# Patient Record
Sex: Male | Born: 1945 | Race: White | Hispanic: No | Marital: Single | Smoking: Former smoker
Health system: Southern US, Community
[De-identification: ages and names within clinical notes are randomized; demographics above are authoritative.]

## PROBLEM LIST (undated history)

## (undated) DIAGNOSIS — I10 Essential (primary) hypertension: Secondary | ICD-10-CM

## (undated) DIAGNOSIS — J449 Chronic obstructive pulmonary disease, unspecified: Secondary | ICD-10-CM

## (undated) DIAGNOSIS — I509 Heart failure, unspecified: Secondary | ICD-10-CM

## (undated) HISTORY — DX: Essential (primary) hypertension: I10

---

## 2010-09-15 DEATH — deceased

## 2015-11-02 IMAGING — US US RENAL
1 series · 14 of 25 positions shown · non-contrast
Comparison: [DATE] chest CT.

CLINICAL DATA: 71-year-old male. Right hydronephrosis versus cyst
noted on recent chest CT. Subsequent encounter.

EXAM:
RENAL ULTRASOUND

[Series 1: us renal · 0.22mm/px · 14 of 40 slices shown]
[im 1/40]
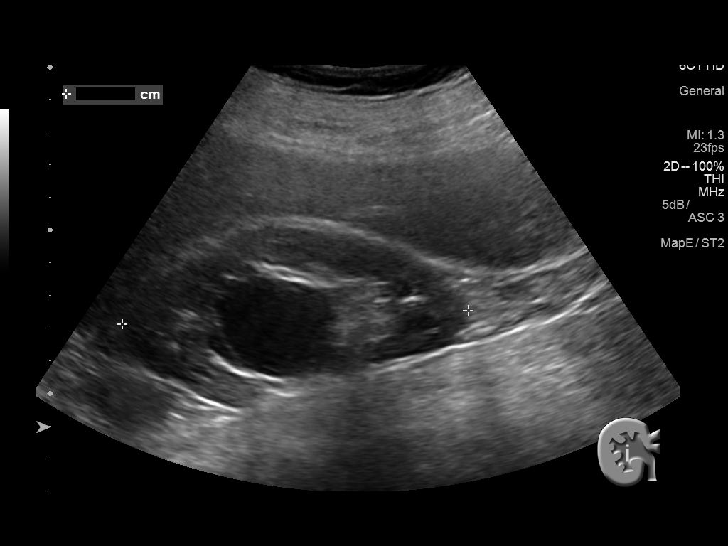
[im 4/40]
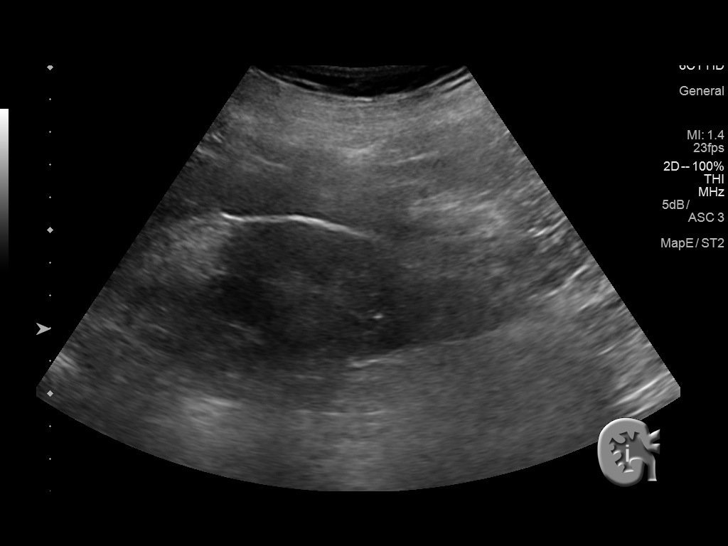
[im 7/40]
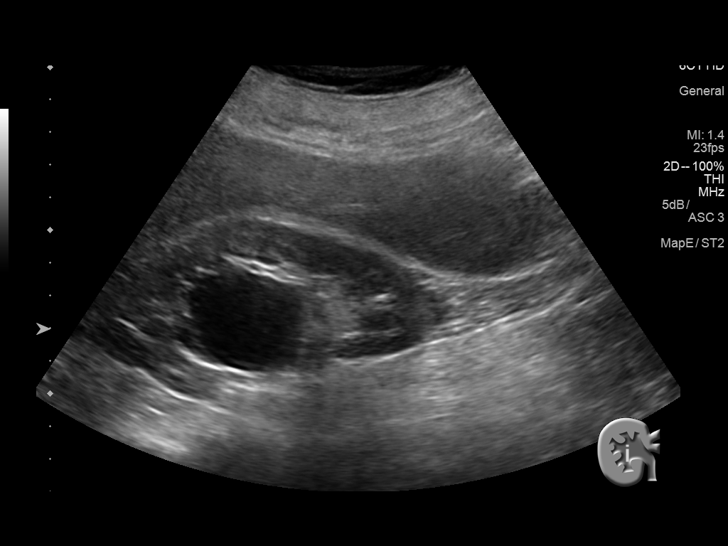
[im 10/40]
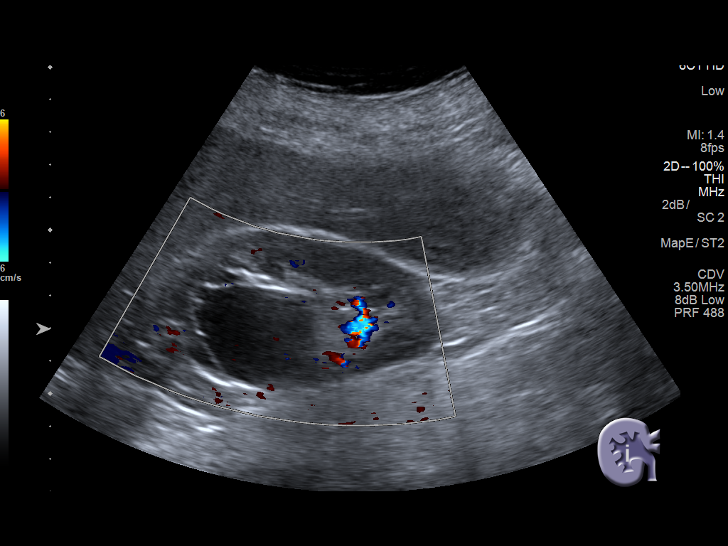
[im 14/40]
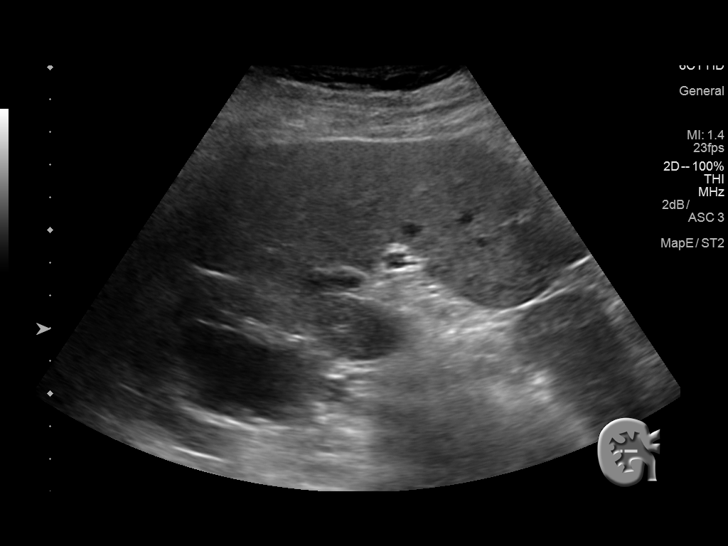
[im 15/40]
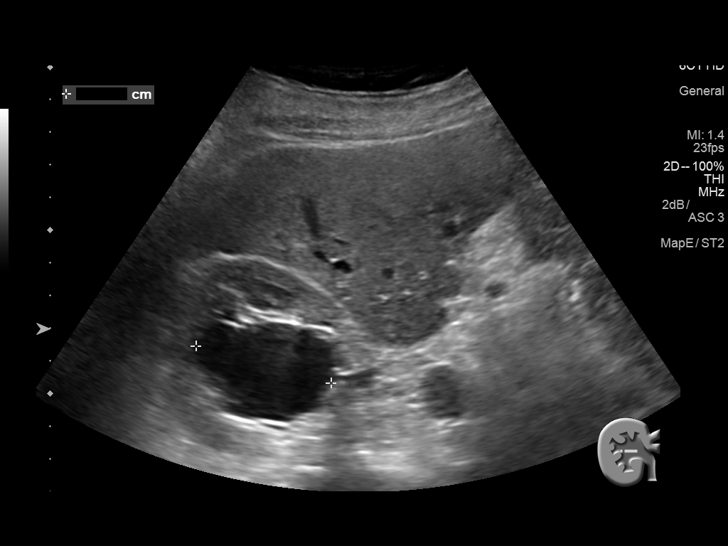
[im 18/40]
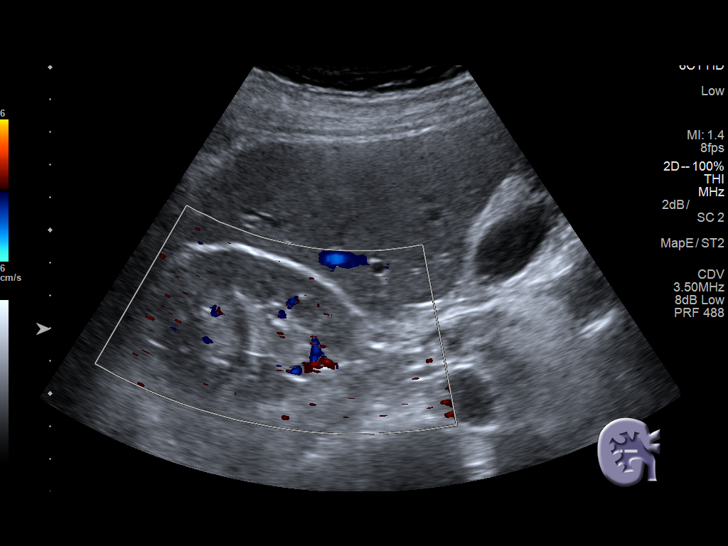
[im 22/40]
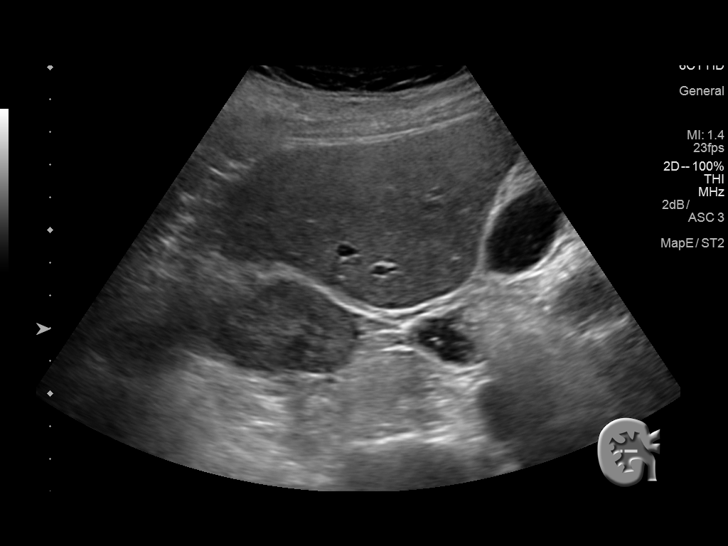
[im 25/40]
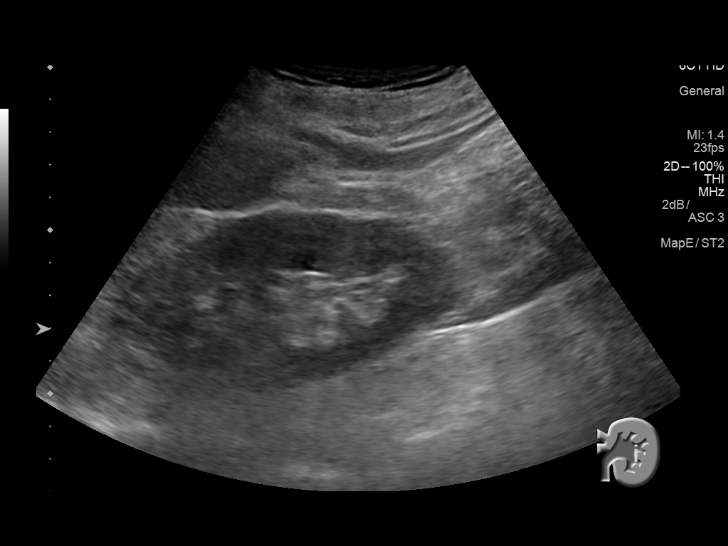
[im 27/40]
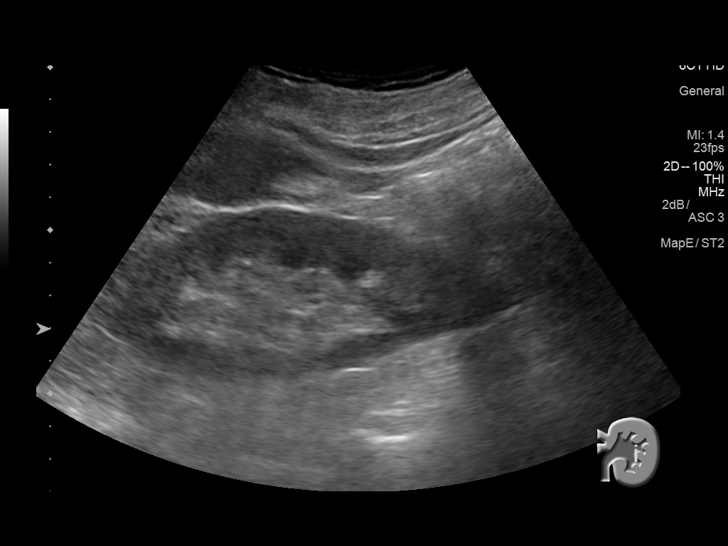
[im 30/40]
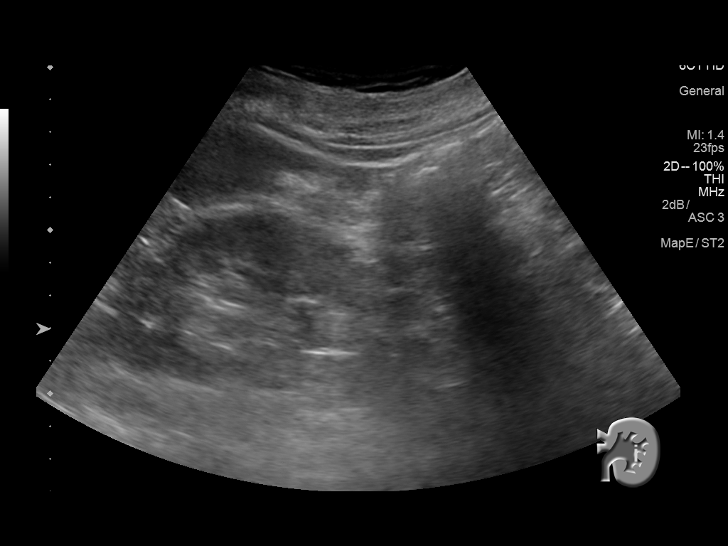
[im 33/40]
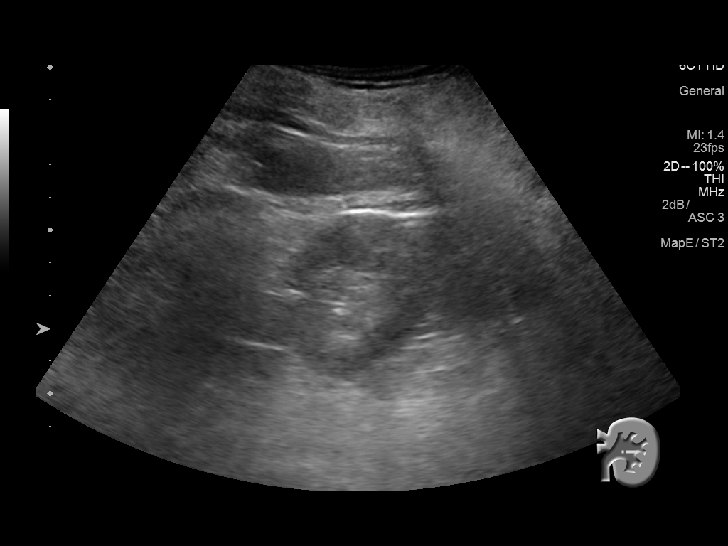
[im 36/40]
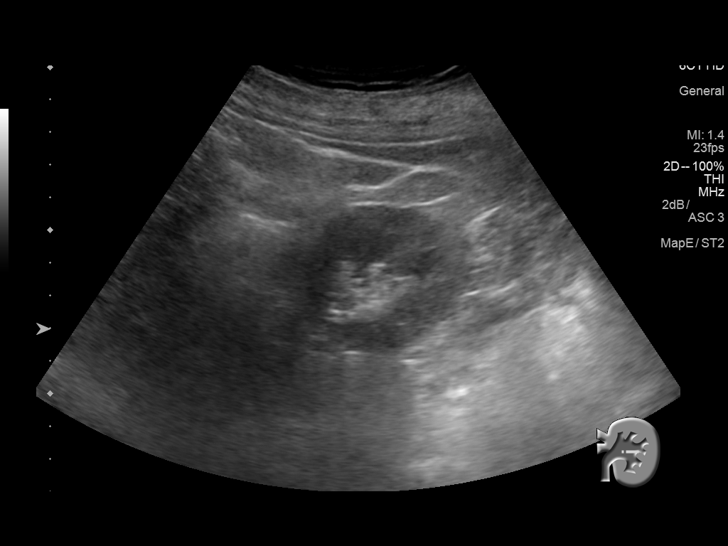
[im 40/40]
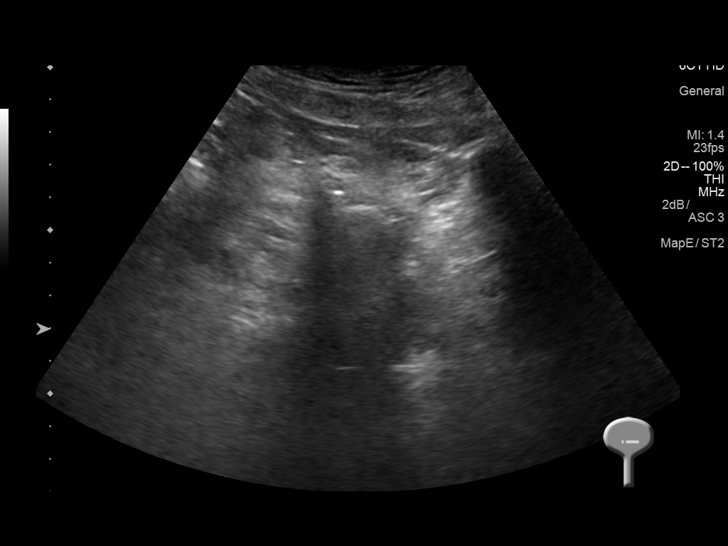

[14 of 25 positions shown; findings below may reference images not displayed]

FINDINGS: Right Kidney:

Length: 10.6 cm. Echogenicity within normal limits. 3.6 x 3 x 4.3 cm
central/parapelvic cyst. No hydronephrosis.

Left Kidney:

Length: 10.5 cm.. Echogenicity within normal limits. No mass or
hydronephrosis visualized.

Bladder:

Bladder not visualized and may be decompressed.
IMPRESSION: 3.6 x 3 x 4.3 cm cyst central aspect right kidney. No
hydronephrosis.

## 2017-07-14 ENCOUNTER — Inpatient Hospital Stay (HOSPITAL_COMMUNITY)
Admission: EM | Admit: 2017-07-14 | Discharge: 2017-07-18 | DRG: 291 | Disposition: A | Discharge status: Home Health | Payer: Medicare Other | Attending: Family Medicine | Admitting: Family Medicine

## 2017-07-14 ENCOUNTER — Encounter (HOSPITAL_COMMUNITY): Payer: Self-pay | Admitting: Nurse Practitioner

## 2017-07-14 ENCOUNTER — Emergency Department (HOSPITAL_COMMUNITY): Payer: Medicare Other

## 2017-07-14 DIAGNOSIS — R0902 Hypoxemia: Secondary | ICD-10-CM | POA: Diagnosis not present

## 2017-07-14 DIAGNOSIS — J449 Chronic obstructive pulmonary disease, unspecified: Secondary | ICD-10-CM | POA: Diagnosis present

## 2017-07-14 DIAGNOSIS — Z6821 Body mass index (BMI) 21.0-21.9, adult: Secondary | ICD-10-CM

## 2017-07-14 DIAGNOSIS — I253 Aneurysm of heart: Secondary | ICD-10-CM | POA: Diagnosis present

## 2017-07-14 DIAGNOSIS — J441 Chronic obstructive pulmonary disease with (acute) exacerbation: Secondary | ICD-10-CM | POA: Diagnosis present

## 2017-07-14 DIAGNOSIS — R630 Anorexia: Secondary | ICD-10-CM | POA: Diagnosis present

## 2017-07-14 DIAGNOSIS — Z825 Family history of asthma and other chronic lower respiratory diseases: Secondary | ICD-10-CM | POA: Diagnosis not present

## 2017-07-14 DIAGNOSIS — J181 Lobar pneumonia, unspecified organism: Secondary | ICD-10-CM | POA: Diagnosis not present

## 2017-07-14 DIAGNOSIS — J9601 Acute respiratory failure with hypoxia: Secondary | ICD-10-CM | POA: Diagnosis present

## 2017-07-14 DIAGNOSIS — Z87891 Personal history of nicotine dependence: Secondary | ICD-10-CM | POA: Diagnosis not present

## 2017-07-14 DIAGNOSIS — J90 Pleural effusion, not elsewhere classified: Secondary | ICD-10-CM | POA: Diagnosis not present

## 2017-07-14 DIAGNOSIS — Z66 Do not resuscitate: Secondary | ICD-10-CM | POA: Diagnosis present

## 2017-07-14 DIAGNOSIS — I5031 Acute diastolic (congestive) heart failure: Secondary | ICD-10-CM | POA: Diagnosis not present

## 2017-07-14 DIAGNOSIS — J440 Chronic obstructive pulmonary disease with acute lower respiratory infection: Secondary | ICD-10-CM | POA: Diagnosis present

## 2017-07-14 DIAGNOSIS — I509 Heart failure, unspecified: Secondary | ICD-10-CM

## 2017-07-14 DIAGNOSIS — J189 Pneumonia, unspecified organism: Secondary | ICD-10-CM | POA: Diagnosis present

## 2017-07-14 DIAGNOSIS — Z8249 Family history of ischemic heart disease and other diseases of the circulatory system: Secondary | ICD-10-CM

## 2017-07-14 DIAGNOSIS — R531 Weakness: Secondary | ICD-10-CM | POA: Diagnosis present

## 2017-07-14 LAB — CBC
HCT: 50.5 % (ref 39.0–52.0)
Hemoglobin: 15.4 g/dL (ref 13.0–17.0)
MCH: 30.4 pg (ref 26.0–34.0)
MCHC: 30.5 g/dL (ref 30.0–36.0)
MCV: 99.8 fL (ref 78.0–100.0)
Platelets: 444 10*3/uL — ABNORMAL HIGH (ref 150–400)
RBC: 5.06 MIL/uL (ref 4.22–5.81)
RDW: 14.7 % (ref 11.5–15.5)
WBC: 11.4 10*3/uL — ABNORMAL HIGH (ref 4.0–10.5)

## 2017-07-14 LAB — BRAIN NATRIURETIC PEPTIDE: B NATRIURETIC PEPTIDE 5: 606 pg/mL — AB (ref 0.0–100.0)

## 2017-07-14 LAB — I-STAT TROPONIN, ED: Troponin i, poc: 0.01 ng/mL (ref 0.00–0.08)

## 2017-07-14 LAB — BASIC METABOLIC PANEL WITH GFR
Anion gap: 8 (ref 5–15)
BUN: 17 mg/dL (ref 6–20)
CO2: 32 mmol/L (ref 22–32)
Calcium: 8.2 mg/dL — ABNORMAL LOW (ref 8.9–10.3)
Chloride: 100 mmol/L — ABNORMAL LOW (ref 101–111)
Creatinine, Ser: 1.13 mg/dL (ref 0.61–1.24)
GFR calc Af Amer: >60 mL/min
GFR calc non Af Amer: >60 mL/min
Glucose, Bld: 128 mg/dL — ABNORMAL HIGH (ref 65–99)
Potassium: 4.7 mmol/L (ref 3.5–5.1)
Sodium: 140 mmol/L (ref 135–145)

## 2017-07-14 LAB — PROCALCITONIN

## 2017-07-14 IMAGING — CR DG CHEST 2V
3 series · 3 of 3 positions shown · non-contrast
Comparison: None.

CLINICAL DATA: Dyspnea

EXAM:
CHEST  2 VIEW

[w chest lat (1 of 2)]
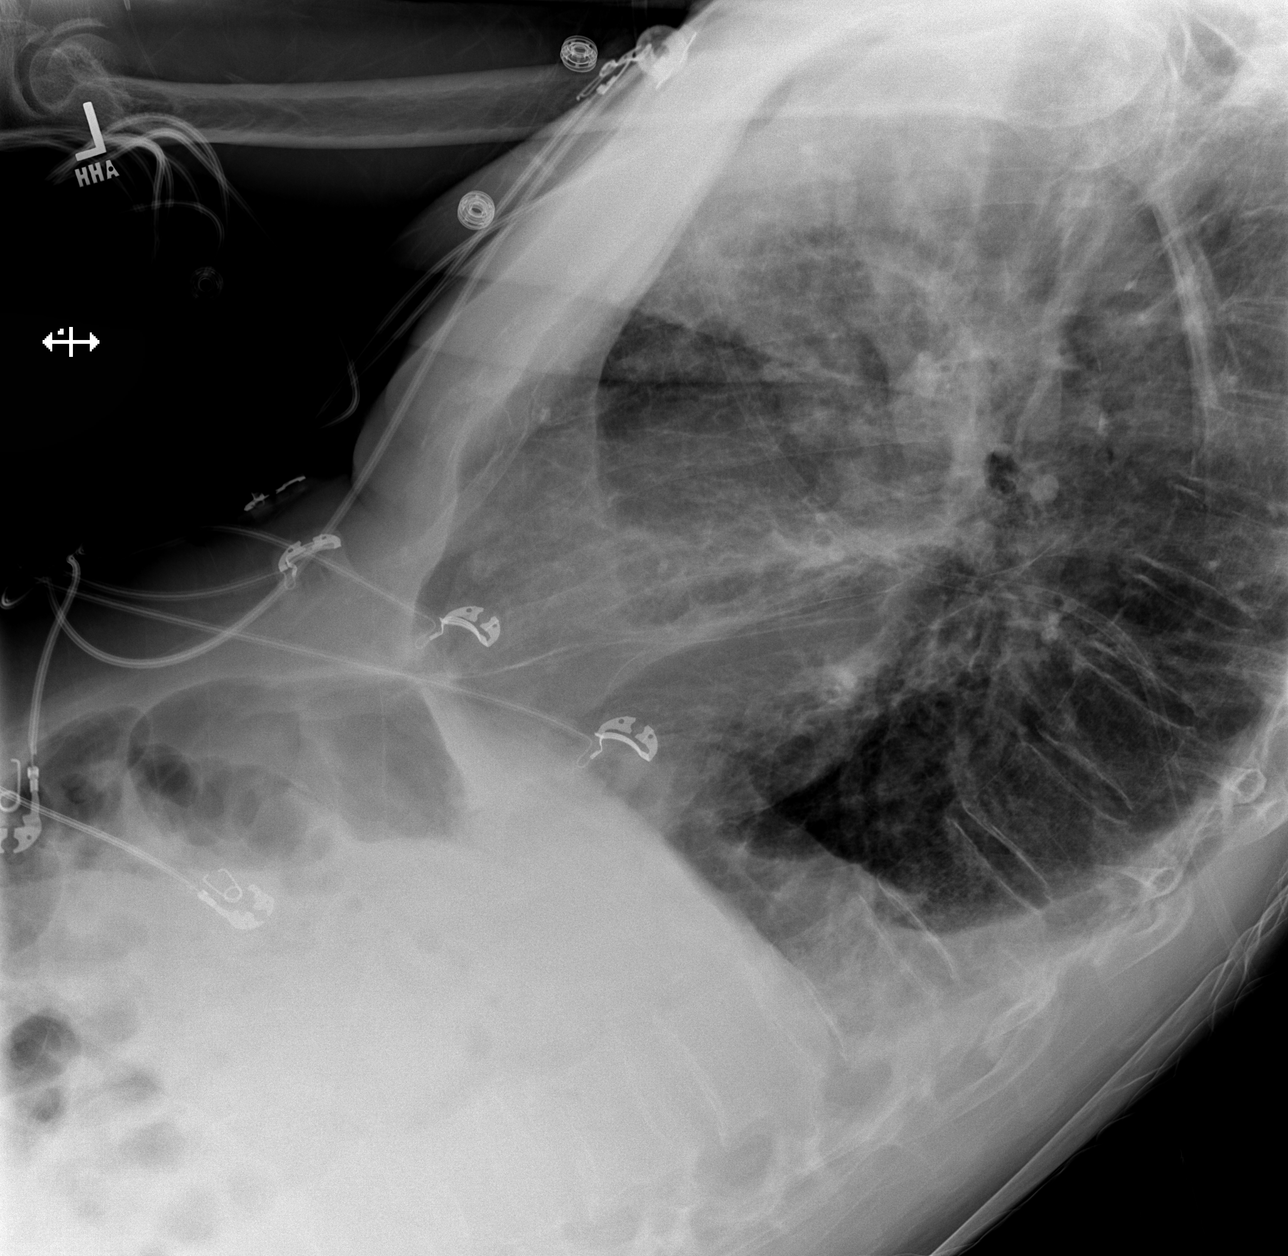

[w chest lat (2 of 2)]
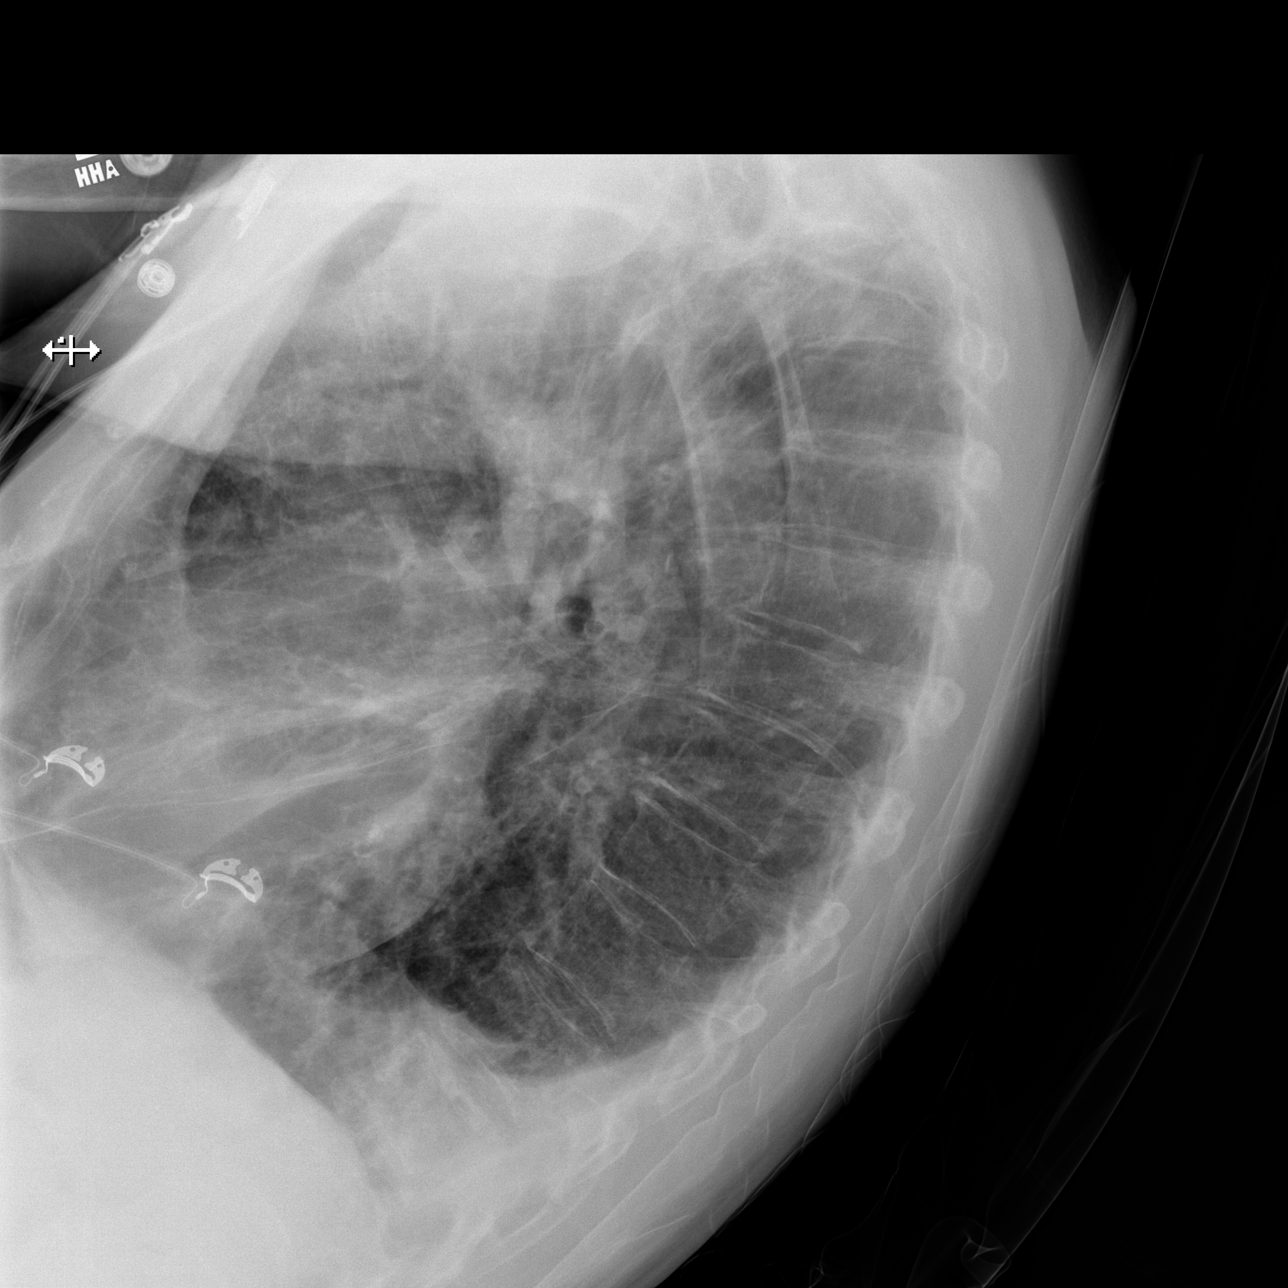

[x chest ap]
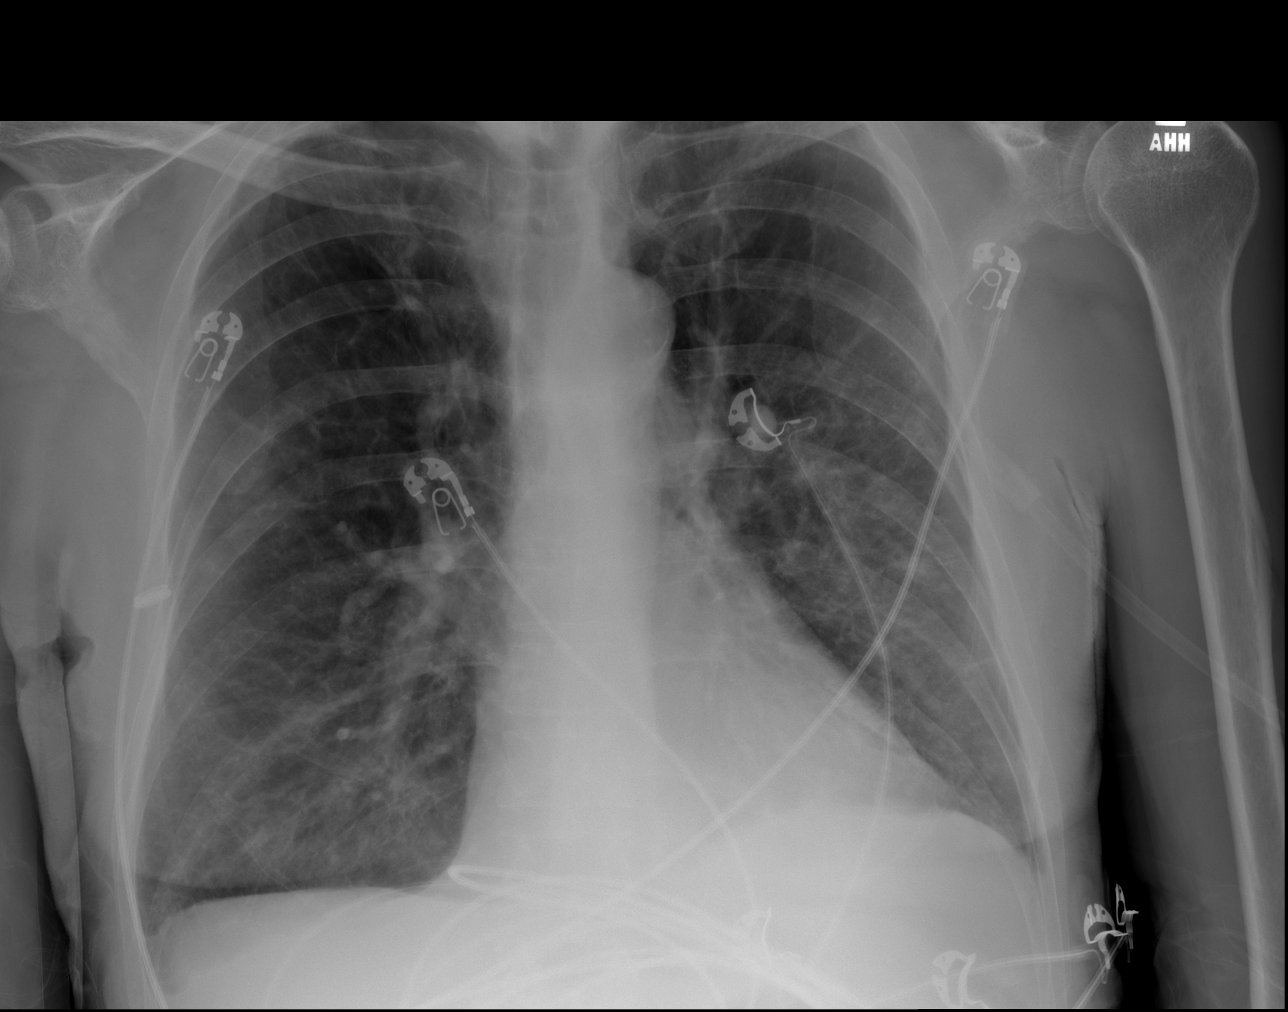

[3 of 3 positions shown; findings below may reference images not displayed]

FINDINGS: Normal heart size. Normal mediastinal contour. No pneumothorax.
Small left pleural effusion. No right pleural effusion. Emphysema.
Hyperinflated lungs. No overt pulmonary edema. Mild patchy left lung
base opacity.
IMPRESSION: 1. Small left pleural effusion.
2. Mild patchy left lung base opacity, cannot exclude aspiration or
pneumonia. Recommend follow-up PA and lateral post treatment chest
radiographs in 4-6 weeks.
3. Emphysema and hyperinflated lungs, suggesting COPD.

## 2017-07-14 MED ORDER — ALBUTEROL SULFATE (2.5 MG/3ML) 0.083% IN NEBU
5.0000 mg | INHALATION_SOLUTION | Freq: Once | RESPIRATORY_TRACT | Status: AC
  Administered 2017-07-14: 5 mg via RESPIRATORY_TRACT
  Filled 2017-07-14: qty 6

## 2017-07-14 MED ORDER — METHYLPREDNISOLONE SODIUM SUCC 125 MG IJ SOLR
60.0000 mg | Freq: Two times a day (BID) | INTRAMUSCULAR | Status: DC
  Administered 2017-07-15 – 2017-07-16 (×3): 60 mg via INTRAVENOUS
  Administered 2017-07-16: 18:00:00 via INTRAVENOUS
  Administered 2017-07-17: 60 mg via INTRAVENOUS
  Filled 2017-07-14 (×5): qty 2

## 2017-07-14 MED ORDER — IPRATROPIUM-ALBUTEROL 0.5-2.5 (3) MG/3ML IN SOLN
3.0000 mL | Freq: Four times a day (QID) | RESPIRATORY_TRACT | Status: DC
  Filled 2017-07-14: qty 3

## 2017-07-14 MED ORDER — DEXTROSE 5 % IV SOLN
1.0000 g | Freq: Once | INTRAVENOUS | Status: AC
  Administered 2017-07-14: 1 g via INTRAVENOUS
  Filled 2017-07-14: qty 10

## 2017-07-14 MED ORDER — DEXTROSE 5 % IV SOLN
1.0000 g | INTRAVENOUS | Status: DC
  Administered 2017-07-15 – 2017-07-17 (×3): 1 g via INTRAVENOUS
  Filled 2017-07-14 (×4): qty 10

## 2017-07-14 MED ORDER — DEXTROSE 5 % IV SOLN
500.0000 mg | INTRAVENOUS | Status: DC
  Administered 2017-07-15: 500 mg via INTRAVENOUS
  Filled 2017-07-14: qty 500

## 2017-07-14 MED ORDER — ENOXAPARIN SODIUM 40 MG/0.4ML ~~LOC~~ SOLN
40.0000 mg | SUBCUTANEOUS | Status: DC
  Administered 2017-07-15 – 2017-07-17 (×3): 40 mg via SUBCUTANEOUS
  Filled 2017-07-14 (×4): qty 0.4

## 2017-07-14 MED ORDER — SODIUM CHLORIDE 0.9 % IV SOLN
INTRAVENOUS | Status: DC
  Administered 2017-07-14 – 2017-07-16 (×2): via INTRAVENOUS

## 2017-07-14 MED ORDER — ALBUTEROL SULFATE (2.5 MG/3ML) 0.083% IN NEBU: 2.5000 mg | INHALATION_SOLUTION | RESPIRATORY_TRACT | Status: DC | PRN

## 2017-07-14 MED ORDER — GUAIFENESIN ER 600 MG PO TB12: 600.0000 mg | ORAL_TABLET | Freq: Two times a day (BID) | ORAL | Status: DC | PRN

## 2017-07-14 MED ORDER — IPRATROPIUM BROMIDE 0.02 % IN SOLN
0.5000 mg | Freq: Once | RESPIRATORY_TRACT | Status: AC
  Administered 2017-07-14: 0.5 mg via RESPIRATORY_TRACT
  Filled 2017-07-14: qty 2.5

## 2017-07-14 MED ORDER — METHYLPREDNISOLONE SODIUM SUCC 125 MG IJ SOLR
80.0000 mg | Freq: Once | INTRAMUSCULAR | Status: AC
  Administered 2017-07-14: 80 mg via INTRAVENOUS
  Filled 2017-07-14: qty 2

## 2017-07-14 MED ORDER — AZITHROMYCIN 500 MG IV SOLR
500.0000 mg | Freq: Once | INTRAVENOUS | Status: AC
  Administered 2017-07-14: 500 mg via INTRAVENOUS
  Filled 2017-07-14: qty 500

## 2017-07-14 MED ORDER — IPRATROPIUM-ALBUTEROL 0.5-2.5 (3) MG/3ML IN SOLN
3.0000 mL | Freq: Four times a day (QID) | RESPIRATORY_TRACT | Status: DC
  Administered 2017-07-14: 3 mL via RESPIRATORY_TRACT
  Filled 2017-07-14: qty 3

## 2017-07-14 NOTE — ED Notes (Signed)
Pt family sts that pt has not been eating and drinking for the past few days

## 2017-07-14 NOTE — ED Notes (Signed)
ED TO INPATIENT HANDOFF REPORT  Name/Age/Gender George Frazier 71 y.o. male  Code Status Code Status History    This patient does not have a recorded code status. Please follow your organizational policy for patients in this situation.      Home/SNF/Other Home  Chief Complaint Shortness of Breath   Level of Care/Admitting Diagnosis ED Disposition    ED Disposition Condition Comment   Admit  Hospital Area: [facility] [[id number]]  Level of Care: Med-Surg [16]  Diagnosis: CAP (community acquired pneumonia) [626948]  Admitting Physician: Karmen Bongo [[id number]]  Attending Physician: Karmen Bongo [[id number]]  Estimated length of stay: 3 - 4 days  Certification:: I certify this patient will need inpatient services for at least 2 midnights  PT Class (Do Not Modify): Inpatient [101]  PT Acc Code (Do Not Modify): Private [1]       Medical History History reviewed. No pertinent past medical history.  Allergies No Known Allergies  IV Location/Drains/Wounds Patient Lines/Drains/Airways Status   Active Line/Drains/Airways    Name:   Placement date:   Placement time:   Site:   Days:   Peripheral IV 07/14/17 Left Antecubital   07/14/17    1717    Antecubital   less than 1          Labs/Imaging Results for orders placed or performed during the hospital encounter of 07/14/17 (from the past 48 hour(s))  Brain natriuretic peptide     Status: Abnormal   Collection Time: 07/14/17  5:11 PM  Result Value Ref Range   B Natriuretic Peptide 606.0 (H) 0.0 - 100.0 pg/mL  Basic metabolic panel     Status: Abnormal   Collection Time: 07/14/17  5:12 PM  Result Value Ref Range   Sodium 140 135 - 145 mmol/L   Potassium 4.7 3.5 - 5.1 mmol/L   Chloride 100 (L) 101 - 111 mmol/L   CO2 32 22 - 32 mmol/L   Glucose, Bld 128 (H) 65 - 99 mg/dL   BUN 17 6 - 20 mg/dL   Creatinine, Ser 1.13 0.61 - 1.24 mg/dL   Calcium 8.2 (L) 8.9 - 10.3 mg/dL   GFR calc non Af Amer >60 >60  mL/min   GFR calc Af Amer >60 >60 mL/min    Comment: (NOTE) The eGFR has been calculated using the CKD EPI equation. This calculation has not been validated in all clinical situations. eGFR's persistently <60 mL/min signify possible Chronic Kidney Disease.    Anion gap 8 5 - 15  CBC     Status: Abnormal   Collection Time: 07/14/17  5:12 PM  Result Value Ref Range   WBC 11.4 (H) 4.0 - 10.5 K/uL   RBC 5.06 4.22 - 5.81 MIL/uL   Hemoglobin 15.4 13.0 - 17.0 g/dL   HCT 50.5 39.0 - 52.0 %   MCV 99.8 78.0 - 100.0 fL   MCH 30.4 26.0 - 34.0 pg   MCHC 30.5 30.0 - 36.0 g/dL   RDW 14.7 11.5 - 15.5 %   Platelets 444 (H) 150 - 400 K/uL  I-stat troponin, ED     Status: None   Collection Time: 07/14/17  5:22 PM  Result Value Ref Range   Troponin i, poc 0.01 0.00 - 0.08 ng/mL   Comment 3            Comment: Due to the release kinetics of cTnI, a negative result within the first hours of the onset of symptoms does not rule out  myocardial infarction with certainty. If myocardial infarction is still suspected, repeat the test at appropriate intervals.    Dg Chest 2 View  Result Date: 07/14/2017 CLINICAL DATA:  Dyspnea EXAM: CHEST  2 VIEW COMPARISON:  None. FINDINGS: Normal heart size. Normal mediastinal contour. No pneumothorax. Small left pleural effusion. No right pleural effusion. Emphysema. Hyperinflated lungs. No overt pulmonary edema. Mild patchy left lung base opacity. IMPRESSION: 1. Small left pleural effusion. 2. Mild patchy left lung base opacity, cannot exclude aspiration or pneumonia. Recommend follow-up PA and lateral post treatment chest radiographs in 4-6 weeks. 3. Emphysema and hyperinflated lungs, suggesting COPD. Electronically Signed   By: Ilona Sorrel M.D.   On: 07/14/2017 18:08    Pending Labs Unresulted Labs (From admission, onward)   None      Vitals/Pain Today's Vitals   07/14/17 1843 07/14/17 1930 07/14/17 1944 07/14/17 2000  BP:  (!) 142/84 (!) 142/84 (!) 143/87   Pulse: 76 81 92 87  Resp: (!) 23 19 (!) 22 (!) 25  Temp:      TempSrc:      SpO2: (!) 89% 100% 100% 97%  Weight:      Height:      PainSc:        Isolation Precautions No active isolations  Medications Medications  azithromycin (ZITHROMAX) 500 mg in dextrose 5 % 250 mL IVPB (500 mg Intravenous New Bag/Given 07/14/17 2013)  albuterol (PROVENTIL) (2.5 MG/3ML) 0.083% nebulizer solution 5 mg (5 mg Nebulization Given 07/14/17 1751)  ipratropium (ATROVENT) nebulizer solution 0.5 mg (0.5 mg Nebulization Given 07/14/17 1751)  methylPREDNISolone sodium succinate (SOLU-MEDROL) 125 mg/2 mL injection 80 mg (80 mg Intravenous Given 07/14/17 1910)  albuterol (PROVENTIL) (2.5 MG/3ML) 0.083% nebulizer solution 5 mg (5 mg Nebulization Given 07/14/17 1911)  ipratropium (ATROVENT) nebulizer solution 0.5 mg (0.5 mg Nebulization Given 07/14/17 1911)  cefTRIAXone (ROCEPHIN) 1 g in dextrose 5 % 50 mL IVPB (0 g Intravenous Stopped 07/14/17 1945)    Mobility walks

## 2017-07-14 NOTE — ED Notes (Signed)
Pt placed on RA and quickly desats to 89%. Pt placed on 3L O2 and returns to 94%. Dr Ashok Cordia made aware

## 2017-07-14 NOTE — ED Provider Notes (Signed)
[facility] DEPT Provider Note   CSN: [id number] Arrival date & time: 07/14/17  1639     History   Chief Complaint Chief Complaint  Patient presents with  . Hypoxia  . Leg Swelling    HPI George Frazier is a 71 y.o. male.  Patient c/o generalized weakness, and progressive sob in the past 3 weeks.  Patient states hasnt been to doctor for years. Has noted increased cough for several months, worse in past 3 weeks. Former smoker. Denies hx asthma or copd, or prior mdi use. Recent poor appetite and generalized weakness, unsure if weight loss. Denies chest pain or discomfort. +bilateral foot/ankle/lower leg edema. Denies hx chf. No fever or chills.    The history is provided by the patient and a relative.    History reviewed. No pertinent past medical history.  There are no active problems to display for this patient.   History reviewed. No pertinent surgical history.     Home Medications    Prior to Admission medications   Not on File    Family History History reviewed. No pertinent family history.  Social History Social History   Tobacco Use  . Smoking status: Former Research scientist (life sciences)  . Smokeless tobacco: Former Network engineer Use Topics  . Alcohol use: No    Frequency: Never  . Drug use: No     Allergies   Patient has no known allergies.   Review of Systems Review of Systems  Constitutional: Negative for fever.  HENT: Negative for sore throat.   Eyes: Negative for redness.  Respiratory: Positive for cough and shortness of breath.   Cardiovascular: Negative for chest pain.  Gastrointestinal: Negative for abdominal pain and vomiting.  Genitourinary: Negative for flank pain.  Musculoskeletal: Negative for back pain.  Skin: Negative for rash.  Neurological: Negative for headaches.  Hematological: Does not bruise/bleed easily.  Psychiatric/Behavioral: Negative for confusion.     Physical Exam Updated Vital Signs BP (!)  141/91   Pulse 76   Temp 98.4 F (36.9 C) (Oral)   Resp (!) 23   Ht 1.829 m (6')   Wt 70.3 kg (155 lb)   SpO2 (!) 89%   BMI 21.02 kg/m   Physical Exam  Constitutional: He appears well-developed and well-nourished. No distress.  HENT:  Mouth/Throat: Oropharynx is clear and moist.  Eyes: Conjunctivae are normal.  Neck: Neck supple. No tracheal deviation present. No thyromegaly present.  Cardiovascular: Normal rate, regular rhythm, normal heart sounds and intact distal pulses.  Pulmonary/Chest: No accessory muscle usage. He is in respiratory distress. He has wheezes. He has rales.  Abdominal: Soft. He exhibits no distension. There is no tenderness.  Genitourinary:  Genitourinary Comments: No cva tenderness  Musculoskeletal: He exhibits no edema.  Neurological: He is alert.  Skin: Skin is warm and dry. He is not diaphoretic.  Psychiatric: He has a normal mood and affect.  Nursing note and vitals reviewed.    ED Treatments / Results  Labs (all labs ordered are listed, but only abnormal results are displayed) Results for orders placed or performed during the hospital encounter of 16/01/09  Basic metabolic panel  Result Value Ref Range   Sodium 140 135 - 145 mmol/L   Potassium 4.7 3.5 - 5.1 mmol/L   Chloride 100 (L) 101 - 111 mmol/L   CO2 32 22 - 32 mmol/L   Glucose, Bld 128 (H) 65 - 99 mg/dL   BUN 17 6 - 20 mg/dL   Creatinine,  Ser 1.13 0.61 - 1.24 mg/dL   Calcium 8.2 (L) 8.9 - 10.3 mg/dL   GFR calc non Af Amer >60 >60 mL/min   GFR calc Af Amer >60 >60 mL/min   Anion gap 8 5 - 15  CBC  Result Value Ref Range   WBC 11.4 (H) 4.0 - 10.5 K/uL   RBC 5.06 4.22 - 5.81 MIL/uL   Hemoglobin 15.4 13.0 - 17.0 g/dL   HCT 50.5 39.0 - 52.0 %   MCV 99.8 78.0 - 100.0 fL   MCH 30.4 26.0 - 34.0 pg   MCHC 30.5 30.0 - 36.0 g/dL   RDW 14.7 11.5 - 15.5 %   Platelets 444 (H) 150 - 400 K/uL  Brain natriuretic peptide  Result Value Ref Range   B Natriuretic Peptide 606.0 (H) 0.0 - 100.0  pg/mL  I-stat troponin, ED  Result Value Ref Range   Troponin i, poc 0.01 0.00 - 0.08 ng/mL   Comment 3           Dg Chest 2 View  Result Date: 07/14/2017 CLINICAL DATA:  Dyspnea EXAM: CHEST  2 VIEW COMPARISON:  None. FINDINGS: Normal heart size. Normal mediastinal contour. No pneumothorax. Small left pleural effusion. No right pleural effusion. Emphysema. Hyperinflated lungs. No overt pulmonary edema. Mild patchy left lung base opacity. IMPRESSION: 1. Small left pleural effusion. 2. Mild patchy left lung base opacity, cannot exclude aspiration or pneumonia. Recommend follow-up PA and lateral post treatment chest radiographs in 4-6 weeks. 3. Emphysema and hyperinflated lungs, suggesting COPD. Electronically Signed   By: Ilona Sorrel M.D.   On: 07/14/2017 18:08    EKG  EKG Interpretation  Date/Time:  Saturday July 14 2017 16:57:57 EST Ventricular Rate:  100 PR Interval:    QRS Duration: 88 QT Interval:  310 QTC Calculation: 400 R Axis:   152 Text Interpretation:  Sinus tachycardia Nonspecific ST abnormality No previous tracing Confirmed by Lajean Saver [phone]) on 07/14/2017 5:39:46 PM       Radiology Dg Chest 2 View  Result Date: 07/14/2017 CLINICAL DATA:  Dyspnea EXAM: CHEST  2 VIEW COMPARISON:  None. FINDINGS: Normal heart size. Normal mediastinal contour. No pneumothorax. Small left pleural effusion. No right pleural effusion. Emphysema. Hyperinflated lungs. No overt pulmonary edema. Mild patchy left lung base opacity. IMPRESSION: 1. Small left pleural effusion. 2. Mild patchy left lung base opacity, cannot exclude aspiration or pneumonia. Recommend follow-up PA and lateral post treatment chest radiographs in 4-6 weeks. 3. Emphysema and hyperinflated lungs, suggesting COPD. Electronically Signed   By: Ilona Sorrel M.D.   On: 07/14/2017 18:08    Procedures Procedures (including critical care time)  Medications Ordered in ED Medications  methylPREDNISolone sodium succinate  (SOLU-MEDROL) 125 mg/2 mL injection 80 mg (not administered)  albuterol (PROVENTIL) (2.5 MG/3ML) 0.083% nebulizer solution 5 mg (not administered)  ipratropium (ATROVENT) nebulizer solution 0.5 mg (not administered)  cefTRIAXone (ROCEPHIN) 1 g in dextrose 5 % 50 mL IVPB (not administered)  azithromycin (ZITHROMAX) 500 mg in dextrose 5 % 250 mL IVPB (not administered)  albuterol (PROVENTIL) (2.5 MG/3ML) 0.083% nebulizer solution 5 mg (5 mg Nebulization Given 07/14/17 1751)  ipratropium (ATROVENT) nebulizer solution 0.5 mg (0.5 mg Nebulization Given 07/14/17 1751)     Initial Impression / Assessment and Plan / ED Course  I have reviewed the triage vital signs and the nursing notes.  Pertinent labs & imaging results that were available during my care of the patient were reviewed by me and considered in  my medical decision making (see chart for details).  Iv ns. Continuous pulse ox and monitor.   Albuterol and atrovent neb.   Persistent wheezing. Solumedrol iv. Albuterol and atrovent.   Patient remains hypoxic on room air with o2 sats in mid to low 80s.  Hospitalists consulted for admission.  Iv abx given for suspected pna.  Given wt loss, prolonged symptoms, ?infiltrate, may need ct r/o underlying lung mass.     Final Clinical Impressions(s) / ED Diagnoses   Final diagnoses:  None    ED Discharge Orders    None       Lajean Saver, MD 07/14/17 1904

## 2017-07-14 NOTE — ED Triage Notes (Signed)
Pt is presented by family in mild distress, they report that pt has been "very sick for the last few weeks/3 weeks but today things got worse." Pt was SATing at low 70%s in triage but compensated to upper 90%s with initial non-rebreather 15L and was shortly weaned down to nasal canula 4L to maintain O2>94. Pt is endorses strong productive colored sputum, skin inspection reveals 1+ pitting edema bilateral lower extremities with a unwell person appearance. He denies chest pain, fever or chills.

## 2017-07-14 NOTE — H&P (Signed)
History and Physical    George Frazier EUM:[id number] DOB: 29-Sep-1945 DOA: 07/14/2017  PCP: Patient, No Pcp Per - last saw a doctor maybe 20-30 years ago Consultants:  None Patient coming from:  Home - lives with alone; NOK: son and daughter, [phone]  Chief Complaint: SOB, cough  HPI: George Frazier is a 71 y.o. male with no medical history since he hasn't seen a doctor in years presenting with  progressive SOB, productive cough.  "The females ganged up on me" so he decided to come in for evaluation.  He has had SOB, poor sleep and PO intake.  Symptoms have been going on for days or months.  +cough, productive of "yucky yellow thick mustard".  No sick contacts.  No fevers.  No weight loss.  No night sweats.  ED Course:   CXR with infiltrate, significant wheezing, pulse ox in 70s in triage, now mid- to low-80s, 90s with [place] O2.  COPD/PNA, possible underlying malignancy.  Mild LE edema with elevated BNP, possible component of CHF.  Review of Systems: As per HPI; otherwise review of systems reviewed and negative.   Ambulatory Status:  Ambulates without assistance  History reviewed. No pertinent past medical history.  History reviewed. No pertinent surgical history.  Social History   Socioeconomic History  . Marital status: Single    Spouse name: Not on file  . Number of children: Not on file  . Years of education: Not on file  . Highest education level: Not on file  Social Needs  . Financial resource strain: Not on file  . Food insecurity - worry: Not on file  . Food insecurity - inability: Not on file  . Transportation needs - medical: Not on file  . Transportation needs - non-medical: Not on file  Occupational History  . Occupation: retired  Tobacco Use  . Smoking status: Former Smoker    Packs/day: 1.00    Years: 40.00    Pack years: 40.00    Last attempt to quit: 2008    Years since quitting: 11.0  . Smokeless tobacco: Former Network engineer and Sexual  Activity  . Alcohol use: No    Frequency: Never  . Drug use: No  . Sexual activity: No  Other Topics Concern  . Not on file  Social History Narrative  . Not on file    No Known Allergies  Family History  Problem Relation Age of Onset  . CAD Mother 31  . COPD Father 9  . AAA (abdominal aortic aneurysm) Father   . Cancer Neg Hx     Prior to Admission medications   Not on File    Physical Exam: Vitals:   07/14/17 1843 07/14/17 1930 07/14/17 1944 07/14/17 2000  BP:  (!) 142/84 (!) 142/84 (!) 143/87  Pulse: 76 81 92 87  Resp: (!) 23 19 (!) 22 (!) 25  Temp:      TempSrc:      SpO2: (!) 89% 100% 100% 97%  Weight:      Height:         General:  Appears calm and comfortable and is NAD; he is unkempt and disheveled Eyes:  PERRL, EOMI, normal lids, iris ENT:  grossly normal hearing, lips & tongue, mmm; edentulous Neck:  no LAD, masses or thyromegaly Cardiovascular:  RRR, no m/r/g. 2+ bipedal edema.  Respiratory:  Poor air movement diffusely with even less air movement in the LLL, occasional wheezing.  Normal to slightly increased respiratory effort. Abdomen:  soft, NT, ND, NABS Back:   normal alignment, no CVAT Skin: His feet are very poorly kept and there is bipedal edema with erythema and mild excoriation along the top of R > L Musculoskeletal:  grossly normal tone BUE/BLE, good ROM, no bony abnormality Lower extremity:  No LE edema.  Limited foot exam with no ulcerations but some dorsal excoriations; they are malodorous and very dirty.  2+ distal pulses. Psychiatric:  grossly normal mood and affect, speech fluent and appropriate, AOx3 Neurologic:  CN 2-12 grossly intact, moves all extremities in coordinated fashion, sensation intact    Radiological Exams on Admission: Dg Chest 2 View  Result Date: 07/14/2017 CLINICAL DATA:  Dyspnea EXAM: CHEST  2 VIEW COMPARISON:  None. FINDINGS: Normal heart size. Normal mediastinal contour. No pneumothorax. Small left pleural  effusion. No right pleural effusion. Emphysema. Hyperinflated lungs. No overt pulmonary edema. Mild patchy left lung base opacity. IMPRESSION: 1. Small left pleural effusion. 2. Mild patchy left lung base opacity, cannot exclude aspiration or pneumonia. Recommend follow-up PA and lateral post treatment chest radiographs in 4-6 weeks. 3. Emphysema and hyperinflated lungs, suggesting COPD. Electronically Signed   By: Ilona Sorrel M.D.   On: 07/14/2017 18:08    EKG: Independently reviewed.  Sinus tachycardia with rate 100; nonspecific ST changes with no evidence of acute ischemia   Labs on Admission: I have personally reviewed the available labs and imaging studies at the time of the admission.  Pertinent labs:   Glucose 128 BNP 606 Troponin 0.01 WBC 11.4  Assessment/Plan Principal Problem:   CAP (community acquired pneumonia) Active Problems:   COPD (chronic obstructive pulmonary disease) (HCC)   Heart failure (HCC)   CAP -Given productive cough, decreased oxygen saturation, and infiltrate in left lower lobe on chest x-ray, most likely community-acquired pneumonia.  - Other etiologies include aspiration (although he does not have apparent risk factors for this; for now will monitor without ordering a swallow evaluation) versus URI (most likely viral); he likely also has a component of chronic COPD and possibly also heart failure (see below) that are contributing to his presentation. -CURB-65 score is 1 - will admit the patient to Med Surg. -Pneumonia Severity Index (PSI) is Class 3, 1% mortality.. -Corticosteroids have been to shown to low overall mortality rate; risk of ARDS; and need for mechanical ventilation.  This is particularly true in severe PNA (class 3+ PSI).  Will add steroids for 3-7 days (currently on Solumedrol BID given probable COPD component). -Will start Azithromycin 500 mg daily AND Rocephin due to no risk factors for MDR cause. -Additional complicating factors include:  hypoxia/hypoxemia;  associated pleural effusion; and prolonged lack of medical care. -NS @ 75cc/hr -Fever control -Repeat CBC in am -Sputum cultures -Blood cultures -Strep pneumo testing -HIV testing -UDS -Will order lower respiratory tract procalcitonin level.  Antibiotics would not be indicated for PCT <0.1 and probably should not be used for < 0.25.  >0.5 indicates infection and >>0.5 indicates more serious disease.  As the procalcitonin level normalizes, it will be reasonable to consider de-escalation of antibiotic coverage. -albuterol PRN -Standing Duonebs -mucinex  COPD -Patient's shortness of breath and productive cough are most likely contributed to by undiagnosed COPD, +/- exacerbation.  -Nebulizers: scheduled Duoneb and prn albuterol -Solu-Medrol 80 mg IV BID  -PT/OT/CM/SW/Nutriton/RT as per COPD order set  Suspected/Possible heart failure -Patient also has LE edema, primarily pedal although family reports facial and hand/arm edema with worse LE edema which has improved -Elevated BNP -This  is more likely chronic in nature but may also be contributing to his current presentation -No chest pain, negative troponin -Will request echocardiogram -Will start ASA if echo is abnormal -Will start Lisinopril if echo is abnormal -Normal kidney function at this time, will follow -Will check TSH, A1c (elevated glucose in ER), and FLP   DVT prophylaxis: Lovenox  Code Status:  DNR - confirmed with patient Family Communication: Daughter, son, and nephew present Disposition Plan:  Home once clinically improved Consults called: CM/SW/PT/OT/Nutrition/RT  Admission status: Admit - It is my clinical opinion that admission to INPATIENT is reasonable and necessary because this patient will require at least 2 midnights in the hospital to treat this condition based on the medical complexity of the problems presented.  Given the aforementioned information, the predictability of an adverse outcome  is felt to be significant.   Karmen Bongo MD [facility]  If note is complete, please contact covering daytime or nighttime physician. [email or web address] Password [facility]  07/14/2017, 8:27 PM

## 2017-07-15 ENCOUNTER — Other Ambulatory Visit: Payer: Self-pay

## 2017-07-15 ENCOUNTER — Inpatient Hospital Stay (HOSPITAL_COMMUNITY): Payer: Medicare Other

## 2017-07-15 DIAGNOSIS — I509 Heart failure, unspecified: Secondary | ICD-10-CM

## 2017-07-15 LAB — BASIC METABOLIC PANEL
ANION GAP: 8 (ref 5–15)
BUN: 18 mg/dL (ref 6–20)
CO2: 30 mmol/L (ref 22–32)
Calcium: 7.7 mg/dL — ABNORMAL LOW (ref 8.9–10.3)
Chloride: 102 mmol/L (ref 101–111)
Creatinine, Ser: 1.01 mg/dL (ref 0.61–1.24)
Glucose, Bld: 219 mg/dL — ABNORMAL HIGH (ref 65–99)
POTASSIUM: 5 mmol/L (ref 3.5–5.1)
SODIUM: 140 mmol/L (ref 135–145)

## 2017-07-15 LAB — RAPID URINE DRUG SCREEN, HOSP PERFORMED
AMPHETAMINES: NOT DETECTED
BARBITURATES: NOT DETECTED
BENZODIAZEPINES: NOT DETECTED
COCAINE: NOT DETECTED
Opiates: NOT DETECTED
TETRAHYDROCANNABINOL: NOT DETECTED

## 2017-07-15 LAB — HEMOGLOBIN A1C
HEMOGLOBIN A1C: 6.1 % — AB (ref 4.8–5.6)
Mean Plasma Glucose: 128.37 mg/dL

## 2017-07-15 LAB — STREP PNEUMONIAE URINARY ANTIGEN: STREP PNEUMO URINARY ANTIGEN: NEGATIVE

## 2017-07-15 LAB — CBC WITH DIFFERENTIAL/PLATELET
BASOS ABS: 0 10*3/uL (ref 0.0–0.1)
Basophils Relative: 0 %
EOS PCT: 0 %
Eosinophils Absolute: 0 10*3/uL (ref 0.0–0.7)
HCT: 46.4 % (ref 39.0–52.0)
Hemoglobin: 13.9 g/dL (ref 13.0–17.0)
LYMPHS PCT: 4 %
Lymphs Abs: 0.3 10*3/uL — ABNORMAL LOW (ref 0.7–4.0)
MCH: 29.6 pg (ref 26.0–34.0)
MCHC: 30 g/dL (ref 30.0–36.0)
MCV: 98.9 fL (ref 78.0–100.0)
Monocytes Absolute: 0.2 10*3/uL (ref 0.1–1.0)
Monocytes Relative: 3 %
Neutro Abs: 7.3 10*3/uL (ref 1.7–7.7)
Neutrophils Relative %: 93 %
PLATELETS: 414 10*3/uL — AB (ref 150–400)
RBC: 4.69 MIL/uL (ref 4.22–5.81)
RDW: 14.4 % (ref 11.5–15.5)
WBC: 7.9 10*3/uL (ref 4.0–10.5)

## 2017-07-15 LAB — LIPID PANEL
CHOL/HDL RATIO: 3.7 ratio
CHOLESTEROL: 127 mg/dL (ref 0–200)
HDL: 34 mg/dL — ABNORMAL LOW (ref >40)
LDL Cholesterol: 79 mg/dL (ref 0–99)
Triglycerides: 72 mg/dL (ref <150)
VLDL: 14 mg/dL (ref 0–40)

## 2017-07-15 LAB — ECHOCARDIOGRAM COMPLETE
HEIGHTINCHES: 72 in
WEIGHTICAEL: 2504.43 [oz_av]

## 2017-07-15 LAB — GLUCOSE, CAPILLARY: Glucose-Capillary: 96 mg/dL (ref 65–99)

## 2017-07-15 LAB — HIV ANTIBODY (ROUTINE TESTING W REFLEX): HIV Screen 4th Generation wRfx: NONREACTIVE

## 2017-07-15 LAB — TSH: TSH: 0.776 u[IU]/mL (ref 0.350–4.500)

## 2017-07-15 MED ORDER — IPRATROPIUM-ALBUTEROL 0.5-2.5 (3) MG/3ML IN SOLN
3.0000 mL | Freq: Two times a day (BID) | RESPIRATORY_TRACT | Status: DC
  Filled 2017-07-15: qty 3

## 2017-07-15 MED ORDER — IPRATROPIUM-ALBUTEROL 0.5-2.5 (3) MG/3ML IN SOLN: 3.0000 mL | RESPIRATORY_TRACT | Status: DC | PRN

## 2017-07-15 NOTE — Progress Notes (Signed)
Pt was lying in bed resting when I arrived. He said he had not requested a Chaplain and said he must have been asleep when asked. Pt's sister-in-law was bedside. She and I visited and she said he has been resting and was pretty sick when he first came in and concluded the request was not intentional. 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 Pt's sister-in-law enjoyed talking about her family. She said her mother is on the 5th floor and she mostly spoke of concerns for her health and plan of care. She shared her faith and wanted prayer for her mother. CH had prayer and further conversation w/pt's sister-in-law. Please page if additional support is needed. [place], MDiv   07/15/17 1600  Clinical Encounter Type  Visited With Patient;Family

## 2017-07-15 NOTE — Progress Notes (Signed)
  Echocardiogram 2D Echocardiogram has been performed.  Merrie Roof F 07/15/2017, 2:03 PM

## 2017-07-15 NOTE — Progress Notes (Signed)
Patient ID: George Frazier, male   DOB: [birth year], 71 y.o.   MRN: [id number]  PROGRESS NOTE    Kaliq Lege  NFA:[id number] DOB: [birth year] DOA: 07/14/2017  PCP: Patient, No Pcp Per   Brief Narrative:  71 y.o. male with no medical history since he hasn't seen a doctor in years presenting with  progressive SOB, productive cough. CXR with infiltrate, significant wheezing, pulse ox in 70s in triage, now mid- to low-80s, 90s with [place] O2.  COPD/PNA, possible underlying malignancy.  Mild LE edema with elevated BNP, possible component of CHF  Assessment & Plan:   Principal Problem:   CAP (community acquired pneumonia) - Patient is on azithromycin and Rocephin - Stable respiratory status - Strep pneumonia pending - Blood cultures pending - Sputum culture pending - HIV antibody pending - Continue current bronchodilators - May need repeat chest x-ray for monitoring the resolution of pneumonia - Please note BNP was slightly elevated at 606, awaiting echo results  Active Problems:   Acute COPD exacerbation - Continue Solu-Medrol, bronchodilators - Continue empiric antibiotics   DVT prophylaxis: Lovenox subcutaneous Code Status: full code  Family Communication:  Family at the bedside Disposition Plan: Home in am   Consultants:   None   Procedures:   Echo 12/29 - pending   Antimicrobials:   Azithromycin and rocephin 12/29 -->   Subjective: No overnight events.  Objective: Vitals:   07/14/17 2049 07/14/17 2111 07/14/17 2300 07/15/17 0435  BP: (!) 161/100  (!) 99/53 137/72  Pulse: 90  84 90  Resp: 20  18 18   Temp: 99.1 F (37.3 C)   98.3 F (36.8 C)  TempSrc: Oral   Oral  SpO2: 98% 96% 95% 92%  Weight: 71 kg (156 lb 8.4 oz)     Height: 6' (1.829 m)       Intake/Output Summary (Last 24 hours) at 07/15/2017 1052 Last data filed at 07/15/2017 0843 Gross per 24 hour  Intake 636.25 ml  Output 675 ml  Net -38.75 ml   Filed Weights   07/14/17 1701 07/14/17  2049  Weight: 70.3 kg (155 lb) 71 kg (156 lb 8.4 oz)    Examination:  General exam: Appears calm and comfortable  Respiratory system: Wheezing in upper and mid lung lobes  Cardiovascular system: S1 & S2 heard, RRR.  Gastrointestinal system: Abdomen is nondistended, soft and nontender. No organomegaly or masses felt. Normal bowel sounds heard. Central nervous system: Alert and oriented. No focal neurological deficits. Extremities: Symmetric 5 x 5 power. Trace lower extremity edema. Skin: No rashes, lesions or ulcers Psychiatry: Judgement and insight appear normal. Mood & affect appropriate.   Data Reviewed: I have personally reviewed following labs and imaging studies  CBC: Recent Labs  Lab 07/14/17 1712 07/15/17 0338  WBC 11.4* 7.9  NEUTROABS  --  7.3  HGB 15.4 13.9  HCT 50.5 46.4  MCV 99.8 98.9  PLT 444* 469*   Basic Metabolic Panel: Recent Labs  Lab 07/14/17 1712 07/15/17 0338  NA 140 140  K 4.7 5.0  CL 100* 102  CO2 32 30  GLUCOSE 128* 219*  BUN 17 18  CREATININE 1.13 1.01  CALCIUM 8.2* 7.7*   GFR: Estimated Creatinine Clearance: 67.4 mL/min (by C-G formula based on SCr of 1.01 mg/dL). Liver Function Tests: No results for input(s): AST, ALT, ALKPHOS, BILITOT, PROT, ALBUMIN in the last 168 hours. No results for input(s): LIPASE, AMYLASE in the last 168 hours. No results for input(s): AMMONIA in the  last 168 hours. Coagulation Profile: No results for input(s): INR, PROTIME in the last 168 hours. Cardiac Enzymes: No results for input(s): CKTOTAL, CKMB, CKMBINDEX, TROPONINI in the last 168 hours. BNP (last 3 results) No results for input(s): PROBNP in the last 8760 hours. HbA1C: Recent Labs    07/15/17 0336  HGBA1C 6.1*   CBG: Recent Labs  Lab 07/15/17 0639  GLUCAP 96   Lipid Profile: Recent Labs    07/15/17 0336  CHOL 127  HDL 34*  LDLCALC 79  TRIG 72  CHOLHDL 3.7   Thyroid Function Tests: Recent Labs    07/15/17 0336  TSH 0.776    Anemia Panel: No results for input(s): VITAMINB12, FOLATE, FERRITIN, TIBC, IRON, RETICCTPCT in the last 72 hours. Urine analysis: No results found for: COLORURINE, APPEARANCEUR, LABSPEC, PHURINE, GLUCOSEU, HGBUR, BILIRUBINUR, KETONESUR, PROTEINUR, UROBILINOGEN, NITRITE, LEUKOCYTESUR Sepsis Labs: @LABRCNTIP (procalcitonin:4,lacticidven:4)   )No results found for this or any previous visit (from the past 240 hour(s)).    Radiology Studies: Dg Chest 2 View  Result Date: 07/14/2017 CLINICAL DATA:  Dyspnea EXAM: CHEST  2 VIEW COMPARISON:  None. FINDINGS: Normal heart size. Normal mediastinal contour. No pneumothorax. Small left pleural effusion. No right pleural effusion. Emphysema. Hyperinflated lungs. No overt pulmonary edema. Mild patchy left lung base opacity. IMPRESSION: 1. Small left pleural effusion. 2. Mild patchy left lung base opacity, cannot exclude aspiration or pneumonia. Recommend follow-up PA and lateral post treatment chest radiographs in 4-6 weeks. 3. Emphysema and hyperinflated lungs, suggesting COPD. Electronically Signed   By: Ilona Sorrel M.D.   On: 07/14/2017 18:08     Scheduled Meds: . enoxaparin (LOVENOX) injection  40 mg Subcutaneous Q24H  . ipratropium-albuterol  3 mL Nebulization QID  . methylPREDNISolone (SOLU-MEDROL) injection  60 mg Intravenous Q12H   Continuous Infusions: . sodium chloride 75 mL/hr at 07/14/17 2146  . azithromycin    . cefTRIAXone (ROCEPHIN)  IV       LOS: 1 day    Time spent: 25 minutes  Greater than 50% of the time spent on counseling and coordinating the care.   Leisa Lenz, MD [facility] Pager [phone]  If 7PM-7AM, please contact night-coverage [email or web address] Password [email or web address] 07/15/2017, 10:52 AM

## 2017-07-15 NOTE — Progress Notes (Signed)
LCSW aware of consult for COPD Gold Protocol Patient does not meet criteria for screening due to having only 1 admission in last 6 months.  COPD gold patients have had 3 or more admissions to hospital in last 6 months.  Will be available if needs arise during hospitalization, please re-consult if needed.   Lane Hacker, MSW Clinical Social Work: [employer]

## 2017-07-16 MED ORDER — FUROSEMIDE 10 MG/ML IJ SOLN
20.0000 mg | Freq: Every day | INTRAMUSCULAR | Status: DC
  Administered 2017-07-16 – 2017-07-18 (×3): 20 mg via INTRAVENOUS
  Filled 2017-07-16 (×3): qty 2

## 2017-07-16 MED ORDER — AZITHROMYCIN 250 MG PO TABS
500.0000 mg | ORAL_TABLET | Freq: Every day | ORAL | Status: DC
  Administered 2017-07-16 – 2017-07-18 (×3): 500 mg via ORAL
  Filled 2017-07-16 (×4): qty 2

## 2017-07-16 NOTE — Progress Notes (Signed)
Nutrition Brief Note  RD consulted via COPD gold protocol.   Pt consuming 100% of meals at this time. No weight loss reported.  Wt Readings from Last 15 Encounters:  07/14/17 156 lb 8.4 oz (71 kg)    Body mass index is 21.23 kg/m. Patient meets criteria for normal based on current BMI.   Current diet order is regular, patient is consuming approximately 100% of meals at this time. Labs and medications reviewed.   No nutrition interventions warranted at this time. If nutrition issues arise, please consult RD.   Clayton Bibles, MS, RD, [place] Dietitian Pager: [phone] After Hours Pager: [phone]

## 2017-07-16 NOTE — Progress Notes (Signed)
Spoke with patient at bedside, daughter present. Patient states he has been in good health up until this event, states he has not been to a doctor in >13yrs. Discussed the importance of having a PCP, will need f/u care after d/c from hospital. His family will help with making appt prior to d/c. Patient states he has medication coverage, is not on any medications currently. Does not need assistance at this time. Patient is independent with ADL's, lives alone, family lives nearby, still drives. Will continue to follow for d/c needs and Oxygen needs. Will need walking sat 24h prior to d/c if d/c on oxygen. [phone]

## 2017-07-16 NOTE — Progress Notes (Signed)
Discharge planning, spoke with patient and family at beside. They are agreeable to [facility] services. No preference for [facility] agency, contacted [facility] for referral. . [phone]

## 2017-07-16 NOTE — Evaluation (Signed)
Occupational Therapy Evaluation Patient Details Name: George Frazier MRN: [id number] DOB: 06/25/1946 Today's Date: 07/16/2017    History of Present Illness Pt is a 71 year old male with no significant history and admitted for acute COPD exacerbation and CAP   Clinical Impression  Pt admitted with COPD exacerbation. Pt currently with functional limitations due to the deficits listed below (see OT Problem List).  Pt will benefit from skilled OT to increase their safety and independence with ADL and functional mobility for ADL to facilitate discharge to venue listed below.      Follow Up Recommendations  Home health OT    Equipment Recommendations  None recommended by OT       Precautions / Restrictions Precautions Precautions: Fall Precaution Comments: monitor sats      Mobility Bed Mobility Overal bed mobility: Needs Assistance Bed Mobility: Supine to Sit     Supine to sit: Min guard;HOB elevated        Transfers Overall transfer level: Needs assistance Equipment used: None Transfers: Sit to/from Stand Sit to Stand: Min guard         General transfer comment: min/guard for safety        ADL either performed or assessed with clinical judgement   ADL Overall ADL's : Needs assistance/impaired Eating/Feeding: Set up;Sitting   Grooming: Set up;Sitting   Upper Body Bathing: Minimal assistance;Sitting   Lower Body Bathing: Sit to/from stand;Cueing for sequencing;Cueing for safety;Minimal assistance   Upper Body Dressing : Sitting;Set up   Lower Body Dressing: Minimal assistance;Sit to/from stand;Cueing for sequencing;Cueing for safety   Toilet Transfer: Min guard;Ambulation;Cueing for sequencing   Toileting- Clothing Manipulation and Hygiene: Supervision/safety;Sit to/from stand         General ADL Comments: edcuated pt regarding role of HH therapy as well as ideas to save energy with ADL activty - will provide handout     Vision Patient Visual  Report: No change from baseline       Perception     Praxis      Pertinent Vitals/Pain Pain Assessment: No/denies pain     Hand Dominance     Extremity/Trunk Assessment Upper Extremity Assessment Upper Extremity Assessment: Generalized weakness   Lower Extremity Assessment Lower Extremity Assessment: Generalized weakness       Communication Communication Communication: No difficulties   Cognition Arousal/Alertness: Awake/alert Behavior During Therapy: WFL for tasks assessed/performed Overall Cognitive Status: Within Functional Limits for tasks assessed                                                Home Living Family/patient expects to be discharged to:: Private residence Living Arrangements: Alone Available Help at Discharge: Family Type of Home: House Home Access: Level entry     Home Layout: One level               Home Equipment: None   Additional Comments: pt lives alone however has been staying with family which requested/suggested he move in with them      Prior Functioning/Environment Level of Independence: Independent                 OT Problem List: Decreased strength;Decreased activity tolerance;Impaired balance (sitting and/or standing);Decreased safety awareness;Cardiopulmonary status limiting activity      OT Treatment/Interventions: Self-care/ADL training;Patient/family education;DME and/or AE instruction    OT Goals(Current goals can  be found in the care plan section) Acute Rehab OT Goals Patient Stated Goal: get well OT Goal Formulation: With patient Time For Goal Achievement: 07/23/17 Potential to Achieve Goals: Good  OT Frequency: Min 2X/week    AM-PAC PT "6 Clicks" Daily Activity     Outcome Measure Help from another person eating meals?: None Help from another person taking care of personal grooming?: A Little Help from another person toileting, which includes using toliet, bedpan, or urinal?: A  Little Help from another person bathing (including washing, rinsing, drying)?: A Little Help from another person to put on and taking off regular upper body clothing?: A Little Help from another person to put on and taking off regular lower body clothing?: A Little 6 Click Score: 19   End of Session Nurse Communication: Mobility status  Activity Tolerance: Patient tolerated treatment well Patient left: in chair;with call bell/phone within reach;with family/visitor present  OT Visit Diagnosis: Unsteadiness on feet (R26.81);Muscle weakness (generalized) (M62.81)                Time: 3532-9924 OT Time Calculation (min): 26 min Charges:  OT General Charges $OT Visit: 1 Visit OT Evaluation $OT Eval Moderate Complexity: 1 Mod OT Treatments $Self Care/Home Management : 8-22 mins G-Codes:     Kari Baars, OT [phone]  Payton Mccallum D 07/16/2017, 2:03 PM

## 2017-07-16 NOTE — Progress Notes (Signed)
Patient ID: George Frazier, male   DOB: April 07, 1946, 71 y.o.   MRN: [id number]  PROGRESS NOTE    Luby Seamans  TWS:[id number] DOB: 09-16-1945 DOA: 07/14/2017  PCP: Patient, No Pcp Per   Brief Narrative:  71 y.o. male with no medical history since he hasn't seen a doctor in years presenting with  progressive SOB, productive cough. CXR with infiltrate, significant wheezing, pulse ox in 70s in triage, now mid- to low-80s, 90s with [place] O2.  COPD/PNA, possible underlying malignancy.  Mild LE edema with elevated BNP, possible component of CHF  Assessment & Plan:   Principal Problem:   CAP (community acquired pneumonia) / Acute respiratory failure with hypoxia  - Continue azithromycin and rocephin - Blood cx pending - Strep pneumonia pending  - HIV non reactive  - Sputum cx pending    Active Problems:   Acute COPD exacerbation - Continue current BD treatment  - Continue solumedrol     Acute diastolic CHF - Please note BNP was slightly elevated at 606. ECHO on this admission showed preserved EF - Will add low dose lasix 20 mg IV daily  - Daily weight and strict intake and output     DVT prophylaxis: Lovenox subQ Code Status: DNR/DNI Family Communication:  Family at the bedside  Disposition Plan: home in am if he feels better     Consultants:   None   Procedures:   Echo 12/29 - pending   Antimicrobials:   Azithromycin and rocephin 12/29 -->   Subjective: No overnight events.   Objective: Vitals:   07/15/17 2014 07/16/17 0312 07/16/17 0315 07/16/17 0515  BP: 134/82 (!) 159/93  (!) 143/86  Pulse: (!) 104 96  83  Resp: 18 (!) 22  18  Temp: 99.1 F (37.3 C) 98.9 F (37.2 C)    TempSrc: Oral Oral    SpO2: 98% (!) 68% 90% 93%  Weight:      Height:        Intake/Output Summary (Last 24 hours) at 07/16/2017 1306 Last data filed at 07/16/2017 0846 Gross per 24 hour  Intake 3188.75 ml  Output -  Net 3188.75 ml   Filed Weights   07/14/17 1701 07/14/17  2049  Weight: 70.3 kg (155 lb) 71 kg (156 lb 8.4 oz)    Physical Exam  Constitutional: Appears well-developed and well-nourished. No distress.  CVS: RRR, S1/S2 + Pulmonary: diminished breath sounds, no wheezing   Abdominal: Soft. BS +,  no distension, tenderness, rebound or guarding.  Musculoskeletal: Normal range of motion. No tenderness. Some edema in LE Lymphadenopathy: No lymphadenopathy noted, cervical, inguinal. Neuro: Alert. Normal reflexes, muscle tone coordination. No cranial nerve deficit. Skin: Skin is warm and dry.   Psychiatric: Normal mood and affect.    Data Reviewed: I have personally reviewed following labs and imaging studies  CBC: Recent Labs  Lab 07/14/17 1712 07/15/17 0338  WBC 11.4* 7.9  NEUTROABS  --  7.3  HGB 15.4 13.9  HCT 50.5 46.4  MCV 99.8 98.9  PLT 444* 001*   Basic Metabolic Panel: Recent Labs  Lab 07/14/17 1712 07/15/17 0338  NA 140 140  K 4.7 5.0  CL 100* 102  CO2 32 30  GLUCOSE 128* 219*  BUN 17 18  CREATININE 1.13 1.01  CALCIUM 8.2* 7.7*   GFR: Estimated Creatinine Clearance: 67.4 mL/min (by C-G formula based on SCr of 1.01 mg/dL). Liver Function Tests: No results for input(s): AST, ALT, ALKPHOS, BILITOT, PROT, ALBUMIN in the last  168 hours. No results for input(s): LIPASE, AMYLASE in the last 168 hours. No results for input(s): AMMONIA in the last 168 hours. Coagulation Profile: No results for input(s): INR, PROTIME in the last 168 hours. Cardiac Enzymes: No results for input(s): CKTOTAL, CKMB, CKMBINDEX, TROPONINI in the last 168 hours. BNP (last 3 results) No results for input(s): PROBNP in the last 8760 hours. HbA1C: Recent Labs    07/15/17 0336  HGBA1C 6.1*   CBG: Recent Labs  Lab 07/15/17 0639  GLUCAP 96   Lipid Profile: Recent Labs    07/15/17 0336  CHOL 127  HDL 34*  LDLCALC 79  TRIG 72  CHOLHDL 3.7   Thyroid Function Tests: Recent Labs    07/15/17 0336  TSH 0.776   Anemia Panel: No results  for input(s): VITAMINB12, FOLATE, FERRITIN, TIBC, IRON, RETICCTPCT in the last 72 hours. Urine analysis: No results found for: COLORURINE, APPEARANCEUR, LABSPEC, PHURINE, GLUCOSEU, HGBUR, BILIRUBINUR, KETONESUR, PROTEINUR, UROBILINOGEN, NITRITE, LEUKOCYTESUR Sepsis Labs: @LABRCNTIP (procalcitonin:4,lacticidven:4)   Recent Results (from the past 240 hour(s))  Culture, blood (routine x 2) Call MD if unable to obtain prior to antibiotics being given     Status: None (Preliminary result)   Collection Time: 07/14/17  8:58 PM  Result Value Ref Range Status   Specimen Description BLOOD RIGHT ANTECUBITAL  Final   Special Requests   Final    BOTTLES DRAWN AEROBIC AND ANAEROBIC Blood Culture adequate volume   Culture   Final    NO GROWTH 1 DAY Performed at [facility], [address], [place], [place] [address]    Report Status PENDING  Incomplete  Culture, blood (routine x 2) Call MD if unable to obtain prior to antibiotics being given     Status: None (Preliminary result)   Collection Time: 07/14/17  8:58 PM  Result Value Ref Range Status   Specimen Description BLOOD RIGHT HAND  Final   Special Requests IN PEDIATRIC BOTTLE Blood Culture adequate volume  Final   Culture   Final    NO GROWTH 1 DAY Performed at [facility], [place] [address], [place], [place] [address]    Report Status PENDING  Incomplete      Radiology Studies: Dg Chest 2 View  Result Date: 07/14/2017 CLINICAL DATA:  Dyspnea EXAM: CHEST  2 VIEW COMPARISON:  None. FINDINGS: Normal heart size. Normal mediastinal contour. No pneumothorax. Small left pleural effusion. No right pleural effusion. Emphysema. Hyperinflated lungs. No overt pulmonary edema. Mild patchy left lung base opacity. IMPRESSION: 1. Small left pleural effusion. 2. Mild patchy left lung base opacity, cannot exclude aspiration or pneumonia. Recommend follow-up PA and lateral post treatment chest radiographs in 4-6 weeks. 3. Emphysema and hyperinflated  lungs, suggesting COPD. Electronically Signed   By: Ilona Sorrel M.D.   On: 07/14/2017 18:08     Scheduled Meds: . azithromycin  500 mg Oral Daily  . enoxaparin (LOVENOX) injection  40 mg Subcutaneous Q24H  . methylPREDNISolone (SOLU-MEDROL) injection  60 mg Intravenous Q12H   Continuous Infusions: . cefTRIAXone (ROCEPHIN)  IV Stopped (07/15/17 1940)     LOS: 2 days    Time spent: 25 minutes  Greater than 50% of the time spent on counseling and coordinating the care.   Leisa Lenz, MD [facility] Pager [phone]  If 7PM-7AM, please contact night-coverage [email or web address] Password [email or web address] 07/16/2017, 1:06 PM

## 2017-07-16 NOTE — Progress Notes (Signed)
PHARMACIST - PHYSICIAN COMMUNICATION DR:   Charlies Silvers CONCERNING: Antibiotic IV to Oral Route Change Policy  RECOMMENDATION: This patient is receiving azithromycin by the intravenous route.  Based on criteria approved by the Pharmacy and Therapeutics Committee, the antibiotic(s) is/are being converted to the equivalent oral dose form(s).   DESCRIPTION: These criteria include:  Patient being treated for a respiratory tract infection, urinary tract infection, cellulitis or clostridium difficile associated diarrhea if on metronidazole  The patient is not neutropenic and does not exhibit a GI malabsorption state  The patient is eating (either orally or via tube) and/or has been taking other orally administered medications for a least 24 hours  The patient is improving clinically and has a Tmax < 100.5  If you have questions about this conversion, please contact the Pharmacy Department  []   [phone])  [facility] PharmD, [place] Pager [phone] 07/16/2017 12:29 PM

## 2017-07-16 NOTE — Progress Notes (Signed)
Patient ambulated to the door from his bathroom (with the NT) and his O2 sat dropped to 68 percent. 3L [place] was reapplied at rest and patient's oxygen went back up to 90 percent on 3L.George Frazier

## 2017-07-16 NOTE — Evaluation (Signed)
Physical Therapy Evaluation Patient Details Name: George Frazier MRN: [id number] DOB: 1945/09/23 Today's Date: 07/16/2017   History of Present Illness  Pt is a 71 year old male with no significant history and admitted for acute COPD exacerbation and CAP  Clinical Impression  Pt admitted with above diagnosis. Pt currently with functional limitations due to the deficits listed below (see PT Problem List).  Pt will benefit from skilled PT to increase their independence and safety with mobility to allow discharge to the venue listed below.  Pt may require supplemental oxygen upon d/c.  Discussed current O2 needs at rest and with exertion  SATURATION QUALIFICATIONS: (This note is used to comply with regulatory documentation for home oxygen)  Patient Saturations on Room Air at Rest = 87%  Patient Saturations on Room Air while Ambulating = N/A  Patient Saturations on 4 Liters of oxygen while Ambulating = 91%  Please briefly explain why patient needs home oxygen: to improve oxygen saturations at rest and during physical activity/exertion such as ambulation.     Follow Up Recommendations Home health PT    Equipment Recommendations  None recommended by PT    Recommendations for Other Services       Precautions / Restrictions Precautions Precautions: Fall Precaution Comments: monitor sats Restrictions Weight Bearing Restrictions: (P) No      Mobility  Bed Mobility Overal bed mobility: Needs Assistance Bed Mobility: Supine to Sit     Supine to sit: Min guard;HOB elevated        Transfers Overall transfer level: Needs assistance Equipment used: None Transfers: Sit to/from Stand Sit to Stand: Min guard         General transfer comment: min/guard for safety  Ambulation/Gait Ambulation/Gait assistance: Min guard Ambulation Distance (Feet): 120 Feet Assistive device: Rolling walker (2 wheeled) Gait Pattern/deviations: Step-through pattern;Decreased stride length      General Gait Details: pt steady however required one seated rest break, SpO2 dropped to 86% on 3L O2 so increased to 4L and SPO2 90-91%  Stairs            Wheelchair Mobility    Modified Rankin (Stroke Patients Only)       Balance                                             Pertinent Vitals/Pain Pain Assessment: No/denies pain    Home Living Family/patient expects to be discharged to:: Private residence Living Arrangements: Alone Available Help at Discharge: Family Type of Home: House Home Access: Level entry     Home Layout: One level Home Equipment: None Additional Comments: pt lives alone however has been staying with family which requested/suggested he move in with them    Prior Function Level of Independence: Independent               Hand Dominance        Extremity/Trunk Assessment        Lower Extremity Assessment Lower Extremity Assessment: Generalized weakness       Communication   Communication: No difficulties  Cognition Arousal/Alertness: Awake/alert Behavior During Therapy: WFL for tasks assessed/performed Overall Cognitive Status: Within Functional Limits for tasks assessed  General Comments      Exercises     Assessment/Plan    PT Assessment Patient needs continued PT services  PT Problem List Decreased strength;Decreased mobility;Decreased activity tolerance;Cardiopulmonary status limiting activity       PT Treatment Interventions Gait training;Therapeutic exercise;DME instruction;Therapeutic activities;Patient/family education;Functional mobility training    PT Goals (Current goals can be found in the Care Plan section)  Acute Rehab PT Goals PT Goal Formulation: With patient Time For Goal Achievement: 07/30/17 Potential to Achieve Goals: Good    Frequency Min 3X/week   Barriers to discharge        Co-evaluation                AM-PAC PT "6 Clicks" Daily Activity  Outcome Measure Difficulty turning over in bed (including adjusting bedclothes, sheets and blankets)?: None Difficulty moving from lying on back to sitting on the side of the bed? : A Little Difficulty sitting down on and standing up from a chair with arms (e.g., wheelchair, bedside commode, etc,.)?: A Little Help needed moving to and from a bed to chair (including a wheelchair)?: A Little Help needed walking in hospital room?: A Little Help needed climbing 3-5 steps with a railing? : A Lot 6 Click Score: 18    End of Session Equipment Utilized During Treatment: Gait belt Activity Tolerance: Patient tolerated treatment well Patient left: in bed;with call bell/phone within reach;with family/visitor present Nurse Communication: Mobility status PT Visit Diagnosis: Difficulty in walking, not elsewhere classified (R26.2)    Time: 9826-4158 PT Time Calculation (min) (ACUTE ONLY): 17 min   Charges:   PT Evaluation $PT Eval Low Complexity: 1 Low     PT G CodesCarmelia Bake, PT, DPT 07/16/2017 Pager: [phone]  York Ram E 07/16/2017, 12:33 PM

## 2017-07-17 ENCOUNTER — Inpatient Hospital Stay (HOSPITAL_COMMUNITY): Payer: Medicare Other

## 2017-07-17 LAB — BASIC METABOLIC PANEL
Anion gap: 6 (ref 5–15)
BUN: 31 mg/dL — AB (ref 6–20)
CHLORIDE: 101 mmol/L (ref 101–111)
CO2: 35 mmol/L — ABNORMAL HIGH (ref 22–32)
CREATININE: 1.07 mg/dL (ref 0.61–1.24)
Calcium: 8.4 mg/dL — ABNORMAL LOW (ref 8.9–10.3)
GFR calc Af Amer: >60 mL/min (ref >60)
Glucose, Bld: 124 mg/dL — ABNORMAL HIGH (ref 65–99)
Potassium: 5 mmol/L (ref 3.5–5.1)
SODIUM: 142 mmol/L (ref 135–145)

## 2017-07-17 LAB — BLOOD GAS, VENOUS
Acid-Base Excess: 7.9 mmol/L — ABNORMAL HIGH (ref 0.0–2.0)
BICARBONATE: 34.7 mmol/L — AB (ref 20.0–28.0)
O2 Saturation: 95.2 %
PATIENT TEMPERATURE: 98.6
PH VEN: 7.397 (ref 7.250–7.430)
pCO2, Ven: 57.6 mmHg (ref 44.0–60.0)
pO2, Ven: 72.2 mmHg — ABNORMAL HIGH (ref 32.0–45.0)

## 2017-07-17 LAB — CBC
HCT: 48.2 % (ref 39.0–52.0)
HEMOGLOBIN: 14.2 g/dL (ref 13.0–17.0)
MCH: 29.3 pg (ref 26.0–34.0)
MCHC: 29.5 g/dL — AB (ref 30.0–36.0)
MCV: 99.6 fL (ref 78.0–100.0)
PLATELETS: 456 10*3/uL — AB (ref 150–400)
RBC: 4.84 MIL/uL (ref 4.22–5.81)
RDW: 14.3 % (ref 11.5–15.5)
WBC: 10.5 10*3/uL (ref 4.0–10.5)

## 2017-07-17 IMAGING — DX DG CHEST 2V
2 series · 2 of 2 positions shown · non-contrast
Comparison: Two-view chest x-ray [DATE].

CLINICAL DATA: Breath.  Chronic smoking history.  Hypoxia.

EXAM:
CHEST  2 VIEW

[chest pa]
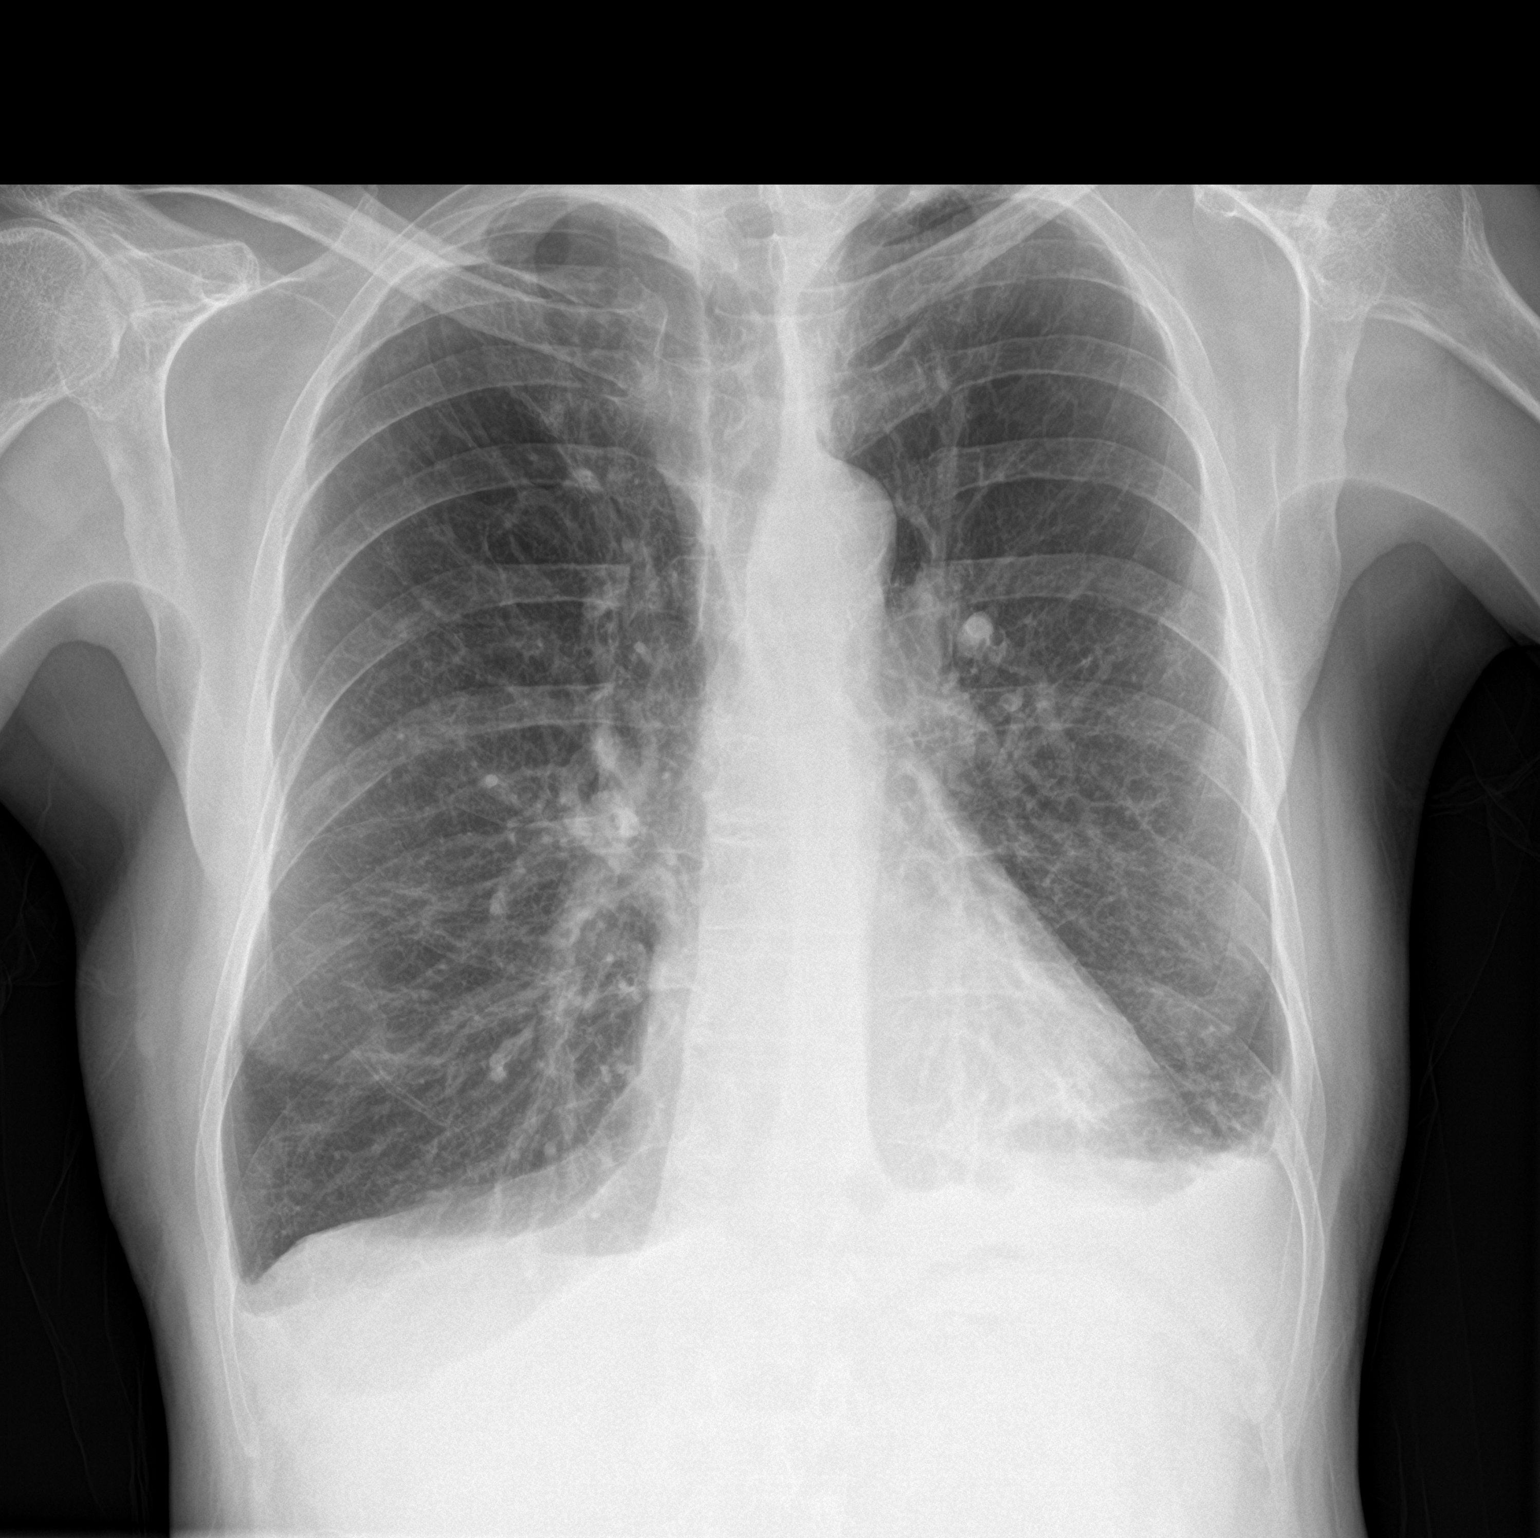

[chest lat]
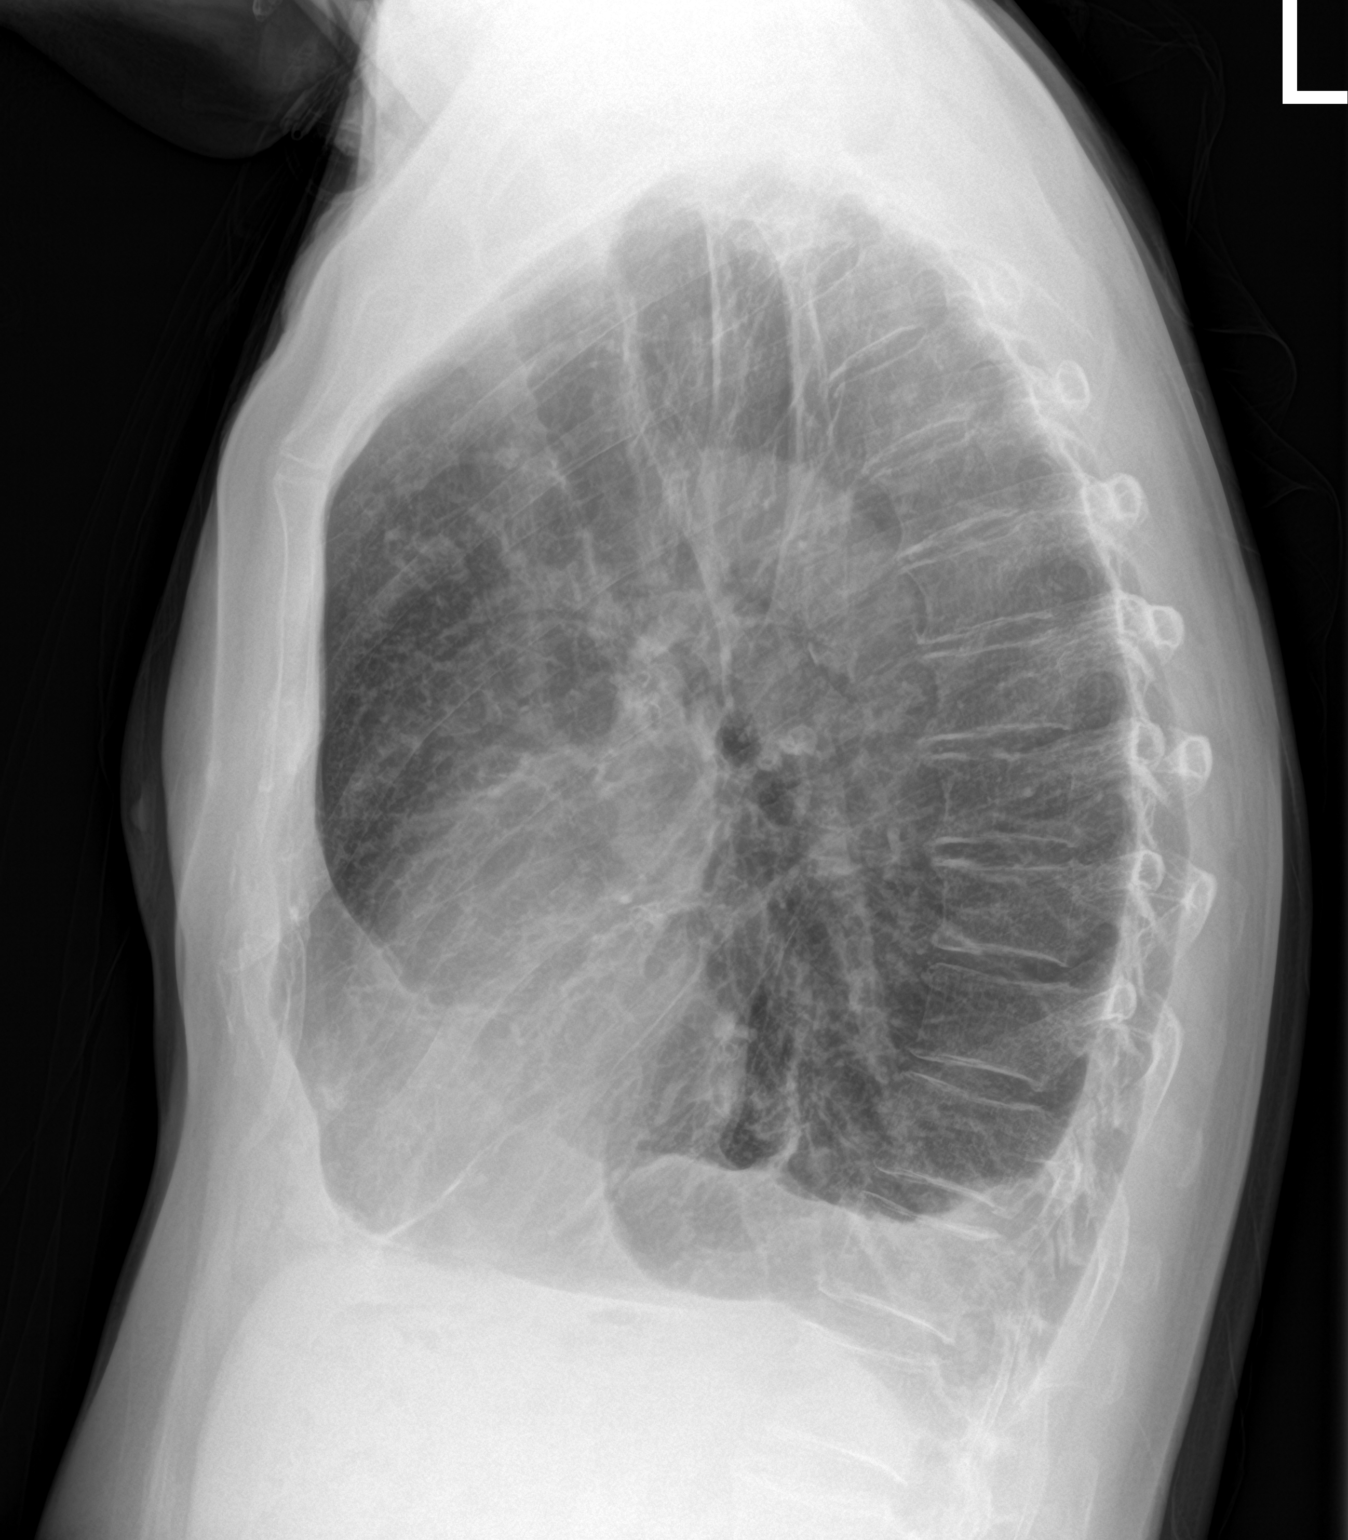

[2 of 2 positions shown; findings below may reference images not displayed]

FINDINGS: Heart size is normal. A new left pleural effusion is present.
Associated left basilar airspace disease is noted. Right lung is
clear. Lungs are hyperexpanded, as before. The visualized soft
tissues and bony thorax are unremarkable.
IMPRESSION: 1. New left pleural effusion and basilar airspace disease,
concerning for pneumonia.
2. COPD.

## 2017-07-17 MED ORDER — METHYLPREDNISOLONE SODIUM SUCC 125 MG IJ SOLR
60.0000 mg | Freq: Two times a day (BID) | INTRAMUSCULAR | Status: AC
  Administered 2017-07-17: 60 mg via INTRAVENOUS
  Filled 2017-07-17: qty 2

## 2017-07-17 MED ORDER — PREDNISONE 20 MG PO TABS
40.0000 mg | ORAL_TABLET | Freq: Every day | ORAL | Status: DC
  Administered 2017-07-18: 40 mg via ORAL
  Filled 2017-07-17: qty 2

## 2017-07-17 NOTE — Progress Notes (Signed)
Occupational Therapy Treatment Patient Details Name: George Frazier MRN: [id number] DOB: [birth year] Today's Date: 07/17/2017    History of present illness Pt is a 72 year old male with no significant history and admitted for acute COPD exacerbation and CAP   OT comments  Pt ambulated with supervision for toileting and one activity standing at sink with supervision. Cued for pursed lip breathing during activity and assist to manage long 02 line. Pt educated in energy conservation strategies and reinforced with handout.  Follow Up Recommendations  Home health OT    Equipment Recommendations  None recommended by OT    Recommendations for Other Services      Precautions / Restrictions Precautions Precautions: Fall Precaution Comments: monitor sats       Mobility Bed Mobility Overal bed mobility: Modified Independent             General bed mobility comments: no assist, HOB up  Transfers Overall transfer level: Needs assistance Equipment used: None Transfers: Sit to/from Stand Sit to Stand: Supervision         General transfer comment: for safety, management of 02 line    Balance                                           ADL either performed or assessed with clinical judgement   ADL Overall ADL's : Needs assistance/impaired     Grooming: Wash/dry hands;Standing;Supervision/safety               Lower Body Dressing: Supervision/safety;Sitting/lateral leans Lower Body Dressing Details (indicate cue type and reason): donned socks without difficulty Toilet Transfer: Supervision/safety;Ambulation;Regular Glass blower/designer Details (indicate cue type and reason): stood to urinate         Functional mobility during ADLs: Supervision/safety(primarily for safety with 02 line) General ADL Comments: Provided energy conservation handout and reviewed. Practiced pursed lip breathing during exertion. Educated pt in fall prevention with 02  line.     Vision       Perception     Praxis      Cognition Arousal/Alertness: Awake/alert Behavior During Therapy: WFL for tasks assessed/performed Overall Cognitive Status: Within Functional Limits for tasks assessed                                          Exercises     Shoulder Instructions       General Comments      Pertinent Vitals/ Pain       Pain Assessment: No/denies pain  Home Living                                          Prior Functioning/Environment              Frequency  Min 2X/week        Progress Toward Goals  OT Goals(current goals can now be found in the care plan section)  Progress towards OT goals: Progressing toward goals  Acute Rehab OT Goals Patient Stated Goal: get well OT Goal Formulation: With patient Time For Goal Achievement: 07/23/17 Potential to Achieve Goals: Good  Plan Discharge plan remains appropriate    Co-evaluation  AM-PAC PT "6 Clicks" Daily Activity     Outcome Measure   Help from another person eating meals?: None Help from another person taking care of personal grooming?: A Little Help from another person toileting, which includes using toliet, bedpan, or urinal?: A Little Help from another person bathing (including washing, rinsing, drying)?: A Little Help from another person to put on and taking off regular upper body clothing?: None Help from another person to put on and taking off regular lower body clothing?: A Little 6 Click Score: 20    End of Session Equipment Utilized During Treatment: Oxygen  OT Visit Diagnosis: Other (comment)(decreased activity tolerance)   Activity Tolerance Patient tolerated treatment well   Patient Left in bed;with call bell/phone within reach;with family/visitor present   Nurse Communication          Time: 1310-1330 OT Time Calculation (min): 20 min  Charges: OT General Charges $OT Visit: 1  Visit OT Treatments $Self Care/Home Management : 8-22 mins  07/17/2017 George Frazier, OTR/L Pager: [place], George Frazier 07/17/2017, 2:01 PM

## 2017-07-17 NOTE — Progress Notes (Signed)
PROGRESS NOTE    George Frazier  NOM:[id number] DOB: 1945-12-17 DOA: 07/14/2017 PCP: Patient, No Pcp Per   Brief Narrative:  72 y.o.malewithnomedical history since he hasn't seen [facility] doctor in years presenting with progressive SOB, productive cough.CXR with infiltrate, significant wheezing, pulse ox in 70s in triage, now mid- to low-80s, 90s with [place] O2. COPD/PNA, possible underlying malignancy. Mild LE edema with elevated BNP, possible component of CHF  Assessment & Plan:   Principal Problem:   CAP (community acquired pneumonia) Active Problems:   COPD (chronic obstructive pulmonary disease) (HCC)   Heart failure (HCC)   CAP (community acquired pneumonia) / Acute respiratory failure with hypoxia  - Continue azithromycin and rocephin - Blood cx NGTD x 2 days - Strep pneumonia negative - HIV non reactive  - Sputum cx pending collection - Continues to require oxygen, will repeat CXR today and continue abx, COPD likely contributing as well. [ ]  Will need f/u CXR in 4-6 weeks per rads for findings    Acute COPD exacerbation - Continue current BD treatment  - Continue solumedrol -> transition to 40 mg daily prednisone tomorrow    Acute diastolic CHF - Please note BNP was slightly elevated at 606. ECHO on this admission showed preserved EF - Will add low dose lasix 20 mg IV daily  - Daily weight and strict intake and output   Pt needs PCP  DVT prophylaxis: lovenox Code Status: DNR Family Communication: son at bedside Disposition Plan: pending improvement, pt with O2 requirement   Consultants:   none  Procedures:   none  Antimicrobials: Anti-infectives (From admission, onward)   Start     Dose/Rate Route Frequency Ordered Stop   07/16/17 1500  azithromycin (ZITHROMAX) tablet 500 mg     500 mg Oral Daily 07/16/17 1231     07/15/17 2000  azithromycin (ZITHROMAX) 500 mg in dextrose 5 % 250 mL IVPB  Status:  Discontinued     500 mg 250 mL/hr over 60 Minutes  Intravenous Every 24 hours 07/14/17 2049 07/16/17 1231   07/15/17 1900  cefTRIAXone (ROCEPHIN) 1 g in dextrose 5 % 50 mL IVPB     1 g 100 mL/hr over 30 Minutes Intravenous Every 24 hours 07/14/17 2049     07/14/17 1900  cefTRIAXone (ROCEPHIN) 1 g in dextrose 5 % 50 mL IVPB     1 g 100 mL/hr over 30 Minutes Intravenous  Once 07/14/17 1859 07/14/17 1945   07/14/17 1900  azithromycin (ZITHROMAX) 500 mg in dextrose 5 % 250 mL IVPB     500 mg 250 mL/hr over 60 Minutes Intravenous  Once 07/14/17 1859 07/14/17 2147     Subjective: Feeling better. Still desating and requiring O2.  Objective: Vitals:   07/16/17 0515 07/16/17 1444 07/16/17 2113 07/17/17 0444  BP: (!) 143/86 (!) 162/98 (!) 166/92 (!) 146/76  Pulse: 83 97 86 77  Resp: 18 18 18 18   Temp:  98.2 F (36.8 C) 98.3 F (36.8 C) 98.4 F (36.9 C)  TempSrc:  Oral Oral Oral  SpO2: 93% 91% 97% 94%  Weight:      Height:        Intake/Output Summary (Last 24 hours) at 07/17/2017 1039 Last data filed at 07/17/2017 0818 Gross per 24 hour  Intake 590 ml  Output -  Net 590 ml   Filed Weights   07/14/17 1701 07/14/17 2049  Weight: 70.3 kg (155 lb) 71 kg (156 lb 8.4 oz)    Examination:  General  exam: Appears calm and comfortable  Respiratory system: Crackles at bases, decreased breath sounds on L Cardiovascular system: S1 & S2 heard, RRR. No JVD, murmurs, rubs, gallops or clicks. Gastrointestinal system: Abdomen is nondistended, soft and nontender. No organomegaly or masses felt. Normal bowel sounds heard. Central nervous system: Alert and oriented. No focal neurological deficits. Extremities: 1+ LEE bilaterally Skin: No rashes, lesions or ulcers Psychiatry: Judgement and insight appear normal. Mood & affect appropriate.     Data Reviewed: I have personally reviewed following labs and imaging studies  CBC: Recent Labs  Lab 07/14/17 1712 07/15/17 0338 07/17/17 0349  WBC 11.4* 7.9 10.5  NEUTROABS  --  7.3  --   HGB 15.4  13.9 14.2  HCT 50.5 46.4 48.2  MCV 99.8 98.9 99.6  PLT 444* 414* 008*   Basic Metabolic Panel: Recent Labs  Lab 07/14/17 1712 07/15/17 0338 07/17/17 0349  NA 140 140 142  K 4.7 5.0 5.0  CL 100* 102 101  CO2 32 30 35*  GLUCOSE 128* 219* 124*  BUN 17 18 31*  CREATININE 1.13 1.01 1.07  CALCIUM 8.2* 7.7* 8.4*   GFR: Estimated Creatinine Clearance: 63.6 mL/min (by C-G formula based on SCr of 1.07 mg/dL). Liver Function Tests: No results for input(s): AST, ALT, ALKPHOS, BILITOT, PROT, ALBUMIN in the last 168 hours. No results for input(s): LIPASE, AMYLASE in the last 168 hours. No results for input(s): AMMONIA in the last 168 hours. Coagulation Profile: No results for input(s): INR, PROTIME in the last 168 hours. Cardiac Enzymes: No results for input(s): CKTOTAL, CKMB, CKMBINDEX, TROPONINI in the last 168 hours. BNP (last 3 results) No results for input(s): PROBNP in the last 8760 hours. HbA1C: Recent Labs    07/15/17 0336  HGBA1C 6.1*   CBG: Recent Labs  Lab 07/15/17 0639  GLUCAP 96   Lipid Profile: Recent Labs    07/15/17 0336  CHOL 127  HDL 34*  LDLCALC 79  TRIG 72  CHOLHDL 3.7   Thyroid Function Tests: Recent Labs    07/15/17 0336  TSH 0.776   Anemia Panel: No results for input(s): VITAMINB12, FOLATE, FERRITIN, TIBC, IRON, RETICCTPCT in the last 72 hours. Sepsis Labs: Recent Labs  Lab 07/14/17 2058  PROCALCITON <0.10    Recent Results (from the past 240 hour(s))  Culture, blood (routine x 2) Call MD if unable to obtain prior to antibiotics being given     Status: None (Preliminary result)   Collection Time: 07/14/17  8:58 PM  Result Value Ref Range Status   Specimen Description BLOOD RIGHT ANTECUBITAL  Final   Special Requests   Final    BOTTLES DRAWN AEROBIC AND ANAEROBIC Blood Culture adequate volume   Culture   Final    NO GROWTH 1 DAY Performed at [facility], [address] [place], [place] [address]    Report Status PENDING   Incomplete  Culture, blood (routine x 2) Call MD if unable to obtain prior to antibiotics being given     Status: None (Preliminary result)   Collection Time: 07/14/17  8:58 PM  Result Value Ref Range Status   Specimen Description BLOOD RIGHT HAND  Final   Special Requests IN PEDIATRIC BOTTLE Blood Culture adequate volume  Final   Culture   Final    NO GROWTH 1 DAY Performed at [facility], [place] [address], [place], [place] [address]    Report Status PENDING  Incomplete  Radiology Studies: No results found.      Scheduled Meds: . azithromycin  500 mg Oral Daily  . enoxaparin (LOVENOX) injection  40 mg Subcutaneous Q24H  . furosemide  20 mg Intravenous Daily  . methylPREDNISolone (SOLU-MEDROL) injection  60 mg Intravenous Q12H   Continuous Infusions: . cefTRIAXone (ROCEPHIN)  IV 1 g (07/16/17 1810)     LOS: 3 days    Time spent: over 30 min    Fayrene Helper, MD [facility] Pager [phone]  If 7PM-7AM, please contact night-coverage [email or web address] Password [email or web address] 07/17/2017, 10:39 AM

## 2017-07-17 NOTE — Care Management Note (Signed)
Case Management Note  Patient Details  Name: George Frazier MRN: [id number] Date of Birth: April 05, 1946  Subjective/Objective:                  CAP (community acquired pneumonia) / Acute respiratory failure with hypoxia    Action/Plan: CM referral for PCP assistance entered 1/1 reviewed. CM provided the patient with the telephone number for [facility] and a list of providers accepting Medicare. Also informed the patient he can call the number on his Medicare card to request a list of providers. He and a family member state his family has been looking for a PCP close to his home. Patient plans to go to a family member's home when he is discharged. He will have access to a handicap accessible shower and ramp. CM spoke with the patient's nurse today and discussed the oxygen readings listed in the chart today. She does not expect the patient to be discharged today due to his O2 sats. Unit CM to continue to follow for discharge needs.   Expected Discharge Date:                  Expected Discharge Plan:  Home with Home Health  In-House Referral:  PCP / [facility]  Discharge planning Services  CM Consult  Post Acute Care Choice:    Choice offered to:     DME Arranged:    DME Agency:     HH Arranged:    HH Agency:     Status of Service:  In process, will continue to follow  If discussed at Long Length of Stay Meetings, dates discussed:    Additional Comments:  Apolonio Schneiders, RN 07/17/2017, 1:39 PM

## 2017-07-17 NOTE — Progress Notes (Signed)
SATURATION QUALIFICATIONS: (This note is used to comply with regulatory documentation for home oxygen)  Patient Saturations on Room Air at Rest = 87%  Patient Saturations on Room Air while Ambulating = 85%  Patient Saturations on 4 Liters of oxygen while Ambulating = 84%  Please briefly explain why patient needs home oxygen: patients o2 saturation is dropping with any type of activity. Pt did not recover well after ambulation.

## 2017-07-18 DIAGNOSIS — I5031 Acute diastolic (congestive) heart failure: Principal | ICD-10-CM

## 2017-07-18 LAB — CBC
HCT: 50.2 % (ref 39.0–52.0)
Hemoglobin: 14.6 g/dL (ref 13.0–17.0)
MCH: 29 pg (ref 26.0–34.0)
MCHC: 29.1 g/dL — ABNORMAL LOW (ref 30.0–36.0)
MCV: 99.8 fL (ref 78.0–100.0)
PLATELETS: 481 10*3/uL — AB (ref 150–400)
RBC: 5.03 MIL/uL (ref 4.22–5.81)
RDW: 14.1 % (ref 11.5–15.5)
WBC: 7.7 10*3/uL (ref 4.0–10.5)

## 2017-07-18 LAB — BASIC METABOLIC PANEL
ANION GAP: 7 (ref 5–15)
BUN: 28 mg/dL — ABNORMAL HIGH (ref 6–20)
CALCIUM: 8.4 mg/dL — AB (ref 8.9–10.3)
CO2: 38 mmol/L — ABNORMAL HIGH (ref 22–32)
Chloride: 97 mmol/L — ABNORMAL LOW (ref 101–111)
Creatinine, Ser: 0.96 mg/dL (ref 0.61–1.24)
GLUCOSE: 115 mg/dL — AB (ref 65–99)
Potassium: 4.8 mmol/L (ref 3.5–5.1)
Sodium: 142 mmol/L (ref 135–145)

## 2017-07-18 MED ORDER — ALBUTEROL SULFATE HFA 108 (90 BASE) MCG/ACT IN AERS: 2.0000 | INHALATION_SPRAY | Freq: Four times a day (QID) | RESPIRATORY_TRACT | 0 refills | Status: DC | PRN

## 2017-07-18 MED ORDER — BUDESONIDE-FORMOTEROL FUMARATE 160-4.5 MCG/ACT IN AERO: 2.0000 | INHALATION_SPRAY | Freq: Two times a day (BID) | RESPIRATORY_TRACT | 0 refills | Status: DC

## 2017-07-18 MED ORDER — GUAIFENESIN ER 600 MG PO TB12: 600.0000 mg | ORAL_TABLET | Freq: Two times a day (BID) | ORAL | 0 refills | Status: DC | PRN

## 2017-07-18 MED ORDER — PREDNISONE 10 MG PO TABS: ORAL_TABLET | ORAL | 0 refills | Status: DC

## 2017-07-18 MED ORDER — AMOXICILLIN-POT CLAVULANATE 875-125 MG PO TABS: 1.0000 | ORAL_TABLET | Freq: Two times a day (BID) | ORAL | 0 refills | Status: AC

## 2017-07-18 NOTE — Progress Notes (Signed)
Discharge instructions reviewed with patient. Patient verbalized understanding. Patient to be discharged via private vehicle. 

## 2017-07-18 NOTE — Discharge Summary (Signed)
Physician Discharge Summary  George Frazier  YOV:[id number]  DOB: May 20, 1946  DOA: 07/14/2017 PCP: Patient, No Pcp Per  Admit date: 07/14/2017 Discharge date: 07/18/2017  Admitted From: Home   Disposition:  Home   Recommendations for Outpatient Follow-up:  1. Follow up with PCP in 1-2 weeks 2. Please obtain BMP/CBC in one week 3. Please repeat CXR PA and lateral in 4-6 weeks to assure resolutions of CXR findings  4. Obtain PFT's  Home Health: PT/OT, aide  Equipment/Devices: O2 2L [place]    Discharge Condition: Stable  CODE STATUS: Full code  Diet recommendation: Heart Healthy   Brief/Interim Summary: For full details see H&P/Progress notes but in brief George Frazier is a 72 y.o.malewithnomedical history since he hasn't seen a doctor in years presenting with progressive SOB, productive cough.CXR with infiltrate, significant wheezing, pulse ox in 70s in triage, with good improvement after O2 supplementation. Patient was admitted with PNA and COPD, with possible new CHF component. Patient was treated with empiric antibiotics and diuresis. Patient subsequently clinically improved but was not able to wean off oxygen. Patient was evaluated by PT whom recommended home health PT. Patient was stable to continue medical treatment at home with antibiotics and short course of prednisone.   Subjective: Patient seen and examined, sitting up in bed, doing well. Report his breathing has improve. No acute events overnight. Denies chest pain and dizziness.   Discharge Diagnoses/Hospital Course:  Principal Problem:   CAP (community acquired pneumonia) Active Problems:   COPD (chronic obstructive pulmonary disease) ([place])   Heart failure (HCC)  CAP (community acquired pneumonia) / Acute respiratory failure with hypoxia  - Initially treated azithromycin and rocephin, will discharge on Augmentin to complete 10 days of therapy  - Blood cx NGTD x 2 days - Strep pneumonia negative - HIV non  reactive  - COPD likely contributing to oxygen requirement - CXR performed 07/17/17 show small left pleural effusion, no need for tap at this time, this is due to infectious process  - Follow up CXR in 4-6 weeks  - Continue steroid taper   Acute COPD exacerbation - No official diagnosis with PFT's, but clinically correlates  - Treated with nebulizer and IV steroids  - Continue Prednisone taper for 14 days  - Will start Dulera for now, until PFT's are obtained  - Continue O2 supplementation, ok with O2 sats between 88-92%  - Wean O2 as tolerated   Acute diastolic CHF - BNP was slightly elevated at 606. ECHO on this admission showed preserved EF - Was treated with low dose IV Lasix - will hold on this as patient is clinically dry  - Follow up as outpatient   All other chronic medical condition were stable during the hospitalization.  Patient was seen by physical therapy, home health PT  On the day of the discharge the patient's vitals were stable, and no other acute medical condition were reported by patient. the patient was felt safe to be discharge to home   Discharge Instructions  You were cared for by a hospitalist during your hospital stay. If you have any questions about your discharge medications or the care you received while you were in the hospital after you are discharged, you can call the unit and asked to speak with the hospitalist on call if the hospitalist that took care of you is not available. Once you are discharged, your primary care physician will handle any further medical issues. Please note that NO REFILLS for any discharge medications  will be authorized once you are discharged, as it is imperative that you return to your primary care physician (or establish a relationship with a primary care physician if you do not have one) for your aftercare needs so that they can reassess your need for medications and monitor your lab values.  Discharge Instructions    Call MD  for:  difficulty breathing, headache or visual disturbances   Complete by:  As directed    Call MD for:  extreme fatigue   Complete by:  As directed    Call MD for:  hives   Complete by:  As directed    Call MD for:  persistant dizziness or light-headedness   Complete by:  As directed    Call MD for:  persistant nausea and vomiting   Complete by:  As directed    Call MD for:  redness, tenderness, or signs of infection (pain, swelling, redness, odor or green/yellow discharge around incision site)   Complete by:  As directed    Call MD for:  severe uncontrolled pain   Complete by:  As directed    Call MD for:  temperature >100.4   Complete by:  As directed    Diet - low sodium heart healthy   Complete by:  As directed    Increase activity slowly   Complete by:  As directed      Allergies as of 07/18/2017   No Known Allergies     Medication List    TAKE these medications   albuterol 108 (90 Base) MCG/ACT inhaler Commonly known as:  PROVENTIL HFA;VENTOLIN HFA Inhale 2 puffs into the lungs every 6 (six) hours as needed for wheezing or shortness of breath.   amoxicillin-clavulanate 875-125 MG tablet Commonly known as:  AUGMENTIN Take 1 tablet by mouth 2 (two) times daily for 7 days.   budesonide-formoterol 160-4.5 MCG/ACT inhaler Commonly known as:  SYMBICORT Inhale 2 puffs into the lungs 2 (two) times daily at 10 AM and 5 PM.   guaiFENesin 600 MG 12 hr tablet Commonly known as:  MUCINEX Take 1 tablet (600 mg total) by mouth 2 (two) times daily as needed for cough or to loosen phlegm.   predniSONE 10 MG tablet Commonly known as:  DELTASONE Take 4 tablets for 3 days; Take 3 tablets for 4 days; Take 2 tablets for 3 days; Take 1 tablet for 4 days            Durable Medical Equipment  (From admission, onward)        Start     Ordered   07/18/17 1210  DME Oxygen  Once    Question Answer Comment  Mode or (Route) Nasal cannula   Liters per Minute 2   Frequency  Continuous (stationary and portable oxygen unit needed)   Oxygen delivery system Gas      07/18/17 1210     Follow-up Information    [place], [place] Follow up.   Specialty:  Home Health Services Why:  physical therapy, occupational therapy, and aide Contact information: [place] [address] [phone]          No Known Allergies  Consultations:  None   Procedures/Studies: Dg Chest 2 View  Result Date: 07/17/2017 CLINICAL DATA:  Breath.  Chronic smoking history.  Hypoxia. EXAM: CHEST  2 VIEW COMPARISON:  Two-view chest x-ray 07/14/2017. FINDINGS: Heart size is normal. A new left pleural effusion is present. Associated left basilar airspace  disease is noted. Right lung is clear. Lungs are hyperexpanded, as before. The visualized soft tissues and bony thorax are unremarkable. IMPRESSION: 1. New left pleural effusion and basilar airspace disease, concerning for pneumonia. 2. COPD. Electronically Signed   By: San Morelle M.D.   On: 07/17/2017 16:35   Dg Chest 2 View  Result Date: 07/14/2017 CLINICAL DATA:  Dyspnea EXAM: CHEST  2 VIEW COMPARISON:  None. FINDINGS: Normal heart size. Normal mediastinal contour. No pneumothorax. Small left pleural effusion. No right pleural effusion. Emphysema. Hyperinflated lungs. No overt pulmonary edema. Mild patchy left lung base opacity. IMPRESSION: 1. Small left pleural effusion. 2. Mild patchy left lung base opacity, cannot exclude aspiration or pneumonia. Recommend follow-up PA and lateral post treatment chest radiographs in 4-6 weeks. 3. Emphysema and hyperinflated lungs, suggesting COPD. Electronically Signed   By: Ilona Sorrel M.D.   On: 07/14/2017 18:08    ECHO 07/15/17 ------------------------------------------------------------------- Study Conclusions  - Left ventricle: The cavity size was normal. Wall thickness was   normal. Systolic function was normal. The estimated ejection    fraction was in the range of 55% to 60%. Wall motion was normal;   there were no regional wall motion abnormalities. Doppler   parameters are consistent with abnormal left ventricular   relaxation (grade 1 diastolic dysfunction). - Left atrium: The atrium was mildly dilated. - Atrial septum: There was an atrial septal aneurysm.  Discharge Exam: Vitals:   07/17/17 2136 07/18/17 0509  BP: (!) 160/97 140/87  Pulse: 88 81  Resp: 17 19  Temp: 98.6 F (37 C) 98.4 F (36.9 C)  SpO2: 93% 95%   Vitals:   07/17/17 0444 07/17/17 1432 07/17/17 2136 07/18/17 0509  BP: (!) 146/76 (!) 168/99 (!) 160/97 140/87  Pulse: 77 91 88 81  Resp: 18 18 17 19   Temp: 98.4 F (36.9 C) 99.2 F (37.3 C) 98.6 F (37 C) 98.4 F (36.9 C)  TempSrc: Oral Oral Oral Oral  SpO2: 94% 95% 93% 95%  Weight:      Height:        General: Pt is alert, awake, not in acute distress Cardiovascular: RRR, S1/S2 +, no rubs, no gallops Respiratory: breath sounds diminished bibasilar, no wheezing  Abdominal: Soft, NT, ND, bowel sounds + Extremities: no edema.  The results of significant diagnostics from this hospitalization (including imaging, microbiology, ancillary and laboratory) are listed below for reference.     Microbiology: Recent Results (from the past 240 hour(s))  Culture, blood (routine x 2) Call MD if unable to obtain prior to antibiotics being given     Status: None (Preliminary result)   Collection Time: 07/14/17  8:58 PM  Result Value Ref Range Status   Specimen Description BLOOD RIGHT ANTECUBITAL  Final   Special Requests   Final    BOTTLES DRAWN AEROBIC AND ANAEROBIC Blood Culture adequate volume   Culture   Final    NO GROWTH 2 DAYS Performed at [facility], [address] [place], [place] [address]    Report Status PENDING  Incomplete  Culture, blood (routine x 2) Call MD if unable to obtain prior to antibiotics being given     Status: None (Preliminary result)   Collection Time:  07/14/17  8:58 PM  Result Value Ref Range Status   Specimen Description BLOOD RIGHT HAND  Final   Special Requests IN PEDIATRIC BOTTLE Blood Culture adequate volume  Final   Culture   Final    NO GROWTH 2  DAYS Performed at [facility], [place] [address] [place], [place] [address]    Report Status PENDING  Incomplete     Labs: BNP (last 3 results) Recent Labs    07/14/17 1711  BNP 998.3*   Basic Metabolic Panel: Recent Labs  Lab 07/14/17 1712 07/15/17 0338 07/17/17 0349 07/18/17 0411  NA 140 140 142 142  K 4.7 5.0 5.0 4.8  CL 100* 102 101 97*  CO2 32 30 35* 38*  GLUCOSE 128* 219* 124* 115*  BUN 17 18 31* 28*  CREATININE 1.13 1.01 1.07 0.96  CALCIUM 8.2* 7.7* 8.4* 8.4*   Liver Function Tests: No results for input(s): AST, ALT, ALKPHOS, BILITOT, PROT, ALBUMIN in the last 168 hours. No results for input(s): LIPASE, AMYLASE in the last 168 hours. No results for input(s): AMMONIA in the last 168 hours. CBC: Recent Labs  Lab 07/14/17 1712 07/15/17 0338 07/17/17 0349 07/18/17 0411  WBC 11.4* 7.9 10.5 7.7  NEUTROABS  --  7.3  --   --   HGB 15.4 13.9 14.2 14.6  HCT 50.5 46.4 48.2 50.2  MCV 99.8 98.9 99.6 99.8  PLT 444* 414* 456* 481*   Cardiac Enzymes: No results for input(s): CKTOTAL, CKMB, CKMBINDEX, TROPONINI in the last 168 hours. BNP: Invalid input(s): POCBNP CBG: Recent Labs  Lab 07/15/17 0639  GLUCAP 96   D-Dimer No results for input(s): DDIMER in the last 72 hours. Hgb A1c No results for input(s): HGBA1C in the last 72 hours. Lipid Profile No results for input(s): CHOL, HDL, LDLCALC, TRIG, CHOLHDL, LDLDIRECT in the last 72 hours. Thyroid function studies No results for input(s): TSH, T4TOTAL, T3FREE, THYROIDAB in the last 72 hours.  Invalid input(s): FREET3 Anemia work up No results for input(s): VITAMINB12, FOLATE, FERRITIN, TIBC, IRON, RETICCTPCT in the last 72 hours. Urinalysis No results found for: COLORURINE, APPEARANCEUR, [place],  [place], [place], [place], [place], [employer], PROTEINUR, UROBILINOGEN, NITRITE, LEUKOCYTESUR Sepsis Labs Invalid input(s): PROCALCITONIN,  WBC,  LACTICIDVEN Microbiology Recent Results (from the past 240 hour(s))  Culture, blood (routine x 2) Call MD if unable to obtain prior to antibiotics being given     Status: None (Preliminary result)   Collection Time: 07/14/17  8:58 PM  Result Value Ref Range Status   Specimen Description BLOOD RIGHT ANTECUBITAL  Final   Special Requests   Final    BOTTLES DRAWN AEROBIC AND ANAEROBIC Blood Culture adequate volume   Culture   Final    NO GROWTH 2 DAYS Performed at [facility], [address], [place], [place] [address]    Report Status PENDING  Incomplete  Culture, blood (routine x 2) Call MD if unable to obtain prior to antibiotics being given     Status: None (Preliminary result)   Collection Time: 07/14/17  8:58 PM  Result Value Ref Range Status   Specimen Description BLOOD RIGHT HAND  Final   Special Requests IN PEDIATRIC BOTTLE Blood Culture adequate volume  Final   Culture   Final    NO GROWTH 2 DAYS Performed at [facility], [place] [address] [place], [place] [address]    Report Status PENDING  Incomplete     Time coordinating discharge:  35 minutes  SIGNED:  Chipper Oman, MD  [facility] 07/18/2017, 12:11 PM  Pager please text page via  [email or web address] Password [email or web address]

## 2017-07-20 LAB — CULTURE, BLOOD (ROUTINE X 2)
Culture: NO GROWTH
Culture: NO GROWTH
SPECIAL REQUESTS: ADEQUATE
SPECIAL REQUESTS: ADEQUATE

## 2017-07-23 DIAGNOSIS — J9611 Chronic respiratory failure with hypoxia: Secondary | ICD-10-CM | POA: Diagnosis not present

## 2017-07-23 DIAGNOSIS — J989 Respiratory disorder, unspecified: Secondary | ICD-10-CM | POA: Diagnosis not present

## 2017-07-24 DIAGNOSIS — I5031 Acute diastolic (congestive) heart failure: Secondary | ICD-10-CM | POA: Diagnosis not present

## 2017-07-24 DIAGNOSIS — Z7952 Long term (current) use of systemic steroids: Secondary | ICD-10-CM | POA: Diagnosis not present

## 2017-07-24 DIAGNOSIS — J441 Chronic obstructive pulmonary disease with (acute) exacerbation: Secondary | ICD-10-CM | POA: Diagnosis not present

## 2017-07-24 DIAGNOSIS — J440 Chronic obstructive pulmonary disease with acute lower respiratory infection: Secondary | ICD-10-CM | POA: Diagnosis not present

## 2017-07-24 DIAGNOSIS — R531 Weakness: Secondary | ICD-10-CM | POA: Diagnosis not present

## 2017-07-24 DIAGNOSIS — Z9981 Dependence on supplemental oxygen: Secondary | ICD-10-CM | POA: Diagnosis not present

## 2017-07-24 DIAGNOSIS — Z792 Long term (current) use of antibiotics: Secondary | ICD-10-CM | POA: Diagnosis not present

## 2017-07-24 DIAGNOSIS — J189 Pneumonia, unspecified organism: Secondary | ICD-10-CM | POA: Diagnosis not present

## 2017-07-24 DIAGNOSIS — F17211 Nicotine dependence, cigarettes, in remission: Secondary | ICD-10-CM | POA: Diagnosis not present

## 2017-07-25 ENCOUNTER — Ambulatory Visit
Admission: RE | Admit: 2017-07-25 | Discharge: 2017-07-25 | Disposition: A | Discharge status: Home / Self Care | Payer: Medicare Other | Source: Ambulatory Visit | Attending: Family Medicine | Admitting: Family Medicine

## 2017-07-25 ENCOUNTER — Other Ambulatory Visit: Payer: Self-pay | Admitting: Family Medicine

## 2017-07-25 DIAGNOSIS — J449 Chronic obstructive pulmonary disease, unspecified: Secondary | ICD-10-CM | POA: Diagnosis not present

## 2017-07-25 IMAGING — CR DG CHEST 2V
2 series · 2 of 2 positions shown · non-contrast
Comparison: Chest x-ray of [DATE] and [DATE].

CLINICAL DATA: Recently treated for left-sided pneumonia. History
of COPD.

EXAM:
CHEST  2 VIEW

[w chest pa *]
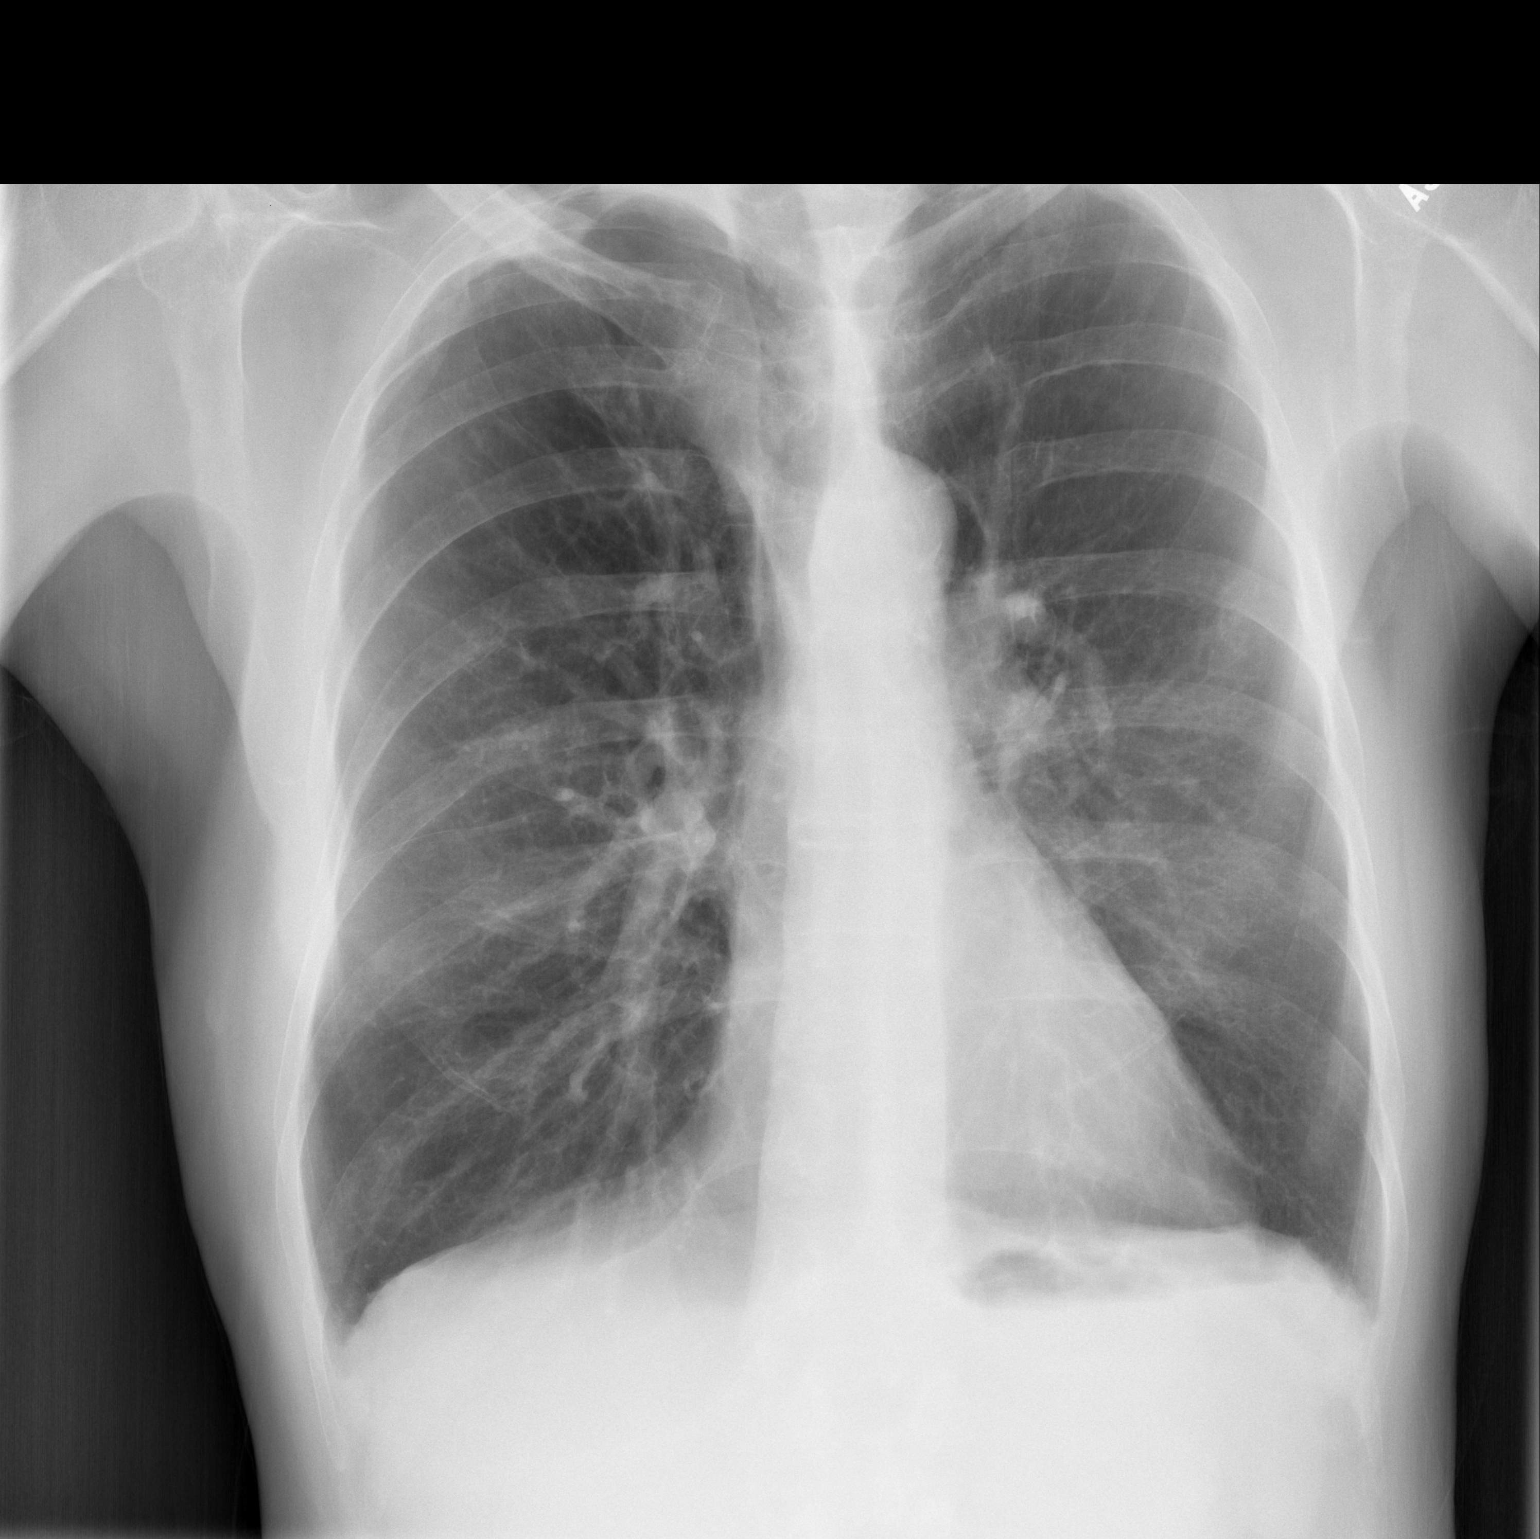

[w chest lat *]
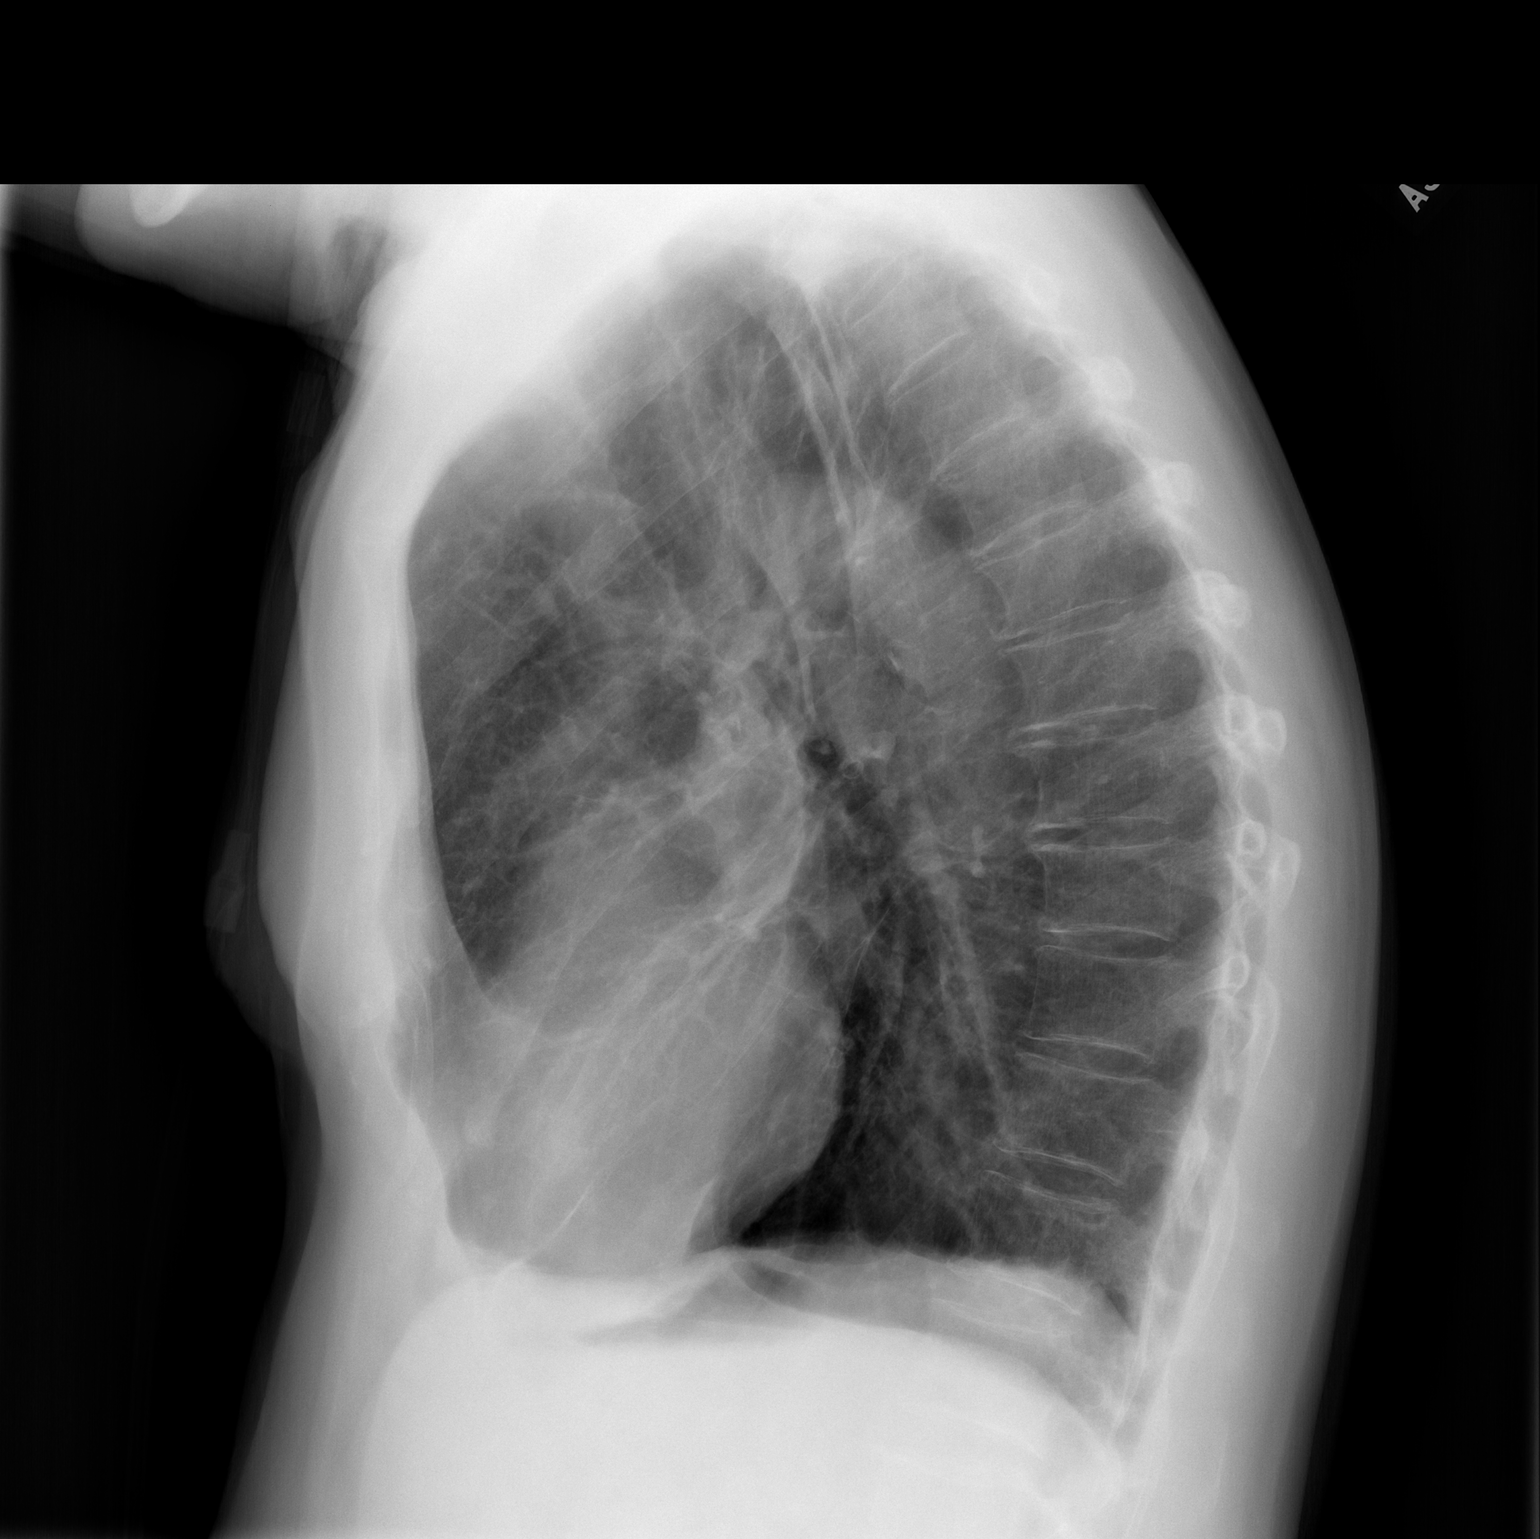

[2 of 2 positions shown; findings below may reference images not displayed]

FINDINGS: The lungs are hyperinflated with hemidiaphragm flattening. The small
left pleural effusion has nearly totally resolved. There is no
alveolar infiltrate. The interstitial markings on the left remain
mildly increased. The heart and pulmonary vascularity are normal.
There calcification in the wall of the aortic arch. The bony thorax
exhibits no acute abnormality.
IMPRESSION: Interval near-total resolution of left basilar pneumonia and pleural
effusion. Stable changes of COPD. An additional follow-up chest
x-ray following completion of antibiotic therapy or in 3-4 weeks is
recommended to assure complete clearing.

Thoracic aortic atherosclerosis.

## 2017-07-27 ENCOUNTER — Encounter (HOSPITAL_COMMUNITY): Payer: Self-pay | Admitting: Nurse Practitioner

## 2017-07-27 ENCOUNTER — Emergency Department (HOSPITAL_COMMUNITY)
Admission: EM | Admit: 2017-07-27 | Discharge: 2017-07-27 | Disposition: A | Discharge status: Home / Self Care | Payer: Medicare Other | Attending: Emergency Medicine | Admitting: Emergency Medicine

## 2017-07-27 DIAGNOSIS — J440 Chronic obstructive pulmonary disease with acute lower respiratory infection: Secondary | ICD-10-CM | POA: Diagnosis not present

## 2017-07-27 DIAGNOSIS — R531 Weakness: Secondary | ICD-10-CM | POA: Diagnosis not present

## 2017-07-27 DIAGNOSIS — J441 Chronic obstructive pulmonary disease with (acute) exacerbation: Secondary | ICD-10-CM | POA: Diagnosis not present

## 2017-07-27 DIAGNOSIS — J189 Pneumonia, unspecified organism: Secondary | ICD-10-CM | POA: Diagnosis not present

## 2017-07-27 DIAGNOSIS — Z87891 Personal history of nicotine dependence: Secondary | ICD-10-CM | POA: Insufficient documentation

## 2017-07-27 DIAGNOSIS — R040 Epistaxis: Secondary | ICD-10-CM | POA: Insufficient documentation

## 2017-07-27 DIAGNOSIS — I5031 Acute diastolic (congestive) heart failure: Secondary | ICD-10-CM | POA: Diagnosis not present

## 2017-07-27 DIAGNOSIS — I509 Heart failure, unspecified: Secondary | ICD-10-CM | POA: Diagnosis not present

## 2017-07-27 DIAGNOSIS — J449 Chronic obstructive pulmonary disease, unspecified: Secondary | ICD-10-CM | POA: Insufficient documentation

## 2017-07-27 DIAGNOSIS — J989 Respiratory disorder, unspecified: Secondary | ICD-10-CM | POA: Diagnosis not present

## 2017-07-27 DIAGNOSIS — F17211 Nicotine dependence, cigarettes, in remission: Secondary | ICD-10-CM | POA: Diagnosis not present

## 2017-07-27 MED ORDER — OXYMETAZOLINE HCL 0.05 % NA SOLN
1.0000 | Freq: Once | NASAL | Status: AC
  Administered 2017-07-27: 1 via NASAL
  Filled 2017-07-27: qty 15

## 2017-07-27 NOTE — ED Triage Notes (Signed)
Pt is presented with a nose bleed that family report has been ongoing for the last 3 hours. They further report that they suspect that the nose bleed could be related the nasal canula supplemental O2 he started using since last hospitalization.

## 2017-07-27 NOTE — Discharge Instructions (Addendum)
Please use nasal saline drops every 4-6 hours Use humidifier If your nose starts bleeding again, use Afrin in both nostrils, hold pressure on the nose for 10 minutes continuously. If it continues to bleeding, please return to the ED Please follow up with your doctor

## 2017-07-27 NOTE — ED Provider Notes (Signed)
[facility] DEPT Provider Note   CSN: [id number] Arrival date & time: 07/27/17  2015     History   Chief Complaint Chief Complaint  Patient presents with  . Epistaxis    HPI George Frazier is a 72 y.o. male who presents with epistaxis. PMH significant for COPD and recent hospitalization for COPD exacerbation and CAP. He was discharged home from the hospital about 2 weeks ago. He is on home oxygen 24/7 for approximately the next 10 weeks and will slowly be weaning down. He blew his nose hard today and noticed some blood in the mucous. Today the patient developed a bilateral nose bleed at about 3PM. It stopped for a couple hours and then restarted so he came to the ED. He placed a packing in both nares which controlled the bleeding. His son believes it may have been caused by the O2 being too dry since they were not given a humidifier. He is not on blood thinners. His symptoms from his prior hospitalization have improved.    HPI  History reviewed. No pertinent past medical history.  Patient Active Problem List   Diagnosis Date Noted  . CAP (community acquired pneumonia) 07/14/2017  . COPD (chronic obstructive pulmonary disease) ([place]) 07/14/2017  . Heart failure ([place]) 07/14/2017    History reviewed. No pertinent surgical history.     Home Medications    Prior to Admission medications   Medication Sig Start Date End Date Taking? Authorizing Provider  albuterol (PROVENTIL HFA;VENTOLIN HFA) 108 (90 Base) MCG/ACT inhaler Inhale 2 puffs into the lungs every 6 (six) hours as needed for wheezing or shortness of breath. 07/18/17   Patrecia Pour, Christean Grief, MD  budesonide-formoterol [facility]) 160-4.5 MCG/ACT inhaler Inhale 2 puffs into the lungs 2 (two) times daily at 10 AM and 5 PM. 07/18/17   Patrecia Pour, Christean Grief, MD  guaiFENesin (MUCINEX) 600 MG 12 hr tablet Take 1 tablet (600 mg total) by mouth 2 (two) times daily as needed for cough or to loosen phlegm.  07/18/17   Patrecia Pour, Christean Grief, MD  predniSONE (DELTASONE) 10 MG tablet Take 4 tablets for 3 days; Take 3 tablets for 4 days; Take 2 tablets for 3 days; Take 1 tablet for 4 days 07/18/17   Doreatha Lew, MD    Family History Family History  Problem Relation Age of Onset  . CAD Mother 74  . COPD Father 51  . AAA (abdominal aortic aneurysm) Father   . Cancer Neg Hx     Social History Social History   Tobacco Use  . Smoking status: Former Smoker    Packs/day: 1.00    Years: 40.00    Pack years: 40.00    Last attempt to quit: 2008    Years since quitting: 11.0  . Smokeless tobacco: Former Network engineer Use Topics  . Alcohol use: No    Frequency: Never  . Drug use: No     Allergies   Patient has no known allergies.   Review of Systems Review of Systems  Constitutional: Negative for chills and fever.  HENT: Positive for nosebleeds and rhinorrhea. Negative for congestion and sore throat.   Respiratory: Positive for cough (improving) and shortness of breath (improving).   Cardiovascular: Negative for chest pain.  Neurological: Negative for syncope and light-headedness.  Hematological: Does not bruise/bleed easily.  All other systems reviewed and are negative.    Physical Exam Updated Vital Signs BP (!) 158/102   Pulse (!) 107  Temp 97.8 F (36.6 C) (Oral)   Resp 20   SpO2 (!) 88%   Physical Exam  Constitutional: He is oriented to person, place, and time. He appears well-developed and well-nourished. No distress.  HENT:  Head: Normocephalic and atraumatic.  Nose: Epistaxis (no active bleeding) is observed.  Mouth/Throat: Uvula is midline, oropharynx is clear and moist and mucous membranes are normal.  Blood draining in the posterior pharynx  Eyes: Conjunctivae are normal. Pupils are equal, round, and reactive to light. Right eye exhibits no discharge. Left eye exhibits no discharge. No scleral icterus.  Neck: Normal range of motion.  Cardiovascular: Normal  rate and regular rhythm. Exam reveals no gallop and no friction rub.  No murmur heard. Pulmonary/Chest: Effort normal and breath sounds normal. No stridor. No respiratory distress. He has no wheezes. He has no rales. He exhibits no tenderness.  Abdominal: He exhibits no distension.  Neurological: He is alert and oriented to person, place, and time.  Skin: Skin is warm and dry.  Psychiatric: He has a normal mood and affect. His behavior is normal.  Nursing note and vitals reviewed.    ED Treatments / Results  Labs (all labs ordered are listed, but only abnormal results are displayed) Labs Reviewed - No data to display  EKG  EKG Interpretation None       Radiology No results found.  Procedures Procedures (including critical care time)  Medications Ordered in ED Medications  oxymetazoline (AFRIN) 0.05 % nasal spray 1 spray (1 spray Each Nare Given 07/27/17 2220)     Initial Impression / Assessment and Plan / ED Course  I have reviewed the triage vital signs and the nursing notes.  Pertinent labs & imaging results that were available during my care of the patient were reviewed by me and considered in my medical decision making (see chart for details).  72 year old with resolved epistaxis. Likely due to dry mucous membranes and blowing his nose too hard this morning. He is hypertensive but on recheck is it improved. He had an episode of hypoxia to 88% on arrival however he has been unable to use his O2 because he put paper towels in his nose. After paper towels were removed and nasal cannula was placed back in nares, O2 was improved to 95%. Lungs are CTA. On examination of the nares it appears epistaxis was resolved. It was likely a posterior bleed. He was given Afrin for home as needed for rebleeding and was advised to use nasal saline drops and a humidifier until he can obtain one for his home O2. A consult to CM was placed to assist with this but the family was also advised to  call their home health agency tomorrow. The patient and family verbalized understanding. They were given return precautions.  Final Clinical Impressions(s) / ED Diagnoses   Final diagnoses:  Epistaxis    ED Discharge Orders    None       Recardo Evangelist, PA-C 07/27/17 2314    Lacretia Leigh, MD 07/28/17 1630

## 2017-07-30 DIAGNOSIS — F17211 Nicotine dependence, cigarettes, in remission: Secondary | ICD-10-CM | POA: Diagnosis not present

## 2017-07-30 DIAGNOSIS — J441 Chronic obstructive pulmonary disease with (acute) exacerbation: Secondary | ICD-10-CM | POA: Diagnosis not present

## 2017-07-30 DIAGNOSIS — I5031 Acute diastolic (congestive) heart failure: Secondary | ICD-10-CM | POA: Diagnosis not present

## 2017-07-30 DIAGNOSIS — R531 Weakness: Secondary | ICD-10-CM | POA: Diagnosis not present

## 2017-07-30 DIAGNOSIS — J440 Chronic obstructive pulmonary disease with acute lower respiratory infection: Secondary | ICD-10-CM | POA: Diagnosis not present

## 2017-07-30 DIAGNOSIS — J189 Pneumonia, unspecified organism: Secondary | ICD-10-CM | POA: Diagnosis not present

## 2017-07-30 NOTE — Care Management Note (Signed)
Case Management Note  Patient Details  Name: George Frazier MRN: [id number] Date of Birth: 01-Oct-1945  CM consulted for needing humidity associated with home O2.  Contacted [facility] rep Santiago Glad who states pt is active with their DME services.  Explained why the pt came to the ED on 07/27/2017 and asked her to follow up with PCP.  No further CM needs noted at this time.  Expected Discharge Date:   07/27/2017               Expected Discharge Plan:  [place]  Discharge planning Services  CM Consult  Post Acute Care Choice:  Home Health, Durable Medical Equipment Choice offered to:  Patient  DME Arranged:  Oxygen DME Agency:  [place]:  PT [place]:  [facility]  Status of Service:  Completed, signed off  Krysti Hickling, Benjaman Lobe, RN 07/30/2017, 10:55 AM

## 2017-07-31 DIAGNOSIS — E875 Hyperkalemia: Secondary | ICD-10-CM | POA: Diagnosis not present

## 2017-08-01 DIAGNOSIS — J440 Chronic obstructive pulmonary disease with acute lower respiratory infection: Secondary | ICD-10-CM | POA: Diagnosis not present

## 2017-08-01 DIAGNOSIS — I5031 Acute diastolic (congestive) heart failure: Secondary | ICD-10-CM | POA: Diagnosis not present

## 2017-08-01 DIAGNOSIS — R531 Weakness: Secondary | ICD-10-CM | POA: Diagnosis not present

## 2017-08-01 DIAGNOSIS — J441 Chronic obstructive pulmonary disease with (acute) exacerbation: Secondary | ICD-10-CM | POA: Diagnosis not present

## 2017-08-01 DIAGNOSIS — J189 Pneumonia, unspecified organism: Secondary | ICD-10-CM | POA: Diagnosis not present

## 2017-08-01 DIAGNOSIS — F17211 Nicotine dependence, cigarettes, in remission: Secondary | ICD-10-CM | POA: Diagnosis not present

## 2017-08-06 DIAGNOSIS — J189 Pneumonia, unspecified organism: Secondary | ICD-10-CM | POA: Diagnosis not present

## 2017-08-06 DIAGNOSIS — R531 Weakness: Secondary | ICD-10-CM | POA: Diagnosis not present

## 2017-08-06 DIAGNOSIS — F17211 Nicotine dependence, cigarettes, in remission: Secondary | ICD-10-CM | POA: Diagnosis not present

## 2017-08-06 DIAGNOSIS — J441 Chronic obstructive pulmonary disease with (acute) exacerbation: Secondary | ICD-10-CM | POA: Diagnosis not present

## 2017-08-06 DIAGNOSIS — I5031 Acute diastolic (congestive) heart failure: Secondary | ICD-10-CM | POA: Diagnosis not present

## 2017-08-06 DIAGNOSIS — J440 Chronic obstructive pulmonary disease with acute lower respiratory infection: Secondary | ICD-10-CM | POA: Diagnosis not present

## 2017-08-09 DIAGNOSIS — R531 Weakness: Secondary | ICD-10-CM | POA: Diagnosis not present

## 2017-08-09 DIAGNOSIS — J440 Chronic obstructive pulmonary disease with acute lower respiratory infection: Secondary | ICD-10-CM | POA: Diagnosis not present

## 2017-08-09 DIAGNOSIS — J189 Pneumonia, unspecified organism: Secondary | ICD-10-CM | POA: Diagnosis not present

## 2017-08-09 DIAGNOSIS — J441 Chronic obstructive pulmonary disease with (acute) exacerbation: Secondary | ICD-10-CM | POA: Diagnosis not present

## 2017-08-09 DIAGNOSIS — F17211 Nicotine dependence, cigarettes, in remission: Secondary | ICD-10-CM | POA: Diagnosis not present

## 2017-08-09 DIAGNOSIS — I5031 Acute diastolic (congestive) heart failure: Secondary | ICD-10-CM | POA: Diagnosis not present

## 2017-08-14 DIAGNOSIS — J441 Chronic obstructive pulmonary disease with (acute) exacerbation: Secondary | ICD-10-CM | POA: Diagnosis not present

## 2017-08-14 DIAGNOSIS — I5031 Acute diastolic (congestive) heart failure: Secondary | ICD-10-CM | POA: Diagnosis not present

## 2017-08-14 DIAGNOSIS — J189 Pneumonia, unspecified organism: Secondary | ICD-10-CM | POA: Diagnosis not present

## 2017-08-14 DIAGNOSIS — F17211 Nicotine dependence, cigarettes, in remission: Secondary | ICD-10-CM | POA: Diagnosis not present

## 2017-08-14 DIAGNOSIS — R531 Weakness: Secondary | ICD-10-CM | POA: Diagnosis not present

## 2017-08-14 DIAGNOSIS — J440 Chronic obstructive pulmonary disease with acute lower respiratory infection: Secondary | ICD-10-CM | POA: Diagnosis not present

## 2017-08-16 DIAGNOSIS — J441 Chronic obstructive pulmonary disease with (acute) exacerbation: Secondary | ICD-10-CM | POA: Diagnosis not present

## 2017-08-16 DIAGNOSIS — J440 Chronic obstructive pulmonary disease with acute lower respiratory infection: Secondary | ICD-10-CM | POA: Diagnosis not present

## 2017-08-16 DIAGNOSIS — J189 Pneumonia, unspecified organism: Secondary | ICD-10-CM | POA: Diagnosis not present

## 2017-08-16 DIAGNOSIS — I5031 Acute diastolic (congestive) heart failure: Secondary | ICD-10-CM | POA: Diagnosis not present

## 2017-08-16 DIAGNOSIS — F17211 Nicotine dependence, cigarettes, in remission: Secondary | ICD-10-CM | POA: Diagnosis not present

## 2017-08-16 DIAGNOSIS — R531 Weakness: Secondary | ICD-10-CM | POA: Diagnosis not present

## 2017-08-21 DIAGNOSIS — J441 Chronic obstructive pulmonary disease with (acute) exacerbation: Secondary | ICD-10-CM | POA: Diagnosis not present

## 2017-08-21 DIAGNOSIS — J189 Pneumonia, unspecified organism: Secondary | ICD-10-CM | POA: Diagnosis not present

## 2017-08-21 DIAGNOSIS — R531 Weakness: Secondary | ICD-10-CM | POA: Diagnosis not present

## 2017-08-21 DIAGNOSIS — J440 Chronic obstructive pulmonary disease with acute lower respiratory infection: Secondary | ICD-10-CM | POA: Diagnosis not present

## 2017-08-21 DIAGNOSIS — F17211 Nicotine dependence, cigarettes, in remission: Secondary | ICD-10-CM | POA: Diagnosis not present

## 2017-08-21 DIAGNOSIS — I5031 Acute diastolic (congestive) heart failure: Secondary | ICD-10-CM | POA: Diagnosis not present

## 2017-08-28 DIAGNOSIS — F17211 Nicotine dependence, cigarettes, in remission: Secondary | ICD-10-CM | POA: Diagnosis not present

## 2017-08-28 DIAGNOSIS — J440 Chronic obstructive pulmonary disease with acute lower respiratory infection: Secondary | ICD-10-CM | POA: Diagnosis not present

## 2017-08-28 DIAGNOSIS — J189 Pneumonia, unspecified organism: Secondary | ICD-10-CM | POA: Diagnosis not present

## 2017-08-28 DIAGNOSIS — J441 Chronic obstructive pulmonary disease with (acute) exacerbation: Secondary | ICD-10-CM | POA: Diagnosis not present

## 2017-08-28 DIAGNOSIS — R531 Weakness: Secondary | ICD-10-CM | POA: Diagnosis not present

## 2017-08-28 DIAGNOSIS — I5031 Acute diastolic (congestive) heart failure: Secondary | ICD-10-CM | POA: Diagnosis not present

## 2017-09-04 DIAGNOSIS — I5031 Acute diastolic (congestive) heart failure: Secondary | ICD-10-CM | POA: Diagnosis not present

## 2017-09-04 DIAGNOSIS — R531 Weakness: Secondary | ICD-10-CM | POA: Diagnosis not present

## 2017-09-04 DIAGNOSIS — J440 Chronic obstructive pulmonary disease with acute lower respiratory infection: Secondary | ICD-10-CM | POA: Diagnosis not present

## 2017-09-04 DIAGNOSIS — F17211 Nicotine dependence, cigarettes, in remission: Secondary | ICD-10-CM | POA: Diagnosis not present

## 2017-09-04 DIAGNOSIS — J189 Pneumonia, unspecified organism: Secondary | ICD-10-CM | POA: Diagnosis not present

## 2017-09-04 DIAGNOSIS — J441 Chronic obstructive pulmonary disease with (acute) exacerbation: Secondary | ICD-10-CM | POA: Diagnosis not present

## 2017-09-12 ENCOUNTER — Ambulatory Visit
Admission: RE | Admit: 2017-09-12 | Discharge: 2017-09-12 | Disposition: A | Discharge status: Home / Self Care | Payer: Medicare Other | Source: Ambulatory Visit | Attending: Family Medicine | Admitting: Family Medicine

## 2017-09-12 ENCOUNTER — Other Ambulatory Visit: Payer: Self-pay | Admitting: Family Medicine

## 2017-09-12 DIAGNOSIS — J189 Pneumonia, unspecified organism: Secondary | ICD-10-CM | POA: Diagnosis not present

## 2017-09-12 DIAGNOSIS — R05 Cough: Secondary | ICD-10-CM

## 2017-09-12 DIAGNOSIS — R059 Cough, unspecified: Secondary | ICD-10-CM

## 2017-09-12 IMAGING — CR DG CHEST 2V
2 series · 2 of 2 positions shown · non-contrast
Comparison: [DATE], [DATE]

CLINICAL DATA: Follow-up pneumonia

EXAM:
CHEST  2 VIEW

[w chest pa]
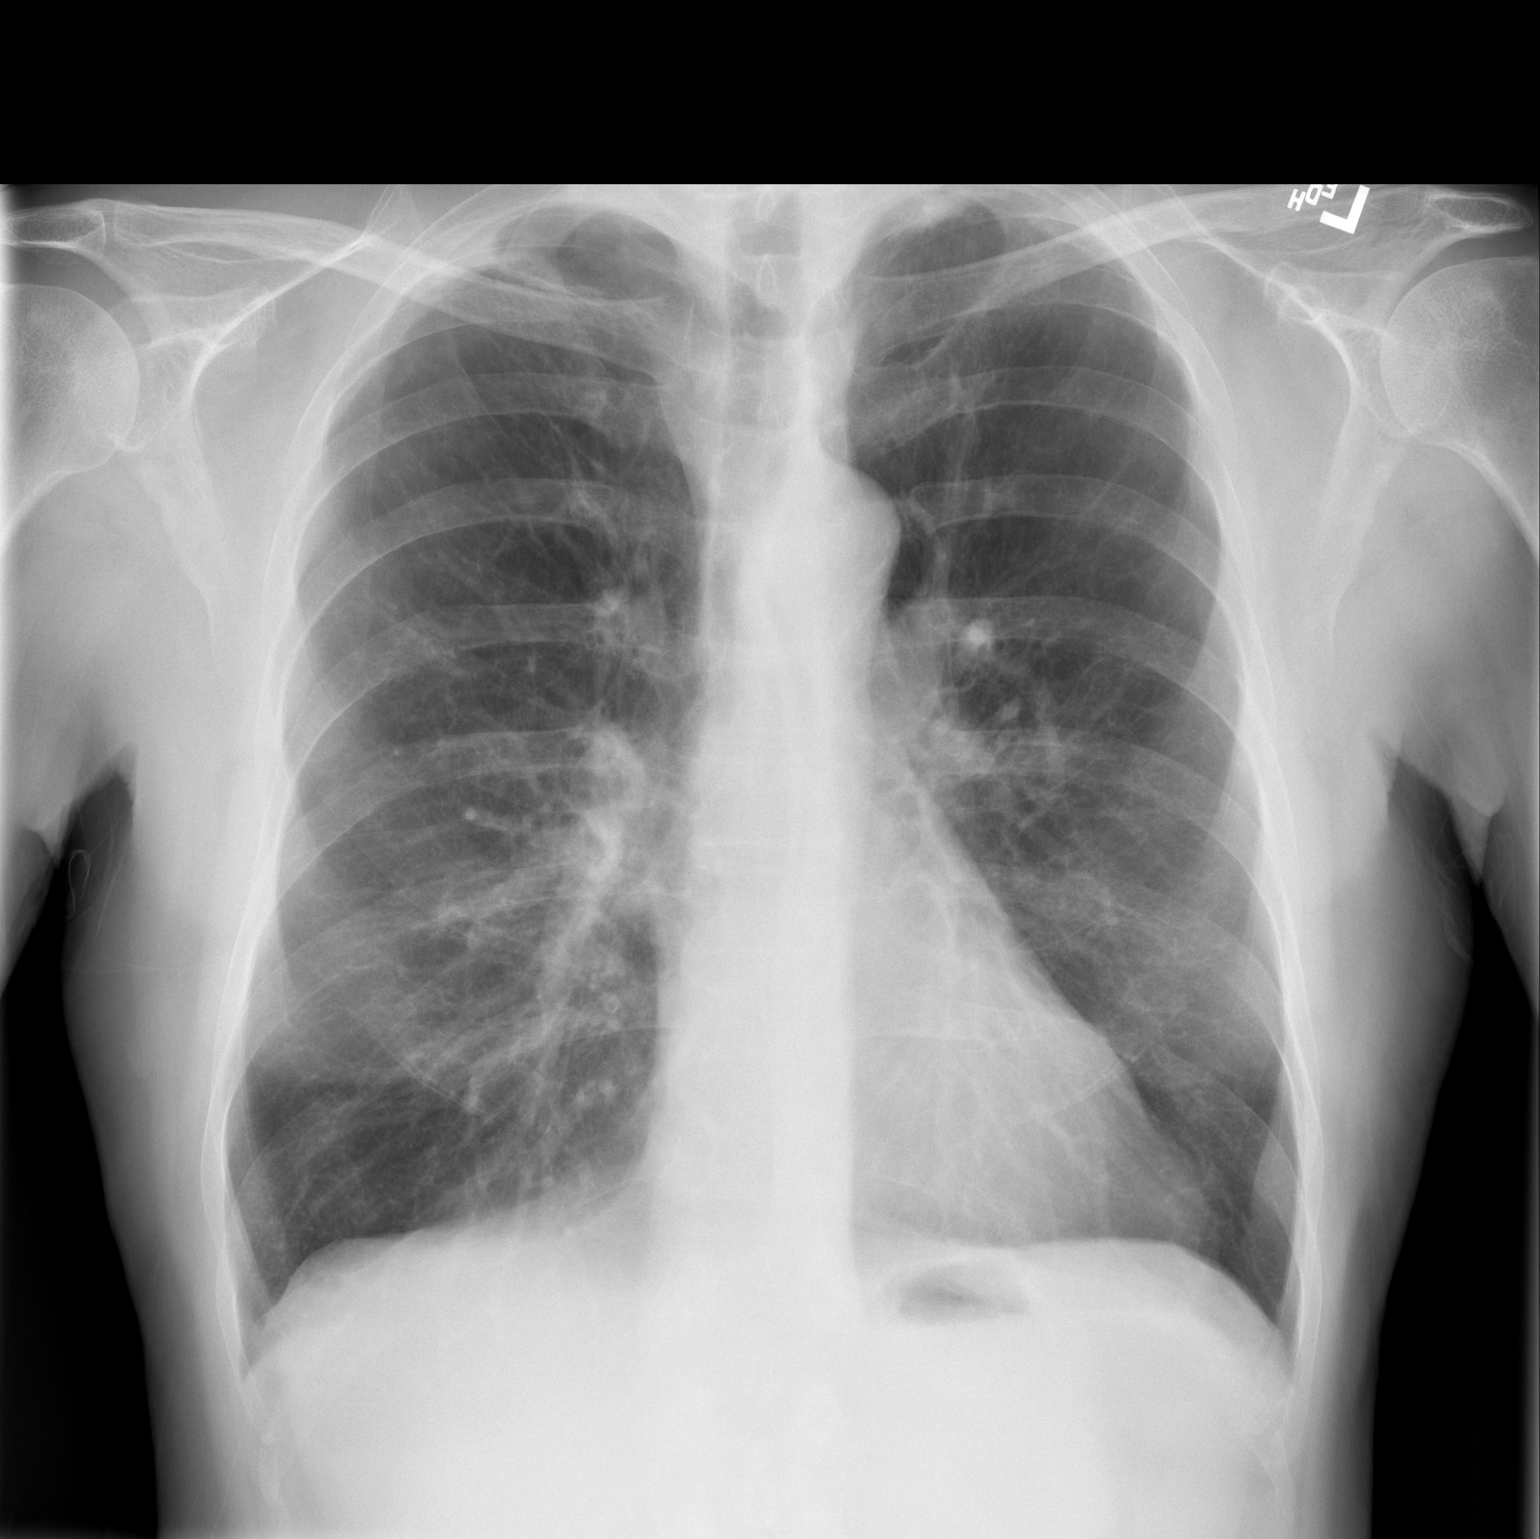

[w chest lat]
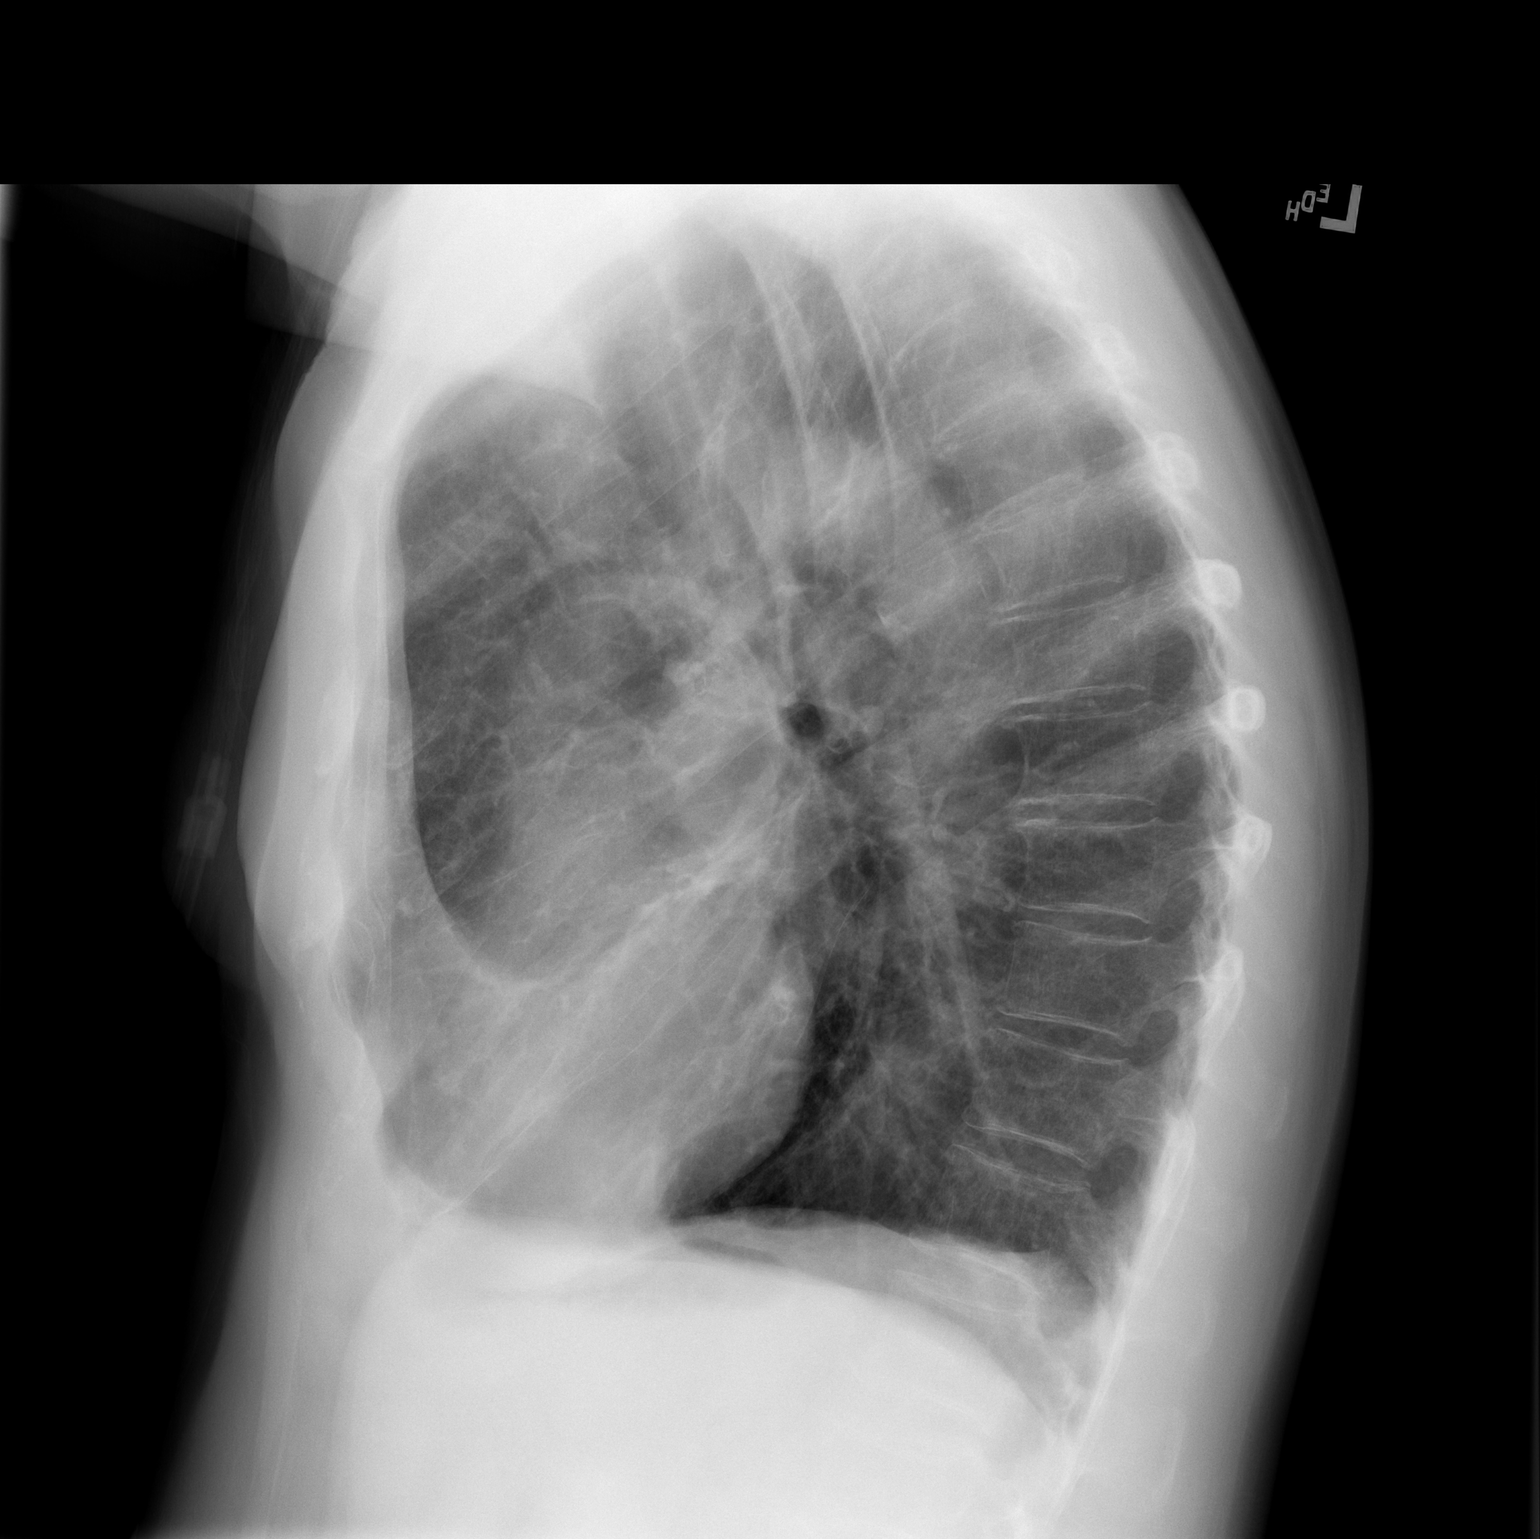

[2 of 2 positions shown; findings below may reference images not displayed]

FINDINGS: Hyperinflationwith emphysematous disease. Biapical pleural and
parenchymal scarring as before. Clearing of previously noted left
pleural effusion and basilar infiltrate. No acute consolidation.
Normal cardiomediastinal silhouette with aortic atherosclerosis. No
pneumothorax.
IMPRESSION: 1. Clearing of previously noted left pleural effusion and basilar
infiltrate
2. Hyperinflation with emphysematous disease.

## 2017-09-17 ENCOUNTER — Emergency Department (HOSPITAL_COMMUNITY): Payer: Medicare Other

## 2017-09-17 ENCOUNTER — Encounter (HOSPITAL_COMMUNITY): Payer: Self-pay | Admitting: Emergency Medicine

## 2017-09-17 ENCOUNTER — Inpatient Hospital Stay (HOSPITAL_COMMUNITY)
Admission: EM | Admit: 2017-09-17 | Discharge: 2017-09-21 | DRG: 871 | Disposition: A | Discharge status: Home / Self Care | Payer: Medicare Other | Attending: Family Medicine | Admitting: Family Medicine

## 2017-09-17 ENCOUNTER — Inpatient Hospital Stay (HOSPITAL_COMMUNITY): Payer: Medicare Other

## 2017-09-17 DIAGNOSIS — R6520 Severe sepsis without septic shock: Secondary | ICD-10-CM

## 2017-09-17 DIAGNOSIS — Z7952 Long term (current) use of systemic steroids: Secondary | ICD-10-CM | POA: Diagnosis not present

## 2017-09-17 DIAGNOSIS — J9622 Acute and chronic respiratory failure with hypercapnia: Secondary | ICD-10-CM | POA: Diagnosis present

## 2017-09-17 DIAGNOSIS — J449 Chronic obstructive pulmonary disease, unspecified: Secondary | ICD-10-CM | POA: Diagnosis not present

## 2017-09-17 DIAGNOSIS — R0603 Acute respiratory distress: Secondary | ICD-10-CM | POA: Diagnosis not present

## 2017-09-17 DIAGNOSIS — B974 Respiratory syncytial virus as the cause of diseases classified elsewhere: Secondary | ICD-10-CM | POA: Diagnosis present

## 2017-09-17 DIAGNOSIS — I4581 Long QT syndrome: Secondary | ICD-10-CM | POA: Diagnosis present

## 2017-09-17 DIAGNOSIS — R6521 Severe sepsis with septic shock: Secondary | ICD-10-CM | POA: Diagnosis present

## 2017-09-17 DIAGNOSIS — J111 Influenza due to unidentified influenza virus with other respiratory manifestations: Secondary | ICD-10-CM | POA: Diagnosis not present

## 2017-09-17 DIAGNOSIS — J9690 Respiratory failure, unspecified, unspecified whether with hypoxia or hypercapnia: Secondary | ICD-10-CM | POA: Diagnosis not present

## 2017-09-17 DIAGNOSIS — Z9981 Dependence on supplemental oxygen: Secondary | ICD-10-CM | POA: Diagnosis not present

## 2017-09-17 DIAGNOSIS — E876 Hypokalemia: Secondary | ICD-10-CM | POA: Diagnosis not present

## 2017-09-17 DIAGNOSIS — N281 Cyst of kidney, acquired: Secondary | ICD-10-CM | POA: Diagnosis present

## 2017-09-17 DIAGNOSIS — F419 Anxiety disorder, unspecified: Secondary | ICD-10-CM | POA: Diagnosis not present

## 2017-09-17 DIAGNOSIS — Z87891 Personal history of nicotine dependence: Secondary | ICD-10-CM | POA: Diagnosis not present

## 2017-09-17 DIAGNOSIS — A419 Sepsis, unspecified organism: Secondary | ICD-10-CM | POA: Diagnosis present

## 2017-09-17 DIAGNOSIS — Q6211 Congenital occlusion of ureteropelvic junction: Secondary | ICD-10-CM | POA: Diagnosis not present

## 2017-09-17 DIAGNOSIS — J9602 Acute respiratory failure with hypercapnia: Secondary | ICD-10-CM | POA: Diagnosis not present

## 2017-09-17 DIAGNOSIS — J441 Chronic obstructive pulmonary disease with (acute) exacerbation: Secondary | ICD-10-CM | POA: Diagnosis present

## 2017-09-17 DIAGNOSIS — J9601 Acute respiratory failure with hypoxia: Secondary | ICD-10-CM | POA: Diagnosis not present

## 2017-09-17 DIAGNOSIS — Z4682 Encounter for fitting and adjustment of non-vascular catheter: Secondary | ICD-10-CM | POA: Diagnosis not present

## 2017-09-17 DIAGNOSIS — J9611 Chronic respiratory failure with hypoxia: Secondary | ICD-10-CM | POA: Diagnosis present

## 2017-09-17 DIAGNOSIS — J9621 Acute and chronic respiratory failure with hypoxia: Secondary | ICD-10-CM | POA: Diagnosis present

## 2017-09-17 DIAGNOSIS — I6782 Cerebral ischemia: Secondary | ICD-10-CM | POA: Diagnosis not present

## 2017-09-17 DIAGNOSIS — J3489 Other specified disorders of nose and nasal sinuses: Secondary | ICD-10-CM | POA: Diagnosis not present

## 2017-09-17 DIAGNOSIS — R739 Hyperglycemia, unspecified: Secondary | ICD-10-CM | POA: Diagnosis not present

## 2017-09-17 DIAGNOSIS — Z66 Do not resuscitate: Secondary | ICD-10-CM | POA: Diagnosis present

## 2017-09-17 DIAGNOSIS — Z79899 Other long term (current) drug therapy: Secondary | ICD-10-CM | POA: Diagnosis not present

## 2017-09-17 DIAGNOSIS — N1330 Unspecified hydronephrosis: Secondary | ICD-10-CM

## 2017-09-17 DIAGNOSIS — E872 Acidosis: Secondary | ICD-10-CM | POA: Diagnosis present

## 2017-09-17 DIAGNOSIS — J440 Chronic obstructive pulmonary disease with acute lower respiratory infection: Secondary | ICD-10-CM | POA: Diagnosis present

## 2017-09-17 DIAGNOSIS — J989 Respiratory disorder, unspecified: Secondary | ICD-10-CM | POA: Diagnosis not present

## 2017-09-17 HISTORY — DX: Chronic obstructive pulmonary disease, unspecified: J44.9

## 2017-09-17 HISTORY — DX: Heart failure, unspecified: I50.9

## 2017-09-17 LAB — LACTIC ACID, PLASMA
LACTIC ACID, VENOUS: 2.7 mmol/L — AB (ref 0.5–1.9)
Lactic Acid, Venous: 4 mmol/L (ref 0.5–1.9)

## 2017-09-17 LAB — CBC WITH DIFFERENTIAL/PLATELET
Basophils Absolute: 0 10*3/uL (ref 0.0–0.1)
Basophils Relative: 0 %
Eosinophils Absolute: 0 10*3/uL (ref 0.0–0.7)
Eosinophils Relative: 0 %
HCT: 48.2 % (ref 39.0–52.0)
HEMOGLOBIN: 14.7 g/dL (ref 13.0–17.0)
LYMPHS ABS: 2.7 10*3/uL (ref 0.7–4.0)
LYMPHS PCT: 22 %
MCH: 30.6 pg (ref 26.0–34.0)
MCHC: 30.5 g/dL (ref 30.0–36.0)
MCV: 100.4 fL — AB (ref 78.0–100.0)
Monocytes Absolute: 2.7 10*3/uL — ABNORMAL HIGH (ref 0.1–1.0)
Monocytes Relative: 22 %
NEUTROS PCT: 56 %
Neutro Abs: 6.9 10*3/uL (ref 1.7–7.7)
Platelets: 318 10*3/uL (ref 150–400)
RBC: 4.8 MIL/uL (ref 4.22–5.81)
RDW: 14.8 % (ref 11.5–15.5)
WBC: 12.3 10*3/uL — ABNORMAL HIGH (ref 4.0–10.5)

## 2017-09-17 LAB — BLOOD GAS, ARTERIAL
ACID-BASE EXCESS: 2 mmol/L (ref 0.0–2.0)
Acid-base deficit: 4.2 mmol/L — ABNORMAL HIGH (ref 0.0–2.0)
Bicarbonate: 26.9 mmol/L (ref 20.0–28.0)
Bicarbonate: 28.4 mmol/L — ABNORMAL HIGH (ref 20.0–28.0)
DRAWN BY: 331471
DRAWN BY: 331471
FIO2: 100
FIO2: 50
MECHVT: 550 mL
MECHVT: 550 mL
O2 SAT: 99.1 %
O2 Saturation: 97.3 %
PCO2 ART: 82.2 mmHg — AB (ref 32.0–48.0)
PEEP: 5 cmH2O
PEEP: 5 cmH2O
PH ART: 7.141 — AB (ref 7.350–7.450)
PH ART: 7.33 — AB (ref 7.350–7.450)
PO2 ART: 400 mmHg — AB (ref 83.0–108.0)
PO2 ART: 97.3 mmHg (ref 83.0–108.0)
Patient temperature: 98.6
Patient temperature: 98.6
RATE: 15 resp/min
pCO2 arterial: 55.4 mmHg — ABNORMAL HIGH (ref 32.0–48.0)

## 2017-09-17 LAB — RESPIRATORY PANEL BY PCR
Adenovirus: NOT DETECTED
BORDETELLA PERTUSSIS-RVPCR: NOT DETECTED
CORONAVIRUS HKU1-RVPPCR: NOT DETECTED
CORONAVIRUS OC43-RVPPCR: NOT DETECTED
Chlamydophila pneumoniae: NOT DETECTED
Coronavirus 229E: NOT DETECTED
Coronavirus NL63: NOT DETECTED
INFLUENZA A H1 2009-RVPPR: NOT DETECTED
INFLUENZA A H3-RVPPCR: NOT DETECTED
INFLUENZA B-RVPPCR: NOT DETECTED
Influenza A H1: NOT DETECTED
Influenza A: NOT DETECTED
METAPNEUMOVIRUS-RVPPCR: NOT DETECTED
MYCOPLASMA PNEUMONIAE-RVPPCR: NOT DETECTED
PARAINFLUENZA VIRUS 1-RVPPCR: NOT DETECTED
PARAINFLUENZA VIRUS 2-RVPPCR: NOT DETECTED
PARAINFLUENZA VIRUS 3-RVPPCR: NOT DETECTED
Parainfluenza Virus 4: NOT DETECTED
RHINOVIRUS / ENTEROVIRUS - RVPPCR: NOT DETECTED
Respiratory Syncytial Virus: DETECTED — AB

## 2017-09-17 LAB — I-STAT TROPONIN, ED: Troponin i, poc: 0 ng/mL (ref 0.00–0.08)

## 2017-09-17 LAB — COMPREHENSIVE METABOLIC PANEL
ALK PHOS: 77 U/L (ref 38–126)
ALT: 19 U/L (ref 17–63)
AST: 33 U/L (ref 15–41)
Albumin: 3.8 g/dL (ref 3.5–5.0)
Anion gap: 15 (ref 5–15)
BUN: 14 mg/dL (ref 6–20)
CALCIUM: 8.6 mg/dL — AB (ref 8.9–10.3)
CO2: 27 mmol/L (ref 22–32)
CREATININE: 1.22 mg/dL (ref 0.61–1.24)
Chloride: 97 mmol/L — ABNORMAL LOW (ref 101–111)
GFR, EST NON AFRICAN AMERICAN: 58 mL/min — AB (ref >60)
Glucose, Bld: 198 mg/dL — ABNORMAL HIGH (ref 65–99)
Potassium: 5.1 mmol/L (ref 3.5–5.1)
Sodium: 139 mmol/L (ref 135–145)
Total Bilirubin: 1.8 mg/dL — ABNORMAL HIGH (ref 0.3–1.2)
Total Protein: 7.3 g/dL (ref 6.5–8.1)

## 2017-09-17 LAB — URINALYSIS, ROUTINE W REFLEX MICROSCOPIC
Bilirubin Urine: NEGATIVE
GLUCOSE, UA: 50 mg/dL — AB
Hgb urine dipstick: NEGATIVE
Ketones, ur: NEGATIVE mg/dL
Leukocytes, UA: NEGATIVE
Nitrite: NEGATIVE
PH: 5 (ref 5.0–8.0)
Protein, ur: 100 mg/dL — AB
SPECIFIC GRAVITY, URINE: 1.023 (ref 1.005–1.030)
Squamous Epithelial / LPF: NONE SEEN

## 2017-09-17 LAB — GLUCOSE, CAPILLARY: GLUCOSE-CAPILLARY: 178 mg/dL — AB (ref 65–99)

## 2017-09-17 LAB — INFLUENZA PANEL BY PCR (TYPE A & B)
Influenza A By PCR: NEGATIVE
Influenza B By PCR: NEGATIVE

## 2017-09-17 LAB — I-STAT CG4 LACTIC ACID, ED
LACTIC ACID, VENOUS: 5.83 mmol/L — AB (ref 0.5–1.9)
Lactic Acid, Venous: 7.58 mmol/L (ref 0.5–1.9)

## 2017-09-17 LAB — BRAIN NATRIURETIC PEPTIDE: B NATRIURETIC PEPTIDE 5: 403.4 pg/mL — AB (ref 0.0–100.0)

## 2017-09-17 LAB — CBG MONITORING, ED: Glucose-Capillary: 191 mg/dL — ABNORMAL HIGH (ref 65–99)

## 2017-09-17 IMAGING — DX DG CHEST 1V PORT
2 series · 2 of 2 positions shown · non-contrast
Comparison: [DATE]

CLINICAL DATA: Intubation post code

EXAM:
PORTABLE CHEST 1 VIEW

[chest ap (1 of 2)]
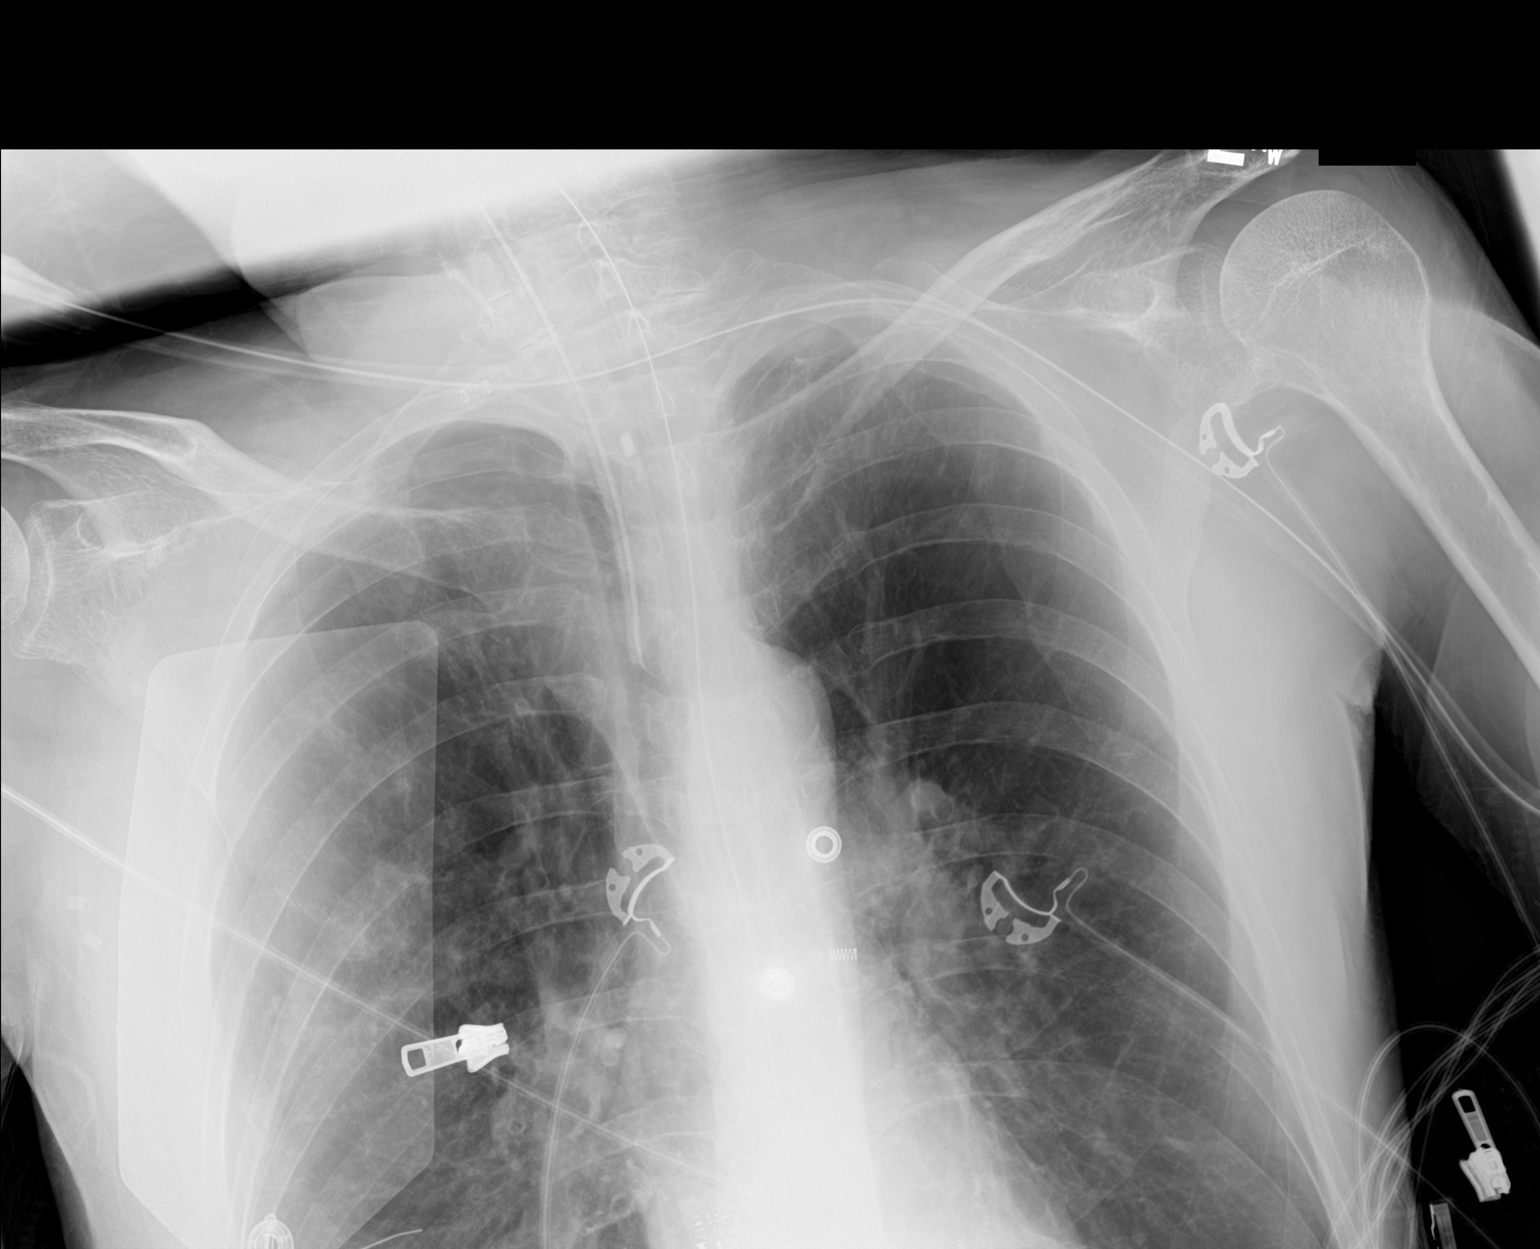

[chest ap (2 of 2)]
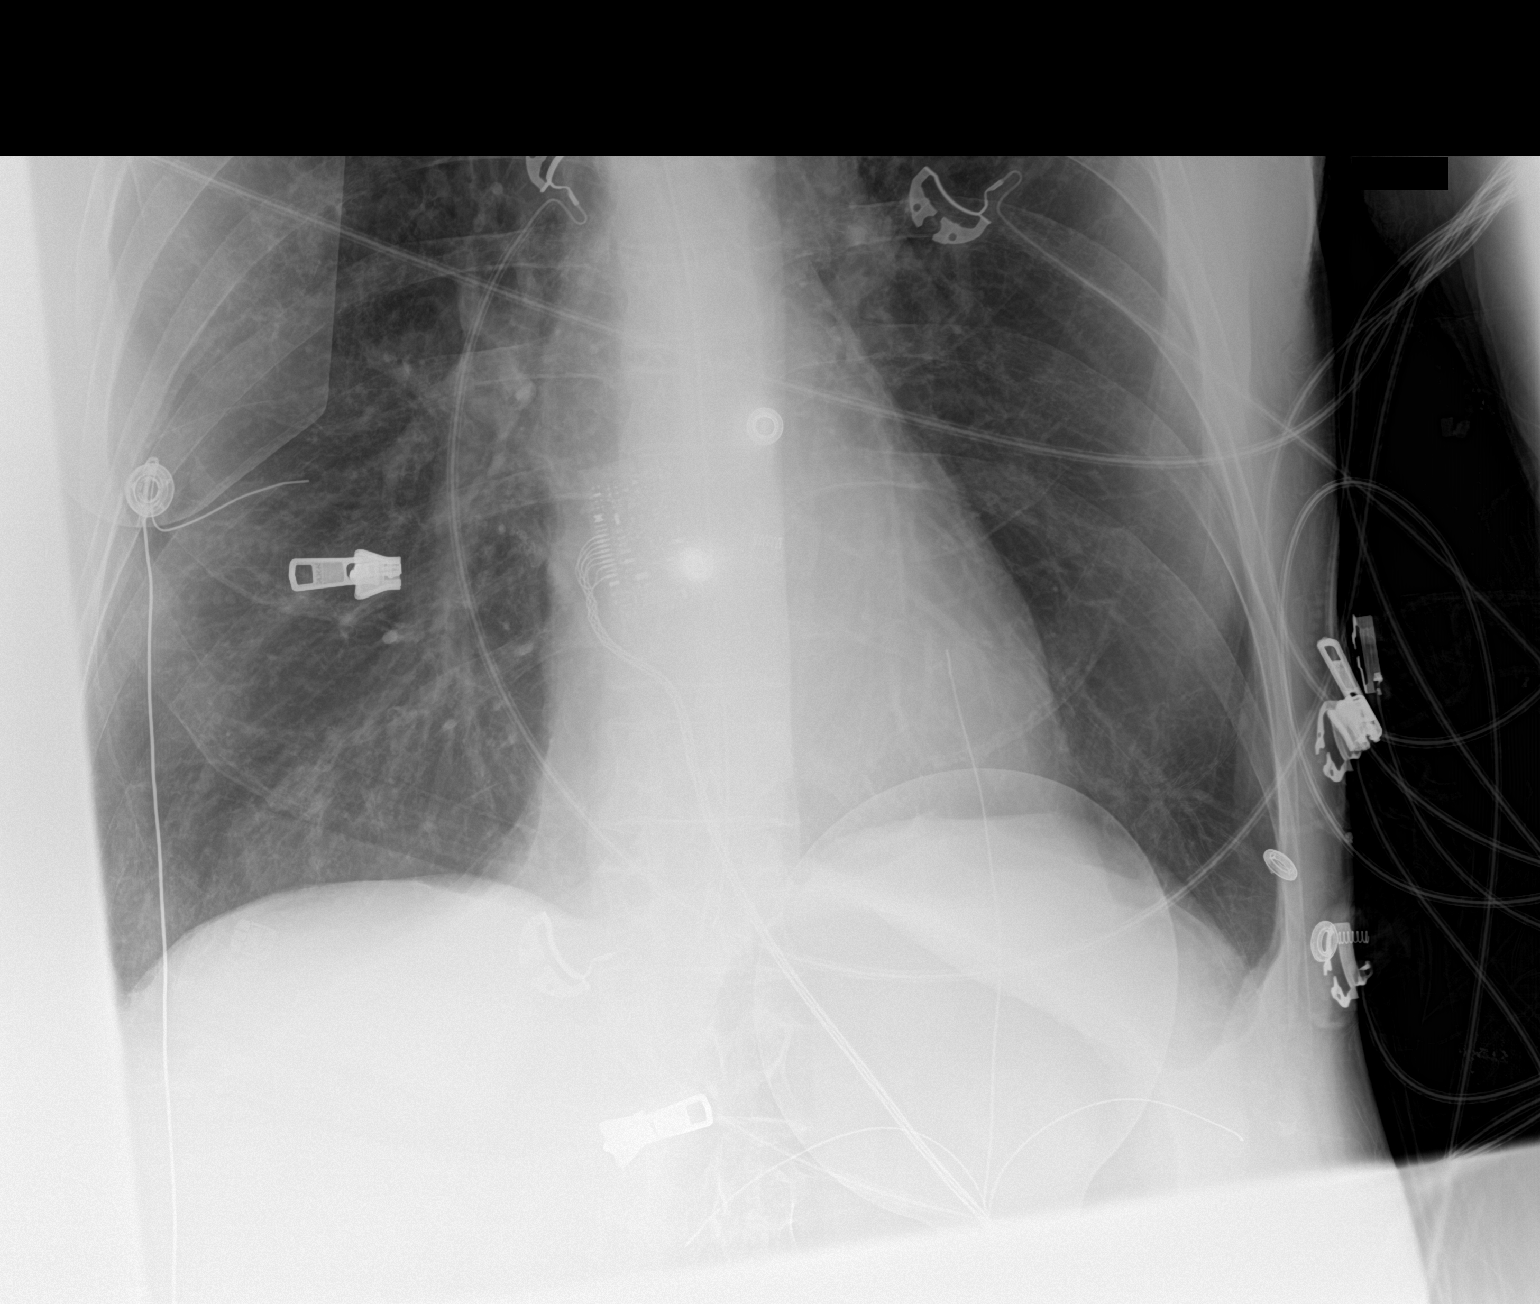

[2 of 2 positions shown; findings below may reference images not displayed]

FINDINGS: Endotracheal tube in good position. NG tip difficult to visualize
but may be in the distal esophagus above the GE junction.

COPD with pulmonary hyperinflation. Negative for heart failure or
pneumonia
IMPRESSION: Endotracheal tube in good position.  COPD.  Lungs are clear

NG tube tip may be in the distal esophagus.

## 2017-09-17 IMAGING — CT CT ANGIO CHEST
1 of 8 series · 18 of 36 positions shown · IV contrast (ISOVUE)
Comparison: Radiographs [DATE]

CLINICAL DATA: Cough and decreased appetite respiratory distress

EXAM:
CT ANGIOGRAPHY CHEST WITH CONTRAST
TECHNIQUE: Multidetector CT imaging of the chest was performed using the
standard protocol during bolus administration of intravenous
contrast. Multiplanar CT image reconstructions and MIPs were
obtained to evaluate the vascular anatomy.
CONTRAST:  100mL [IR] IOPAMIDOL ([IR]) INJECTION 76%

[Series 5: thins · axial · 0.81mm/px · z∈[-138,+189]mm · 18 of 365 slices shown]
[im 19/365  lung]
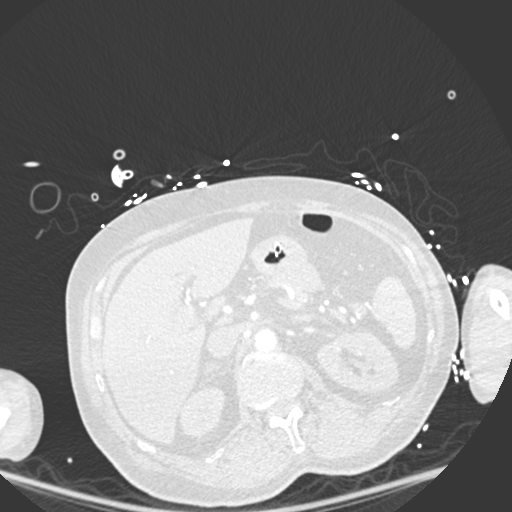
[im 37/365  mediastinal]
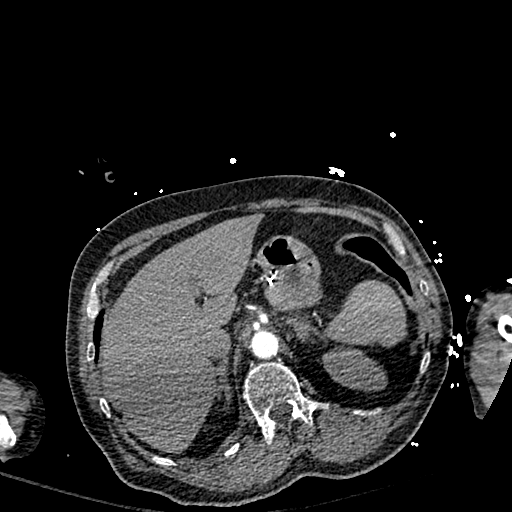
[im 55/365  lung]
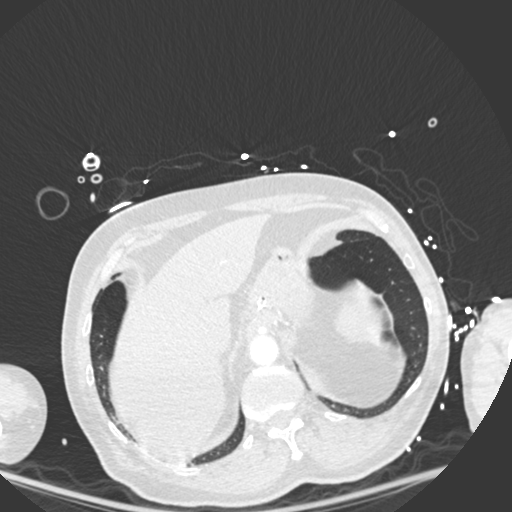
[im 73/365  mediastinal]
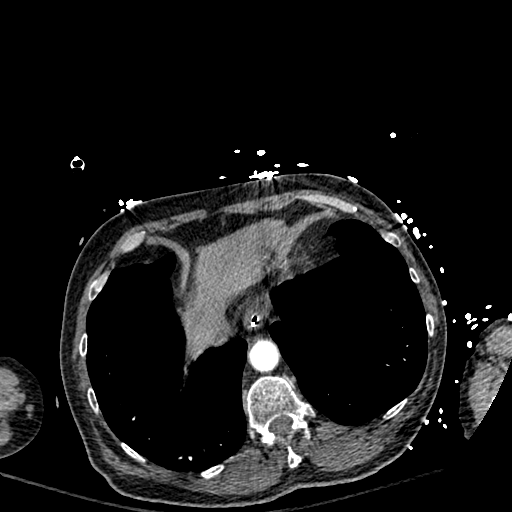
[im 92/365  lung]
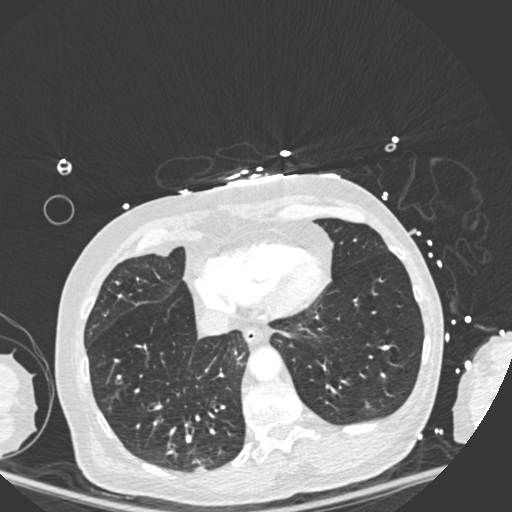
[im 110/365  mediastinal]
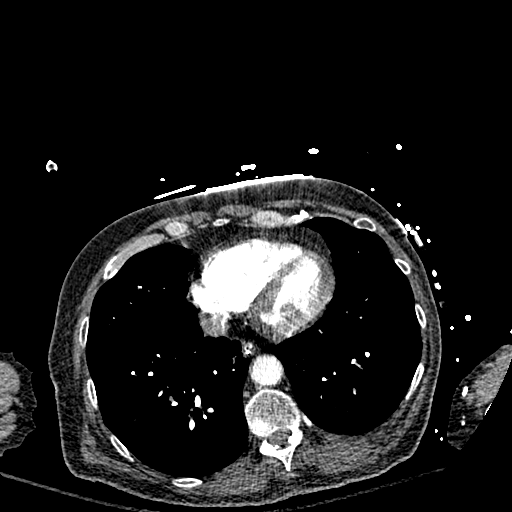
[im 128/365  lung]
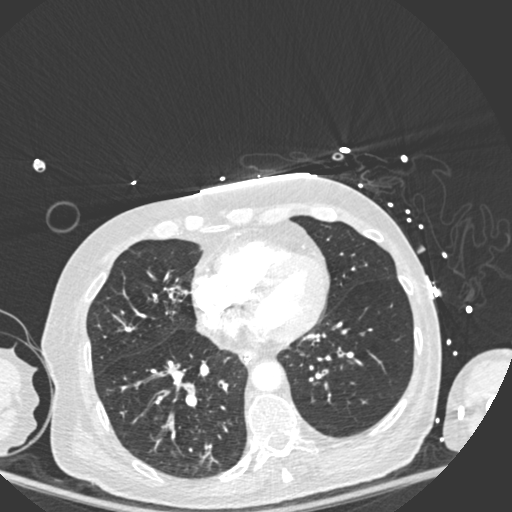
[im 146/365  mediastinal]
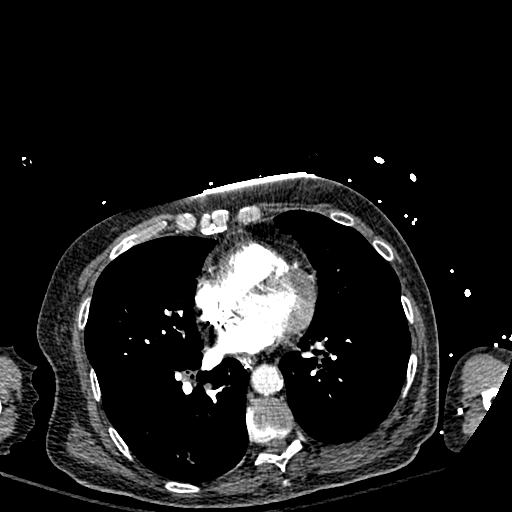
[im 164/365  lung]
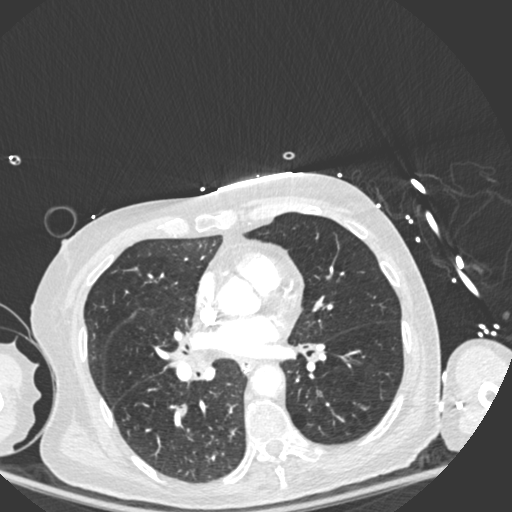
[im 201/365  mediastinal]
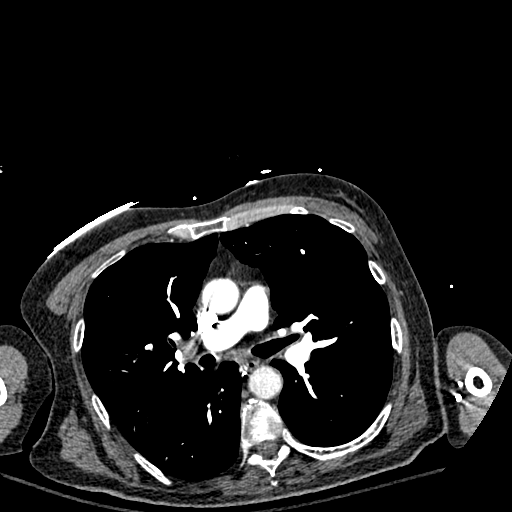
[im 219/365  lung]
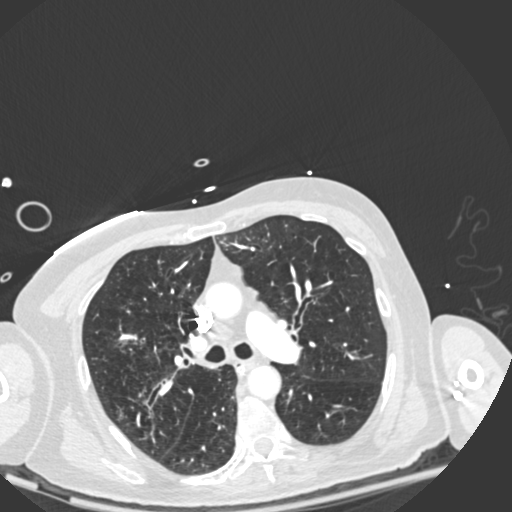
[im 237/365  mediastinal]
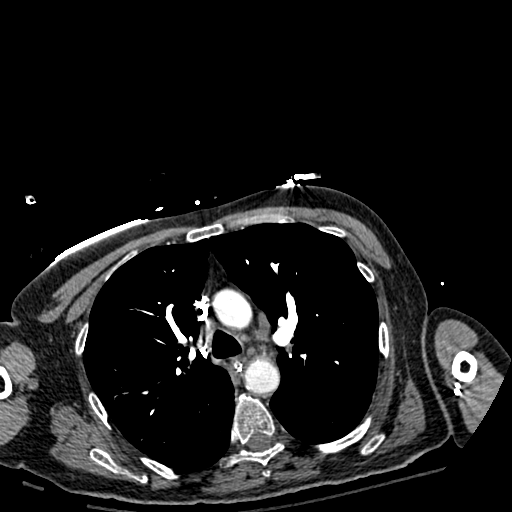
[im 255/365  lung]
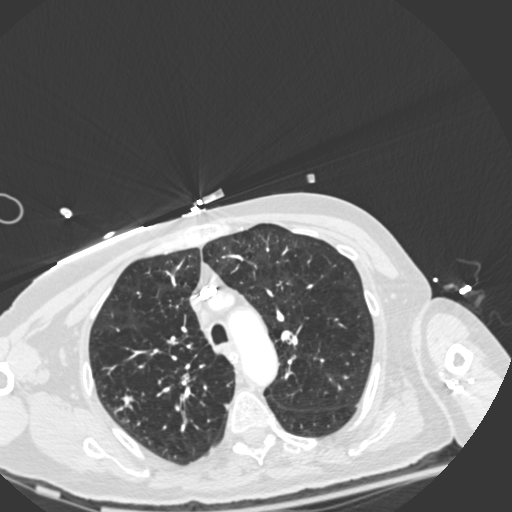
[im 274/365  mediastinal]
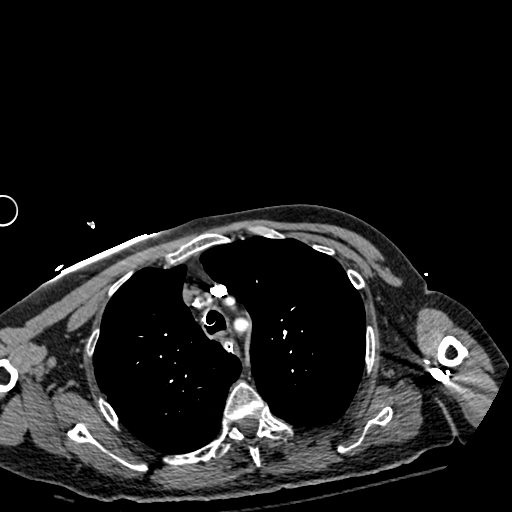
[im 292/365  lung]
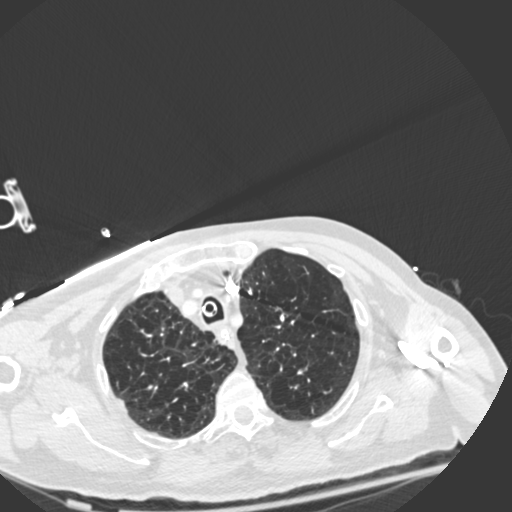
[im 310/365  mediastinal]
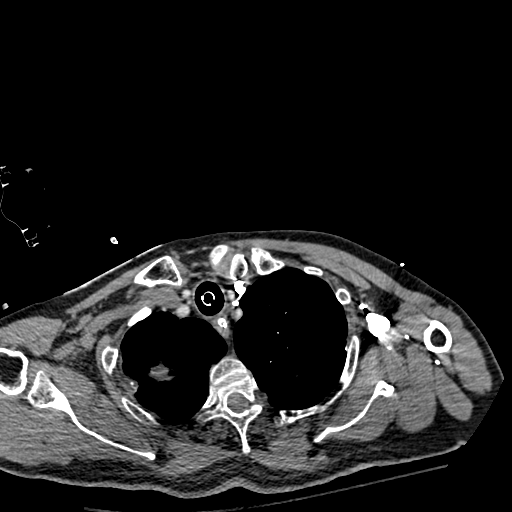
[im 328/365  lung]
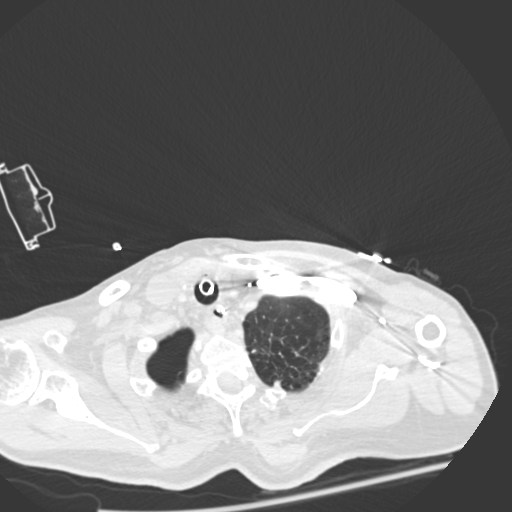
[im 346/365  mediastinal]
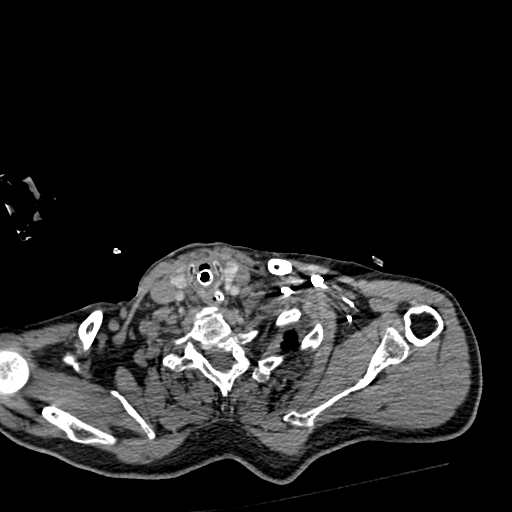

[18 of 36 positions shown; findings below may reference images not displayed]

FINDINGS: Cardiovascular: Satisfactory opacification of the pulmonary arteries
to the segmental level. No evidence of pulmonary embolism.
Nonaneurysmal aorta. No dissection is seen. Moderate aortic
atherosclerosis. Mild coronary calcification. Normal heart size. No
significant pericardial effusion.

Mediastinum/Nodes: Endotracheal tube tip about 3.5 cm superior to
the carina. No thyroid mass. Mild mediastinal lymph nodes, for
example right paratracheal lymph node measures 9 mm. AP window lymph
nodes measuring 9 mm. Precarinal lymph node measures 14 mm. Small
right hilar lymph nodes measuring up to 11 mm. Esophageal tube tip
is in the mid stomach.

Lungs/Pleura: Severe emphysema. Biapical scarring. No pleural
effusion, pneumothorax or consolidative process. Mild peribronchial
thickening. Subtle tree-in-bud densities within the right upper,
right middle and right lower lobes.

Upper Abdomen: Partially visualized cyst or hydronephrosis in the
upper pole of the right kidney

Musculoskeletal: Degenerative changes. No acute or suspicious
lesion.

Review of the MIP images confirms the above findings.
IMPRESSION: 1. Negative for acute pulmonary embolus.
2. Severe emphysema. No focal consolidation or focal airspace
disease. Few subtle foci of mild tree-in-bud density, mostly within
the right lung suggestive of bronchiolitis. Mild peribronchial
thickening, could be due to central airways inflammation.
3. Mild mediastinal lymph nodes, likely reactive
4. Partially visualized cyst versus hydronephrosis in the upper pole
of the right kidney

Aortic Atherosclerosis ([IR]-[IR]) and Emphysema ([IR]-[IR]).

## 2017-09-17 IMAGING — CT CT HEAD W/O CM
3 series · 16 of 47 positions shown, 19 images · non-contrast
Comparison: None.

CLINICAL DATA: Cough and decreased appetite. Respiratory distress
requiring intubation.

EXAM:
CT HEAD WITHOUT CONTRAST
TECHNIQUE: Contiguous axial images were obtained from the base of the skull
through the vertex without intravenous contrast.

[Series 2: head wo · axial · 0.47mm/px · z∈[+301,+446]mm · 10 of 35 slices shown, 13 images]
[im 3/35  brain]
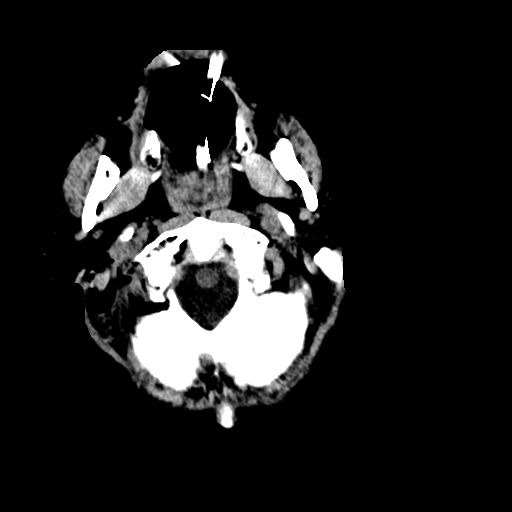
[im 3/35  bone]
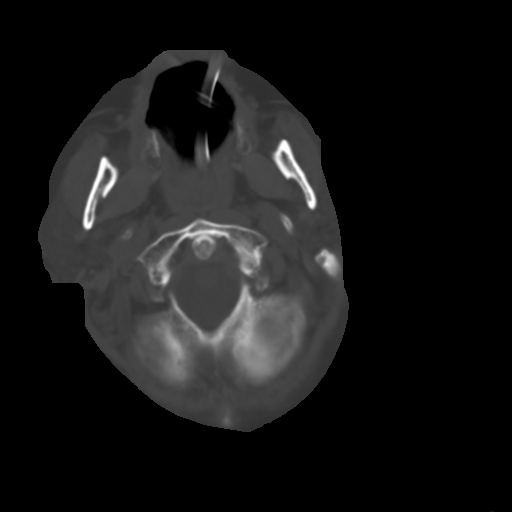
[im 6/35  brain]
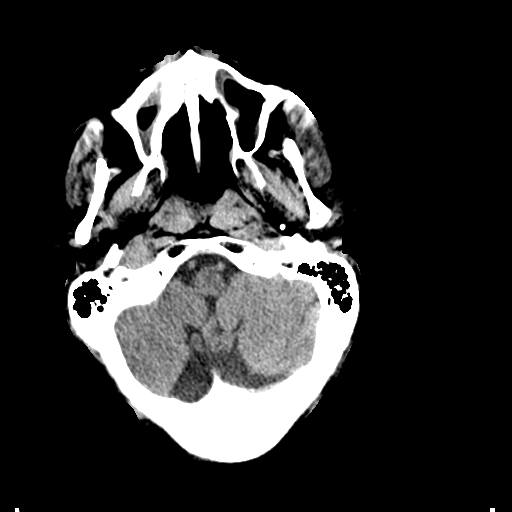
[im 10/35  brain]
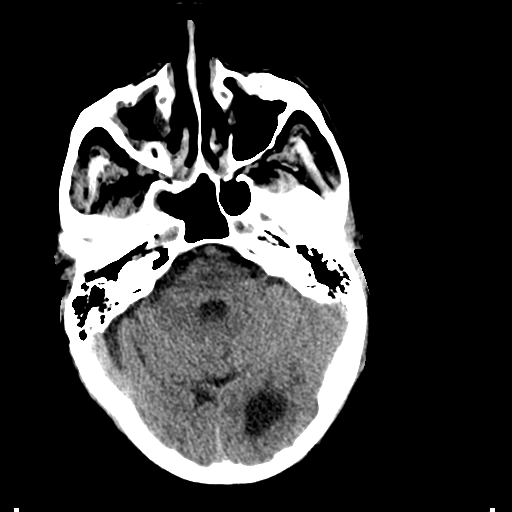
[im 12/35  brain]
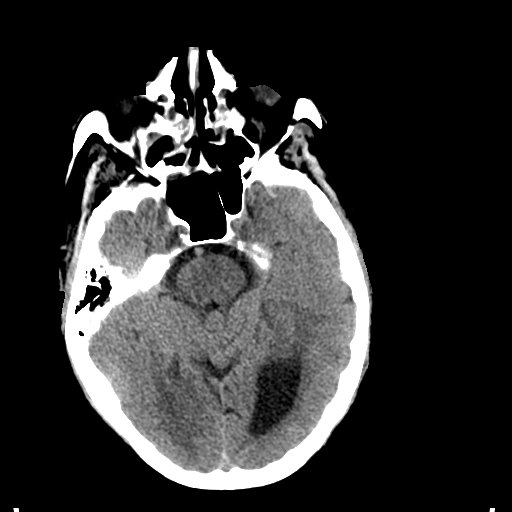
[im 16/35  brain]
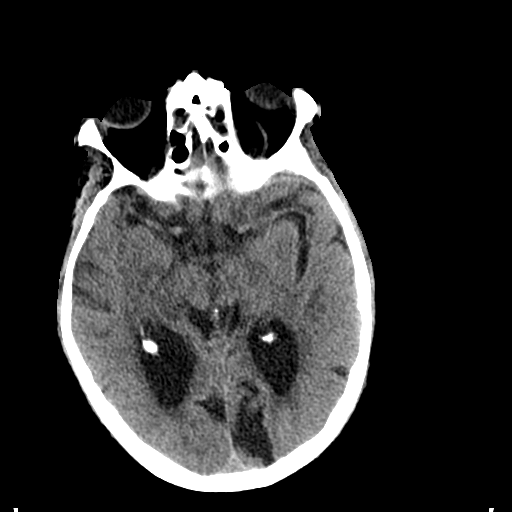
[im 16/35  bone]
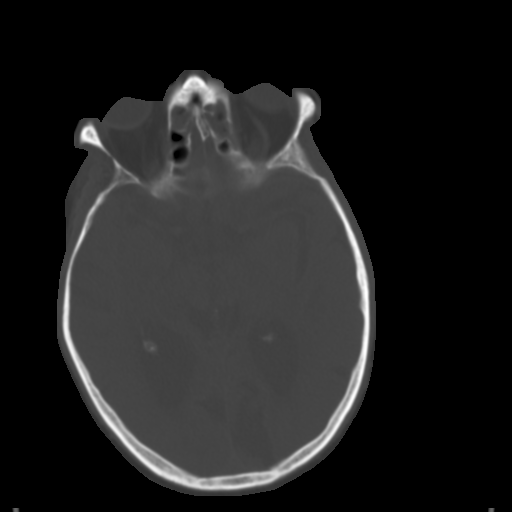
[im 19/35  brain]
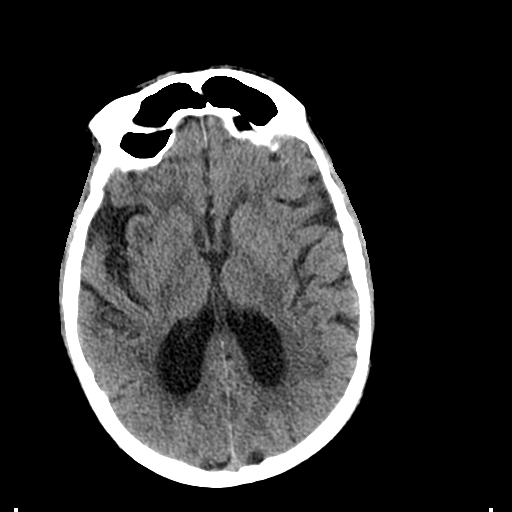
[im 23/35  brain]
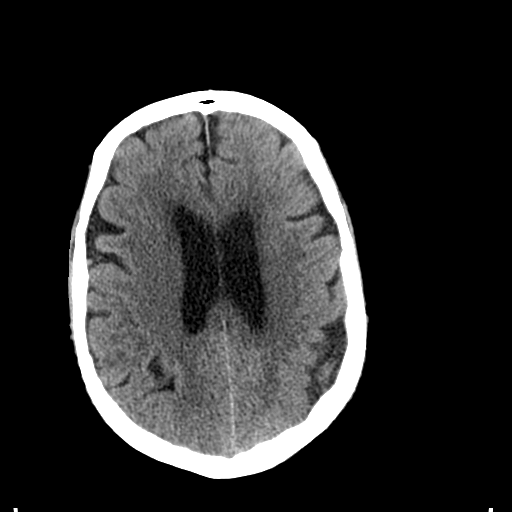
[im 26/35  brain]
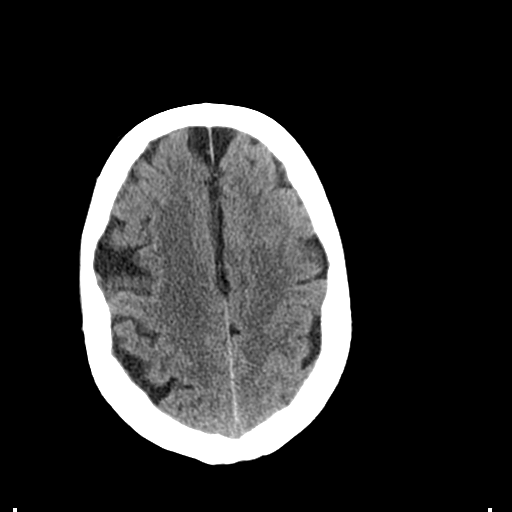
[im 29/35  brain]
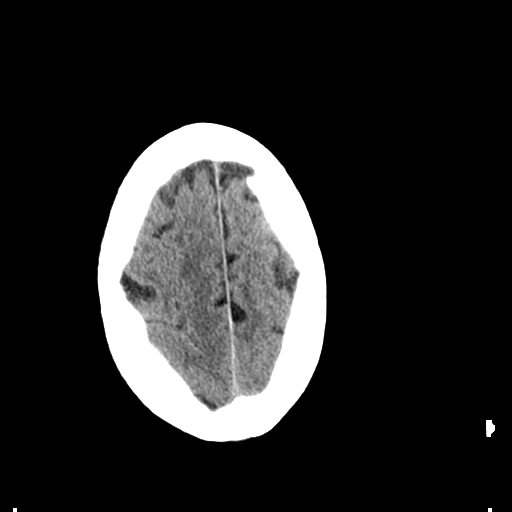
[im 29/35  bone]
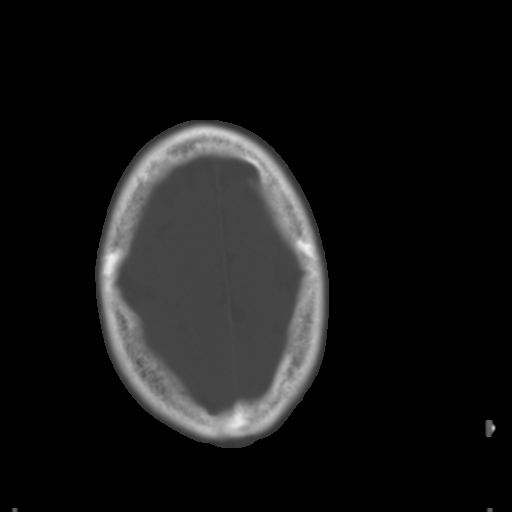
[im 32/35  brain]
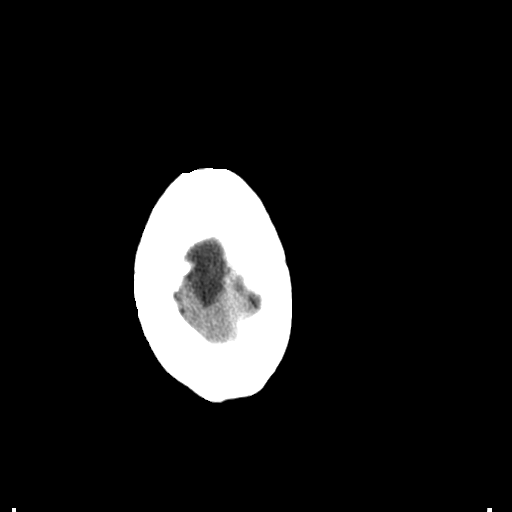

[Series 4: coronal soft tissue · coronal · 0.35mm/px · 3 of 73 slices shown]
[im 25/73  brain]
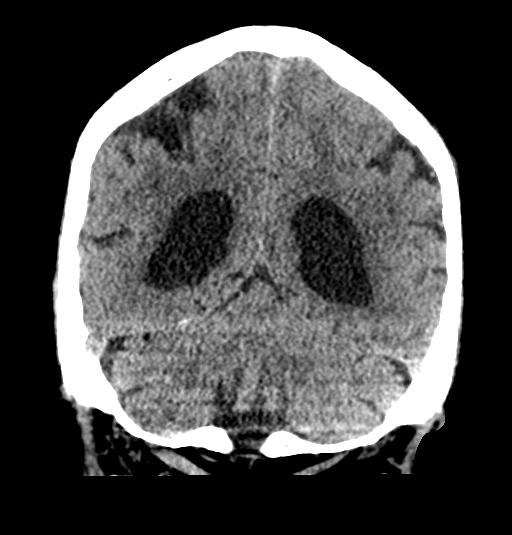
[im 33/73  brain]
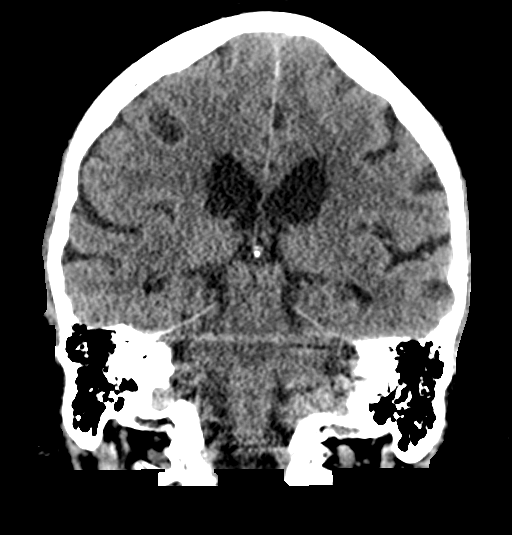
[im 41/73  brain]
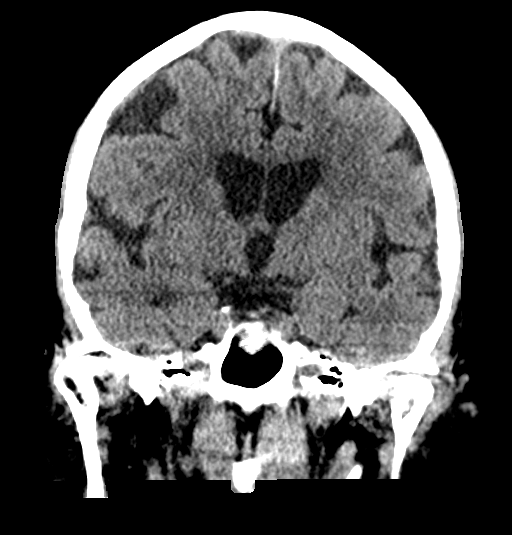

[Series 5: sagittal soft tissue · sagittal · 0.36mm/px · 3 of 55 slices shown]
[im 19/55  brain]
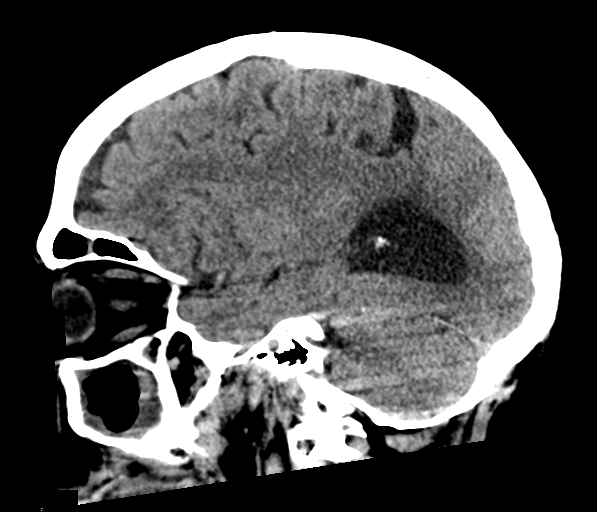
[im 28/55  brain]
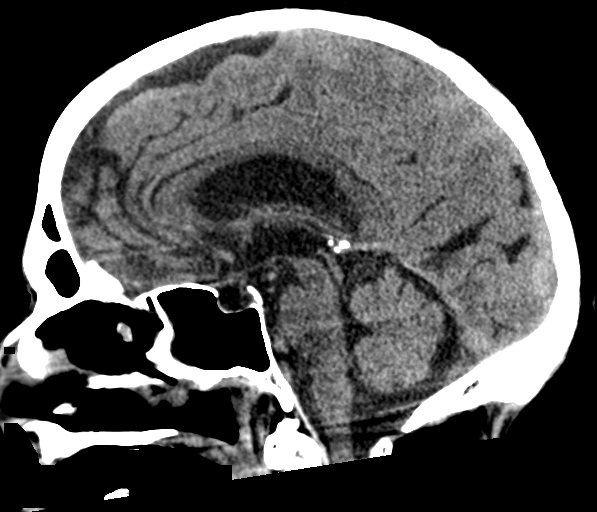
[im 37/55  brain]
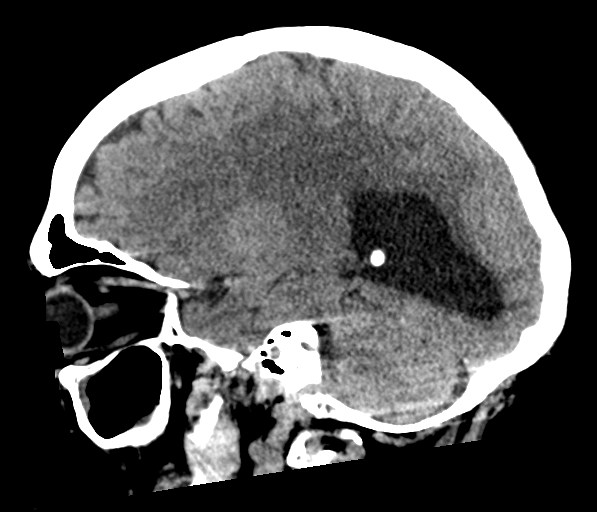

[16 of 47 positions shown; findings below may reference images not displayed]

FINDINGS: Brain: There is no evidence of acute intracranial hemorrhage, mass
lesion, brain edema or extra-axial fluid collection. There is
diffuse prominence of the ventricles and subarachnoid spaces
consistent with atrophy. There is mild low-density in the
periventricular white matter, likely due to chronic small vessel
ischemic changes. There is no CT evidence of acute cortical
infarction.

Vascular: Intracranial vascular calcifications. No hyperdense vessel
identified.

Skull: Negative for fracture or focal lesion.

Sinuses/Orbits: There is mucosal thickening throughout the maxillary
and ethmoid sinuses bilaterally. No definite air-fluid levels. The
mastoid air cells and middle ears are clear. No significant orbital
findings. Patient is intubated.

Other: None.
IMPRESSION: 1. No acute intracranial findings.
2. Atrophy and chronic small vessel ischemic changes.
3. Bilateral maxillary and ethmoid sinus mucosal thickening.

## 2017-09-17 MED ORDER — HEPARIN SODIUM (PORCINE) 5000 UNIT/ML IJ SOLN
5000.0000 [IU] | Freq: Three times a day (TID) | INTRAMUSCULAR | Status: DC
  Administered 2017-09-17 – 2017-09-21 (×11): 5000 [IU] via SUBCUTANEOUS
  Filled 2017-09-17 (×12): qty 1

## 2017-09-17 MED ORDER — SODIUM CHLORIDE 0.9 % IV SOLN
INTRAVENOUS | Status: DC
  Administered 2017-09-17: 22:00:00 via INTRAVENOUS

## 2017-09-17 MED ORDER — SODIUM CHLORIDE 0.9 % IV SOLN
100.0000 mg | Freq: Two times a day (BID) | INTRAVENOUS | Status: DC
  Administered 2017-09-17 – 2017-09-20 (×7): 100 mg via INTRAVENOUS
  Filled 2017-09-17 (×8): qty 100

## 2017-09-17 MED ORDER — PIPERACILLIN-TAZOBACTAM 3.375 G IVPB: 3.3750 g | Freq: Four times a day (QID) | INTRAVENOUS | Status: DC

## 2017-09-17 MED ORDER — PIPERACILLIN-TAZOBACTAM 3.375 G IVPB
3.3750 g | Freq: Three times a day (TID) | INTRAVENOUS | Status: DC
  Administered 2017-09-18: 3.375 g via INTRAVENOUS
  Filled 2017-09-17 (×2): qty 50

## 2017-09-17 MED ORDER — VANCOMYCIN HCL IN DEXTROSE 1-5 GM/200ML-% IV SOLN
1000.0000 mg | Freq: Once | INTRAVENOUS | Status: AC
  Administered 2017-09-17: 1000 mg via INTRAVENOUS
  Filled 2017-09-17: qty 200

## 2017-09-17 MED ORDER — ONDANSETRON HCL 4 MG/2ML IJ SOLN
4.0000 mg | Freq: Four times a day (QID) | INTRAMUSCULAR | Status: DC | PRN
  Administered 2017-09-17: 4 mg via INTRAVENOUS
  Filled 2017-09-17: qty 2

## 2017-09-17 MED ORDER — ACETAMINOPHEN 325 MG PO TABS
650.0000 mg | ORAL_TABLET | ORAL | Status: DC | PRN
  Administered 2017-09-17: 650 mg via ORAL
  Filled 2017-09-17: qty 2

## 2017-09-17 MED ORDER — INSULIN ASPART 100 UNIT/ML ~~LOC~~ SOLN
0.0000 [IU] | Freq: Three times a day (TID) | SUBCUTANEOUS | Status: DC
  Administered 2017-09-18: 2 [IU] via SUBCUTANEOUS
  Administered 2017-09-18 – 2017-09-19 (×4): 3 [IU] via SUBCUTANEOUS
  Administered 2017-09-19 – 2017-09-20 (×2): 2 [IU] via SUBCUTANEOUS
  Administered 2017-09-20: 5 [IU] via SUBCUTANEOUS

## 2017-09-17 MED ORDER — SODIUM CHLORIDE 0.9 % IJ SOLN
INTRAMUSCULAR | Status: AC
  Filled 2017-09-17: qty 50

## 2017-09-17 MED ORDER — NOREPINEPHRINE BITARTRATE 1 MG/ML IV SOLN
0.0000 ug/min | INTRAVENOUS | Status: DC
  Filled 2017-09-17: qty 4

## 2017-09-17 MED ORDER — ASPIRIN 81 MG PO CHEW
324.0000 mg | CHEWABLE_TABLET | ORAL | Status: AC
  Administered 2017-09-17: 324 mg via ORAL
  Filled 2017-09-17: qty 4

## 2017-09-17 MED ORDER — VANCOMYCIN HCL 10 G IV SOLR
1250.0000 mg | INTRAVENOUS | Status: DC
  Filled 2017-09-17: qty 1250

## 2017-09-17 MED ORDER — FENTANYL CITRATE (PF) 100 MCG/2ML IJ SOLN
25.0000 ug | INTRAMUSCULAR | Status: DC | PRN
  Administered 2017-09-17 – 2017-09-18 (×2): 50 ug via INTRAVENOUS
  Filled 2017-09-17 (×2): qty 2

## 2017-09-17 MED ORDER — MIDAZOLAM HCL 2 MG/2ML IJ SOLN
3.0000 mg | Freq: Once | INTRAMUSCULAR | Status: AC
  Administered 2017-09-17: 3 mg via INTRAVENOUS
  Filled 2017-09-17: qty 4

## 2017-09-17 MED ORDER — METHYLPREDNISOLONE SODIUM SUCC 125 MG IJ SOLR
60.0000 mg | Freq: Three times a day (TID) | INTRAMUSCULAR | Status: DC
  Administered 2017-09-17 – 2017-09-19 (×5): 60 mg via INTRAVENOUS
  Filled 2017-09-17 (×6): qty 2

## 2017-09-17 MED ORDER — RANITIDINE HCL 150 MG/10ML PO SYRP
150.0000 mg | ORAL_SOLUTION | Freq: Two times a day (BID) | ORAL | Status: DC
  Administered 2017-09-17 – 2017-09-18 (×2): 150 mg
  Filled 2017-09-17 (×4): qty 10

## 2017-09-17 MED ORDER — IPRATROPIUM-ALBUTEROL 0.5-2.5 (3) MG/3ML IN SOLN
3.0000 mL | RESPIRATORY_TRACT | Status: DC
  Administered 2017-09-17 – 2017-09-19 (×14): 3 mL via RESPIRATORY_TRACT
  Filled 2017-09-17 (×14): qty 3

## 2017-09-17 MED ORDER — FAMOTIDINE 40 MG/5ML PO SUSR: 20.0000 mg | Freq: Two times a day (BID) | ORAL | Status: DC

## 2017-09-17 MED ORDER — NOREPINEPHRINE 4 MG/250ML-% IV SOLN
0.0000 ug/min | INTRAVENOUS | Status: DC
  Administered 2017-09-17 (×2): 20 ug/min via INTRAVENOUS
  Administered 2017-09-17: 2 ug/min via INTRAVENOUS
  Administered 2017-09-18: 10 ug/min via INTRAVENOUS
  Filled 2017-09-17 (×3): qty 250

## 2017-09-17 MED ORDER — SODIUM CHLORIDE 0.9 % IV BOLUS (SEPSIS)
250.0000 mL | Freq: Once | INTRAVENOUS | Status: AC
  Administered 2017-09-17: 250 mL via INTRAVENOUS

## 2017-09-17 MED ORDER — SODIUM CHLORIDE 0.9 % IV SOLN
Freq: Once | INTRAVENOUS | Status: AC
Start: 2017-09-17 — End: 2017-09-17
  Administered 2017-09-17: 17:00:00 via INTRAVENOUS

## 2017-09-17 MED ORDER — PIPERACILLIN-TAZOBACTAM 3.375 G IVPB 30 MIN
3.3750 g | Freq: Once | INTRAVENOUS | Status: AC
  Administered 2017-09-17: 3.375 g via INTRAVENOUS
  Filled 2017-09-17: qty 50

## 2017-09-17 MED ORDER — SODIUM CHLORIDE 0.9 % IV BOLUS (SEPSIS)
1000.0000 mL | Freq: Once | INTRAVENOUS | Status: AC
  Administered 2017-09-17: 1000 mL via INTRAVENOUS

## 2017-09-17 MED ORDER — VANCOMYCIN HCL IN DEXTROSE 1-5 GM/200ML-% IV SOLN: 1000.0000 mg | Freq: Once | INTRAVENOUS | Status: DC

## 2017-09-17 MED ORDER — SODIUM CHLORIDE 0.9 % IV BOLUS (SEPSIS): 1000.0000 mL | Freq: Once | INTRAVENOUS | Status: DC

## 2017-09-17 MED ORDER — IOPAMIDOL (ISOVUE-370) INJECTION 76%
INTRAVENOUS | Status: AC
  Administered 2017-09-17: 100 mL
  Filled 2017-09-17: qty 100

## 2017-09-17 MED ORDER — ASPIRIN 300 MG RE SUPP: 300.0000 mg | RECTAL | Status: AC

## 2017-09-17 NOTE — ED Notes (Signed)
1603 100 mg Succinylcholine administered           20 mg Etomidate administered   1606  ET placed and secured

## 2017-09-17 NOTE — Progress Notes (Signed)
Pharmacy Antibiotic Note  George Frazier is a 72 y.o. male admitted on 09/17/2017 with respiratory failure, recently treated for AECOPD.  Pharmacy has been consulted for Vancomycin dosing; Zosyn per MD  Plan:  Vancomycin 1000 mg IV now, then 1250 mg IV q24 hr (est AUC 506 based on SCr 1.22)  Measure vancomycin AUC at steady state as indicated  Zosyn per MD, dosing appropriate  Daily SCr while on Vanc and Zosyn d/t increased toxicity. Consider substituting Cefepime/Flagyl for Zosyn to minimize nephrotoxicity if clinically appropriate (doesn't cover enterococcus)   Height: 5\' 10"  (177.8 cm) Weight: 160 lb (72.6 kg) IBW/kg (Calculated) : 73  Temp (24hrs), Avg:99.2 F (37.3 C), Min:98.1 F (36.7 C), Max:100 F (37.8 C)  Recent Labs  Lab 09/17/17 1603 09/17/17 1604 09/17/17 1848  WBC 12.3*  --   --   CREATININE 1.22  --   --   LATICACIDVEN  --  7.58* 5.83*    Estimated Creatinine Clearance: 57 mL/min (by C-G formula based on SCr of 1.22 mg/dL).    No Known Allergies    Thank you for allowing pharmacy to be a part of this patient's care.  Reuel Boom, PharmD, BCPS [phone] 09/17/2017, 7:54 PM

## 2017-09-17 NOTE — Progress Notes (Signed)
Patient arrived on floor at 2100

## 2017-09-17 NOTE — ED Notes (Signed)
VERIFIED WITH HOLLY L RN VITALS. THIS VITALS ARE STABLE. WILL KEEP LEVOPHED AT CURRENT RATE

## 2017-09-17 NOTE — H&P (Addendum)
George Frazier, George Frazier Male, 72 y.o., 06/15/1946   Chart reviewed, patient seen and examined.  Hx from chart as he is intubated. Son George Frazier  [phone]- called for info and update.   CC: Shortness of breath, cough- few days   HPI:  72 y/o male, h/o Emphysema, COPD, on home 02,  Recently hospitalized for AECOPD, sent home on antibx and steroids; Was ill for few days; was in the process of getting cxr.  When presented to ED, he was diaphoretic, hypoxic, lethargic,  ED intubated him; following this he had been hypotensive. LA was 7. He is waiting to get CT head and CTPA.  Purulent secretions from ET Tube suctioned.  Prior records- showed pt was DNR.  Now son confirms he is full code, but would not want him to be in a vegetative state.     has a past medical history of CHF (congestive heart failure) ([facility]) and COPD (chronic obstructive pulmonary disease) ([place]).    has no past surgical history on file.   FH-COPD, CAD- per chart  Social Hx- Ex smoker No sick contacts/travel/pets per son. Per son- pt did not receive flu vaccine or pneumovax     Current Facility-Administered Medications:  .  0.9 %  sodium chloride infusion, , Intravenous, Continuous, Sivakumar, Siva P, MD .  acetaminophen (TYLENOL) tablet 650 mg, 650 mg, Oral, Q4H PRN, Sivakumar, Siva P, MD .  aspirin chewable tablet 324 mg, 324 mg, Oral, NOW **OR** aspirin suppository 300 mg, 300 mg, Rectal, NOW, Sivakumar, Siva P, MD .  famotidine (PEPCID) 40 MG/5ML suspension 20 mg, 20 mg, Per Tube, BID, Sivakumar, Siva P, MD .  heparin injection 5,000 Units, 5,000 Units, Subcutaneous, Q8H, Sivakumar, Siva P, MD .  iopamidol (ISOVUE-370) 76 % injection, , , ,  .  ipratropium-albuterol (DUONEB) 0.5-2.5 (3) MG/3ML nebulizer solution 3 mL, 3 mL, Nebulization, Q4H, Sivakumar, Siva P, MD .  methylPREDNISolone sodium succinate (SOLU-MEDROL) 125 mg/2 mL injection 60 mg, 60 mg, Intravenous, Q8H, Sivakumar, Siva P, MD .   norepinephrine (LEVOPHED) 4mg  in D5W 244mL premix infusion, 0-40 mcg/min, Intravenous, Titrated, Julianne Rice, MD, Last Rate: 7.5 mL/hr at 09/17/17 1841, 2 mcg/min at 09/17/17 1841 .  piperacillin-tazobactam (ZOSYN) IVPB 3.375 g, 3.375 g, Intravenous, Q6H, Sivakumar, Siva P, MD .  sodium chloride 0.9 % injection, , , ,  .  vancomycin (VANCOCIN) IVPB 1000 mg/200 mL premix, 1,000 mg, Intravenous, Once, Wofford, Cindie Laroche, RPH  Current Outpatient Medications:  .  albuterol (PROVENTIL HFA;VENTOLIN HFA) 108 (90 Base) MCG/ACT inhaler, Inhale 2 puffs into the lungs every 6 (six) hours as needed for wheezing or shortness of breath., Disp: 1 Inhaler, Rfl: 0 .  budesonide-formoterol (SYMBICORT) 160-4.5 MCG/ACT inhaler, Inhale 2 puffs into the lungs 2 (two) times daily at 10 AM and 5 PM., Disp: 1 Inhaler, Rfl: 0 .  guaiFENesin (MUCINEX) 600 MG 12 hr tablet, Take 1 tablet (600 mg total) by mouth 2 (two) times daily as needed for cough or to loosen phlegm. (Patient not taking: Reported on 07/27/2017), Disp: 30 tablet, Rfl: 0 .  predniSONE (DELTASONE) 10 MG tablet, Take 4 tablets for 3 days; Take 3 tablets for 4 days; Take 2 tablets for 3 days; Take 1 tablet for 4 days, Disp: 34 tablet, Rfl: 0  Home meds revwd  ROS- unobtainable - as pt is on vent   Physical examination.  T 57F, Pulse 90, regular, BP 80/76mmhg, Sp02.Marland Kitchen99%  Vent Fi02- 50%, PEEP- 5, Rate- 20, Vt 549ml, PIP-  30s, NO  autoPEEP  General appearance - Orally intubated, looks older than stated age   HEENT-Oral ETT, OGT No pallor/icterus No nasal congestion or drainage No swelling in the neck  Cardiovascular  S1 and S2 regular,    no Murmur/gallop   Respiratory Chest expansion equal Breath sounds equal and clear Few  rales    Abdomen   Soft,nondistended, No organomegaly,No mass Bowel sounds+   Extremities No swelling,No clubbing Capillary refill < 2 sec Peripheral pulses diminished femorals, Unable to feel  PT/DP  Neurological Tone normal Unable to assess- got versed    POCUS-  IVC -full and non collapsible ( after 2+ lit ivfluids)  Thickened LV, adequate contractility  NO RV dilatation or Pericardial effusion   Labs CBC Latest Ref Rng & Units 09/17/2017 07/18/2017 07/17/2017  WBC 4.0 - 10.5 K/uL 12.3(H) 7.7 10.5  Hemoglobin 13.0 - 17.0 g/dL 14.7 14.6 14.2  Hematocrit 39.0 - 52.0 % 48.2 50.2 48.2  Platelets 150 - 400 K/uL 318 481(H) 456(H)    BMP Latest Ref Rng & Units 09/17/2017 07/18/2017 07/17/2017  Glucose 65 - 99 mg/dL 198(H) 115(H) 124(H)  BUN 6 - 20 mg/dL 14 28(H) 31(H)  Creatinine 0.61 - 1.24 mg/dL 1.22 0.96 1.07  Sodium 135 - 145 mmol/L 139 142 142  Potassium 3.5 - 5.1 mmol/L 5.1 4.8 5.0  Chloride 101 - 111 mmol/L 97(L) 97(L) 101  CO2 22 - 32 mmol/L 27 38(H) 35(H)  Calcium 8.9 - 10.3 mg/dL 8.6(L) 8.4(L) 8.4(L)    arterial blood gases .Results for George Frazier [facility] (MRN [id number]) as of 09/17/2017 18:48  Ref. Range 09/17/2017 16:14 09/17/2017 16:21 09/17/2017 16:42 09/17/2017 18:24  pH, Arterial Latest Ref Range: 7.350 - 7.450  7.141 (LL)   7.330 (L)  pCO2 arterial Latest Ref Range: 32.0 - 48.0 mmHg 82.2 (HH)   55.4 (H)  pO2, Arterial Latest Ref Range: 83.0 - 108.0 mmHg 400 (H)   97.3  Acid-Base Excess Latest Ref Range: 0.0 - 2.0 mmol/L    2.0  Acid-base deficit Latest Ref Range: 0.0 - 2.0 mmol/L 4.2 (H)     Bicarbonate Latest Ref Range: 20.0 - 28.0 mmol/L 26.9   28.4 (H)   Xray Chest 09/17/17 Endotracheal tube in good position. NG tip difficult to visualize but may be in the distal esophagus above the GE junction. (advanced in per RN) COPD with pulmonary hyperinflation. Negative for heart failure or pneumonia  EKG-    Sinus rhythm Probable left atrial enlargement Right axis deviation Borderline low voltage, extremity leads Prolonged QTc    TTE 07/15/2017 - Left ventricle: The cavity size was normal. Wall thickness was   normal. Systolic function was normal. The  estimated ejection   fraction was in the range of 55% to 60%. Wall motion was normal;   there were no regional wall motion abnormalities. Doppler   parameters are consistent with abnormal left ventricular   relaxation (grade 1 diastolic dysfunction). - Left atrium: The atrium was mildly dilated. - Atrial septum: There was an atrial septal aneurysm.  3/4- CT chest- cerebral atrophy  3/4 CT chest- no PE; Emphysema bulla RUL, bronchiectasis/bronchitis/bronchiolitis- right lung    Assessment and Plan:  Severe COPD, Emphysema, acute hypoxic, hypercapnic resp failure  Severe sepsis/shock, lactic acidosis  Hypotension     Neurology Hold further sedation CT head pending  Cardiology Likely needs pressors to keep MAP > 26mmHg Start NE Central line if high doses are needed  Pulmonary Check flu , RVP, sputum c/s,  Pneumo Ag &  Legionella Ag (urine)  and  Serum Mycoplasma IgM Vanco+ Zosyn Iv solumedrol Duoneb Full vent support F/u ABG, CXR   GI /nutrition NPO   Renal, fluids, electrolytes Monitor lytes, renal fxn   Hematology dvt prophlx  Endocrinology Monitor sugars  Infectious diseases F/u c/s Vanco+ zosyn Doxy  Added to cover atypicals (avoiding macrolides/quinolone due to prolonged QTc)   Lines and devices 3/4- ETT, OGT, Foley   CODE STATUS  -FULL per son   Son updated on critical condition    Signed out to E-Link: Dr Nelda Marseille    Thank you for letting me participate in the care of your patient.  The patient is critically ill with multiple organ systems failure and requires high complexity decision making for assessment and support, frequent evaluation and titration of therapies, application of advanced monitoring technologies and extensive interpretation of multiple databases like  review of chart, labs, imaging, coordinating care with other physicians and healthcare team members.. ?  Critical Care Time devoted to patient care services described in  this note is 65 Minutes.?This time reflects time of care of this signee. This critical care time does not reflect procedure time, or teaching time or supervisory time of NP, but could involve care discussion time.  Note subject to typographical and grammatical errors;  Any formal questions or concerns about the content, text, or information contained within the body of this dictation  should be directly addressed to the physician  for  clarification.  Evans Lance, MD Pulmonary and [place] Pager: [phone]

## 2017-09-17 NOTE — ED Provider Notes (Signed)
[facility]-ICU/STEPDOWN Provider Note   CSN: [id number] Arrival date & time: 09/17/17  1555     History   Chief Complaint Chief Complaint  Patient presents with  . Respiratory Distress    HPI Win Guajardo is a 72 y.o. male.  HPI Patient recently diagnosed with COPD and placed on 2 L of home O2.  Started having increased shortness of breath and cough for the past 3 days.  Has had low-grade fever per son.  Went to see his primary physician today and was given a breathing treatment.  Advised to come to the emergency department for chest x-ray to rule out pneumonia.  Since states that in route patient became less responsive and diaphoretic with increased work of breathing.  Eventually loss consciousness and had to be moved from car into the emergency department.  Patient is unable to contribute history.  Level 5 caveat applies. Past Medical History:  Diagnosis Date  . CHF (congestive heart failure) ([place])   . COPD (chronic obstructive pulmonary disease) [facility])     Patient Active Problem List   Diagnosis Date Noted  . Acute respiratory failure with hypoxia ([place]) 09/17/2017  . CAP (community acquired pneumonia) 07/14/2017  . COPD (chronic obstructive pulmonary disease) ([place]) 07/14/2017  . Heart failure ([place]) 07/14/2017    History reviewed. No pertinent surgical history.     Home Medications    Prior to Admission medications   Medication Sig Start Date End Date Taking? Authorizing Provider  albuterol (PROVENTIL HFA;VENTOLIN HFA) 108 (90 Base) MCG/ACT inhaler Inhale 2 puffs into the lungs every 6 (six) hours as needed for wheezing or shortness of breath. 07/18/17  Yes Patrecia Pour, Christean Grief, MD  budesonide-formoterol [facility]) 160-4.5 MCG/ACT inhaler Inhale 2 puffs into the lungs 2 (two) times daily at 10 AM and 5 PM. 07/18/17  Yes Patrecia Pour, Christean Grief, MD  guaiFENesin (MUCINEX) 600 MG 12 hr tablet Take 1 tablet (600 mg total) by mouth 2 (two) times daily as  needed for cough or to loosen phlegm. Patient not taking: Reported on 07/27/2017 07/18/17   Patrecia Pour, Christean Grief, MD  predniSONE (DELTASONE) 10 MG tablet Take 4 tablets for 3 days; Take 3 tablets for 4 days; Take 2 tablets for 3 days; Take 1 tablet for 4 days Patient not taking: Reported on 09/17/2017 07/18/17   Doreatha Lew, MD    Family History Family History  Problem Relation Age of Onset  . CAD Mother 61  . COPD Father 31  . AAA (abdominal aortic aneurysm) Father   . Cancer Neg Hx     Social History Social History   Tobacco Use  . Smoking status: Former Smoker    Packs/day: 1.00    Years: 40.00    Pack years: 40.00    Last attempt to quit: 2008    Years since quitting: 11.1  . Smokeless tobacco: Former Network engineer Use Topics  . Alcohol use: No    Frequency: Never  . Drug use: No     Allergies   Patient has no known allergies.   Review of Systems Review of Systems  Unable to perform ROS: Mental status change     Physical Exam Updated Vital Signs BP 103/82   Pulse 69   Temp 100.2 F (37.9 C)   Resp 18   Ht 5\' 10"  (1.778 m)   Wt 72.6 kg (160 lb)   SpO2 100%   BMI 22.96 kg/m   Physical Exam  Constitutional: He appears  well-developed and well-nourished.  HENT:  Head: Normocephalic and atraumatic.  Mouth/Throat: Oropharynx is clear and moist.  No evidence of trauma.  No intraoral lesions.  Left temporal basal cell carcinoma.  Eyes: EOM are normal. Pupils are equal, round, and reactive to light.  Pinpoint pupils bilaterally.  Neck: Normal range of motion. Neck supple. JVD present.  No meningismus  Cardiovascular: Normal rate and regular rhythm.  Pulmonary/Chest:  Abdominal breathing, very shallow.  Diminished air movement throughout.  Abdominal: Soft. Bowel sounds are normal. There is no tenderness. There is no rebound and no guarding.  Musculoskeletal: Normal range of motion. He exhibits no edema or tenderness.  Swelling asymmetry or tenderness.   Distal pulses are 2+.  Neurological:  Unresponsive with shallow breathing.  Skin: Skin is warm. No rash noted. He is diaphoretic. No erythema.  Cyanotic  Nursing note and vitals reviewed.    ED Treatments / Results  Labs (all labs ordered are listed, but only abnormal results are displayed) Labs Reviewed  CBC WITH DIFFERENTIAL/PLATELET - Abnormal; Notable for the following components:      Result Value   WBC 12.3 (*)    MCV 100.4 (*)    Monocytes Absolute 2.7 (*)    All other components within normal limits  COMPREHENSIVE METABOLIC PANEL - Abnormal; Notable for the following components:   Chloride 97 (*)    Glucose, Bld 198 (*)    Calcium 8.6 (*)    Total Bilirubin 1.8 (*)    GFR calc non Af Amer 58 (*)    All other components within normal limits  BRAIN NATRIURETIC PEPTIDE - Abnormal; Notable for the following components:   B Natriuretic Peptide 403.4 (*)    All other components within normal limits  BLOOD GAS, ARTERIAL - Abnormal; Notable for the following components:   pH, Arterial 7.141 (*)    pCO2 arterial 82.2 (*)    pO2, Arterial 400 (*)    Acid-base deficit 4.2 (*)    All other components within normal limits  URINALYSIS, ROUTINE W REFLEX MICROSCOPIC - Abnormal; Notable for the following components:   Glucose, UA 50 (*)    Protein, ur 100 (*)    Bacteria, UA RARE (*)    All other components within normal limits  BLOOD GAS, ARTERIAL - Abnormal; Notable for the following components:   pH, Arterial 7.330 (*)    pCO2 arterial 55.4 (*)    Bicarbonate 28.4 (*)    All other components within normal limits  I-STAT CG4 LACTIC ACID, ED - Abnormal; Notable for the following components:   Lactic Acid, Venous 7.58 (*)    All other components within normal limits  CBG MONITORING, ED - Abnormal; Notable for the following components:   Glucose-Capillary 191 (*)    All other components within normal limits  I-STAT CG4 LACTIC ACID, ED - Abnormal; Notable for the following  components:   Lactic Acid, Venous 5.83 (*)    All other components within normal limits  CULTURE, BLOOD (ROUTINE X 2)  CULTURE, BLOOD (ROUTINE X 2)  RESPIRATORY PANEL BY PCR  CULTURE, RESPIRATORY (NON-EXPECTORATED)  INFLUENZA PANEL BY PCR (TYPE A & B)  BLOOD GAS, ARTERIAL  CBC  BASIC METABOLIC PANEL  MAGNESIUM  PHOSPHORUS  LACTIC ACID, PLASMA  LACTIC ACID, PLASMA  HEPATIC FUNCTION PANEL  I-STAT CHEM 8, ED  I-STAT TROPONIN, ED    EKG  EKG Interpretation  Date/Time:  Monday September 17 2017 16:42:32 EST Ventricular Rate:  75 PR Interval:  QRS Duration: 92 QT Interval:  450 QTC Calculation: 503 R Axis:   93 Text Interpretation:  Sinus rhythm Probable left atrial enlargement Right axis deviation Borderline low voltage, extremity leads Prolonged QT interval Confirmed by Julianne Rice [phone]) on 09/17/2017 4:48:05 PM       Radiology Ct Head Wo Contrast  Result Date: 09/17/2017 CLINICAL DATA:  Cough and decreased appetite. Respiratory distress requiring intubation. EXAM: CT HEAD WITHOUT CONTRAST TECHNIQUE: Contiguous axial images were obtained from the base of the skull through the vertex without intravenous contrast. COMPARISON:  None. FINDINGS: Brain: There is no evidence of acute intracranial hemorrhage, mass lesion, brain edema or extra-axial fluid collection. There is diffuse prominence of the ventricles and subarachnoid spaces consistent with atrophy. There is mild low-density in the periventricular white matter, likely due to chronic small vessel ischemic changes. There is no CT evidence of acute cortical infarction. Vascular: Intracranial vascular calcifications. No hyperdense vessel identified. Skull: Negative for fracture or focal lesion. Sinuses/Orbits: There is mucosal thickening throughout the maxillary and ethmoid sinuses bilaterally. No definite air-fluid levels. The mastoid air cells and middle ears are clear. No significant orbital findings. Patient is intubated. Other:  None. IMPRESSION: 1. No acute intracranial findings. 2. Atrophy and chronic small vessel ischemic changes. 3. Bilateral maxillary and ethmoid sinus mucosal thickening. Electronically Signed   By: Richardean Sale M.D.   On: 09/17/2017 21:02   Ct Angio Chest Pe W And/or Wo Contrast  Result Date: 09/17/2017 CLINICAL DATA:  Cough and decreased appetite respiratory distress EXAM: CT ANGIOGRAPHY CHEST WITH CONTRAST TECHNIQUE: Multidetector CT imaging of the chest was performed using the standard protocol during bolus administration of intravenous contrast. Multiplanar CT image reconstructions and MIPs were obtained to evaluate the vascular anatomy. CONTRAST:  153mL ISOVUE-370 IOPAMIDOL (ISOVUE-370) INJECTION 76% COMPARISON:  Radiographs 09/17/2017 FINDINGS: Cardiovascular: Satisfactory opacification of the pulmonary arteries to the segmental level. No evidence of pulmonary embolism. Nonaneurysmal aorta. No dissection is seen. Moderate aortic atherosclerosis. Mild coronary calcification. Normal heart size. No significant pericardial effusion. Mediastinum/Nodes: Endotracheal tube tip about 3.5 cm superior to the carina. No thyroid mass. Mild mediastinal lymph nodes, for example right paratracheal lymph node measures 9 mm. AP window lymph nodes measuring 9 mm. Precarinal lymph node measures 14 mm. Small right hilar lymph nodes measuring up to 11 mm. Esophageal tube tip is in the mid stomach. Lungs/Pleura: Severe emphysema. Biapical scarring. No pleural effusion, pneumothorax or consolidative process. Mild peribronchial thickening. Subtle tree-in-bud densities within the right upper, right middle and right lower lobes. Upper Abdomen: Partially visualized cyst or hydronephrosis in the upper pole of the right kidney Musculoskeletal: Degenerative changes. No acute or suspicious lesion. Review of the MIP images confirms the above findings. IMPRESSION: 1. Negative for acute pulmonary embolus. 2. Severe emphysema. No focal  consolidation or focal airspace disease. Few subtle foci of mild tree-in-bud density, mostly within the right lung suggestive of bronchiolitis. Mild peribronchial thickening, could be due to central airways inflammation. 3. Mild mediastinal lymph nodes, likely reactive 4. Partially visualized cyst versus hydronephrosis in the upper pole of the right kidney Aortic Atherosclerosis (ICD10-I70.0) and Emphysema (ICD10-J43.9). Electronically Signed   By: Donavan Foil M.D.   On: 09/17/2017 21:09   Dg Chest Portable 1 View  Result Date: 09/17/2017 CLINICAL DATA:  Intubation post code EXAM: PORTABLE CHEST 1 VIEW COMPARISON:  09/12/2017 FINDINGS: Endotracheal tube in good position. NG tip difficult to visualize but may be in the distal esophagus above the GE junction. COPD with pulmonary  hyperinflation. Negative for heart failure or pneumonia IMPRESSION: Endotracheal tube in good position.  COPD.  Lungs are clear NG tube tip may be in the distal esophagus. Electronically Signed   By: Franchot Gallo M.D.   On: 09/17/2017 16:25    Procedures Procedure Name: Intubation Date/Time: 09/17/2017 4:00 PM Performed by: Julianne Rice, MD Pre-anesthesia Checklist: Patient identified, Patient being monitored, Emergency Drugs available, Timeout performed and Suction available Oxygen Delivery Method: Ambu bag Preoxygenation: Pre-oxygenation with 100% oxygen Induction Type: Rapid sequence Ventilation: Mask ventilation without difficulty Laryngoscope Size: Glidescope and 4 Tube size: 7.5 mm Number of attempts: 1 Placement Confirmation: ETT inserted through vocal cords under direct vision,  CO2 detector and Breath sounds checked- equal and bilateral Secured at: 23 cm Tube secured with: ETT holder     .Critical Care Performed by: Julianne Rice, MD Authorized by: Julianne Rice, MD   Critical care provider statement:    Critical care time (minutes):  70   Critical care start time:  09/17/2017 4:00 PM    Critical care end time:  09/17/2017 7:30 PM   Critical care time was exclusive of:  Separately billable procedures and treating other patients   Critical care was necessary to treat or prevent imminent or life-threatening deterioration of the following conditions:  Respiratory failure and circulatory failure   Critical care was time spent personally by me on the following activities:  Blood draw for specimens, discussions with consultants, evaluation of patient's response to treatment, examination of patient, obtaining history from patient or surrogate, ventilator management, review of old charts, re-evaluation of patient's condition, pulse oximetry, ordering and review of radiographic studies, ordering and review of laboratory studies and ordering and performing treatments and interventions   (including critical care time)  Medications Ordered in ED Medications  sodium chloride 0.9 % injection (not administered)  norepinephrine (LEVOPHED) 4mg  in D5W 260mL premix infusion (20 mcg/min Intravenous New Bag/Given 09/17/17 1913)  aspirin chewable tablet 324 mg (not administered)    Or  aspirin suppository 300 mg (not administered)  heparin injection 5,000 Units (not administered)  0.9 %  sodium chloride infusion (not administered)  acetaminophen (TYLENOL) tablet 650 mg (not administered)  ipratropium-albuterol (DUONEB) 0.5-2.5 (3) MG/3ML nebulizer solution 3 mL (not administered)  methylPREDNISolone sodium succinate (SOLU-MEDROL) 125 mg/2 mL injection 60 mg (not administered)  piperacillin-tazobactam (ZOSYN) IVPB 3.375 g (not administered)  insulin aspart (novoLOG) injection 0-15 Units (not administered)  vancomycin (VANCOCIN) 1,250 mg in sodium chloride 0.9 % 250 mL IVPB (not administered)  ranitidine (ZANTAC) 150 MG/10ML syrup 150 mg (not administered)  sodium chloride 0.9 % bolus 1,000 mL (0 mLs Intravenous Stopped 09/17/17 1725)    And  sodium chloride 0.9 % bolus 1,000 mL (0 mLs Intravenous  Stopped 09/17/17 1724)    And  sodium chloride 0.9 % bolus 250 mL (0 mLs Intravenous Stopped 09/17/17 1739)  piperacillin-tazobactam (ZOSYN) IVPB 3.375 g (0 g Intravenous Stopped 09/17/17 1758)  vancomycin (VANCOCIN) IVPB 1000 mg/200 mL premix (0 mg Intravenous Stopped 09/17/17 1845)  0.9 %  sodium chloride infusion (50 mL/hr Intravenous Restarted 09/17/17 1739)  iopamidol (ISOVUE-370) 76 % injection (100 mLs  Contrast Given 09/17/17 2031)  midazolam (VERSED) injection 3 mg (3 mg Intravenous Given 09/17/17 1753)     Initial Impression / Assessment and Plan / ED Course  I have reviewed the triage vital signs and the nursing notes.  Pertinent labs & imaging results that were available during my care of the patient were reviewed by me and  considered in my medical decision making (see chart for details).    Discussed with critical care who will see patient in the emergency department.  Sepsis protocol initiated.  We will give 30 cc/kg IV fluid bolus and broad-spectrum antibiotics.  Also given abrupt dyspnea and syncope, will get CT angio chest to rule out PE.   BP 103/82   Pulse 69   Temp 100.2 F (37.9 C)   Resp 18   Ht 5\' 10"  (1.778 m)   Wt 72.6 kg (160 lb)   SpO2 100%   BMI 22.96 kg/m  Initial hypertension has improved after IV fluids though continues to be borderline low blood pressure.   Patient began dropping blood blood pressure into the 70s.  Started on levophed with improvement back to a systolic in the 86N.  Critical care at bedside. Final Clinical Impressions(s) / ED Diagnoses   Final diagnoses:  Acute respiratory failure with hypoxia and hypercapnia ([place])  Severe sepsis [facility])    ED Discharge Orders    None       Julianne Rice, MD 09/17/17 2122

## 2017-09-17 NOTE — Progress Notes (Signed)
A consult was received from an ED physician for Vancomycin and Zosyn per pharmacy dosing (for an indication other than meningitis). The patient's profile has been reviewed for ht/wt/allergies/indication/available labs. A one time order has been placed for the above antibiotics.  Further antibiotics/pharmacy consults should be ordered by admitting physician if indicated.                       Reuel Boom, PharmD, BCPS [phone] 09/17/2017, 4:45 PM

## 2017-09-17 NOTE — ED Triage Notes (Signed)
Pt arrived POV after heading to PCP. Family states that pt had been sick over the weekend with a cough and decreased appetite. Pt was brought to the ED in respiratory distress.

## 2017-09-17 NOTE — ED Notes (Signed)
ED TO INPATIENT HANDOFF REPORT  Name/Age/Gender [name] 72 y.o. male  Code Status    Code Status Orders  (From admission, onward)        Start     Ordered   09/17/17 1838  Full code  Continuous     09/17/17 1847    Code Status History    Date Active Date Inactive Code Status Order ID Comments User Context   07/14/2017 20:49 07/18/2017 18:47 DNR [id number]  Karmen Bongo, MD Inpatient      Home/SNF/Other Home  Chief Complaint resp ditress   Level of Care/Admitting Diagnosis ED Disposition    ED Disposition Condition [place]: [facility] [[id number]]  Level of Care: ICU [6]  Diagnosis: Acute respiratory failure with hypoxia [facility]) [[id number]]  Admitting Physician: Evans Lance [[id number]]  Attending Physician: Evans Lance [[id number]]  Estimated length of stay: 5 - 7 days  Certification:: I certify this patient will need inpatient services for at least 2 midnights  PT Class (Do Not Modify): Inpatient [101]  PT Acc Code (Do Not Modify): Private [1]       Medical History Past Medical History:  Diagnosis Date  . CHF (congestive heart failure) ([place])   . COPD (chronic obstructive pulmonary disease) ([facility])     Allergies No Known Allergies  IV Location/Drains/Wounds Patient Lines/Drains/Airways Status   Active Line/Drains/Airways    Name:   Placement date:   Placement time:   Site:   Days:   Peripheral IV 09/17/17 Left Forearm   09/17/17    1600    Forearm   less than 1   Peripheral IV 09/17/17 Right Forearm   09/17/17    1600    Forearm   less than 1   Peripheral IV 09/17/17 Left Antecubital   09/17/17    1650    Antecubital   less than 1   NG/OG Tube Nasogastric 16 Fr. Xray 23 cm   09/17/17    1622    -   less than 1   Urethral Catheter Courtney RN Straight-tip 14 Fr.   09/17/17    1700    Straight-tip   less than 1   Airway 7.5 mm   09/17/17    1552     less than 1          Labs/Imaging Results for  orders placed or performed during the hospital encounter of 09/17/17 (from the past 48 hour(s))  Brain natriuretic peptide     Status: Abnormal   Collection Time: 09/17/17  4:00 PM  Result Value Ref Range   B Natriuretic Peptide 403.4 (H) 0.0 - 100.0 pg/mL    Comment: Performed at [facility], [place] [address], [place], [place] [address]  I-Stat Troponin, ED (not at [facility])     Status: None   Collection Time: 09/17/17  4:02 PM  Result Value Ref Range   Troponin i, poc 0.00 0.00 - 0.08 ng/mL   Comment 3            Comment: Due to the release kinetics of cTnI, a negative result within the first hours of the onset of symptoms does not rule out myocardial infarction with certainty. If myocardial infarction is still suspected, repeat the test at appropriate intervals.   CBC with Differential     Status: Abnormal   Collection Time: 09/17/17  4:03 PM  Result Value Ref Range   WBC 12.3 (H) 4.0 -  10.5 K/uL   RBC 4.80 4.22 - 5.81 MIL/uL   Hemoglobin 14.7 13.0 - 17.0 g/dL   HCT 48.2 39.0 - 52.0 %   MCV 100.4 (H) 78.0 - 100.0 fL   MCH 30.6 26.0 - 34.0 pg   MCHC 30.5 30.0 - 36.0 g/dL   RDW 14.8 11.5 - 15.5 %   Platelets 318 150 - 400 K/uL   Neutrophils Relative % 56 %   Neutro Abs 6.9 1.7 - 7.7 K/uL   Lymphocytes Relative 22 %   Lymphs Abs 2.7 0.7 - 4.0 K/uL   Monocytes Relative 22 %   Monocytes Absolute 2.7 (H) 0.1 - 1.0 K/uL   Eosinophils Relative 0 %   Eosinophils Absolute 0.0 0.0 - 0.7 K/uL   Basophils Relative 0 %   Basophils Absolute 0.0 0.0 - 0.1 K/uL    Comment: Performed at [facility], [place] [address] [place], [place] [address]  Comprehensive metabolic panel     Status: Abnormal   Collection Time: 09/17/17  4:03 PM  Result Value Ref Range   Sodium 139 135 - 145 mmol/L   Potassium 5.1 3.5 - 5.1 mmol/L   Chloride 97 (L) 101 - 111 mmol/L   CO2 27 22 - 32 mmol/L   Glucose, Bld 198 (H) 65 - 99 mg/dL   BUN 14 6 - 20 mg/dL   Creatinine, Ser  1.22 0.61 - 1.24 mg/dL   Calcium 8.6 (L) 8.9 - 10.3 mg/dL   Total Protein 7.3 6.5 - 8.1 g/dL   Albumin 3.8 3.5 - 5.0 g/dL   AST 33 15 - 41 U/L   ALT 19 17 - 63 U/L   Alkaline Phosphatase 77 38 - 126 U/L   Total Bilirubin 1.8 (H) 0.3 - 1.2 mg/dL   GFR calc non Af Amer 58 (L) >60 mL/min   GFR calc Af Amer >60 >60 mL/min    Comment: (NOTE) The eGFR has been calculated using the CKD EPI equation. This calculation has not been validated in all clinical situations. eGFR's persistently <60 mL/min signify possible Chronic Kidney Disease.    Anion gap 15 5 - 15    Comment: Performed at [facility], [place] [address], [place], [place] [address]  CBG monitoring, ED     Status: Abnormal   Collection Time: 09/17/17  4:03 PM  Result Value Ref Range   Glucose-Capillary 191 (H) 65 - 99 mg/dL  I-Stat CG4 Lactic Acid, ED     Status: Abnormal   Collection Time: 09/17/17  4:04 PM  Result Value Ref Range   Lactic Acid, Venous 7.58 (HH) 0.5 - 1.9 mmol/L   Comment NOTIFIED PHYSICIAN   Blood gas, arterial     Status: Abnormal   Collection Time: 09/17/17  4:14 PM  Result Value Ref Range   FIO2 100.00    Delivery systems VENTILATOR    Mode PRESSURE REGULATED VOLUME CONTROL    VT 550 mL   LHR 15 resp/min   Peep/cpap 5.0 cm H20   pH, Arterial 7.141 (LL) 7.350 - 7.450    Comment: RBV DR.YELVERTON,MD BY LISA CRADDOCK,RRT,RCP ON 09/17/17 AT 1630    pCO2 arterial 82.2 (HH) 32.0 - 48.0 mmHg    Comment: RBV DR.YELVERTON,MD BY LISA CRADDOCK,RRT,RCP ON 09/17/17 AT 1630    pO2, Arterial 400 (H) 83.0 - 108.0 mmHg   Bicarbonate 26.9 20.0 - 28.0 mmol/L   Acid-base deficit 4.2 (H) 0.0 - 2.0 mmol/L   O2 Saturation 99.1 %  Patient temperature 98.6    Collection site BRACHIAL ARTERY    Drawn by [id number]    Sample type ARTERIAL DRAW    Allens test (pass/fail) PASS PASS    Comment: Performed at [facility], [place] [address], [place], [place] [address]  Blood gas, arterial      Status: Abnormal   Collection Time: 09/17/17  6:24 PM  Result Value Ref Range   FIO2 50.00    Delivery systems VENTILATOR    Mode PRESSURE REGULATED VOLUME CONTROL    VT 550 mL   Peep/cpap 5.0 cm H20   pH, Arterial 7.330 (L) 7.350 - 7.450   pCO2 arterial 55.4 (H) 32.0 - 48.0 mmHg   pO2, Arterial 97.3 83.0 - 108.0 mmHg   Bicarbonate 28.4 (H) 20.0 - 28.0 mmol/L   Acid-Base Excess 2.0 0.0 - 2.0 mmol/L   O2 Saturation 97.3 %   Patient temperature 98.6    Collection site BRACHIAL ARTERY    Drawn by [id number]    Sample type ARTERIAL DRAW    Allens test (pass/fail) PASS PASS    Comment: Performed at [facility], [place] [address] [place], [place] [address]  Urinalysis, Routine w reflex microscopic     Status: Abnormal   Collection Time: 09/17/17  6:30 PM  Result Value Ref Range   Color, Urine YELLOW YELLOW   APPearance CLEAR CLEAR   Specific Gravity, Urine 1.023 1.005 - 1.030   pH 5.0 5.0 - 8.0   Glucose, UA 50 (A) NEGATIVE mg/dL   Hgb urine dipstick NEGATIVE NEGATIVE   Bilirubin Urine NEGATIVE NEGATIVE   Ketones, ur NEGATIVE NEGATIVE mg/dL   Protein, ur 100 (A) NEGATIVE mg/dL   Nitrite NEGATIVE NEGATIVE   Leukocytes, UA NEGATIVE NEGATIVE   RBC / HPF 0-5 0 - 5 RBC/hpf   WBC, UA 0-5 0 - 5 WBC/hpf   Bacteria, UA RARE (A) NONE SEEN   Squamous Epithelial / LPF NONE SEEN NONE SEEN   Mucus PRESENT     Comment: Performed at [facility], [place] [address] [place], [place] [address]  Influenza panel by PCR (type A & B)     Status: None   Collection Time: 09/17/17  6:30 PM  Result Value Ref Range   Influenza A By PCR NEGATIVE NEGATIVE   Influenza B By PCR NEGATIVE NEGATIVE    Comment: (NOTE) The Xpert Xpress Flu assay is intended as an aid in the diagnosis of  influenza and should not be used as a sole basis for treatment.  This  assay is FDA approved for nasopharyngeal swab specimens only. Nasal  washings and aspirates are unacceptable for Xpert  Xpress Flu testing. Performed at [facility], [place] [address], [place], [place] [address]   I-Stat CG4 Lactic Acid, ED     Status: Abnormal   Collection Time: 09/17/17  6:48 PM  Result Value Ref Range   Lactic Acid, Venous 5.83 (HH) 0.5 - 1.9 mmol/L   Comment NOTIFIED PHYSICIAN    Dg Chest Portable 1 View  Result Date: 09/17/2017 CLINICAL DATA:  Intubation post code EXAM: PORTABLE CHEST 1 VIEW COMPARISON:  09/12/2017 FINDINGS: Endotracheal tube in good position. NG tip difficult to visualize but may be in the distal esophagus above the GE junction. COPD with pulmonary hyperinflation. Negative for heart failure or pneumonia IMPRESSION: Endotracheal tube in good position.  COPD.  Lungs are clear NG tube tip may be in the distal esophagus. Electronically Signed  By: Franchot Gallo M.D.   On: 09/17/2017 16:25    Pending Labs Unresulted Labs (From admission, onward)   Start     Ordered   09/18/17 0500  Blood gas, arterial  Tomorrow morning,   R     09/17/17 1847   09/18/17 0500  CBC  Tomorrow morning,   R     09/17/17 1847   09/18/17 1749  Basic metabolic panel  Tomorrow morning,   R     09/17/17 1847   09/18/17 0500  Magnesium  Tomorrow morning,   R     09/17/17 1847   09/18/17 0500  Phosphorus  Tomorrow morning,   R     09/17/17 1847   09/18/17 0500  Hepatic function panel  Tomorrow morning,   R     09/17/17 1914   09/17/17 2000  Lactic acid, plasma  STAT Now then every 3 hours,   R     09/17/17 1909   09/17/17 1839  Culture, respiratory (tracheal aspirate)  Once,   R     09/17/17 1847   09/17/17 1650  Respiratory Panel by PCR  (Respiratory virus panel)  Once,   R     09/17/17 1649   09/17/17 1615  Culture, blood (Routine X 2) w Reflex to ID Panel  BLOOD CULTURE X 2,   STAT     09/17/17 1614      Vitals/Pain Today's Vitals   09/17/17 1856 09/17/17 1900 09/17/17 1905 09/17/17 1915  BP: (!) 85/62 (!) 84/63 96/81 103/77  Pulse: 65 65 61 64  Resp: '18 18 18 18   ' Temp: 99.3 F (37.4 C) 99.3 F (37.4 C) 99.3 F (37.4 C) 99.5 F (37.5 C)  SpO2: 92% 99% 99% 100%  Weight:      Height:   '5\' 10"'  (1.778 m)     Isolation Precautions Droplet precaution  Medications Medications  iopamidol (ISOVUE-370) 76 % injection (not administered)  sodium chloride 0.9 % injection (not administered)  norepinephrine (LEVOPHED) 58m in D5W 2561mpremix infusion (20 mcg/min Intravenous New Bag/Given 09/17/17 1913)  aspirin chewable tablet 324 mg (not administered)    Or  aspirin suppository 300 mg (not administered)  heparin injection 5,000 Units (not administered)  famotidine (PEPCID) 40 MG/5ML suspension 20 mg (not administered)  0.9 %  sodium chloride infusion (not administered)  acetaminophen (TYLENOL) tablet 650 mg (not administered)  ipratropium-albuterol (DUONEB) 0.5-2.5 (3) MG/3ML nebulizer solution 3 mL (not administered)  methylPREDNISolone sodium succinate (SOLU-MEDROL) 125 mg/2 mL injection 60 mg (not administered)  vancomycin (VANCOCIN) IVPB 1000 mg/200 mL premix (not administered)  piperacillin-tazobactam (ZOSYN) IVPB 3.375 g (not administered)  insulin aspart (novoLOG) injection 0-15 Units (not administered)  sodium chloride 0.9 % bolus 1,000 mL (0 mLs Intravenous Stopped 09/17/17 1725)    And  sodium chloride 0.9 % bolus 1,000 mL (0 mLs Intravenous Stopped 09/17/17 1724)    And  sodium chloride 0.9 % bolus 250 mL (0 mLs Intravenous Stopped 09/17/17 1739)  piperacillin-tazobactam (ZOSYN) IVPB 3.375 g (0 g Intravenous Stopped 09/17/17 1758)  vancomycin (VANCOCIN) IVPB 1000 mg/200 mL premix (0 mg Intravenous Stopped 09/17/17 1845)  0.9 %  sodium chloride infusion (50 mL/hr Intravenous Restarted 09/17/17 1739)  midazolam (VERSED) injection 3 mg (3 mg Intravenous Given 09/17/17 1753)    Mobility walks with device

## 2017-09-18 ENCOUNTER — Inpatient Hospital Stay (HOSPITAL_COMMUNITY): Payer: Medicare Other

## 2017-09-18 ENCOUNTER — Other Ambulatory Visit: Payer: Self-pay

## 2017-09-18 LAB — GLUCOSE, CAPILLARY
GLUCOSE-CAPILLARY: 142 mg/dL — AB (ref 65–99)
Glucose-Capillary: 218 mg/dL — ABNORMAL HIGH (ref 65–99)
Glucose-Capillary: 189 mg/dL — ABNORMAL HIGH (ref 65–99)
Glucose-Capillary: 179 mg/dL — ABNORMAL HIGH (ref 65–99)

## 2017-09-18 LAB — BLOOD GAS, ARTERIAL
Acid-Base Excess: 3.8 mmol/L — ABNORMAL HIGH (ref 0.0–2.0)
Bicarbonate: 29.4 mmol/L — ABNORMAL HIGH (ref 20.0–28.0)
DRAWN BY: 232811
FIO2: 40
MECHVT: 550 mL
O2 Saturation: 96 %
PATIENT TEMPERATURE: 37.9
PCO2 ART: 53.4 mmHg — AB (ref 32.0–48.0)
PEEP: 5 cmH2O
PO2 ART: 85.7 mmHg (ref 83.0–108.0)
RATE: 20 resp/min
pH, Arterial: 7.365 (ref 7.350–7.450)

## 2017-09-18 LAB — HEPATIC FUNCTION PANEL
ALT: 48 U/L (ref 17–63)
AST: 53 U/L — AB (ref 15–41)
Albumin: 3 g/dL — ABNORMAL LOW (ref 3.5–5.0)
Alkaline Phosphatase: 57 U/L (ref 38–126)
BILIRUBIN DIRECT: 0.2 mg/dL (ref 0.1–0.5)
Indirect Bilirubin: 0.7 mg/dL (ref 0.3–0.9)
TOTAL PROTEIN: 6 g/dL — AB (ref 6.5–8.1)
Total Bilirubin: 0.9 mg/dL (ref 0.3–1.2)

## 2017-09-18 LAB — CBC
HEMATOCRIT: 40.8 % (ref 39.0–52.0)
HEMOGLOBIN: 12.4 g/dL — AB (ref 13.0–17.0)
MCH: 29.4 pg (ref 26.0–34.0)
MCHC: 30.4 g/dL (ref 30.0–36.0)
MCV: 96.7 fL (ref 78.0–100.0)
Platelets: 303 10*3/uL (ref 150–400)
RBC: 4.22 MIL/uL (ref 4.22–5.81)
RDW: 14.7 % (ref 11.5–15.5)
WBC: 7.5 10*3/uL (ref 4.0–10.5)

## 2017-09-18 LAB — MAGNESIUM: MAGNESIUM: 1.8 mg/dL (ref 1.7–2.4)

## 2017-09-18 LAB — BASIC METABOLIC PANEL
ANION GAP: 12 (ref 5–15)
BUN: 19 mg/dL (ref 6–20)
CHLORIDE: 99 mmol/L — AB (ref 101–111)
CO2: 26 mmol/L (ref 22–32)
Calcium: 8.1 mg/dL — ABNORMAL LOW (ref 8.9–10.3)
Creatinine, Ser: 1.48 mg/dL — ABNORMAL HIGH (ref 0.61–1.24)
GFR calc non Af Amer: 46 mL/min — ABNORMAL LOW (ref >60)
GFR, EST AFRICAN AMERICAN: 53 mL/min — AB (ref >60)
Glucose, Bld: 223 mg/dL — ABNORMAL HIGH (ref 65–99)
POTASSIUM: 3.8 mmol/L (ref 3.5–5.1)
SODIUM: 137 mmol/L (ref 135–145)

## 2017-09-18 LAB — PHOSPHORUS

## 2017-09-18 IMAGING — DX DG CHEST 1V PORT
1 series · 2 of 2 positions shown · non-contrast
Comparison: [DATE]

CLINICAL DATA: Respiratory failure

EXAM:
PORTABLE CHEST 1 VIEW

[Series 1: chest ap · 0.14mm/px · 2 of 2 slices shown]
[im 1/2]
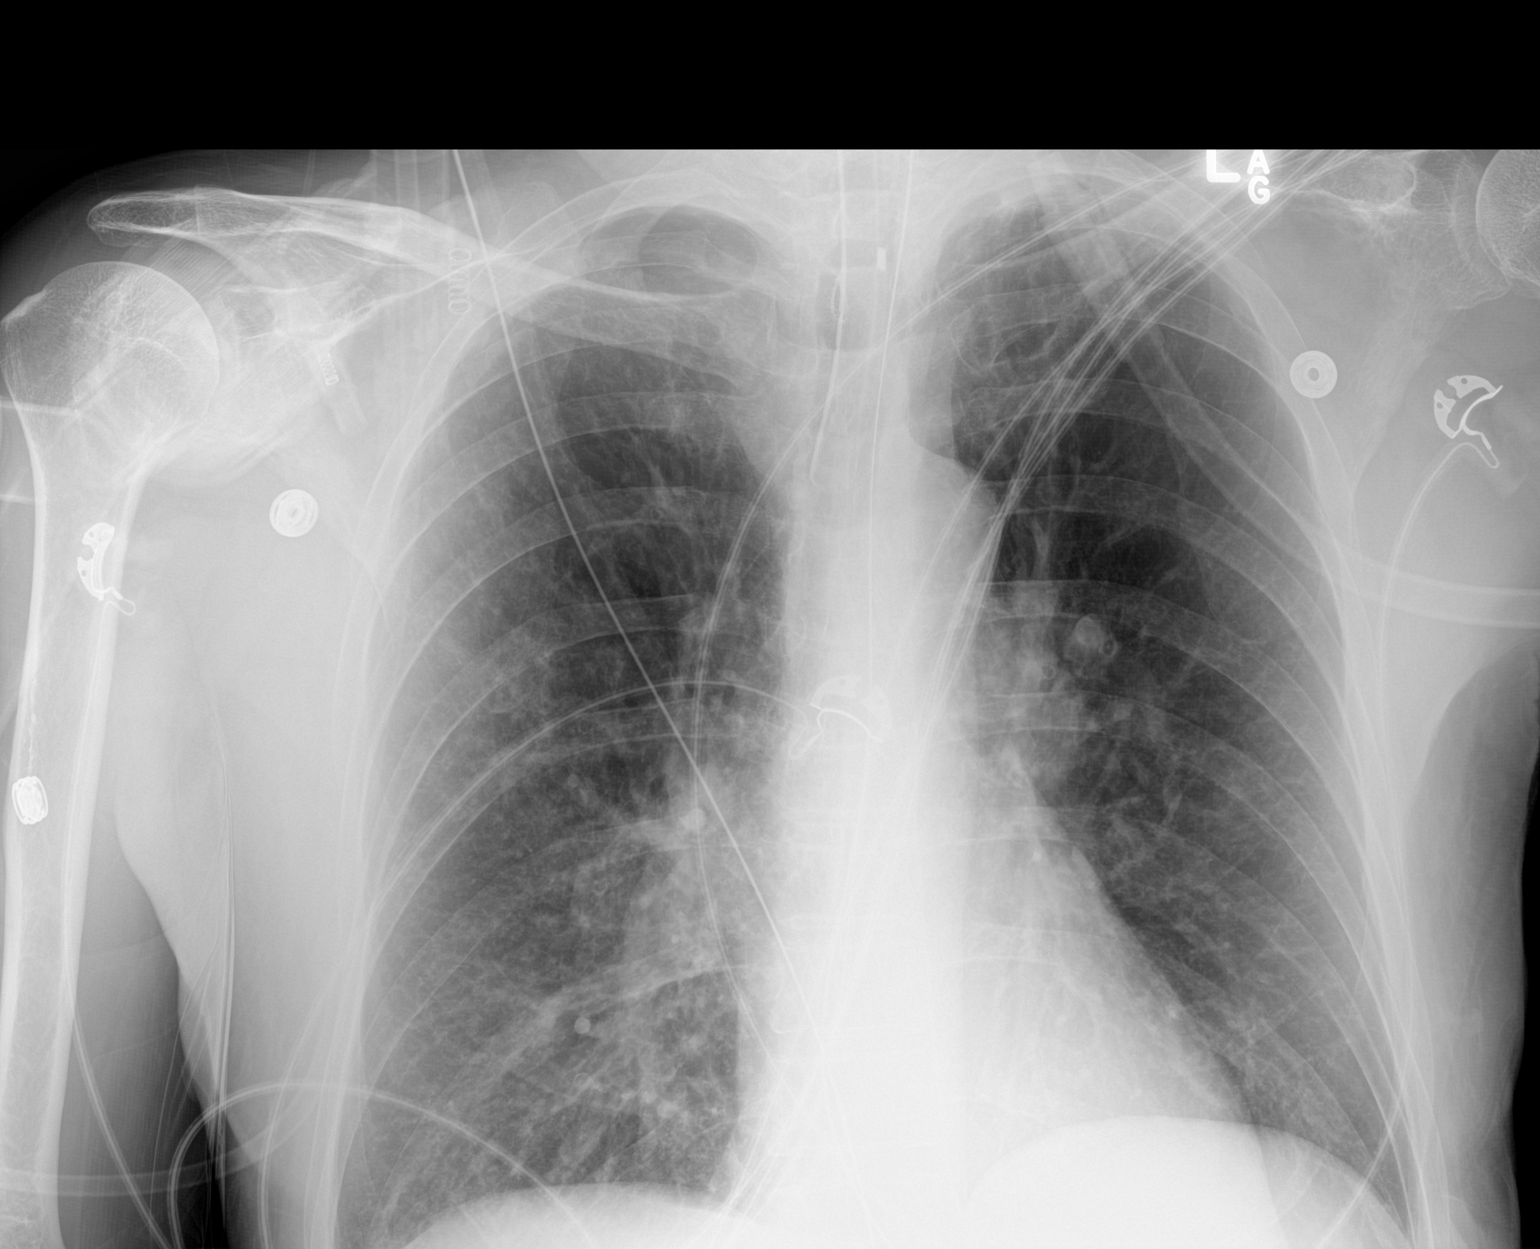
[im 2/2]
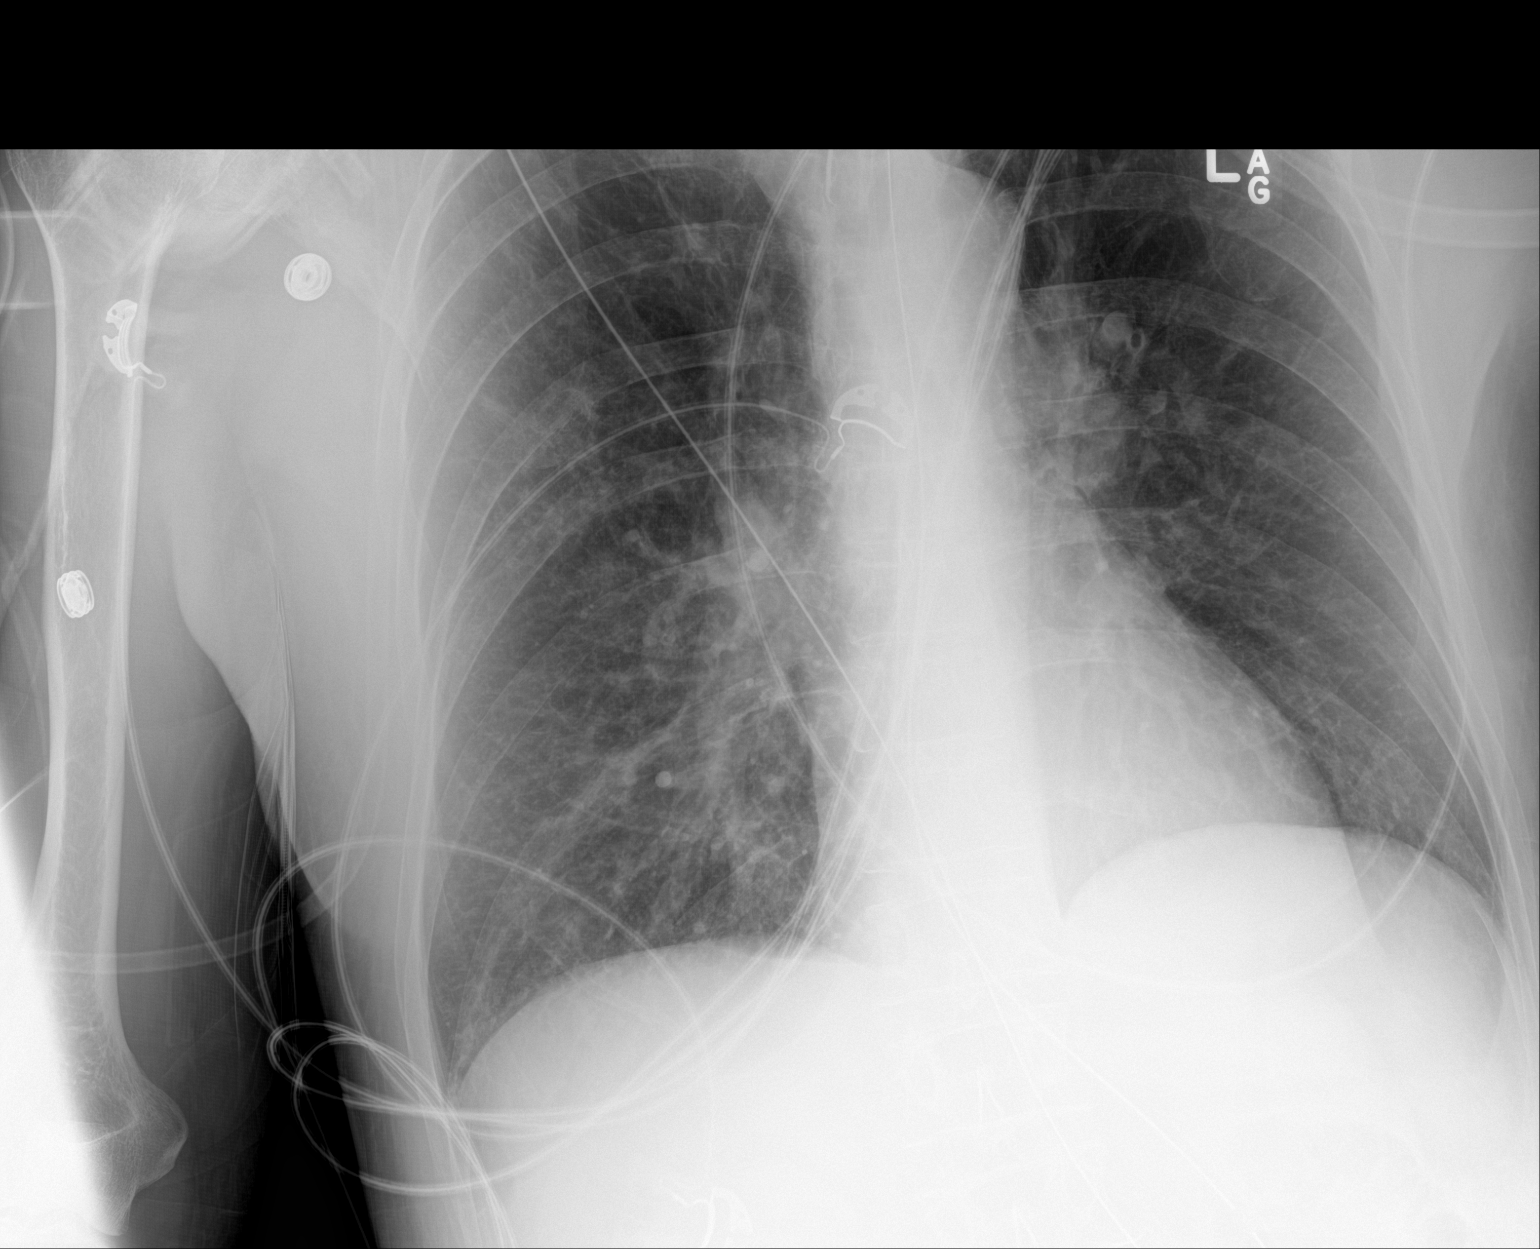

[2 of 2 positions shown; findings below may reference images not displayed]

FINDINGS: NG tube has been advanced into the stomach. Endotracheal tube is
unchanged. Hyperinflation of the lungs. Heart is normal size. No
confluent opacities or effusions.
IMPRESSION: COPD.  No acute findings.  Support devices stable.

## 2017-09-18 MED ORDER — ORAL CARE MOUTH RINSE
15.0000 mL | Freq: Four times a day (QID) | OROMUCOSAL | Status: DC
  Administered 2017-09-19: 15 mL via OROMUCOSAL

## 2017-09-18 MED ORDER — VITAL HIGH PROTEIN PO LIQD
1000.0000 mL | ORAL | Status: DC
  Filled 2017-09-18: qty 1000

## 2017-09-18 MED ORDER — POTASSIUM & SODIUM PHOSPHATES 280-160-250 MG PO PACK
1.0000 | PACK | Freq: Three times a day (TID) | ORAL | Status: AC
  Administered 2017-09-18 (×3): 1 via ORAL
  Filled 2017-09-18 (×3): qty 1

## 2017-09-18 MED ORDER — CHLORHEXIDINE GLUCONATE 0.12% ORAL RINSE (MEDLINE KIT)
15.0000 mL | Freq: Two times a day (BID) | OROMUCOSAL | Status: DC
  Administered 2017-09-18 – 2017-09-19 (×2): 15 mL via OROMUCOSAL

## 2017-09-18 MED ORDER — SODIUM CHLORIDE 0.9 % IV SOLN
20.0000 mmol | Freq: Once | INTRAVENOUS | Status: AC
  Administered 2017-09-18: 20 mmol via INTRAVENOUS
  Filled 2017-09-18: qty 20

## 2017-09-18 MED ORDER — MAGNESIUM SULFATE 2 GM/50ML IV SOLN
2.0000 g | Freq: Once | INTRAVENOUS | Status: AC
  Administered 2017-09-18: 2 g via INTRAVENOUS
  Filled 2017-09-18 (×2): qty 50

## 2017-09-18 MED ORDER — PRO-STAT SUGAR FREE PO LIQD: 30.0000 mL | Freq: Two times a day (BID) | ORAL | Status: DC

## 2017-09-18 MED FILL — Medication: Qty: 1 | Status: AC

## 2017-09-18 NOTE — Progress Notes (Signed)
Pt. transported from [facility] ED R-B to CT then to LUN-27, no complications.

## 2017-09-18 NOTE — Progress Notes (Addendum)
PULMONARY / CRITICAL CARE MEDICINE   Name: George Frazier MRN: [id number] DOB: 1945/10/29    ADMISSION DATE:  09/17/2017 CONSULTATION DATE:  09/18/17  REFERRING MD:  Dr. Lita Mains / EDP   CHIEF COMPLAINT:  SOB   BRIEF SUMMARY: 72 y/o M who presented to [facility] ER on 3/4 with reports of increasing SOB.  At baseline, the patient has O2 dependent COPD.  He was recently hospitalized for an AECOPD and discharged on antibiotics + steroids.  On presentation to ER he was diaphoretic, hypoxic and lethargic.  He required intubation in ER for acute hypercarbic respiratory failure.  In addition, he was hypotensive with a lactic acidosis.     SUBJECTIVE:  RT reports pt on PSV wean 5/5 and tolerating well but has thick secretions from ETT. Daughter at bedside.    VITAL SIGNS: BP (!) 154/85   Pulse 82   Temp 99.9 F (37.7 C)   Resp 20   Ht 5\' 10"  (1.778 m)   Wt 158 lb 8.2 oz (71.9 kg)   SpO2 95%   BMI 22.74 kg/m   HEMODYNAMICS:    VENTILATOR SETTINGS: Vent Mode: PRVC FiO2 (%):  [40 %-100 %] 40 % Set Rate:  [14 bmp-20 bmp] 20 bmp Vt Set:  [550 mL] 550 mL PEEP:  [5 cmH20] 5 cmH20 Plateau Pressure:  [18 cmH20-37 cmH20] 19 cmH20  INTAKE / OUTPUT: I/O last 3 completed shifts: In: 2582.7 [I.V.:1462.7; IV Piggyback:1120] Out: 64 [Urine:725]  PHYSICAL EXAMINATION: General:  Adult male in NAD, lying in bed  Neuro:  Awake, alert, follows commands, MAE HEENT:  ETT, pupils 3-32mm = and reactive  Cardiovascular:  s1s2 rrr, no m/r/g  Lungs:  Even/non-labored on PSV 5/5, lungs bilaterally clear  Abdomen:  Soft, non-tender  Musculoskeletal:  No acute deformities  Skin: warm/dry, trace generalized edema   LABS:  BMET Recent Labs  Lab 09/17/17 1603 09/18/17 0331  NA 139 137  K 5.1 3.8  CL 97* 99*  CO2 27 26  BUN 14 19  CREATININE 1.22 1.48*  GLUCOSE 198* 223*    Electrolytes Recent Labs  Lab 09/17/17 1603 09/18/17 0331  CALCIUM 8.6* 8.1*  MG  --  1.8  PHOS  --  <1.0*     CBC Recent Labs  Lab 09/17/17 1603 09/18/17 0331  WBC 12.3* 7.5  HGB 14.7 12.4*  HCT 48.2 40.8  PLT 318 303    Coag's No results for input(s): APTT, INR in the last 168 hours.  Sepsis Markers Recent Labs  Lab 09/17/17 1848 09/17/17 2118 09/17/17 2309  LATICACIDVEN 5.83* 4.0* 2.7*    ABG Recent Labs  Lab 09/17/17 1614 09/17/17 1824 09/18/17 0403  PHART 7.141* 7.330* 7.365  PCO2ART 82.2* 55.4* 53.4*  PO2ART 400* 97.3 85.7    Liver Enzymes Recent Labs  Lab 09/17/17 1603 09/18/17 0331  AST 33 53*  ALT 19 48  ALKPHOS 77 57  BILITOT 1.8* 0.9  ALBUMIN 3.8 3.0*    Cardiac Enzymes No results for input(s): TROPONINI, PROBNP in the last 168 hours.  Glucose Recent Labs  Lab 09/17/17 1603 09/17/17 2322 09/18/17 0312 09/18/17 0720  GLUCAP 191* 178* 218* 189*    Imaging Ct Head Wo Contrast  Result Date: 09/17/2017 CLINICAL DATA:  Cough and decreased appetite. Respiratory distress requiring intubation. EXAM: CT HEAD WITHOUT CONTRAST TECHNIQUE: Contiguous axial images were obtained from the base of the skull through the vertex without intravenous contrast. COMPARISON:  None. FINDINGS: Brain: There is no evidence of acute  intracranial hemorrhage, mass lesion, brain edema or extra-axial fluid collection. There is diffuse prominence of the ventricles and subarachnoid spaces consistent with atrophy. There is mild low-density in the periventricular white matter, likely due to chronic small vessel ischemic changes. There is no CT evidence of acute cortical infarction. Vascular: Intracranial vascular calcifications. No hyperdense vessel identified. Skull: Negative for fracture or focal lesion. Sinuses/Orbits: There is mucosal thickening throughout the maxillary and ethmoid sinuses bilaterally. No definite air-fluid levels. The mastoid air cells and middle ears are clear. No significant orbital findings. Patient is intubated. Other: None. IMPRESSION: 1. No acute intracranial  findings. 2. Atrophy and chronic small vessel ischemic changes. 3. Bilateral maxillary and ethmoid sinus mucosal thickening. Electronically Signed   By: Richardean Sale M.D.   On: 09/17/2017 21:02   Ct Angio Chest Pe W And/or Wo Contrast  Result Date: 09/17/2017 CLINICAL DATA:  Cough and decreased appetite respiratory distress EXAM: CT ANGIOGRAPHY CHEST WITH CONTRAST TECHNIQUE: Multidetector CT imaging of the chest was performed using the standard protocol during bolus administration of intravenous contrast. Multiplanar CT image reconstructions and MIPs were obtained to evaluate the vascular anatomy. CONTRAST:  148mL ISOVUE-370 IOPAMIDOL (ISOVUE-370) INJECTION 76% COMPARISON:  Radiographs 09/17/2017 FINDINGS: Cardiovascular: Satisfactory opacification of the pulmonary arteries to the segmental level. No evidence of pulmonary embolism. Nonaneurysmal aorta. No dissection is seen. Moderate aortic atherosclerosis. Mild coronary calcification. Normal heart size. No significant pericardial effusion. Mediastinum/Nodes: Endotracheal tube tip about 3.5 cm superior to the carina. No thyroid mass. Mild mediastinal lymph nodes, for example right paratracheal lymph node measures 9 mm. AP window lymph nodes measuring 9 mm. Precarinal lymph node measures 14 mm. Small right hilar lymph nodes measuring up to 11 mm. Esophageal tube tip is in the mid stomach. Lungs/Pleura: Severe emphysema. Biapical scarring. No pleural effusion, pneumothorax or consolidative process. Mild peribronchial thickening. Subtle tree-in-bud densities within the right upper, right middle and right lower lobes. Upper Abdomen: Partially visualized cyst or hydronephrosis in the upper pole of the right kidney Musculoskeletal: Degenerative changes. No acute or suspicious lesion. Review of the MIP images confirms the above findings. IMPRESSION: 1. Negative for acute pulmonary embolus. 2. Severe emphysema. No focal consolidation or focal airspace disease. Few  subtle foci of mild tree-in-bud density, mostly within the right lung suggestive of bronchiolitis. Mild peribronchial thickening, could be due to central airways inflammation. 3. Mild mediastinal lymph nodes, likely reactive 4. Partially visualized cyst versus hydronephrosis in the upper pole of the right kidney Aortic Atherosclerosis (ICD10-I70.0) and Emphysema (ICD10-J43.9). Electronically Signed   By: Donavan Foil M.D.   On: 09/17/2017 21:09   Dg Chest Port 1 View  Result Date: 09/18/2017 CLINICAL DATA:  Respiratory failure EXAM: PORTABLE CHEST 1 VIEW COMPARISON:  09/17/2017 FINDINGS: NG tube has been advanced into the stomach. Endotracheal tube is unchanged. Hyperinflation of the lungs. Heart is normal size. No confluent opacities or effusions. IMPRESSION: COPD.  No acute findings.  Support devices stable. Electronically Signed   By: Rolm Baptise M.D.   On: 09/18/2017 07:19   Dg Chest Portable 1 View  Result Date: 09/17/2017 CLINICAL DATA:  Intubation post code EXAM: PORTABLE CHEST 1 VIEW COMPARISON:  09/12/2017 FINDINGS: Endotracheal tube in good position. NG tip difficult to visualize but may be in the distal esophagus above the GE junction. COPD with pulmonary hyperinflation. Negative for heart failure or pneumonia IMPRESSION: Endotracheal tube in good position.  COPD.  Lungs are clear NG tube tip may be in the distal esophagus. Electronically Signed  By: Franchot Gallo M.D.   On: 09/17/2017 16:25    STUDIES:  CT Head 3/4 >> no acute findings, atrophy & small vessel ischemic changes, bilateral maxillary & ethmoid sinus mucosal thickening  CTA Chest 3/4 >> negative for PE, severe emphysema, no focal consolidation or airspace disease, few subtle tree-in-bud density mostly in the R lung, mild peribronchial thickening, mild mediastinal lymph nodes, partially visualized cyst vs hydronephrosis in the upper pole of the right kidney   CULTURES: UC 3/4 >> negative  RVP 3/4 >> positive for RSV  BCx2  2/4 >>   ANTIBIOTICS: Vanco 3/4 >> 3/5  Zosyn 3/4 >> 3/5  Doxycycline 3/4 >>   SIGNIFICANT EVENTS: 3/04  Admit with hypercarbic resp fx  LINES/TUBES: ETT 3/4 >>   DISCUSSION: 72 y/o M with O2 dependent COPD admitted on 3/4 with acute hypercapnic respiratory failure in the setting of RSV.  CXR without acute infiltrate.    ASSESSMENT / PLAN:  PULMONARY A: RSV Positive  Acute Hypercapnic Respiratory Failure - secondary to RSV 2L O2 Dependent COPD  P:   PRVC 8 cc/kg with wean for extubation 3/5  Wean PEEP / FiO2 for sats 88-94% May need BiPAP post extubation  Intermittent CXR  Solumedrol 60 mg IV Q8 Duoneb Q4  Daily SBT / WUA  Will need Pulmonary follow up at discharge > arranged, will need PFT's Previously discharged on Symbicort but ran out after hospitalization > did not have Rx  CARDIOVASCULAR A:  Severe Sepsis  Prolonged QTc Hypotension  Hx CHF  P:  ICU monitoring  Wean Levo for MAP > 65  NS @ 50 ml/hr  RENAL A:   Hypophosphatemia  Cyst vs Hydronephrosis - in R kidney on CTA chest P:   Trend BMP / urinary output Replace electrolytes as indicated Avoid nephrotoxic agents, ensure adequate renal perfusion Assess renal US to follow up above, cyst vs hydronephrosis   GASTROINTESTINAL A:   NPO  P:   NPO x 4 hours then advance diet as toleraetd Zantac for SUP   HEMATOLOGIC A:  DVT prophylaxis  P:  Heparin SQ for DVT prophlaxis Trend CBC    INFECTIOUS A:   RSV Positive  P:   Discontinue vanco/zosyn  Continue Doxycycline  Follow fever curve / WBC trend   ENDOCRINE A:   Hyperglycemia    P:   SSI   NEUROLOGIC A:   Ventilator Associated Pain / Anxiety  P:   RASS goal: 0  Discontinue sedation protocol  PT efforts post extubation    FAMILY  - Updates: Family updated at bedside am 3/5 on plan of care.    - Inter-disciplinary family meet or Palliative Care meeting due by:  3/10   Noe Gens, NP-C [place] Pgr: [phone] or if no answer [phone] 09/18/2017, 9:42 AM

## 2017-09-18 NOTE — Progress Notes (Signed)
CRITICAL VALUE ALERT  Critical Value:  Phos <1  Date & Time Notied:  09/18/17  0430  Provider Notified: CCM  Orders Received/Actions taken: replacement

## 2017-09-18 NOTE — Progress Notes (Signed)
Nutrition Brief Note  Patient screened for new vent.   Wt Readings from Last 15 Encounters:  09/18/17 158 lb 8.2 oz (71.9 kg)  07/14/17 156 lb 8.4 oz (71 kg)    Body mass index is 22.74 kg/m. Patient meets criteria for normal weight based on current BMI. Skin WDL. Pt was intubated in the ED. He was extubated to [place] about 15 minutes ago. He did not screen for any other nutrition-related screening protocols.   Current diet order is NPO. IVF: NS @ 50 mL/hr.   Medications reviewed; sliding scale Novolog, 2 g IV Mg sulfate x1 run today, 60 mg Solu-medrol TID, 1 packet Phos-Nak TID, 20 mmol GlycoPhos x1 today. Labs reviewed; Cl: 99 mmol/L, creatinine: 1.48 mg/dL, Ca: 8.1 mg/dL, Phos: <1 mg/dL, GFR: 46 mL/min.  No nutrition interventions warranted at this time. If nutrition issues arise, please consult RD.     Jarome Matin, MS, RD, LDN, [facility] Inpatient Clinical Dietitian Pager # [phone] After hours/weekend pager # [phone]

## 2017-09-18 NOTE — Procedures (Signed)
Extubation Procedure Note  Patient Details:   Name: George Frazier DOB: 18-May-1946 MRN: [id number]   Airway Documentation:     Evaluation  O2 sats: 92 Complications: none Patient tolerated procedure well. Bilateral Breath Sounds: Diminished,   Pt able to speak  Per verbal oder from CCM, pt extubated.   Placed on 3L nasal cannula.   Martha Clan 09/18/2017, 10:46 AM

## 2017-09-18 NOTE — Progress Notes (Signed)
[facility] Progress Note Patient Name: George Frazier DOB: 1946/04/28 MRN: [id number]   Date of Service  09/18/2017  HPI/Events of Note  HypoK and hypoMg  eICU Interventions  Electrolytes replacement ordered        Arda Daggs 09/18/2017, 5:11 AM

## 2017-09-19 ENCOUNTER — Inpatient Hospital Stay (HOSPITAL_COMMUNITY): Payer: Medicare Other

## 2017-09-19 LAB — BASIC METABOLIC PANEL
Anion gap: 6 (ref 5–15)
BUN: 27 mg/dL — AB (ref 6–20)
CALCIUM: 8.1 mg/dL — AB (ref 8.9–10.3)
CO2: 32 mmol/L (ref 22–32)
CREATININE: 1.46 mg/dL — AB (ref 0.61–1.24)
Chloride: 105 mmol/L (ref 101–111)
GFR, EST AFRICAN AMERICAN: 54 mL/min — AB (ref >60)
GFR, EST NON AFRICAN AMERICAN: 47 mL/min — AB (ref >60)
Glucose, Bld: 151 mg/dL — ABNORMAL HIGH (ref 65–99)
Potassium: 3.7 mmol/L (ref 3.5–5.1)
SODIUM: 143 mmol/L (ref 135–145)

## 2017-09-19 LAB — PHOSPHORUS: PHOSPHORUS: 3 mg/dL (ref 2.5–4.6)

## 2017-09-19 LAB — MRSA PCR SCREENING: MRSA by PCR: NEGATIVE

## 2017-09-19 LAB — CBC
HCT: 37.5 % — ABNORMAL LOW (ref 39.0–52.0)
HEMOGLOBIN: 11.2 g/dL — AB (ref 13.0–17.0)
MCH: 29.3 pg (ref 26.0–34.0)
MCHC: 29.9 g/dL — AB (ref 30.0–36.0)
MCV: 98.2 fL (ref 78.0–100.0)
PLATELETS: 282 10*3/uL (ref 150–400)
RBC: 3.82 MIL/uL — ABNORMAL LOW (ref 4.22–5.81)
RDW: 15 % (ref 11.5–15.5)
WBC: 8 10*3/uL (ref 4.0–10.5)

## 2017-09-19 LAB — GLUCOSE, CAPILLARY
GLUCOSE-CAPILLARY: 208 mg/dL — AB (ref 65–99)
Glucose-Capillary: 148 mg/dL — ABNORMAL HIGH (ref 65–99)
Glucose-Capillary: 138 mg/dL — ABNORMAL HIGH (ref 65–99)
Glucose-Capillary: 162 mg/dL — ABNORMAL HIGH (ref 65–99)
Glucose-Capillary: 178 mg/dL — ABNORMAL HIGH (ref 65–99)

## 2017-09-19 LAB — MAGNESIUM: MAGNESIUM: 2.4 mg/dL (ref 1.7–2.4)

## 2017-09-19 LAB — LEGIONELLA PNEUMOPHILA SEROGP 1 UR AG: L. pneumophila Serogp 1 Ur Ag: NEGATIVE

## 2017-09-19 MED ORDER — METHYLPREDNISOLONE SODIUM SUCC 125 MG IJ SOLR
60.0000 mg | Freq: Two times a day (BID) | INTRAMUSCULAR | Status: DC
  Administered 2017-09-19 – 2017-09-21 (×4): 60 mg via INTRAVENOUS
  Filled 2017-09-19 (×4): qty 2

## 2017-09-19 MED ORDER — IPRATROPIUM-ALBUTEROL 0.5-2.5 (3) MG/3ML IN SOLN
3.0000 mL | RESPIRATORY_TRACT | Status: DC
  Administered 2017-09-20 (×5): 3 mL via RESPIRATORY_TRACT
  Filled 2017-09-19 (×5): qty 3

## 2017-09-19 MED ORDER — IPRATROPIUM-ALBUTEROL 0.5-2.5 (3) MG/3ML IN SOLN: 3.0000 mL | Freq: Four times a day (QID) | RESPIRATORY_TRACT | Status: DC | PRN

## 2017-09-19 NOTE — Progress Notes (Addendum)
PULMONARY / CRITICAL CARE MEDICINE   Name: George Frazier MRN: [id number] DOB: 1946/06/19    ADMISSION DATE:  09/17/2017 CONSULTATION DATE:  09/18/17  REFERRING MD:  Dr. Lita Mains / EDP   CHIEF COMPLAINT:  SOB   BRIEF SUMMARY: 72 y/o M who presented to [facility] ER on 3/4 with reports of increasing SOB.  At baseline, the patient has O2 dependent COPD.  He was recently hospitalized for an AECOPD and discharged on antibiotics + steroids.  On presentation to ER he was diaphoretic, hypoxic and lethargic.  He required intubation in ER for acute hypercarbic respiratory failure.  In addition, he was hypotensive with a lactic acidosis.     SUBJECTIVE:  Pt tolerated extubation.  Tmax 97.8.  On 3L per [place]  VITAL SIGNS: BP 114/63   Pulse 80   Temp 97.8 F (36.6 C) (Oral)   Resp 18   Ht 5\' 10"  (1.778 m)   Wt 164 lb 0.4 oz (74.4 kg)   SpO2 99%   BMI 23.53 kg/m   HEMODYNAMICS:    VENTILATOR SETTINGS:    INTAKE / OUTPUT: I/O last 3 completed shifts: In: 5166.7 [I.V.:3296.7; Other:750; IV Piggyback:1120] Out: 1590 [Urine:1590]  PHYSICAL EXAMINATION: General: elderly male in NAD, sitting up in bed HEENT: MM pink/moist, thick beard PSY: calm/appropriate  Neuro: AAOx4, speech clear, MAE  CV: s1s2 rrr, no m/r/g PULM: even/non-labored, lungs bilaterally with diffuse wheezing  PI:RJJO, non-tender, bsx4 active  Extremities: warm/dry, no edema  Skin: no rashes or lesions  LABS:  BMET Recent Labs  Lab 09/17/17 1603 09/18/17 0331 09/19/17 0312  NA 139 137 143  K 5.1 3.8 3.7  CL 97* 99* 105  CO2 27 26 32  BUN 14 19 27*  CREATININE 1.22 1.48* 1.46*  GLUCOSE 198* 223* 151*    Electrolytes Recent Labs  Lab 09/17/17 1603 09/18/17 0331 09/19/17 0312  CALCIUM 8.6* 8.1* 8.1*  MG  --  1.8 2.4  PHOS  --  <1.0* 3.0    CBC Recent Labs  Lab 09/17/17 1603 09/18/17 0331 09/19/17 0312  WBC 12.3* 7.5 8.0  HGB 14.7 12.4* 11.2*  HCT 48.2 40.8 37.5*  PLT 318 303 282    Coag's No  results for input(s): APTT, INR in the last 168 hours.  Sepsis Markers Recent Labs  Lab 09/17/17 1848 09/17/17 2118 09/17/17 2309  LATICACIDVEN 5.83* 4.0* 2.7*    ABG Recent Labs  Lab 09/17/17 1614 09/17/17 1824 09/18/17 0403  PHART 7.141* 7.330* 7.365  PCO2ART 82.2* 55.4* 53.4*  PO2ART 400* 97.3 85.7    Liver Enzymes Recent Labs  Lab 09/17/17 1603 09/18/17 0331  AST 33 53*  ALT 19 48  ALKPHOS 77 57  BILITOT 1.8* 0.9  ALBUMIN 3.8 3.0*    Cardiac Enzymes No results for input(s): TROPONINI, PROBNP in the last 168 hours.  Glucose Recent Labs  Lab 09/17/17 2322 09/18/17 0312 09/18/17 0720 09/18/17 1137 09/18/17 1655 09/18/17 2138  GLUCAP 178* 218* 189* 142* 179* 148*    Imaging US Renal  Result Date: 09/18/2017 CLINICAL DATA:  72 year old male. Right hydronephrosis versus cyst noted on recent chest CT. Subsequent encounter. EXAM: RENAL ULTRASOUND COMPARISON:  09/17/2017 chest CT. FINDINGS: Right Kidney: Length: 10.6 cm. Echogenicity within normal limits. 3.6 x 3 x 4.3 cm central/parapelvic cyst. No hydronephrosis. Left Kidney: Length: 10.5 cm. Echogenicity within normal limits. No mass or hydronephrosis visualized. Bladder: Bladder not visualized and may be decompressed. IMPRESSION: 3.6 x 3 x 4.3 cm cyst central aspect right  kidney. No hydronephrosis. Electronically Signed   By: Genia Del M.D.   On: 09/18/2017 18:09   Dg Chest Port 1 View  Result Date: 09/19/2017 CLINICAL DATA:  Acute respiratory failure with hypoxia EXAM: PORTABLE CHEST 1 VIEW COMPARISON:  Yesterday FINDINGS: Extubation of the trachea and esophagus. COPD with hyperinflation and interstitial coarsening. No new or acute airspace opacity. No effusion or pneumothorax. Normal heart size. IMPRESSION: 1. Stable chest after extubation. 2. COPD. Electronically Signed   By: Monte Fantasia M.D.   On: 09/19/2017 07:25    STUDIES:  CT Head 3/4 >> no acute findings, atrophy & small vessel ischemic  changes, bilateral maxillary & ethmoid sinus mucosal thickening  CTA Chest 3/4 >> negative for PE, severe emphysema, no focal consolidation or airspace disease, few subtle tree-in-bud density mostly in the R lung, mild peribronchial thickening, mild mediastinal lymph nodes, partially visualized cyst vs hydronephrosis in the upper pole of the right kidney  Renal US 3/5 >> 3.6x4.3 cm cyst central aspect R kidney, no hydronephrosis   CULTURES: UC 3/4 >> negative  RVP 3/4 >> positive for RSV  BCx2 2/4 >>   ANTIBIOTICS: Vanco 3/4 >> 3/5  Zosyn 3/4 >> 3/5  Doxycycline 3/4 >>   SIGNIFICANT EVENTS: 3/04  Admit with hypercarbic resp fx 3/05  Extubated  3/06  Tx to med-surg   LINES/TUBES: ETT 3/4 >> 3/5  DISCUSSION: 72 y/o M with O2 dependent COPD admitted on 3/4 with acute hypercapnic respiratory failure in the setting of RSV.  CXR without acute infiltrate.    ASSESSMENT / PLAN:  PULMONARY A: RSV Positive  Acute Hypercapnic Respiratory Failure - secondary to RSV 2L O2 Dependent COPD  P:   Wean O2 to baseline Reassess ambulatory O2 needs prior to discharge  Intermittent CXR  Reduce solumedrol to 60 mg IV BID  Duoneb Q4  Pt will need Rx for Symbicort at discharge > he was not given one at last discharge  Pulmonary follow up arranged for discharge, will need PFT's  Anticipate slow resolution of wheezing / respiratory status given RSV  CARDIOVASCULAR A:  Severe Sepsis  Prolonged QTc Hypotension  Hx CHF  P:  KVO IVF  Transfer to med surg Monitor QTc   RENAL A:   Hypophosphatemia  R Renal Cyst  P:   Renal US as above Trend BMP / urinary output Replace electrolytes as indicated Avoid nephrotoxic agents, ensure adequate renal perfusion    HEMATOLOGIC A:  DVT prophylaxis  P:  Trend CBC  Heparin SQ for DVT prophylaxis     INFECTIOUS A:   RSV Positive  P:   Continue Doxycycline, consider 5 days for AECOPD  Follow fever curve / WBC trend   ENDOCRINE A:    Hyperglycemia    P:   SSI  NEUROLOGIC A:   Ventilator Associated Pain / Anxiety - resolved  P:   PT evaluation pending   FAMILY  - Updates: Patient updated on plan of care 3/6     - Global:  Transfer to Tele given prior prolonged QTc  Noe Gens, NP-C [facility] Pgr: [phone] or if no answer [phone] 09/19/2017, 9:26 AM

## 2017-09-19 NOTE — Evaluation (Signed)
Physical Therapy Evaluation Patient Details Name: George Frazier MRN: [id number] DOB: 1945-11-11 Today's Date: 09/19/2017   History of Present Illness  72 y/o M with O2 dependent COPD admitted on 09/17/17 with acute hypercapnic respiratory failure in the setting of RSV.  ETT 3/4-09/18/17  Clinical Impression  Pt admitted with above diagnosis. Pt currently with functional limitations due to the deficits listed below (see PT Problem List).  Pt will benefit from skilled PT to increase their independence and safety with mobility to allow discharge to the venue listed below.  Pt assisted with ambulating in hallway and required increased supplemental oxygen.  Pt reports he was just weaned from oxygen a few weeks before admission.  Pt reports he plans to d/c to his neighbor's home.     Follow Up Recommendations Home health PT;Supervision for mobility/OOB    Equipment Recommendations  Rolling walker with 5" wheels(may progress to no needs)    Recommendations for Other Services       Precautions / Restrictions Precautions Precautions: Fall Precaution Comments: monitor sats Restrictions Weight Bearing Restrictions: No      Mobility  Bed Mobility Overal bed mobility: Needs Assistance Bed Mobility: Supine to Sit     Supine to sit: Min guard        Transfers Overall transfer level: Needs assistance Equipment used: Rolling walker (2 wheeled) Transfers: Sit to/from Stand Sit to Stand: Min guard         General transfer comment: min/guard for safety and lines  Ambulation/Gait Ambulation/Gait assistance: Min guard Ambulation Distance (Feet): 80 Feet Assistive device: Rolling walker (2 wheeled) Gait Pattern/deviations: Step-through pattern;Decreased stride length;Trunk flexed     General Gait Details: verbal cues for RW positioning and standing rest breaks for breathing, SpO2 dropped to 79% on 3L O2, increased to 4L O2 and SpO2 85% so therapist limited distance  Stairs             Wheelchair Mobility    Modified Rankin (Stroke Patients Only)       Balance                                             Pertinent Vitals/Pain Pain Assessment: No/denies pain    Home Living Family/patient expects to be discharged to:: Private residence Living Arrangements: Alone Available Help at Discharge: Neighbor Type of Home: House Home Access: Level entry     Home Layout: Able to live on main level with bedroom/bathroom Home Equipment: None Additional Comments: pt reports he will d/c to neighbor's home, no steps    Prior Function Level of Independence: Independent               Hand Dominance        Extremity/Trunk Assessment        Lower Extremity Assessment Lower Extremity Assessment: Generalized weakness       Communication   Communication: No difficulties  Cognition Arousal/Alertness: Awake/alert Behavior During Therapy: WFL for tasks assessed/performed Overall Cognitive Status: Within Functional Limits for tasks assessed                                        General Comments      Exercises     Assessment/Plan    PT Assessment Patient needs continued PT services  PT Problem List Decreased strength;Decreased mobility;Decreased activity tolerance;Decreased knowledge of use of DME;Cardiopulmonary status limiting activity       PT Treatment Interventions Gait training;Patient/family education;Therapeutic exercise;Therapeutic activities;DME instruction;Balance training;Functional mobility training    PT Goals (Current goals can be found in the Care Plan section)  Acute Rehab PT Goals PT Goal Formulation: With patient Time For Goal Achievement: 10/03/17 Potential to Achieve Goals: Good    Frequency Min 3X/week   Barriers to discharge        Co-evaluation               AM-PAC PT "6 Clicks" Daily Activity  Outcome Measure Difficulty turning over in bed (including adjusting  bedclothes, sheets and blankets)?: A Little Difficulty moving from lying on back to sitting on the side of the bed? : A Little Difficulty sitting down on and standing up from a chair with arms (e.g., wheelchair, bedside commode, etc,.)?: A Little Help needed moving to and from a bed to chair (including a wheelchair)?: A Little Help needed walking in hospital room?: A Little Help needed climbing 3-5 steps with a railing? : A Little 6 Click Score: 18    End of Session Equipment Utilized During Treatment: Gait belt Activity Tolerance: Patient tolerated treatment well Patient left: in chair;with call bell/phone within reach(on alarm pad however no box in room)   PT Visit Diagnosis: Other abnormalities of gait and mobility (R26.89)    Time: 3810-1751 PT Time Calculation (min) (ACUTE ONLY): 20 min   Charges:   PT Evaluation $PT Eval Moderate Complexity: 1 Mod     PT G CodesCarmelia Bake, PT, DPT 09/19/2017 Pager: [phone]  York Ram E 09/19/2017, 1:51 PM

## 2017-09-20 ENCOUNTER — Encounter (HOSPITAL_COMMUNITY): Payer: Self-pay | Admitting: Family Medicine

## 2017-09-20 DIAGNOSIS — Q6211 Congenital occlusion of ureteropelvic junction: Secondary | ICD-10-CM

## 2017-09-20 LAB — MYCOPLASMA PNEUMONIAE ANTIBODY, IGM

## 2017-09-20 LAB — BASIC METABOLIC PANEL
Anion gap: 8 (ref 5–15)
BUN: 31 mg/dL — AB (ref 6–20)
CALCIUM: 8.3 mg/dL — AB (ref 8.9–10.3)
CO2: 31 mmol/L (ref 22–32)
CREATININE: 1.24 mg/dL (ref 0.61–1.24)
Chloride: 107 mmol/L (ref 101–111)
GFR calc Af Amer: >60 mL/min (ref >60)
GFR, EST NON AFRICAN AMERICAN: 57 mL/min — AB (ref >60)
GLUCOSE: 144 mg/dL — AB (ref 65–99)
Potassium: 4.1 mmol/L (ref 3.5–5.1)
Sodium: 146 mmol/L — ABNORMAL HIGH (ref 135–145)

## 2017-09-20 LAB — CBC
HCT: 37 % — ABNORMAL LOW (ref 39.0–52.0)
HEMOGLOBIN: 11.1 g/dL — AB (ref 13.0–17.0)
MCH: 29.4 pg (ref 26.0–34.0)
MCHC: 30 g/dL (ref 30.0–36.0)
MCV: 97.9 fL (ref 78.0–100.0)
Platelets: 324 10*3/uL (ref 150–400)
RBC: 3.78 MIL/uL — ABNORMAL LOW (ref 4.22–5.81)
RDW: 15.1 % (ref 11.5–15.5)
WBC: 7.7 10*3/uL (ref 4.0–10.5)

## 2017-09-20 LAB — GLUCOSE, CAPILLARY
GLUCOSE-CAPILLARY: 140 mg/dL — AB (ref 65–99)
GLUCOSE-CAPILLARY: 103 mg/dL — AB (ref 65–99)
GLUCOSE-CAPILLARY: 173 mg/dL — AB (ref 65–99)
Glucose-Capillary: 235 mg/dL — ABNORMAL HIGH (ref 65–99)

## 2017-09-20 LAB — PHOSPHORUS: Phosphorus: 2.8 mg/dL (ref 2.5–4.6)

## 2017-09-20 MED ORDER — IPRATROPIUM-ALBUTEROL 0.5-2.5 (3) MG/3ML IN SOLN
3.0000 mL | Freq: Four times a day (QID) | RESPIRATORY_TRACT | Status: DC
  Administered 2017-09-21: 3 mL via RESPIRATORY_TRACT
  Filled 2017-09-20 (×2): qty 3

## 2017-09-20 MED ORDER — HYDRALAZINE HCL 20 MG/ML IJ SOLN
5.0000 mg | Freq: Once | INTRAMUSCULAR | Status: AC
  Administered 2017-09-20: 5 mg via INTRAVENOUS
  Filled 2017-09-20: qty 1

## 2017-09-20 MED ORDER — GUAIFENESIN-DM 100-10 MG/5ML PO SYRP: 10.0000 mL | ORAL_SOLUTION | ORAL | Status: DC | PRN

## 2017-09-20 NOTE — Progress Notes (Signed)
PROGRESS NOTE    Patient: George Frazier     PCP: Leonard Downing, MD                    DOB: 09-04-1945            DOA: 09/17/2017 TCY:[id number]             DOS: 09/20/2017, 4:08 PM   LOS: 3 days   Date of Service: The patient was seen and examined on 09/20/2017  Subjective:  She was seen and examined this morning, stable, on O2 via nasal cannula, satting greater than 92%, awake alert oriented no acute distress.  ----------------------------------------------------------------------------------------------------------------------  Brief Narrative:  72 year old male with extensive history of emphysema/COPD, O2 dependent, on 09/17/2017 was hospitalized for acute respiratory failure, hypoxia, diaphoretic, lethargic was intubated in ED, then admitted to ICU. Status post extubation on 09/19/2017 stabilized transferred out of ICU.  Note patient has been recently hospitalized for acute COPD exacerbation was treated with antibiotics and steroids.    Active Problems:   Acute respiratory failure with hypoxia [facility])   Assessment & Plan:   Acute on chronic respiratory failure/COPD exacerbation/RSV viral infection -Status post admission on 09/17/2017 hospitalization intubation transferred to ICU, status post extubation on 09/19/2017 stabilized transferred out of ICU -Stable on O2 via nasal cannula, satting greater than 92% -CTA on 09/18/2018 19- for PE, 3 but appearance cystic appearance in the right kidney -Continue IV Solu-Medrol, DuoNeb bronchodilators PRN, -Continue antibodies of doxycycline -Considering adding Symbicort -Influenza screen negative, HIV nonreactive, -Blood cultures negative to date  Lactic acidosis secondary to respiratory failure -Resolved  Hypotension on admission -Resolved, continue to monitor  Questionable history of systolic versus diastolic heart failure -Echocardiogram from 07/15/2017 was reviewed, normal ejection fraction of 55-60%, negative for any LV  dysfunction -Negative for any edema -Home medications were reviewed not on any heart failure medications. -Continue to monitor    DVT prophylaxis:   SCDs/compression stockings     Code Status:         Full code    Family Communication:  The above findings and plan of care has been discussed with patient and family in detail, they expressed understanding and agreement of above. Disposition Plan:  1-2 days,             Home           Consultants:   Pulmonary critical care  Procedures:   Intubation/extubated in ICU   Antimicrobials:  Anti-infectives (From admission, onward)   Start     Dose/Rate Route Frequency Ordered Stop   09/18/17 1000  vancomycin (VANCOCIN) 1,250 mg in sodium chloride 0.9 % 250 mL IVPB  Status:  Discontinued     1,250 mg 166.7 mL/hr over 90 Minutes Intravenous Every 24 hours 09/17/17 1952 09/18/17 1000   09/18/17 0200  piperacillin-tazobactam (ZOSYN) IVPB 3.375 g  Status:  Discontinued     3.375 g 12.5 mL/hr over 240 Minutes Intravenous Every 8 hours 09/17/17 1910 09/18/17 1000   09/17/17 2200  doxycycline (VIBRAMYCIN) 100 mg in sodium chloride 0.9 % 250 mL IVPB     100 mg 125 mL/hr over 120 Minutes Intravenous Every 12 hours 09/17/17 2128     09/17/17 1900  piperacillin-tazobactam (ZOSYN) IVPB 3.375 g  Status:  Discontinued     3.375 g 12.5 mL/hr over 240 Minutes Intravenous Every 6 hours 09/17/17 1847 09/17/17 1909   09/17/17 1900  vancomycin (VANCOCIN) IVPB 1000 mg/200 mL premix  Status:  Discontinued     1,000 mg 200 mL/hr over 60 Minutes Intravenous  Once 09/17/17 1852 09/17/17 2113   09/17/17 1630  piperacillin-tazobactam (ZOSYN) IVPB 3.375 g     3.375 g 100 mL/hr over 30 Minutes Intravenous  Once 09/17/17 1628 09/17/17 1758   09/17/17 1630  vancomycin (VANCOCIN) IVPB 1000 mg/200 mL premix     1,000 mg 200 mL/hr over 60 Minutes Intravenous  Once 09/17/17 1628 09/17/17 1845        Objective: Vitals:   09/20/17 0724 09/20/17 1123 09/20/17  1431 09/20/17 1528  BP:   (!) 163/87   Pulse:   87   Resp:   18   Temp:   98.6 F (37 C)   TempSrc:   Oral   SpO2: 92% 92% 96% 95%  Weight:      Height:        Intake/Output Summary (Last 24 hours) at 09/20/2017 1608 Last data filed at 09/20/2017 1500 Gross per 24 hour  Intake 990 ml  Output -  Net 990 ml   Filed Weights   09/18/17 0410 09/19/17 0500 09/20/17 0500  Weight: 71.9 kg (158 lb 8.2 oz) 74.4 kg (164 lb 0.4 oz) 75.8 kg (167 lb 1.7 oz)    Examination:  General exam: Appears calm and comfortable, O2 via [place]  Psychiatry: Judgement and insight appear normal. Mood & affect appropriate. HEENT: WNLs Respiratory system: Mild diffuse rales, negative for any  crackles, respiratory effort normal. Cardiovascular system: S1 & S2 heard, RRR. No JVD, murmurs, rubs, gallops or clicks. No pedal edema. Gastrointestinal system: Abd. nondistended, soft and nontender. No organomegaly or masses felt. Normal bowel sounds heard. Central nervous system: Alert and oriented. No focal neurological deficits. Extremities: Symmetric 5 x 5 power. Skin: No rashes, lesions or ulcers Wounds:   Data Reviewed: I have personally reviewed following labs and imaging studies  CBC: Recent Labs  Lab 09/17/17 1603 09/18/17 0331 09/19/17 0312 09/20/17 0605  WBC 12.3* 7.5 8.0 7.7  NEUTROABS 6.9  --   --   --   HGB 14.7 12.4* 11.2* 11.1*  HCT 48.2 40.8 37.5* 37.0*  MCV 100.4* 96.7 98.2 97.9  PLT 318 303 282 330   Basic Metabolic Panel: Recent Labs  Lab 09/17/17 1603 09/18/17 0331 09/19/17 0312 09/20/17 0605  NA 139 137 143 146*  K 5.1 3.8 3.7 4.1  CL 97* 99* 105 107  CO2 27 26 32 31  GLUCOSE 198* 223* 151* 144*  BUN 14 19 27* 31*  CREATININE 1.22 1.48* 1.46* 1.24  CALCIUM 8.6* 8.1* 8.1* 8.3*  MG  --  1.8 2.4  --   PHOS  --  <1.0* 3.0 2.8   GFR: Estimated Creatinine Clearance: 56.4 mL/min (by C-G formula based on SCr of 1.24 mg/dL). Liver Function Tests: Recent Labs  Lab 09/17/17 1603  09/18/17 0331  AST 33 53*  ALT 19 48  ALKPHOS 77 57  BILITOT 1.8* 0.9  PROT 7.3 6.0*  ALBUMIN 3.8 3.0*   CBG: Recent Labs  Lab 09/19/17 1107 09/19/17 1637 09/19/17 2112 09/20/17 0721 09/20/17 1109  GLUCAP 162* 178* 208* 140* 235*   Sepsis Labs: Recent Labs  Lab 09/17/17 1604 09/17/17 1848 09/17/17 2118 09/17/17 2309  LATICACIDVEN 7.58* 5.83* 4.0* 2.7*    Recent Results (from the past 240 hour(s))  Culture, blood (Routine X 2) w Reflex to ID Panel     Status: None (Preliminary result)   Collection Time: 09/17/17  4:00 PM  Result Value  Ref Range Status   Specimen Description   Final    BLOOD BLOOD RIGHT FOREARM Performed at [facility] [address] [place], [place] [address]    Special Requests   Final    BOTTLES DRAWN AEROBIC AND ANAEROBIC Blood Culture adequate volume Performed at [facility] [address] [place], [place] [address]    Culture   Final    NO GROWTH 3 DAYS Performed at [facility], [place] [address] [place], [place] [address]    Report Status PENDING  Incomplete  Culture, blood (Routine X 2) w Reflex to ID Panel     Status: None (Preliminary result)   Collection Time: 09/17/17  4:50 PM  Result Value Ref Range Status   Specimen Description   Final    BLOOD BLOOD LEFT FOREARM Performed at [facility] [address], [place], [place] [address]    Special Requests   Final    IN PEDIATRIC BOTTLE Blood Culture adequate volume Performed at [facility] [address], [place], [place] [address]    Culture   Final    NO GROWTH 3 DAYS Performed at [facility], [place] [address] [place], [place] [address]    Report Status PENDING  Incomplete  Respiratory Panel by PCR     Status: Abnormal   Collection Time: 09/17/17  6:30 PM  Result Value Ref Range Status   Adenovirus NOT DETECTED NOT DETECTED Final   Coronavirus 229E NOT DETECTED NOT DETECTED Final     Coronavirus HKU1 NOT DETECTED NOT DETECTED Final   Coronavirus NL63 NOT DETECTED NOT DETECTED Final   Coronavirus OC43 NOT DETECTED NOT DETECTED Final   Metapneumovirus NOT DETECTED NOT DETECTED Final   Rhinovirus / Enterovirus NOT DETECTED NOT DETECTED Final   Influenza A NOT DETECTED NOT DETECTED Final   Influenza A H1 NOT DETECTED NOT DETECTED Final   Influenza A H1 2009 NOT DETECTED NOT DETECTED Final   Influenza A H3 NOT DETECTED NOT DETECTED Final   Influenza B NOT DETECTED NOT DETECTED Final   Parainfluenza Virus 1 NOT DETECTED NOT DETECTED Final   Parainfluenza Virus 2 NOT DETECTED NOT DETECTED Final   Parainfluenza Virus 3 NOT DETECTED NOT DETECTED Final   Parainfluenza Virus 4 NOT DETECTED NOT DETECTED Final   Respiratory Syncytial Virus DETECTED (A) NOT DETECTED Final    Comment: CRITICAL RESULT CALLED TO, READ BACK BY AND VERIFIED WITH: B SCOTT RN 09/17/17 2337 JDW    Bordetella pertussis NOT DETECTED NOT DETECTED Final   Chlamydophila pneumoniae NOT DETECTED NOT DETECTED Final   Mycoplasma pneumoniae NOT DETECTED NOT DETECTED Final    Comment: Performed at [facility], [place] [address] [place], [place] [address]  MRSA PCR Screening     Status: None   Collection Time: 09/19/17  9:12 AM  Result Value Ref Range Status   MRSA by PCR NEGATIVE NEGATIVE Final    Comment:        The GeneXpert MRSA Assay (FDA approved for NASAL specimens only), is one component of a comprehensive MRSA colonization surveillance program. It is not intended to diagnose MRSA infection nor to guide or monitor treatment for MRSA infections. Performed at [facility], [place] [address], [place],  [address]       Radiology Studies: US Renal  Result Date: 09/18/2017 CLINICAL DATA:  72 year old male. Right hydronephrosis versus cyst noted on recent chest CT. Subsequent encounter.  EXAM: RENAL ULTRASOUND COMPARISON:  09/17/2017 chest CT. FINDINGS: Right Kidney: Length:  10.6 cm. Echogenicity within normal limits. 3.6 x 3 x 4.3 cm central/parapelvic cyst. No hydronephrosis. Left Kidney: Length: 10.5 cm. Echogenicity within normal limits. No mass or hydronephrosis visualized. Bladder: Bladder not visualized and may be decompressed. IMPRESSION: 3.6 x 3 x 4.3 cm cyst central aspect right kidney. No hydronephrosis. Electronically Signed   By: Genia Del M.D.   On: 09/18/2017 18:09   Dg Chest Port 1 View  Result Date: 09/19/2017 CLINICAL DATA:  Acute respiratory failure with hypoxia EXAM: PORTABLE CHEST 1 VIEW COMPARISON:  Yesterday FINDINGS: Extubation of the trachea and esophagus. COPD with hyperinflation and interstitial coarsening. No new or acute airspace opacity. No effusion or pneumothorax. Normal heart size. IMPRESSION: 1. Stable chest after extubation. 2. COPD. Electronically Signed   By: Monte Fantasia M.D.   On: 09/19/2017 07:25    Scheduled Meds: . chlorhexidine gluconate (MEDLINE KIT)  15 mL Mouth Rinse BID  . heparin  5,000 Units Subcutaneous Q8H  . insulin aspart  0-15 Units Subcutaneous TID WC  . ipratropium-albuterol  3 mL Nebulization Q4H WA  . mouth rinse  15 mL Mouth Rinse QID  . methylPREDNISolone (SOLU-MEDROL) injection  60 mg Intravenous Q12H   Continuous Infusions: . sodium chloride 10 mL (09/19/17 1455)  . doxycycline (VIBRAMYCIN) IV Stopped (09/20/17 1046)    Time spent: >25 minutes  Deatra Bryndon, MD [facility],  Pager [phone]  If 7PM-7AM, please contact night-coverage [email or web address]   Password [email or web address]  09/20/2017, 4:08 PM

## 2017-09-20 NOTE — Care Management Important Message (Signed)
Important Message  Patient Details  Name: Charli Liberatore MRN: [id number] Date of Birth: 02-22-1946   Medicare Important Message Given:  Yes    Kerin Salen 09/20/2017, 10:53 AMImportant Message  Patient Details  Name: Verlon Pischke MRN: [id number] Date of Birth: 1946-02-07   Medicare Important Message Given:  Yes    Kerin Salen 09/20/2017, 10:53 AM

## 2017-09-21 LAB — GLUCOSE, CAPILLARY
Glucose-Capillary: 126 mg/dL — ABNORMAL HIGH (ref 65–99)
Glucose-Capillary: 163 mg/dL — ABNORMAL HIGH (ref 65–99)

## 2017-09-21 MED ORDER — AMLODIPINE BESYLATE 5 MG PO TABS
5.0000 mg | ORAL_TABLET | Freq: Every day | ORAL | Status: DC
  Administered 2017-09-21: 5 mg via ORAL
  Filled 2017-09-21: qty 1

## 2017-09-21 MED ORDER — BUDESONIDE-FORMOTEROL FUMARATE 160-4.5 MCG/ACT IN AERO: 2.0000 | INHALATION_SPRAY | Freq: Two times a day (BID) | RESPIRATORY_TRACT | 0 refills | Status: DC

## 2017-09-21 MED ORDER — ALBUTEROL SULFATE HFA 108 (90 BASE) MCG/ACT IN AERS: 2.0000 | INHALATION_SPRAY | Freq: Four times a day (QID) | RESPIRATORY_TRACT | 0 refills | Status: DC | PRN

## 2017-09-21 MED ORDER — DOXYCYCLINE HYCLATE 100 MG PO TABS
100.0000 mg | ORAL_TABLET | Freq: Two times a day (BID) | ORAL | Status: DC
  Administered 2017-09-21: 100 mg via ORAL
  Filled 2017-09-21: qty 1

## 2017-09-21 MED ORDER — AMLODIPINE BESYLATE 5 MG PO TABS: 5.0000 mg | ORAL_TABLET | Freq: Every day | ORAL | 0 refills | Status: DC

## 2017-09-21 MED ORDER — ALBUTEROL SULFATE HFA 108 (90 BASE) MCG/ACT IN AERS: 2.0000 | INHALATION_SPRAY | RESPIRATORY_TRACT | 2 refills | Status: AC | PRN

## 2017-09-21 NOTE — Evaluation (Signed)
Occupational Therapy Evaluation Patient Details Name: George Frazier MRN: [id number] DOB: 03/07/1946 Today's Date: 09/21/2017    History of Present Illness 72 y/o M with O2 dependent COPD admitted on 09/17/17 with acute hypercapnic respiratory failure in the setting of RSV.  ETT 3/4-09/18/17   Clinical Impression   OT eval and education complete.  Pt has A at home as well as DME. Educated in energy conservation strategies and handout provided    Follow Up Recommendations  No OT follow up    Equipment Recommendations  None recommended by OT    Recommendations for Other Services       Precautions / Restrictions Precautions Precautions: Fall Precaution Comments: monitor sats Restrictions Weight Bearing Restrictions: No      Mobility Bed Mobility Overal bed mobility: Independent                Transfers Overall transfer level: Needs assistance Equipment used: Rolling walker (2 wheeled) Transfers: Sit to/from [facility] Sit to Stand: Supervision Stand pivot transfers: Supervision                ADL either performed or assessed with clinical judgement   ADL Overall ADL's : Needs assistance/impaired                                       General ADL Comments: Pt overall S with ADL activity.   OT educated pt on energy conservation strategies and provided handout for pt. Pt will benefit fiorm HHOT to address ADL actiivity in the home.      Vision Patient Visual Report: No change from baseline              Pertinent Vitals/Pain Pain Assessment: No/denies pain     Hand Dominance     Extremity/Trunk Assessment Upper Extremity Assessment Upper Extremity Assessment: Generalized weakness           Communication Communication Communication: No difficulties   Cognition Arousal/Alertness: Awake/alert Behavior During Therapy: WFL for tasks assessed/performed Overall Cognitive Status: Within Functional Limits for tasks  assessed                                                Home Living Family/patient expects to be discharged to:: Private residence Living Arrangements: Alone Available Help at Discharge: Neighbor Type of Home: House Home Access: Level entry     Home Layout: Able to live on main level with bedroom/bathroom     Bathroom Shower/Tub: Teacher, early years/pre: Standard     Home Equipment: Shower seat   Additional Comments: pt reports he will d/c to neighbor's home, no steps      Prior Functioning/Environment Level of Independence: Independent                 OT Problem List: Decreased strength;Decreased activity tolerance      OT Treatment/Interventions:      OT Goals(Current goals can be found in the care plan section) Acute Rehab OT Goals Patient Stated Goal: go home and get stronger OT Goal Formulation: With patient  OT Frequency:                AM-PAC PT "6 Clicks" Daily Activity     Outcome Measure Help from another person  eating meals?: None Help from another person taking care of personal grooming?: None Help from another person toileting, which includes using toliet, bedpan, or urinal?: A Little Help from another person bathing (including washing, rinsing, drying)?: A Little Help from another person to put on and taking off regular upper body clothing?: None Help from another person to put on and taking off regular lower body clothing?: A Little 6 Click Score: 21   End of Session Equipment Utilized During Treatment: Rolling walker  Activity Tolerance: Patient tolerated treatment well Patient left: in chair;with call bell/phone within reach  OT Visit Diagnosis: Muscle weakness (generalized) (M62.81)                Time: 1010-1030 OT Time Calculation (min): 20 min Charges:  OT General Charges $OT Visit: 1 Visit OT Evaluation $OT Eval Moderate Complexity: 1 Mod G-Codes:     Kari Baars, [place]  Payton Mccallum D 09/21/2017, 12:12 PM

## 2017-09-21 NOTE — Discharge Summary (Signed)
Physician Discharge Summary [facility]     Patient: George Frazier                   Admit date: 09/17/2017   DOB: May 25, 1946             Discharge date:09/21/2017/10:12 AM PYK:[id number]                           PCP: George Downing, MD Recommendations for Outpatient Follow-up:   1.  Please follow-up with your primary care physician within 1-2 weeks. 2.  Please follow-up with pulmonologist in 1-2 weeks  Discharge Condition: Stable  CODE STATUS:  Full code   Diet recommendation:  Regular healthy diet  ----------------------------------------------------------------------------------------------------------------------  Discharge Diagnoses:   Active Problems:   Acute respiratory failure with hypoxia (HCC)   History of present illness : Mr. George Frazier. Paci is a 72 year old male with extensive history of emphysema/COPD, O2 dependent, on 09/17/2017 was hospitalized for acute respiratory failure, hypoxia, diaphoretic, lethargic was intubated in ED, then admitted to ICU. Status post extubation on 09/19/2017 stabilized transferred out of ICU.  Note patient has been recently hospitalized for acute COPD exacerbation was treated with antibiotics and steroids.  Hospital course / Brief Summary: Acute on chronic respiratory failure/COPD exacerbation/RSV viral infection -Status post admission on 09/17/2017 hospitalization intubation transferred to ICU, status post extubation on 09/19/2017 stabilized transferred out of ICU -Stable on O2 via nasal cannula, satting greater than 92% -CTA on 09/18/2018 19- for PE,  appearance cystic appearance in the right kidney -was on  IV Solu-Medrol, DuoNeb bronchodilators PRN, -was on antibodies of doxycycline -Added as needed albuterol/continue Symbicort inhalers -Influenza screen negative, HIV nonreactive, -Blood cultures negative to date  Lactic acidosis secondary to respiratory failure -Resolved   Hypotension on admission -Resolved,   Questionable history of systolic versus diastolic heart failure -Echocardiogram from 07/15/2017 was reviewed, normal ejection fraction of 55-60%, negative for any LV dysfunction -Negative for any edema -Home medications were reviewed not on any heart failure medications. -Continue to monitor   Disposition-cleared to be discharged home   Consultations:  Critical pulmonary  Procedures:  Patient/extubation  ----------------------------------------------------------------------------------------------------------------------  Discharge Instructions:   Discharge Instructions    Activity as tolerated - No restrictions   Complete by:  As directed    Diet - low sodium heart healthy   Complete by:  As directed    Discharge instructions   Complete by:  As directed    F/up with PCP, and Pulm. In 1-2 weeks   Increase activity slowly   Complete by:  As directed        Medication List    STOP taking these medications   predniSONE 10 MG tablet Commonly known as:  DELTASONE     TAKE these medications   albuterol 108 (90 Base) MCG/ACT inhaler Commonly known as:  PROVENTIL HFA;VENTOLIN HFA Inhale 2 puffs into the lungs every 6 (six) hours as needed for wheezing or shortness of breath. What changed:  Another medication with the same name was added. Make sure you understand how and when to take each.   albuterol 108 (90 Base) MCG/ACT inhaler Commonly known as:  PROVENTIL HFA;VENTOLIN HFA Inhale 2 puffs into the lungs every 4 (four) hours as needed for wheezing or shortness of breath. What changed:  You were already taking a medication with the same name, and this prescription was added. Make sure you understand how and when to take  each.   amLODipine 5 MG tablet Commonly known as:  NORVASC Take 1 tablet (5 mg total) by mouth daily. Start taking on:  09/22/2017   budesonide-formoterol 160-4.5 MCG/ACT inhaler Commonly known as:  SYMBICORT Inhale 2 puffs into the lungs 2 (two)  times daily at 10 AM and 5 PM.   guaiFENesin 600 MG 12 hr tablet Commonly known as:  MUCINEX Take 1 tablet (600 mg total) by mouth 2 (two) times daily as needed for cough or to loosen phlegm.      Follow-up Information    George Garfinkel, MD Follow up on 10/22/2017.   Specialty:  Pulmonary Disease Why:  Appt at 1:30 PM.  Please arrive at 1:15 for check in.  Contact information: [address] [place] [address] [phone]          No Known Allergies    Procedures/Studies: Dg Chest 2 View  Result Date: 09/12/2017 CLINICAL DATA:  Follow-up pneumonia EXAM: CHEST  2 VIEW COMPARISON:  07/25/2017, 07/17/2017 FINDINGS: Hyperinflationwith emphysematous disease. Biapical pleural and parenchymal scarring as before. Clearing of previously noted left pleural effusion and basilar infiltrate. No acute consolidation. Normal cardiomediastinal silhouette with aortic atherosclerosis. No pneumothorax. IMPRESSION: 1. Clearing of previously noted left pleural effusion and basilar infiltrate 2. Hyperinflation with emphysematous disease. Electronically Signed   By: Donavan Foil M.D.   On: 09/12/2017 15:22   Ct Head Wo Contrast  Result Date: 09/17/2017 CLINICAL DATA:  Cough and decreased appetite. Respiratory distress requiring intubation. EXAM: CT HEAD WITHOUT CONTRAST TECHNIQUE: Contiguous axial images were obtained from the base of the skull through the vertex without intravenous contrast. COMPARISON:  None. FINDINGS: Brain: There is no evidence of acute intracranial hemorrhage, mass lesion, brain edema or extra-axial fluid collection. There is diffuse prominence of the ventricles and subarachnoid spaces consistent with atrophy. There is mild low-density in the periventricular white matter, likely due to chronic small vessel ischemic changes. There is no CT evidence of acute cortical infarction. Vascular: Intracranial vascular calcifications. No hyperdense vessel identified. Skull: Negative  for fracture or focal lesion. Sinuses/Orbits: There is mucosal thickening throughout the maxillary and ethmoid sinuses bilaterally. No definite air-fluid levels. The mastoid air cells and middle ears are clear. No significant orbital findings. Patient is intubated. Other: None. IMPRESSION: 1. No acute intracranial findings. 2. Atrophy and chronic small vessel ischemic changes. 3. Bilateral maxillary and ethmoid sinus mucosal thickening. Electronically Signed   By: Richardean Sale M.D.   On: 09/17/2017 21:02   Ct Angio Chest Pe W And/or Wo Contrast  Result Date: 09/17/2017 CLINICAL DATA:  Cough and decreased appetite respiratory distress EXAM: CT ANGIOGRAPHY CHEST WITH CONTRAST TECHNIQUE: Multidetector CT imaging of the chest was performed using the standard protocol during bolus administration of intravenous contrast. Multiplanar CT image reconstructions and MIPs were obtained to evaluate the vascular anatomy. CONTRAST:  132mL ISOVUE-370 IOPAMIDOL (ISOVUE-370) INJECTION 76% COMPARISON:  Radiographs 09/17/2017 FINDINGS: Cardiovascular: Satisfactory opacification of the pulmonary arteries to the segmental level. No evidence of pulmonary embolism. Nonaneurysmal aorta. No dissection is seen. Moderate aortic atherosclerosis. Mild coronary calcification. Normal heart size. No significant pericardial effusion. Mediastinum/Nodes: Endotracheal tube tip about 3.5 cm superior to the carina. No thyroid mass. Mild mediastinal lymph nodes, for example right paratracheal lymph node measures 9 mm. AP window lymph nodes measuring 9 mm. Precarinal lymph node measures 14 mm. Small right hilar lymph nodes measuring up to 11 mm. Esophageal tube tip is in the mid stomach. Lungs/Pleura: Severe emphysema. Biapical scarring.  No pleural effusion, pneumothorax or consolidative process. Mild peribronchial thickening. Subtle tree-in-bud densities within the right upper, right middle and right lower lobes. Upper Abdomen: Partially visualized  cyst or hydronephrosis in the upper pole of the right kidney Musculoskeletal: Degenerative changes. No acute or suspicious lesion. Review of the MIP images confirms the above findings. IMPRESSION: 1. Negative for acute pulmonary embolus. 2. Severe emphysema. No focal consolidation or focal airspace disease. Few subtle foci of mild tree-in-bud density, mostly within the right lung suggestive of bronchiolitis. Mild peribronchial thickening, could be due to central airways inflammation. 3. Mild mediastinal lymph nodes, likely reactive 4. Partially visualized cyst versus hydronephrosis in the upper pole of the right kidney Aortic Atherosclerosis (ICD10-I70.0) and Emphysema (ICD10-J43.9). Electronically Signed   By: Donavan Foil M.D.   On: 09/17/2017 21:09   US Renal  Result Date: 09/18/2017 CLINICAL DATA:  72 year old male. Right hydronephrosis versus cyst noted on recent chest CT. Subsequent encounter. EXAM: RENAL ULTRASOUND COMPARISON:  09/17/2017 chest CT. FINDINGS: Right Kidney: Length: 10.6 cm. Echogenicity within normal limits. 3.6 x 3 x 4.3 cm central/parapelvic cyst. No hydronephrosis. Left Kidney: Length: 10.5 cm. Echogenicity within normal limits. No mass or hydronephrosis visualized. Bladder: Bladder not visualized and may be decompressed. IMPRESSION: 3.6 x 3 x 4.3 cm cyst central aspect right kidney. No hydronephrosis. Electronically Signed   By: Genia Del M.D.   On: 09/18/2017 18:09   Dg Chest Port 1 View  Result Date: 09/19/2017 CLINICAL DATA:  Acute respiratory failure with hypoxia EXAM: PORTABLE CHEST 1 VIEW COMPARISON:  Yesterday FINDINGS: Extubation of the trachea and esophagus. COPD with hyperinflation and interstitial coarsening. No new or acute airspace opacity. No effusion or pneumothorax. Normal heart size. IMPRESSION: 1. Stable chest after extubation. 2. COPD. Electronically Signed   By: Monte Fantasia M.D.   On: 09/19/2017 07:25   Dg Chest Port 1 View  Result Date:  09/18/2017 CLINICAL DATA:  Respiratory failure EXAM: PORTABLE CHEST 1 VIEW COMPARISON:  09/17/2017 FINDINGS: NG tube has been advanced into the stomach. Endotracheal tube is unchanged. Hyperinflation of the lungs. Heart is normal size. No confluent opacities or effusions. IMPRESSION: COPD.  No acute findings.  Support devices stable. Electronically Signed   By: Rolm Baptise M.D.   On: 09/18/2017 07:19   Dg Chest Portable 1 View  Result Date: 09/17/2017 CLINICAL DATA:  Intubation post code EXAM: PORTABLE CHEST 1 VIEW COMPARISON:  09/12/2017 FINDINGS: Endotracheal tube in good position. NG tip difficult to visualize but may be in the distal esophagus above the GE junction. COPD with pulmonary hyperinflation. Negative for heart failure or pneumonia IMPRESSION: Endotracheal tube in good position.  COPD.  Lungs are clear NG tube tip may be in the distal esophagus. Electronically Signed   By: Franchot Gallo M.D.   On: 09/17/2017 16:25      Subjective: Patient was seen and examined 09/21/2017, 10:12 AM Patient stable  Today. No acute distress.  No issues overnight Stable for discharge.  Discharge Exam:  Vitals:   09/20/17 2212 09/21/17 0300 09/21/17 0451 09/21/17 0901  BP:   (!) 167/98   Pulse:   97   Resp:   18   Temp:   99 F (37.2 C)   TempSrc:   Oral   SpO2: 94%  100% 93%  Weight:  76.2 kg (168 lb)    Height:        General: Pt lying comfortably in bed & appears in no obvious distress. Cardiovascular: S1 & S2  heard, RRR, S1/S2 +. No murmurs, rubs, gallops or clicks. No JVD or pedal edema. Respiratory: Clear to auscultation without wheezing, rhonchi or crackles. No increased work of breathing. Abdominal:  Non distended, non tender & soft. No organomegaly or masses appreciated. Normal bowel sounds heard. CNS: Alert and oriented. No focal deficits. Extremities: no edema, no cyanosis    The results of significant diagnostics from this hospitalization (including imaging, microbiology,  ancillary and laboratory) are listed below for reference.     Microbiology: Recent Results (from the past 240 hour(s))  Culture, blood (Routine X 2) w Reflex to ID Panel     Status: None (Preliminary result)   Collection Time: 09/17/17  4:00 PM  Result Value Ref Range Status   Specimen Description   Final    BLOOD BLOOD RIGHT FOREARM Performed at [facility] [address], [place], [place] [address]    Special Requests   Final    BOTTLES DRAWN AEROBIC AND ANAEROBIC Blood Culture adequate volume Performed at [facility] [address] [place], [place] [address]    Culture   Final    NO GROWTH 3 DAYS Performed at [facility], [place] [address], [place], [place] [address]    Report Status PENDING  Incomplete  Culture, blood (Routine X 2) w Reflex to ID Panel     Status: None (Preliminary result)   Collection Time: 09/17/17  4:50 PM  Result Value Ref Range Status   Specimen Description   Final    BLOOD BLOOD LEFT FOREARM Performed at [facility] [address], [place], [place] [address]    Special Requests   Final    IN PEDIATRIC BOTTLE Blood Culture adequate volume Performed at [facility] [address] [place], [place] [address]    Culture   Final    NO GROWTH 3 DAYS Performed at [facility], [place] [address], [place], [place] [address]    Report Status PENDING  Incomplete  Respiratory Panel by PCR     Status: Abnormal   Collection Time: 09/17/17  6:30 PM  Result Value Ref Range Status   Adenovirus NOT DETECTED NOT DETECTED Final   Coronavirus 229E NOT DETECTED NOT DETECTED Final   Coronavirus HKU1 NOT DETECTED NOT DETECTED Final   Coronavirus NL63 NOT DETECTED NOT DETECTED Final   Coronavirus OC43 NOT DETECTED NOT DETECTED Final   Metapneumovirus NOT DETECTED NOT DETECTED Final   Rhinovirus / Enterovirus NOT DETECTED NOT DETECTED Final   Influenza A NOT DETECTED NOT DETECTED  Final   Influenza A H1 NOT DETECTED NOT DETECTED Final   Influenza A H1 2009 NOT DETECTED NOT DETECTED Final   Influenza A H3 NOT DETECTED NOT DETECTED Final   Influenza B NOT DETECTED NOT DETECTED Final   Parainfluenza Virus 1 NOT DETECTED NOT DETECTED Final   Parainfluenza Virus 2 NOT DETECTED NOT DETECTED Final   Parainfluenza Virus 3 NOT DETECTED NOT DETECTED Final   Parainfluenza Virus 4 NOT DETECTED NOT DETECTED Final   Respiratory Syncytial Virus DETECTED (A) NOT DETECTED Final    Comment: CRITICAL RESULT CALLED TO, READ BACK BY AND VERIFIED WITH: B SCOTT RN 09/17/17 2337 JDW    Bordetella pertussis NOT DETECTED NOT DETECTED Final   Chlamydophila pneumoniae NOT DETECTED NOT DETECTED Final   Mycoplasma pneumoniae NOT DETECTED NOT DETECTED Final    Comment: Performed at [facility], [place] [address], [place],  [address]  MRSA PCR Screening     Status: None   Collection Time: 09/19/17  9:12 AM  Result Value Ref Range Status   MRSA by PCR NEGATIVE NEGATIVE Final    Comment:        The GeneXpert MRSA Assay (FDA approved for NASAL specimens only), is one component of a comprehensive MRSA colonization surveillance program. It is not intended to diagnose MRSA infection nor to guide or monitor treatment for MRSA infections. Performed at [facility], [place] [address], [place], [place] [address]      Labs: CBC: Recent Labs  Lab 09/17/17 1603 09/18/17 0331 09/19/17 0312 09/20/17 0605  WBC 12.3* 7.5 8.0 7.7  NEUTROABS 6.9  --   --   --   HGB 14.7 12.4* 11.2* 11.1*  HCT 48.2 40.8 37.5* 37.0*  MCV 100.4* 96.7 98.2 97.9  PLT 318 303 282 427   Basic Metabolic Panel: Recent Labs  Lab 09/17/17 1603 09/18/17 0331 09/19/17 0312 09/20/17 0605  NA 139 137 143 146*  K 5.1 3.8 3.7 4.1  CL 97* 99* 105 107  CO2 27 26 32 31  GLUCOSE 198* 223* 151* 144*  BUN 14 19 27* 31*  CREATININE 1.22 1.48* 1.46* 1.24  CALCIUM 8.6* 8.1* 8.1* 8.3*  MG  --  1.8  2.4  --   PHOS  --  <1.0* 3.0 2.8   Liver Function Tests: Recent Labs  Lab 09/17/17 1603 09/18/17 0331  AST 33 53*  ALT 19 48  ALKPHOS 77 57  BILITOT 1.8* 0.9  PROT 7.3 6.0*  ALBUMIN 3.8 3.0*   BNP (last 3 results) Recent Labs    07/14/17 1711 09/17/17 1600  BNP 606.0* 403.4*   Cardiac Enzymes: No results for input(s): CKTOTAL, CKMB, CKMBINDEX, TROPONINI in the last 168 hours. CBG: Recent Labs  Lab 09/20/17 0721 09/20/17 1109 09/20/17 1629 09/20/17 2102 09/21/17 0723  GLUCAP 140* 235* 103* 173* 126*   Hgb A1c No results for input(s): HGBA1C in the last 72 hours. Lipid Profile No results for input(s): CHOL, HDL, LDLCALC, TRIG, CHOLHDL, LDLDIRECT in the last 72 hours. Thyroid function studies No results for input(s): TSH, T4TOTAL, T3FREE, THYROIDAB in the last 72 hours.  Invalid input(s): FREET3 Anemia work up No results for input(s): VITAMINB12, FOLATE, FERRITIN, TIBC, IRON, RETICCTPCT in the last 72 hours. Urinalysis    Component Value Date/Time   COLORURINE YELLOW 09/17/2017 1830   APPEARANCEUR CLEAR 09/17/2017 1830   LABSPEC 1.023 09/17/2017 1830   PHURINE 5.0 09/17/2017 1830   GLUCOSEU 50 (A) 09/17/2017 1830   HGBUR NEGATIVE 09/17/2017 1830   [place] NEGATIVE 09/17/2017 1830   KETONESUR NEGATIVE 09/17/2017 1830   PROTEINUR 100 (A) 09/17/2017 1830   NITRITE NEGATIVE 09/17/2017 1830   LEUKOCYTESUR NEGATIVE 09/17/2017 1830    Time coordinating discharge: Over 30 minutes  SIGNED: Deatra Bonifacio, MD, FACP, FHM. [facility],  Pager [phone]  If 7PM-7AM, please contact night-coverage [email or web address], Password [facility] 09/21/2017, 10:12 AM

## 2017-09-21 NOTE — Progress Notes (Signed)
Physical Therapy Treatment Patient Details Name: George Frazier MRN: [id number] DOB: 1946-05-31 Today's Date: 09/21/2017    History of Present Illness 72 y/o M with O2 dependent COPD admitted on 09/17/17 with acute hypercapnic respiratory failure in the setting of RSV.  ETT 3/4-09/18/17    PT Comments    Pt OOB in recliner on 2 lts nasal at rest 96%.  Assisted with amb on 2 lts but after 50 feet sats decreased to 83% and noted 2/4 dyspnea.  Pt required a standing rest break and increased to 3 lts to achieve therapeutic levels of 88% or >.   Pt aware of when he needs to stop and rest, "been dealing with this for a while".   Follow Up Recommendations  Home health PT;Supervision for mobility/OOB     Equipment Recommendations  Rolling walker with 5" wheels    Recommendations for Other Services       Precautions / Restrictions Precautions Precautions: Fall Precaution Comments: monitor sats Restrictions Weight Bearing Restrictions: No    Mobility  Bed Mobility Overal bed mobility: Independent             General bed mobility comments: Pt OOB in recliner   Transfers Overall transfer level: Needs assistance Equipment used: Rolling walker (2 wheeled) Transfers: Sit to/from [employer] Transfers Sit to Stand: Supervision Stand pivot transfers: Supervision       General transfer comment: <25% VC's safety with turns  Ambulation/Gait Ambulation/Gait assistance: Supervision Ambulation Distance (Feet): 95 Feet Assistive device: Rolling walker (2 wheeled) Gait Pattern/deviations: Step-through pattern;Decreased stride length;Trunk flexed Gait velocity: decreased   General Gait Details: started on 2 lts nasal which pt stated he has been on since Oct 2017 but after 50 feet sats decreased to 83% with noted 2/4 dyspnea.  Allowed a rest break and increased to 3 lts to achieve therapeutic level of 88% >.  Pt is aware when he needs to staop and rest.  "been dealing with this for  a while".     Stairs            Wheelchair Mobility    Modified Rankin (Stroke Patients Only)       Balance                                            Cognition Arousal/Alertness: Awake/alert Behavior During Therapy: WFL for tasks assessed/performed Overall Cognitive Status: Within Functional Limits for tasks assessed                                        Exercises      General Comments        Pertinent Vitals/Pain Pain Assessment: No/denies pain    Home Living Family/patient expects to be discharged to:: Private residence Living Arrangements: Alone Available Help at Discharge: Neighbor Type of Home: House Home Access: Level entry   Home Layout: Able to live on main level with bedroom/bathroom Home Equipment: Shower seat Additional Comments: pt reports he will d/c to neighbor's home, no steps    Prior Function Level of Independence: Independent          PT Goals (current goals can now be found in the care plan section) Acute Rehab PT Goals Patient Stated Goal: go home and get stronger Progress towards PT  goals: Progressing toward goals    Frequency    Min 3X/week      PT Plan Current plan remains appropriate    Co-evaluation              AM-PAC PT "6 Clicks" Daily Activity  Outcome Measure  Difficulty turning over in bed (including adjusting bedclothes, sheets and blankets)?: A Little Difficulty moving from lying on back to sitting on the side of the bed? : A Little Difficulty sitting down on and standing up from a chair with arms (e.g., wheelchair, bedside commode, etc,.)?: A Little Help needed moving to and from a bed to chair (including a wheelchair)?: A Little Help needed walking in hospital room?: A Little Help needed climbing 3-5 steps with a railing? : A Lot 6 Click Score: 17    End of Session Equipment Utilized During Treatment: Gait belt;Oxygen Activity Tolerance: Patient tolerated  treatment well Patient left: in chair;with call bell/phone within reach Nurse Communication: Mobility status       Time: 2979-8921 PT Time Calculation (min) (ACUTE ONLY): 18 min  Charges:  $Gait Training: 8-22 mins                    G Codes:       Rica Koyanagi  PTA [facility]  Acute  Rehab Pager      [phone]

## 2017-09-22 LAB — CULTURE, BLOOD (ROUTINE X 2)
Culture: NO GROWTH
Culture: NO GROWTH
SPECIAL REQUESTS: ADEQUATE
SPECIAL REQUESTS: ADEQUATE

## 2017-10-22 ENCOUNTER — Other Ambulatory Visit (INDEPENDENT_AMBULATORY_CARE_PROVIDER_SITE_OTHER): Payer: Medicare Other

## 2017-10-22 ENCOUNTER — Ambulatory Visit (INDEPENDENT_AMBULATORY_CARE_PROVIDER_SITE_OTHER): Payer: Medicare Other | Admitting: Pulmonary Disease

## 2017-10-22 ENCOUNTER — Encounter: Payer: Self-pay | Admitting: Pulmonary Disease

## 2017-10-22 VITALS — BP 132/74 | HR 95 | Ht 72.0 in | Wt 153.0 lb

## 2017-10-22 DIAGNOSIS — R911 Solitary pulmonary nodule: Secondary | ICD-10-CM | POA: Diagnosis not present

## 2017-10-22 DIAGNOSIS — J439 Emphysema, unspecified: Secondary | ICD-10-CM

## 2017-10-22 LAB — CBC WITH DIFFERENTIAL/PLATELET
BASOS ABS: 0.1 10*3/uL (ref 0.0–0.1)
Basophils Relative: 1 % (ref 0.0–3.0)
Eosinophils Absolute: 0.2 10*3/uL (ref 0.0–0.7)
Eosinophils Relative: 2.2 % (ref 0.0–5.0)
HCT: 40.1 % (ref 39.0–52.0)
Hemoglobin: 13 g/dL (ref 13.0–17.0)
LYMPHS ABS: 2.5 10*3/uL (ref 0.7–4.0)
LYMPHS PCT: 22.5 % (ref 12.0–46.0)
MCHC: 32.5 g/dL (ref 30.0–36.0)
MCV: 91.2 fl (ref 78.0–100.0)
MONOS PCT: 10 % (ref 3.0–12.0)
Monocytes Absolute: 1.1 10*3/uL — ABNORMAL HIGH (ref 0.1–1.0)
NEUTROS PCT: 64.3 % (ref 43.0–77.0)
Neutro Abs: 7.3 10*3/uL (ref 1.4–7.7)
Platelets: 535 10*3/uL — ABNORMAL HIGH (ref 150.0–400.0)
RBC: 4.4 Mil/uL (ref 4.22–5.81)
RDW: 14.8 % (ref 11.5–15.5)
WBC: 11.3 10*3/uL — ABNORMAL HIGH (ref 4.0–10.5)

## 2017-10-22 MED ORDER — UMECLIDINIUM-VILANTEROL 62.5-25 MCG/INH IN AEPB: 1.0000 | INHALATION_SPRAY | Freq: Every day | RESPIRATORY_TRACT | 5 refills | Status: DC

## 2017-10-22 MED ORDER — UMECLIDINIUM-VILANTEROL 62.5-25 MCG/INH IN AEPB: 1.0000 | INHALATION_SPRAY | Freq: Every day | RESPIRATORY_TRACT | 0 refills | Status: AC

## 2017-10-22 NOTE — Progress Notes (Signed)
George Frazier    [id number]    12/11/1945  Primary Care Physician:Opalski, Neoma Laming, DO  Referring Physician: Leonard Downing, MD [address] [place], [place] [address]  Chief complaint: Follow-up after hospitalization, COPD  HPI: 72 year old with history of COPD on home oxygen with recurrent admissions for COPD exacerbation his last admission was on 3/4 when he was admitted with an acute exacerbation in setting of RSV.  He was intubated for 1 day.  Extubated and discharged on Symbicort.  He is finished using the Symbicort but has no refills left.  He is just using the albuterol every 4 hours Reports chronic dyspnea on exertion, cough with white mucus.  Denies any fevers, chills  He has a strong family history of emphysema in with his father and his brothers who are all smokers.  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  Currently retired Exposures: No known exposures, no asbestos, hot tub, mold, Smoking history: 40-pack-year smoking history.  Quit in 2009 Travel History: Not significant  Outpatient Encounter Medications as of 10/22/2017  Medication Sig  . albuterol (PROVENTIL HFA;VENTOLIN HFA) 108 (90 Base) MCG/ACT inhaler Inhale 2 puffs into the lungs every 4 (four) hours as needed for wheezing or shortness of breath.  Marland Kitchen amLODipine (NORVASC) 5 MG tablet Take 1 tablet (5 mg total) by mouth daily.  Marland Kitchen guaiFENesin (MUCINEX) 600 MG 12 hr tablet Take 1 tablet (600 mg total) by mouth 2 (two) times daily as needed for cough or to loosen phlegm.  . budesonide-formoterol (SYMBICORT) 160-4.5 MCG/ACT inhaler Inhale 2 puffs into the lungs 2 (two) times daily at 10 AM and 5 PM.  . [DISCONTINUED] albuterol (PROVENTIL HFA;VENTOLIN HFA) 108 (90 Base) MCG/ACT inhaler Inhale 2 puffs into the lungs every 6 (six) hours as needed for wheezing or shortness of breath.   No facility-administered encounter  medications on file as of 10/22/2017.     Allergies as of 10/22/2017  . (No Known Allergies)    Past Medical History:  Diagnosis Date  . CHF (congestive heart failure) ([place])   . COPD (chronic obstructive pulmonary disease) ([facility])     No past surgical history on file.  Family History  Problem Relation Age of Onset  . CAD Mother 58  . COPD Father 51  . AAA (abdominal aortic aneurysm) Father   . Cancer Neg Hx     Social History   Socioeconomic History  . Marital status: Single    Spouse name: Not on file  . Number of children: Not on file  . Years of education: Not on file  . Highest education level: Not on file  Occupational History  . Occupation: retired  Scientific laboratory technician  . Financial resource strain: Not on file  . Food insecurity:    Worry: Not on file    Inability: Not on file  . Transportation needs:    Medical: Not on file    Non-medical: Not on file  Tobacco Use  . Smoking status: Former Smoker    Packs/day: 1.00    Years: 40.00    Pack years: 40.00    Last attempt to quit: 2008    Years since quitting: 11.2  . Smokeless tobacco: Former Network engineer and Sexual Activity  . Alcohol use: No    Frequency: Never  . Drug use: No  . Sexual activity: Never  Lifestyle  . Physical activity:  Days per week: Not on file    Minutes per session: Not on file  . Stress: Not on file  Relationships  . Social connections:    Talks on phone: Not on file    Gets together: Not on file    Attends religious service: Not on file    Active member of club or organization: Not on file    Attends meetings of clubs or organizations: Not on file    Relationship status: Not on file  . Intimate partner violence:    Fear of current or ex partner: Not on file    Emotionally abused: Not on file    Physically abused: Not on file    Forced sexual activity: Not on file  Other Topics Concern  . Not on file  Social History Narrative  . Not on file   Review of systems: Review  of Systems  Constitutional: Negative for fever and chills.  HENT: Negative.   Eyes: Negative for blurred vision.  Respiratory: as per HPI  Cardiovascular: Negative for chest pain and palpitations.  Gastrointestinal: Negative for vomiting, diarrhea, blood per rectum. Genitourinary: Negative for dysuria, urgency, frequency and hematuria.  Musculoskeletal: Negative for myalgias, back pain and joint pain.  Skin: Negative for itching and rash.  Neurological: Negative for dizziness, tremors, focal weakness, seizures and loss of consciousness.  Endo/Heme/Allergies: Negative for environmental allergies.  Psychiatric/Behavioral: Negative for depression, suicidal ideas and hallucinations.  All other systems reviewed and are negative.  Physical Exam: Blood pressure 132/74, pulse 95, height 6' (1.829 m), weight 153 lb (69.4 kg), SpO2 95 %. Gen:      No acute distress HEENT:  EOMI, sclera anicteric Neck:     No masses; no thyromegaly Lungs:    Clear to auscultation bilaterally; normal respiratory effort CV:         Regular rate and rhythm; no murmurs Abd:      + bowel sounds; soft, non-tender; no palpable masses, no distension Ext:    No edema; adequate peripheral perfusion Skin:      Warm and dry; no rash Neuro: alert and oriented x 3 Psych: normal mood and affect  Data Reviewed: CT chest 09/27/17-no pulmonary embolism, tree-in-bud appearance, cystic appearance in the right kidney. Chest x-ray 09/19/17-hyperinflation.  No acute abnormality  have reviewed the images personally.  Renal ultrasound 09/18/17-4.3 cm cyst in the right kidney.  No hydronephrosis.  CBC 09/17/17-WBC 12.3, eos 0%  Assessment:  Severe COPD, oxygen dependent Recent exacerbation in setting of RSV infection.  He is making a slow recovery post hospitalization Is currently just on albuterol inhaler.  We will start him on anoro Check CBC differential, PFTs, alpha-1 antitrypsin levels and phenotype Checked oxygen levels on  exertion. Continue O2 at 2LPM  Abnormal CT, mediastinal lymph nodes CT scan reviewed with scattered tree-in-bud, subcentimeter nodules likely secondary to inflammation.  Follow-up with repeat CT in 1 year  Plan/Recommendations: - Start Anoro, continue albuterol - Check CBC differential, PFTs, alpha-1 antitrypsin - Supplemental oxygen - Follow-up CT in 1 year.  Marshell Garfinkel MD [facility] Pager [phone] 10/22/2017, 1:31 PM  CC: Leonard Downing, *

## 2017-10-22 NOTE — Patient Instructions (Signed)
I am glad that your breathing is doing okay after your recent hospitalization We will start you on an inhaler called anoro and check your oxygen levels today We will schedule you for pulmonary function test Check CBC differential, alpha-1 antitrypsin levels and phenotype Follow-up in 1-2 months.

## 2017-10-26 LAB — ALPHA-1 ANTITRYPSIN PHENOTYPE: A-1 Antitrypsin, Ser: 235 mg/dL — ABNORMAL HIGH (ref 83–199)

## 2017-11-06 ENCOUNTER — Encounter: Payer: Self-pay | Admitting: Family Medicine

## 2017-11-06 ENCOUNTER — Ambulatory Visit (INDEPENDENT_AMBULATORY_CARE_PROVIDER_SITE_OTHER): Payer: Medicare Other | Admitting: Family Medicine

## 2017-11-06 VITALS — BP 176/103 | HR 107 | Ht 72.0 in | Wt 157.0 lb

## 2017-11-06 DIAGNOSIS — Z87891 Personal history of nicotine dependence: Secondary | ICD-10-CM | POA: Insufficient documentation

## 2017-11-06 DIAGNOSIS — R0609 Other forms of dyspnea: Secondary | ICD-10-CM | POA: Diagnosis not present

## 2017-11-06 DIAGNOSIS — I10 Essential (primary) hypertension: Secondary | ICD-10-CM | POA: Insufficient documentation

## 2017-11-06 DIAGNOSIS — R5382 Chronic fatigue, unspecified: Secondary | ICD-10-CM | POA: Insufficient documentation

## 2017-11-06 DIAGNOSIS — J449 Chronic obstructive pulmonary disease, unspecified: Secondary | ICD-10-CM

## 2017-11-06 DIAGNOSIS — R5381 Other malaise: Secondary | ICD-10-CM

## 2017-11-06 NOTE — Progress Notes (Signed)
New patient office visit note:  Impression and Recommendations:    1. Chronic obstructive pulmonary disease, unspecified COPD type ([place])   2. Hypertension, unspecified type   3. Stopped smoking 2008 with greater than 40 pack year history   4. Physical deconditioning   5. Chronic fatigue   6. Dyspnea on minimal exertion-likely due to COPD    1. Hypertension - 156/100 at re-check, Dr. Raliegh Scarlet in office in left arm. - Continue amlodipine as prescribed.  - Continue measuring blood pressure at home using monitor.  Check blood pressure once or twice a day and take a blood pressure log.  Sit quietly for at least 20 minutes prior to checking it.  - Reviewed goal blood pressure as 120/80.  - Contact us prior to next OV with any Q's/ concerns.  2. COPD/Pulmonology Specialty Follow-Up - Advised that the patient's care for his lungs and COPD will continue to be handled through pulmonology.  3. Screenings - Recommended that the patient obtain a dilated eye exam to evaluate his visual changes.  4. Hearing Changes (chronic for 30+ years) - Per patient, last had his ears checked 30+ years ago. - Discussed options if he desires assessment for hearing aids in the future.  5. General Health Maintenance - Advised patient to continue working toward exercising to improve health.    - Continue 30 minutes of activity daily on stationary bike.  Recommended that the patient eventually strive for at least 150 minutes of cardio per week according to the [facility].   - Healthy dietary habits encouraged, including low-carb, and high amounts of lean protein in diet.   - Patient should also consume adequate amounts of water - half of body weight in oz of water per day.  6. Follow-Up - Patient will return for fasting blood work at his convenience. - Afterward, he will return for follow-up OV in one month to discuss labs and BP.   Education and routine counseling performed. Handouts  provided.   Gross side effects, risk and benefits, and alternatives of medications discussed with patient.  Patient is aware that all medications have potential side effects and we are unable to predict every side effect or drug-drug interaction that may occur.  Expresses verbal understanding and consents to current therapy plan and treatment regimen.  Return for 4wk f/up BP and come in for FBW 2-3 d prior.  Please see AVS handed out to patient at the end of our visit for further patient instructions/ counseling done pertaining to today's office visit.    Note: This document was prepared using Dragon voice recognition software and may include unintentional dictation errors.     This document serves as a record of services personally performed by [name], DO. It was created on her behalf by [name], a trained medical scribe. The creation of this record is based on the scribe's personal observations and the provider's statements to them.    I have reviewed the above medical documentation for accuracy and completeness and I concur.  [name] 11/15/17 8:21 AM    -------------------------------------------------------------------------------------   Subjective:    Chief complaint:   Chief Complaint  Patient presents with  . Establish Care     HPI: George Frazier is a pleasant 72 y.o. male who presents to [place] [facility] today to review their medical history with me and establish care.   I asked the patient to review their chronic problem list with  me to ensure everything was updated and accurate.    All recent office visits with other providers, any medical records that patient brought in etc  - I reviewed today.     We asked pt to get [place] their medical records from [facility] providers/ specialists that they had seen within the past 3-5 years- if they are in private practice and/or do not work for [employer], [facility], [employer], [facility]  or [employer] owned practice.  Told them to call their specialists to clarify this if they are not sure.    Per Dr. Hart Robinsons Mannam's note on 10/22/2017:    "HPI: 72 year old with history of COPD on home oxygen with recurrent admissions for COPD exacerbation his last admission was on 3/4 when he was admitted with an acute exacerbation in setting of RSV.  He was intubated for 1 day.  Extubated and discharged on Symbicort.  He is finished using the Symbicort but has no refills left.  He is just using the albuterol every 4 hours Reports chronic dyspnea on exertion, cough with white mucus.  Denies any fevers, chills  He has a strong family history of emphysema in with his father and his brothers who are all smokers.  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  Currently retired Exposures: No known exposures, no asbestos, hot tub, mold, Smoking history: 40-pack-year smoking history.  Quit in 2009 Travel History: Not significant"   Here today with his sister-in-law.  He's never had a regular doctor.  He's only really visited urgent cares or clinics for specific issues in the past.  Social History Currently lives with his sister-in-law. After he got sick in January of this year (3 months ago), they recommended that he move in with someone. She notes that she's known him since he was 72 years old.  Has been divorced for about 38-40 years. He has two kids.  Gets along well with his kids.  No grandchildren.  - Tobacco Use Quit smoking in 2009.  Smoked for 40 years prior.   40 pack-year smoking history.  - EtOH Use Patient notes that he does not drink alcohol.  Works out 30 minutes daily on the stationary bike, per recommendations from pulmonology.  Family History Father and brothers all had bad COPD.  Oldest brother may have had a heart attack in his 11's, but sister-in-law isn't sure about this.  No history of  diabetes in the family.  Believes that his mother had a mild stroke at some point.   Past Medical History  Notes no known history of cholesterol or diabetes.  Current medications are amlodipine, Symbicort, and ventolin.  Notes recent change in vision, but hasn't been to an eye doctor in a while.  Isn't sure if he has a diagnosis of CAD or heart failure.  - COPD Follows up with pulmonology.  Notes that he gets weak when he gets up and moves around.  - Hypertension Patient notes that he takes the amlodipine daily. Has been on BP meds since January first of this year (07/2017) or so.  At home, his blood pressure is typically normal around 130-something/70-something. He was checking his blood pressure quite regularly over the past couple months. Lately, he's been feeling good and BP was steady, so he stopped checking it unless he felt symptomatic.    Wt Readings from Last 3 Encounters:  11/06/17 157 lb (71.2 kg)  10/22/17 153 lb (69.4 kg)  09/21/17  168 lb (76.2 kg)   BP Readings from Last 3 Encounters:  11/06/17 (!) 176/103  10/22/17 132/74  09/21/17 (!) 167/98   Pulse Readings from Last 3 Encounters:  11/06/17 (!) 107  10/22/17 95  09/21/17 97   BMI Readings from Last 3 Encounters:  11/06/17 21.29 kg/m  10/22/17 20.75 kg/m  09/21/17 24.11 kg/m    Patient Care Team    Relationship Specialty Notifications Start End  [name], DO PCP - General Family Medicine  10/22/17   Marshell Garfinkel, MD Consulting Physician Pulmonary Disease  11/06/17     Patient Active Problem List   Diagnosis Date Noted  . Hypertension 11/06/2017  . Stopped smoking 2008 with greater than 40 pack year history 11/06/2017  . Chronic fatigue 11/06/2017  . Dyspnea on minimal exertion-likely due to COPD 11/06/2017  . Acute respiratory failure with hypoxia ([place]) 09/17/2017  . CAP (community acquired pneumonia) 07/14/2017  . COPD (chronic obstructive pulmonary disease) ([place]) 07/14/2017   . Heart failure ([place]) 07/14/2017     Past Medical History:  Diagnosis Date  . CHF (congestive heart failure) ([place])   . COPD (chronic obstructive pulmonary disease) ([place])   . Hypertension     Past Medical History:  Diagnosis Date  . CHF (congestive heart failure) ([place])   . COPD (chronic obstructive pulmonary disease) ([place])   . Hypertension     No past surgical history on file.   Family History  Problem Relation Age of Onset  . CAD Mother 69  . Hypertension Mother   . Stroke Mother   . COPD Father 64  . AAA (abdominal aortic aneurysm) Father   . COPD Brother   . COPD Brother   . Cancer Neg Hx      Social History   Substance and Sexual Activity  Drug Use No     Social History   Substance and Sexual Activity  Alcohol Use No  . Frequency: Never     Social History   Tobacco Use  Smoking Status Former Smoker  . Packs/day: 1.00  . Years: 40.00  . Pack years: 40.00  . Last attempt to quit: 2008  . Years since quitting: 11.3  Smokeless Tobacco Former Systems developer     Current Meds  Medication Sig  . albuterol (PROVENTIL HFA;VENTOLIN HFA) 108 (90 Base) MCG/ACT inhaler Inhale 2 puffs into the lungs every 4 (four) hours as needed for wheezing or shortness of breath.  . budesonide-formoterol (SYMBICORT) 160-4.5 MCG/ACT inhaler Inhale 2 puffs into the lungs 2 (two) times daily at 10 AM and 5 PM.  . [DISCONTINUED] amLODipine (NORVASC) 5 MG tablet Take 1 tablet (5 mg total) by mouth daily.    Allergies: Patient has no known allergies.   Review of Systems  Constitutional: Negative for chills, diaphoresis, fever, malaise/fatigue and weight loss.  HENT: Positive for hearing loss (chronic for 30+ years) and tinnitus (chronic). Negative for congestion and sore throat.   Eyes: Negative for blurred vision, double vision and photophobia.  Respiratory: Positive for cough (chronic, COPD) and wheezing (chronic, COPD).        COPD (chronic)  Cardiovascular: Negative for  chest pain and palpitations.  Gastrointestinal: Negative for blood in stool, diarrhea, nausea and vomiting.  Genitourinary: Negative for dysuria, frequency and urgency.  Musculoskeletal: Negative for joint pain and myalgias.  Skin: Negative for itching and rash.  Neurological: Positive for weakness (chronic). Negative for dizziness, focal weakness and headaches.  Endo/Heme/Allergies: Negative for environmental allergies and polydipsia. Does not bruise/bleed  easily.  Psychiatric/Behavioral: Negative for depression and memory loss. The patient is not nervous/anxious and does not have insomnia.      Objective:   Blood pressure (!) 176/103, pulse (!) 107, height 6' (1.829 m), weight 157 lb (71.2 kg), SpO2 95 %. Body mass index is 21.29 kg/m. General: Well Developed, well nourished, and in no acute distress.  Neuro: Alert and oriented x3, extra-ocular muscles intact, sensation grossly intact.  HEENT:[place]/AT, PERRLA, neck supple, No carotid bruits Skin: no gross rashes  Cardiac:  Distant heart sounds, regular rate and rhythm, but no murmur.  Respiratory: On oxygen.  Nasal cannula in place.  Breath sounds are decreased aeration globally, decreased inhalation < exhalation phases of breathing.  Essentially clear to auscultation bilaterally. Not using accessory muscles, slight dyspnea, and not speaking in full sentences.   Abdominal: not grossly distended Musculoskeletal: Ambulates w/o diff, FROM * 4 ext.  Vasc: less 2 sec cap RF, warm and pink  Psych:  No HI/SI, judgement and insight good, Euthymic mood. Full Affect.    Recent Results (from the past 2160 hour(s))  Brain natriuretic peptide     Status: Abnormal   Collection Time: 09/17/17  4:00 PM  Result Value Ref Range   B Natriuretic Peptide 403.4 (H) 0.0 - 100.0 pg/mL    Comment: Performed at [facility], [place] [address], [place], [place] [address]  Culture, blood (Routine X 2) w Reflex to ID Panel     Status: None    Collection Time: 09/17/17  4:00 PM  Result Value Ref Range   Specimen Description      BLOOD BLOOD RIGHT FOREARM Performed at [facility] [address] [place], [place] [address]    Special Requests      BOTTLES DRAWN AEROBIC AND ANAEROBIC Blood Culture adequate volume Performed at [facility] [address] [place], [place] [address]    Culture      NO GROWTH 5 DAYS Performed at [facility], [place] [address], [place],  [address]    Report Status 09/22/2017 FINAL   I-Stat Troponin, ED (not at [facility])     Status: None   Collection Time: 09/17/17  4:02 PM  Result Value Ref Range   Troponin i, poc 0.00 0.00 - 0.08 ng/mL   Comment 3            Comment: Due to the release kinetics of cTnI, a negative result within the first hours of the onset of symptoms does not rule out myocardial infarction with certainty. If myocardial infarction is still suspected, repeat the test at appropriate intervals.   CBC with Differential     Status: Abnormal   Collection Time: 09/17/17  4:03 PM  Result Value Ref Range   WBC 12.3 (H) 4.0 - 10.5 K/uL   RBC 4.80 4.22 - 5.81 MIL/uL   Hemoglobin 14.7 13.0 - 17.0 g/dL   HCT 48.2 39.0 - 52.0 %   MCV 100.4 (H) 78.0 - 100.0 fL   MCH 30.6 26.0 - 34.0 pg   MCHC 30.5 30.0 - 36.0 g/dL   RDW 14.8 11.5 - 15.5 %   Platelets 318 150 - 400 K/uL   Neutrophils Relative % 56 %   Neutro Abs 6.9 1.7 - 7.7 K/uL   Lymphocytes Relative 22 %   Lymphs Abs 2.7 0.7 - 4.0 K/uL   Monocytes Relative 22 %   Monocytes Absolute 2.7 (H) 0.1 - 1.0 K/uL   Eosinophils  Relative 0 %   Eosinophils Absolute 0.0 0.0 - 0.7 K/uL   Basophils Relative 0 %   Basophils Absolute 0.0 0.0 - 0.1 K/uL    Comment: Performed at [facility], [place] [address] [place], [place] [address]  Comprehensive metabolic panel     Status: Abnormal   Collection Time: 09/17/17  4:03 PM  Result Value Ref Range   Sodium 139 135 - 145 mmol/L    Potassium 5.1 3.5 - 5.1 mmol/L   Chloride 97 (L) 101 - 111 mmol/L   CO2 27 22 - 32 mmol/L   Glucose, Bld 198 (H) 65 - 99 mg/dL   BUN 14 6 - 20 mg/dL   Creatinine, Ser 1.22 0.61 - 1.24 mg/dL   Calcium 8.6 (L) 8.9 - 10.3 mg/dL   Total Protein 7.3 6.5 - 8.1 g/dL   Albumin 3.8 3.5 - 5.0 g/dL   AST 33 15 - 41 U/L   ALT 19 17 - 63 U/L   Alkaline Phosphatase 77 38 - 126 U/L   Total Bilirubin 1.8 (H) 0.3 - 1.2 mg/dL   GFR calc non Af Amer 58 (L) >60 mL/min   GFR calc Af Amer >60 >60 mL/min    Comment: (NOTE) The eGFR has been calculated using the CKD EPI equation. This calculation has not been validated in all clinical situations. eGFR's persistently <60 mL/min signify possible Chronic Kidney Disease.    Anion gap 15 5 - 15    Comment: Performed at [facility], [place] [address], [place], [place] [address]  CBG monitoring, ED     Status: Abnormal   Collection Time: 09/17/17  4:03 PM  Result Value Ref Range   Glucose-Capillary 191 (H) 65 - 99 mg/dL  I-Stat CG4 Lactic Acid, ED     Status: Abnormal   Collection Time: 09/17/17  4:04 PM  Result Value Ref Range   Lactic Acid, Venous 7.58 (HH) 0.5 - 1.9 mmol/L   Comment NOTIFIED PHYSICIAN   Blood gas, arterial     Status: Abnormal   Collection Time: 09/17/17  4:14 PM  Result Value Ref Range   FIO2 100.00    Delivery systems VENTILATOR    Mode PRESSURE REGULATED VOLUME CONTROL    VT 550 mL   LHR 15 resp/min   Peep/cpap 5.0 cm H20   pH, Arterial 7.141 (LL) 7.350 - 7.450    Comment: RBV DR.YELVERTON,MD BY LISA CRADDOCK,RRT,RCP ON 09/17/17 AT 1630    pCO2 arterial 82.2 (HH) 32.0 - 48.0 mmHg    Comment: RBV DR.YELVERTON,MD BY LISA CRADDOCK,RRT,RCP ON 09/17/17 AT 1630    pO2, Arterial 400 (H) 83.0 - 108.0 mmHg   Bicarbonate 26.9 20.0 - 28.0 mmol/L   Acid-base deficit 4.2 (H) 0.0 - 2.0 mmol/L   O2 Saturation 99.1 %   Patient temperature 98.6    Collection site BRACHIAL ARTERY    Drawn by [id number]    Sample type  ARTERIAL DRAW    Allens test (pass/fail) PASS PASS    Comment: Performed at [facility], [place] [address] [place], [place] [address]  Culture, blood (Routine X 2) w Reflex to ID Panel     Status: None   Collection Time: 09/17/17  4:50 PM  Result Value Ref Range   Specimen Description      BLOOD BLOOD LEFT FOREARM Performed at [facility] [address], [place], [place] [address]    Special Requests      IN  PEDIATRIC BOTTLE Blood Culture adequate volume Performed at [facility] [address], [place], [place] [address]    Culture      NO GROWTH 5 DAYS Performed at [facility], [place] [address], [place], [place] [address]    Report Status 09/22/2017 FINAL   Blood gas, arterial     Status: Abnormal   Collection Time: 09/17/17  6:24 PM  Result Value Ref Range   FIO2 50.00    Delivery systems VENTILATOR    Mode PRESSURE REGULATED VOLUME CONTROL    VT 550 mL   Peep/cpap 5.0 cm H20   pH, Arterial 7.330 (L) 7.350 - 7.450   pCO2 arterial 55.4 (H) 32.0 - 48.0 mmHg   pO2, Arterial 97.3 83.0 - 108.0 mmHg   Bicarbonate 28.4 (H) 20.0 - 28.0 mmol/L   Acid-Base Excess 2.0 0.0 - 2.0 mmol/L   O2 Saturation 97.3 %   Patient temperature 98.6    Collection site BRACHIAL ARTERY    Drawn by [id number]    Sample type ARTERIAL DRAW    Allens test (pass/fail) PASS PASS    Comment: Performed at [facility], [place] [address], [place], [place] [address]  Urinalysis, Routine w reflex microscopic     Status: Abnormal   Collection Time: 09/17/17  6:30 PM  Result Value Ref Range   Color, Urine YELLOW YELLOW   APPearance CLEAR CLEAR   Specific Gravity, Urine 1.023 1.005 - 1.030   pH 5.0 5.0 - 8.0   Glucose, UA 50 (A) NEGATIVE mg/dL   Hgb urine dipstick NEGATIVE NEGATIVE   Bilirubin Urine NEGATIVE NEGATIVE   Ketones, ur NEGATIVE NEGATIVE mg/dL   Protein, ur 100 (A) NEGATIVE mg/dL   Nitrite NEGATIVE NEGATIVE    Leukocytes, UA NEGATIVE NEGATIVE   RBC / HPF 0-5 0 - 5 RBC/hpf   WBC, UA 0-5 0 - 5 WBC/hpf   Bacteria, UA RARE (A) NONE SEEN   Squamous Epithelial / LPF NONE SEEN NONE SEEN   Mucus PRESENT     Comment: Performed at [facility], [place] [address] [place], [place] [address]  Influenza panel by PCR (type A & B)     Status: None   Collection Time: 09/17/17  6:30 PM  Result Value Ref Range   Influenza A By PCR NEGATIVE NEGATIVE   Influenza B By PCR NEGATIVE NEGATIVE    Comment: (NOTE) The Xpert Xpress Flu assay is intended as an aid in the diagnosis of  influenza and should not be used as a sole basis for treatment.  This  assay is FDA approved for nasopharyngeal swab specimens only. Nasal  washings and aspirates are unacceptable for Xpert Xpress Flu testing. Performed at [facility], [place] [address], [place], [place] [address]   Respiratory Panel by PCR     Status: Abnormal   Collection Time: 09/17/17  6:30 PM  Result Value Ref Range   Adenovirus NOT DETECTED NOT DETECTED   Coronavirus 229E NOT DETECTED NOT DETECTED   Coronavirus HKU1 NOT DETECTED NOT DETECTED   Coronavirus NL63 NOT DETECTED NOT DETECTED   Coronavirus OC43 NOT DETECTED NOT DETECTED   Metapneumovirus NOT DETECTED NOT DETECTED   Rhinovirus / Enterovirus NOT DETECTED NOT DETECTED   Influenza A NOT DETECTED NOT DETECTED   Influenza A H1 NOT DETECTED NOT DETECTED   Influenza A H1 2009 NOT DETECTED NOT DETECTED   Influenza A H3 NOT DETECTED NOT DETECTED   Influenza B  NOT DETECTED NOT DETECTED   Parainfluenza Virus 1 NOT DETECTED NOT DETECTED   Parainfluenza Virus 2 NOT DETECTED NOT DETECTED   Parainfluenza Virus 3 NOT DETECTED NOT DETECTED   Parainfluenza Virus 4 NOT DETECTED NOT DETECTED   Respiratory Syncytial Virus DETECTED (A) NOT DETECTED    Comment: CRITICAL RESULT CALLED TO, READ BACK BY AND VERIFIED WITH: B SCOTT RN 09/17/17 2337 JDW    Bordetella pertussis NOT DETECTED NOT  DETECTED   Chlamydophila pneumoniae NOT DETECTED NOT DETECTED   Mycoplasma pneumoniae NOT DETECTED NOT DETECTED    Comment: Performed at [facility], [place] [address], [place], [place] [address]  I-Stat CG4 Lactic Acid, ED     Status: Abnormal   Collection Time: 09/17/17  6:48 PM  Result Value Ref Range   Lactic Acid, Venous 5.83 (HH) 0.5 - 1.9 mmol/L   Comment NOTIFIED PHYSICIAN   Lactic acid, plasma     Status: Abnormal   Collection Time: 09/17/17  9:18 PM  Result Value Ref Range   Lactic Acid, Venous 4.0 (HH) 0.5 - 1.9 mmol/L    Comment: CRITICAL RESULT CALLED TO, READ BACK BY AND VERIFIED WITHAbelino Derrick RN 2205 09/17/17 A NAVARRO Performed at [facility], [place] [address], [place], [place] [address]   Lactic acid, plasma     Status: Abnormal   Collection Time: 09/17/17 11:09 PM  Result Value Ref Range   Lactic Acid, Venous 2.7 (HH) 0.5 - 1.9 mmol/L    Comment: CRITICAL RESULT CALLED TO, READ BACK BY AND VERIFIED WITHAbelino Derrick RN 2350 09/17/17 A NAVARRO Performed at [facility], [place] [address], [place], [place] [address]   Mycoplasma pneumoniae antibody, IgM     Status: None   Collection Time: 09/17/17 11:09 PM  Result Value Ref Range   Mycoplasma pneumo IgM <770 0 - 769 U/mL    Comment: (NOTE)                             Negative            <770 Clinically significant amount of M. pneumoniae antibody not detected.                             Low Positive   770 - 76 M. pneumoniae specific IgM presumptively detected.  It is recommended that another sample be collected 1-2 weeks later to assure reactivity.                             Positive            >950 Highly significant amount of M. pneumoniae specific IgM antibody detected. Performed At: [facility] [place], [place] [id number] Rush Farmer MD [id number] Performed at [facility], [place] [address] [place], [place] [address]     Legionella Pneumophila Serogp 1 Ur Ag     Status: None   Collection Time: 09/17/17 11:21 PM  Result Value Ref Range   L. pneumophila Serogp 1 Ur Ag Negative Negative    Comment: (NOTE) Presumptive negative for L. pneumophila serogroup 1 antigen in urine, suggesting no recent or current infection. Legionnaires' disease cannot be ruled out since other serogroups and species may also cause disease. Performed At: [facility] [address] [place], [place] [id number] Perlie Gold  Derinda Late MD [id number] Performed at [facility], [place] [address] [place], [place] [address]   Glucose, capillary     Status: Abnormal   Collection Time: 09/17/17 11:22 PM  Result Value Ref Range   Glucose-Capillary 178 (H) 65 - 99 mg/dL   Comment 1 Notify RN    Comment 2 Document in Chart   Glucose, capillary     Status: Abnormal   Collection Time: 09/18/17  3:12 AM  Result Value Ref Range   Glucose-Capillary 218 (H) 65 - 99 mg/dL   Comment 1 Notify RN    Comment 2 Document in Chart   CBC     Status: Abnormal   Collection Time: 09/18/17  3:31 AM  Result Value Ref Range   WBC 7.5 4.0 - 10.5 K/uL   RBC 4.22 4.22 - 5.81 MIL/uL   Hemoglobin 12.4 (L) 13.0 - 17.0 g/dL   HCT 40.8 39.0 - 52.0 %   MCV 96.7 78.0 - 100.0 fL   MCH 29.4 26.0 - 34.0 pg   MCHC 30.4 30.0 - 36.0 g/dL   RDW 14.7 11.5 - 15.5 %   Platelets 303 150 - 400 K/uL    Comment: Performed at [facility], [place] [address], [place], [place] [address]  Basic metabolic panel     Status: Abnormal   Collection Time: 09/18/17  3:31 AM  Result Value Ref Range   Sodium 137 135 - 145 mmol/L   Potassium 3.8 3.5 - 5.1 mmol/L    Comment: DELTA CHECK NOTED REPEATED TO VERIFY    Chloride 99 (L) 101 - 111 mmol/L   CO2 26 22 - 32 mmol/L   Glucose, Bld 223 (H) 65 - 99 mg/dL   BUN 19 6 - 20 mg/dL   Creatinine, Ser 1.48 (H) 0.61 - 1.24 mg/dL   Calcium 8.1 (L) 8.9 - 10.3 mg/dL   GFR calc non Af Amer 46 (L) >60  mL/min   GFR calc Af Amer 53 (L) >60 mL/min    Comment: (NOTE) The eGFR has been calculated using the CKD EPI equation. This calculation has not been validated in all clinical situations. eGFR's persistently <60 mL/min signify possible Chronic Kidney Disease.    Anion gap 12 5 - 15    Comment: Performed at [facility], [place] [address], [place], [place] [address]  Magnesium     Status: None   Collection Time: 09/18/17  3:31 AM  Result Value Ref Range   Magnesium 1.8 1.7 - 2.4 mg/dL    Comment: Performed at [facility], [place] [address] [place], [place] [address]  Phosphorus     Status: Abnormal   Collection Time: 09/18/17  3:31 AM  Result Value Ref Range   Phosphorus <1.0 (LL) 2.5 - 4.6 mg/dL    Comment: CRITICAL RESULT CALLED TO, READ BACK BY AND VERIFIED WITHAbelino Derrick RN 0430 09/18/17 A NAVARRO Performed at [facility], [place] [address] [place],  [address]   Hepatic function panel     Status: Abnormal   Collection Time: 09/18/17  3:31 AM  Result Value Ref Range   Total Protein 6.0 (L) 6.5 - 8.1 g/dL   Albumin 3.0 (L) 3.5 - 5.0 g/dL   AST 53 (H) 15 - 41 U/L   ALT 48 17 - 63 U/L   Alkaline Phosphatase 57 38 - 126 U/L   Total Bilirubin 0.9 0.3 - 1.2 mg/dL   Bilirubin, Direct 0.2 0.1 -  0.5 mg/dL   Indirect Bilirubin 0.7 0.3 - 0.9 mg/dL    Comment: Performed at [facility], [place] [address] [place], [place] [address]  Blood gas, arterial     Status: Abnormal   Collection Time: 09/18/17  4:03 AM  Result Value Ref Range   FIO2 40.00    Delivery systems VENTILATOR    Mode PRESSURE REGULATED VOLUME CONTROL    VT 550 mL   LHR 20 resp/min   Peep/cpap 5.0 cm H20   pH, Arterial 7.365 7.350 - 7.450   pCO2 arterial 53.4 (H) 32.0 - 48.0 mmHg   pO2, Arterial 85.7 83.0 - 108.0 mmHg   Bicarbonate 29.4 (H) 20.0 - 28.0 mmol/L   Acid-Base Excess 3.8 (H) 0.0 - 2.0 mmol/L   O2 Saturation 96.0 %   Patient  temperature 37.9    Collection site RIGHT RADIAL    Drawn by [id number]    Sample type ARTERIAL    Allens test (pass/fail) PASS PASS    Comment: Performed at [facility], [place] [address] [place], [place] [address]  Glucose, capillary     Status: Abnormal   Collection Time: 09/18/17  7:20 AM  Result Value Ref Range   Glucose-Capillary 189 (H) 65 - 99 mg/dL   Comment 1 Notify RN    Comment 2 Document in Chart   Glucose, capillary     Status: Abnormal   Collection Time: 09/18/17 11:37 AM  Result Value Ref Range   Glucose-Capillary 142 (H) 65 - 99 mg/dL   Comment 1 Notify RN    Comment 2 Document in Chart   Glucose, capillary     Status: Abnormal   Collection Time: 09/18/17  4:55 PM  Result Value Ref Range   Glucose-Capillary 179 (H) 65 - 99 mg/dL   Comment 1 Notify RN    Comment 2 Document in Chart   Glucose, capillary     Status: Abnormal   Collection Time: 09/18/17  9:38 PM  Result Value Ref Range   Glucose-Capillary 148 (H) 65 - 99 mg/dL   Comment 1 Notify RN   CBC     Status: Abnormal   Collection Time: 09/19/17  3:12 AM  Result Value Ref Range   WBC 8.0 4.0 - 10.5 K/uL   RBC 3.82 (L) 4.22 - 5.81 MIL/uL   Hemoglobin 11.2 (L) 13.0 - 17.0 g/dL   HCT 37.5 (L) 39.0 - 52.0 %   MCV 98.2 78.0 - 100.0 fL   MCH 29.3 26.0 - 34.0 pg   MCHC 29.9 (L) 30.0 - 36.0 g/dL   RDW 15.0 11.5 - 15.5 %   Platelets 282 150 - 400 K/uL    Comment: Performed at [facility], [place] [address] [place], [place] [address]  Basic metabolic panel     Status: Abnormal   Collection Time: 09/19/17  3:12 AM  Result Value Ref Range   Sodium 143 135 - 145 mmol/L   Potassium 3.7 3.5 - 5.1 mmol/L   Chloride 105 101 - 111 mmol/L   CO2 32 22 - 32 mmol/L   Glucose, Bld 151 (H) 65 - 99 mg/dL   BUN 27 (H) 6 - 20 mg/dL   Creatinine, Ser 1.46 (H) 0.61 - 1.24 mg/dL   Calcium 8.1 (L) 8.9 - 10.3 mg/dL   GFR calc non Af Amer 47 (L) >60 mL/min   GFR calc Af Amer 54 (L) >60 mL/min      Comment: (NOTE) The eGFR  has been calculated using the CKD EPI equation. This calculation has not been validated in all clinical situations. eGFR's persistently <60 mL/min signify possible Chronic Kidney Disease.    Anion gap 6 5 - 15    Comment: Performed at [facility], [place] [address] [place], [place] [address]  Magnesium     Status: None   Collection Time: 09/19/17  3:12 AM  Result Value Ref Range   Magnesium 2.4 1.7 - 2.4 mg/dL    Comment: Performed at [facility], [place] [address] [place], [place] [address]  Phosphorus     Status: None   Collection Time: 09/19/17  3:12 AM  Result Value Ref Range   Phosphorus 3.0 2.5 - 4.6 mg/dL    Comment: Performed at [facility], [place] [address] [place], [place] [address]  Glucose, capillary     Status: Abnormal   Collection Time: 09/19/17  7:33 AM  Result Value Ref Range   Glucose-Capillary 138 (H) 65 - 99 mg/dL  MRSA PCR Screening     Status: None   Collection Time: 09/19/17  9:12 AM  Result Value Ref Range   MRSA by PCR NEGATIVE NEGATIVE    Comment:        The GeneXpert MRSA Assay (FDA approved for NASAL specimens only), is one component of a comprehensive MRSA colonization surveillance program. It is not intended to diagnose MRSA infection nor to guide or monitor treatment for MRSA infections. Performed at [facility], [place] [address] [place], [place] [address]   Glucose, capillary     Status: Abnormal   Collection Time: 09/19/17 11:07 AM  Result Value Ref Range   Glucose-Capillary 162 (H) 65 - 99 mg/dL  Glucose, capillary     Status: Abnormal   Collection Time: 09/19/17  4:37 PM  Result Value Ref Range   Glucose-Capillary 178 (H) 65 - 99 mg/dL  Glucose, capillary     Status: Abnormal   Collection Time: 09/19/17  9:12 PM  Result Value Ref Range   Glucose-Capillary 208 (H) 65 - 99 mg/dL  CBC     Status: Abnormal   Collection Time: 09/20/17   6:05 AM  Result Value Ref Range   WBC 7.7 4.0 - 10.5 K/uL   RBC 3.78 (L) 4.22 - 5.81 MIL/uL   Hemoglobin 11.1 (L) 13.0 - 17.0 g/dL   HCT 37.0 (L) 39.0 - 52.0 %   MCV 97.9 78.0 - 100.0 fL   MCH 29.4 26.0 - 34.0 pg   MCHC 30.0 30.0 - 36.0 g/dL   RDW 15.1 11.5 - 15.5 %   Platelets 324 150 - 400 K/uL    Comment: Performed at [facility], [place] [address], [place],  [address]  Basic metabolic panel     Status: Abnormal   Collection Time: 09/20/17  6:05 AM  Result Value Ref Range   Sodium 146 (H) 135 - 145 mmol/L   Potassium 4.1 3.5 - 5.1 mmol/L   Chloride 107 101 - 111 mmol/L   CO2 31 22 - 32 mmol/L   Glucose, Bld 144 (H) 65 - 99 mg/dL   BUN 31 (H) 6 - 20 mg/dL   Creatinine, Ser 1.24 0.61 - 1.24 mg/dL   Calcium 8.3 (L) 8.9 - 10.3 mg/dL   GFR calc non Af Amer 57 (L) >60 mL/min   GFR calc Af Amer >60 >60 mL/min    Comment: (NOTE) The eGFR has been calculated using the CKD  EPI equation. This calculation has not been validated in all clinical situations. eGFR's persistently <60 mL/min signify possible Chronic Kidney Disease.    Anion gap 8 5 - 15    Comment: Performed at [facility], [place] [address] [place], [place] [address]  Phosphorus     Status: None   Collection Time: 09/20/17  6:05 AM  Result Value Ref Range   Phosphorus 2.8 2.5 - 4.6 mg/dL    Comment: Performed at [facility], [place] [address], [place], [place] [address]  Glucose, capillary     Status: Abnormal   Collection Time: 09/20/17  7:21 AM  Result Value Ref Range   Glucose-Capillary 140 (H) 65 - 99 mg/dL  Glucose, capillary     Status: Abnormal   Collection Time: 09/20/17 11:09 AM  Result Value Ref Range   Glucose-Capillary 235 (H) 65 - 99 mg/dL  Glucose, capillary     Status: Abnormal   Collection Time: 09/20/17  4:29 PM  Result Value Ref Range   Glucose-Capillary 103 (H) 65 - 99 mg/dL  Glucose, capillary     Status: Abnormal   Collection Time:  09/20/17  9:02 PM  Result Value Ref Range   Glucose-Capillary 173 (H) 65 - 99 mg/dL  Glucose, capillary     Status: Abnormal   Collection Time: 09/21/17  7:23 AM  Result Value Ref Range   Glucose-Capillary 126 (H) 65 - 99 mg/dL  Glucose, capillary     Status: Abnormal   Collection Time: 09/21/17 11:21 AM  Result Value Ref Range   Glucose-Capillary 163 (H) 65 - 99 mg/dL  Alpha-1 antitrypsin phenotype     Status: Abnormal   Collection Time: 10/22/17  2:06 PM  Result Value Ref Range   A-1 Antitrypsin, Ser 235 (H) 83 - 199 mg/dL   ALPHA-1-ANTITRYPSIN (AAT) PHENOTYPE SEE NOTE     Comment: THIS PATIENT'S ALPHA-1-ANTITRYPSIN 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 Marland Kitchen 90% of normal individuals have the MM phenotype, with normal quantitative AAT levels. Many phenotypic patterns have been described, including deficiency states with F, S, Z, or other alleles. As a general estimation, compared to M allele of 100% of normal A-1-Antitrypsin protein, the S allele produces approximately 60% and the Z allele 20%. For example, an MS phenotype would have about 80% of normal A-1-Antitrypsin protein level, a 50% contribution from the M allele and 30% from the S allele. A ZZ phenotype would have about 20% of normal levels, a 10% contribution from each Z gene. The F allele has normal A-1-Antitrypsin levels, but the kinetics of elastase inhibition is not as efficient as an M allele product; F alleles should be considered functionally mildly deficient. Other variants are identifiable by phenotypic analysis. These include CM, DP, EM, GM, IS, LM, M1M2, M3M3, MP, MT, XX, MY, and M1N. I, P, T and  null alleles are considered deleterious. C, D, E, G, L, M1, M2, M3, X and Y alleles are generally considered normal variants. The MZ-Pratt phenotype is a normal variant; care should be taken to avoid confusion with the deficient MZ phenotype.   CBC w/Diff     Status: Abnormal   Collection Time: 10/22/17  2:06 PM  Result Value Ref  Range   WBC 11.3 (H) 4.0 - 10.5 K/uL   RBC 4.40 4.22 - 5.81 Mil/uL   Hemoglobin 13.0 13.0 - 17.0 g/dL   HCT 40.1 39.0 - 52.0 %   MCV 91.2 78.0 - 100.0 fl   MCHC 32.5 30.0 - 36.0  g/dL   RDW 14.8 11.5 - 15.5 %   Platelets 535.0 (H) 150.0 - 400.0 K/uL   Neutrophils Relative % 64.3 43.0 - 77.0 %   Lymphocytes Relative 22.5 12.0 - 46.0 %   Monocytes Relative 10.0 3.0 - 12.0 %   Eosinophils Relative 2.2 0.0 - 5.0 %   Basophils Relative 1.0 0.0 - 3.0 %   Neutro Abs 7.3 1.4 - 7.7 K/uL   Lymphs Abs 2.5 0.7 - 4.0 K/uL   Monocytes Absolute 1.1 (H) 0.1 - 1.0 K/uL   Eosinophils Absolute 0.2 0.0 - 0.7 K/uL   Basophils Absolute 0.1 0.0 - 0.1 K/uL

## 2017-11-06 NOTE — Patient Instructions (Addendum)
We will get a full set of labs including TSH and T4 in near future 2 to 3 days prior to your follow-up office visit with me in 1 month.  -As mentioned you will continue to get your COPD and breathing medicines from your pulmonologist and we will handle blood pressure and anything else that may come up    Please realize, EXERCISE IS MEDICINE!  -  American Heart Association [facility]) guidelines for exercise : If you are in good health, without any medical conditions, you should engage in 150 minutes of moderate intensity aerobic activity per week.  This means you should be huffing and puffing throughout your workout.   Engaging in regular exercise will improve brain function and memory, as well as improve mood, boost immune system and help with weight management.  As well as the other, more well-known effects of exercise such as decreasing blood sugar levels, decreasing blood pressure,  and decreasing bad cholesterol levels/ increasing good cholesterol levels.     -  The AHA strongly endorses consumption of a diet that contains a variety of foods from all the food categories with an emphasis on fruits and vegetables; fat-free and low-fat dairy products; cereal and grain products; legumes and nuts; and fish, poultry, and/or extra lean meats.    Excessive food intake, especially of foods high in saturated and trans fats, sugar, and salt, should be avoided.    Adequate water intake of roughly 1/2 of your weight in pounds, should equal the ounces of water per day you should drink.  So for instance, if you're 200 pounds, that would be 100 ounces of water per day.         Mediterranean Diet  Why follow it? Research shows. . Those who follow the Mediterranean diet have a reduced risk of heart disease  . The diet is associated with a reduced incidence of Parkinson's and Alzheimer's diseases . People following the diet may have longer life expectancies and lower rates of chronic diseases  . The Dietary  Guidelines for Americans recommends the Mediterranean diet as an eating plan to promote health and prevent disease  What Is the Mediterranean Diet?  . Healthy eating plan based on typical foods and recipes of Mediterranean-style cooking . The diet is primarily a plant based diet; these foods should make up a majority of meals   Starches - Plant based foods should make up a majority of meals - They are an important sources of vitamins, minerals, energy, antioxidants, and fiber - Choose whole grains, foods high in fiber and minimally processed items  - Typical grain sources include wheat, oats, barley, corn, [place] rice, bulgar, farro, millet, polenta, couscous  - Various types of beans include chickpeas, lentils, fava beans, black beans, white beans   Fruits  Veggies - Large quantities of antioxidant rich fruits & veggies; 6 or more servings  - Vegetables can be eaten raw or lightly drizzled with oil and cooked  - Vegetables common to the traditional Mediterranean Diet include: artichokes, arugula, beets, broccoli, brussel sprouts, cabbage, carrots, celery, collard greens, cucumbers, eggplant, kale, leeks, lemons, lettuce, mushrooms, okra, onions, peas, peppers, potatoes, pumpkin, radishes, rutabaga, shallots, spinach, sweet potatoes, turnips, zucchini - Fruits common to the Mediterranean Diet include: apples, apricots, avocados, cherries, clementines, dates, figs, grapefruits, grapes, melons, nectarines, oranges, peaches, pears, pomegranates, strawberries, tangerines  Fats - Replace butter and margarine with healthy oils, such as olive oil, canola oil, and tahini  - Limit nuts to no more than  a handful a day  - Nuts include walnuts, almonds, pecans, pistachios, pine nuts  - Limit or avoid candied, honey roasted or heavily salted nuts - Olives are central to the Mediterranean diet - can be eaten whole or used in a variety of dishes   Meats Protein - Limiting red meat: no more than a few times a  month - When eating red meat: choose lean cuts and keep the portion to the size of deck of cards - Eggs: approx. 0 to 4 times a week  - Fish and lean poultry: at least 2 a week  - Healthy protein sources include, chicken, [place], lean beef, lamb - Increase intake of seafood such as tuna, salmon, trout, mackerel, shrimp, scallops - Avoid or limit high fat processed meats such as sausage and bacon  Dairy - Include moderate amounts of low fat dairy products  - Focus on healthy dairy such as fat free yogurt, skim milk, low or reduced fat cheese - Limit dairy products higher in fat such as whole or 2% milk, cheese, ice cream  Alcohol - Moderate amounts of red wine is ok  - No more than 5 oz daily for women (all ages) and men older than age 70  - No more than 10 oz of wine daily for men younger than 29  Other - Limit sweets and other desserts  - Use herbs and spices instead of salt to flavor foods  - Herbs and spices common to the traditional Mediterranean Diet include: basil, bay leaves, chives, cloves, cumin, fennel, garlic, lavender, marjoram, mint, oregano, parsley, pepper, rosemary, sage, savory, sumac, tarragon, thyme   It's not just a diet, it's a lifestyle:  . The Mediterranean diet includes lifestyle factors typical of those in the region  . Foods, drinks and meals are best eaten with others and savored . Daily physical activity is important for overall good health . This could be strenuous exercise like running and aerobics . This could also be more leisurely activities such as walking, housework, yard-work, or taking the stairs . Moderation is the key; a balanced and healthy diet accommodates most foods and drinks . Consider portion sizes and frequency of consumption of certain foods   Meal Ideas & Options:  . Breakfast:  o Whole wheat toast or whole wheat English muffins with peanut butter & hard boiled egg o Steel cut oats topped with apples & cinnamon and skim milk  o Fresh  fruit: banana, strawberries, melon, berries, peaches  o Smoothies: strawberries, bananas, greek yogurt, peanut butter o Low fat greek yogurt with blueberries and granola  o Egg white omelet with spinach and mushrooms o Breakfast couscous: whole wheat couscous, apricots, skim milk, cranberries  . Sandwiches:  o Hummus and grilled vegetables (peppers, zucchini, squash) on whole wheat bread   o Grilled chicken on whole wheat pita with lettuce, tomatoes, cucumbers or tzatziki  o Tuna salad on whole wheat bread: tuna salad made with greek yogurt, olives, red peppers, capers, green onions o Garlic rosemary lamb pita: lamb sauted with garlic, rosemary, salt & pepper; add lettuce, cucumber, greek yogurt to pita - flavor with lemon juice and black pepper  . Seafood:  o Mediterranean grilled salmon, seasoned with garlic, basil, parsley, lemon juice and black pepper o Shrimp, lemon, and spinach whole-grain pasta salad made with low fat greek yogurt  o Seared scallops with lemon orzo  o Seared tuna steaks seasoned salt, pepper, coriander topped with tomato mixture of olives, tomatoes, olive oil,  minced garlic, parsley, green onions and cappers  . Meats:  o Herbed greek chicken salad with kalamata olives, cucumber, feta  o Red bell peppers stuffed with spinach, bulgur, lean ground beef (or lentils) & topped with feta   o Kebabs: skewers of chicken, tomatoes, onions, zucchini, squash  o [place] burgers: made with red onions, mint, dill, lemon juice, feta cheese topped with roasted red peppers . Vegetarian o Cucumber salad: cucumbers, artichoke hearts, celery, red onion, feta cheese, tossed in olive oil & lemon juice  o Hummus and whole grain pita points with a greek salad (lettuce, tomato, feta, olives, cucumbers, red onion) o Lentil soup with celery, carrots made with vegetable broth, garlic, salt and pepper  o Tabouli salad: parsley, bulgur, mint, scallions, cucumbers, tomato, radishes, lemon juice,  olive oil, salt and pepper.

## 2017-11-08 ENCOUNTER — Telehealth: Payer: Self-pay | Admitting: Family Medicine

## 2017-11-08 NOTE — Telephone Encounter (Signed)
Pt's wife called stats this medicine was gvn to Patient by Hospital doctor, she was unsure if Dr. Raliegh Scarlet wanted him to continue or not-- Patient has only 10 pills left.   Patient needs refill if " Dr. Jenetta Downer" wants him to continue taking.  amLODipine (NORVASC) 5 MG tablet [[id number]]   Order Details  Dose: 5 mg Route: Oral Frequency: Daily  Dispense Quantity: 30 tablet Refills: 0 Fills remaining: --        Sig: Take 1 tablet (5 mg total) by mouth daily.       ------------ Please send small Refill script to : [facility] .    ----- Please send main Rx refill to:  Preferred Pharmacies      [place], [place] - [place] [phone] (Phone) [phone] (Fax)   ---glh

## 2017-11-12 ENCOUNTER — Other Ambulatory Visit: Payer: Self-pay

## 2017-11-12 MED ORDER — AMLODIPINE BESYLATE 5 MG PO TABS: 5.0000 mg | ORAL_TABLET | Freq: Every day | ORAL | 0 refills | Status: DC

## 2017-11-12 NOTE — Telephone Encounter (Signed)
Refill request sent to Dr. Raliegh Scarlet to review. MPulliam, CMA/RT(R)

## 2017-11-12 NOTE — Telephone Encounter (Signed)
Patient requesting refill on Amlodipine, patient was placed on medication by provider that saw him in the hospital on 09/22/17. Patient was seen by D. Opalski as new patient on 11/06/17. Request sent to Dr. Raliegh Scarlet to review. MPulliam, CMA/RT(R)

## 2017-11-13 ENCOUNTER — Other Ambulatory Visit: Payer: Self-pay

## 2017-11-13 MED ORDER — AMLODIPINE BESYLATE 5 MG PO TABS: 5.0000 mg | ORAL_TABLET | Freq: Every day | ORAL | 0 refills | Status: DC

## 2017-11-15 DIAGNOSIS — R5381 Other malaise: Secondary | ICD-10-CM | POA: Insufficient documentation

## 2017-11-16 ENCOUNTER — Other Ambulatory Visit: Payer: Self-pay

## 2017-11-16 DIAGNOSIS — R5381 Other malaise: Secondary | ICD-10-CM

## 2017-11-16 DIAGNOSIS — R5382 Chronic fatigue, unspecified: Secondary | ICD-10-CM

## 2017-11-16 DIAGNOSIS — J9601 Acute respiratory failure with hypoxia: Secondary | ICD-10-CM

## 2017-11-16 DIAGNOSIS — I509 Heart failure, unspecified: Secondary | ICD-10-CM

## 2017-11-16 DIAGNOSIS — I10 Essential (primary) hypertension: Secondary | ICD-10-CM

## 2017-11-16 DIAGNOSIS — J439 Emphysema, unspecified: Secondary | ICD-10-CM

## 2017-11-16 NOTE — Progress Notes (Signed)
Future lab orders. MPulliam, CMA/RT(R)

## 2017-11-22 ENCOUNTER — Other Ambulatory Visit: Payer: Self-pay | Admitting: Pulmonary Disease

## 2017-11-22 DIAGNOSIS — J439 Emphysema, unspecified: Secondary | ICD-10-CM

## 2017-11-23 ENCOUNTER — Ambulatory Visit (INDEPENDENT_AMBULATORY_CARE_PROVIDER_SITE_OTHER): Payer: Medicare Other | Admitting: Pulmonary Disease

## 2017-11-23 ENCOUNTER — Encounter: Payer: Self-pay | Admitting: Pulmonary Disease

## 2017-11-23 VITALS — BP 160/90 | HR 115 | Ht 72.0 in | Wt 159.0 lb

## 2017-11-23 DIAGNOSIS — J439 Emphysema, unspecified: Secondary | ICD-10-CM

## 2017-11-23 DIAGNOSIS — J449 Chronic obstructive pulmonary disease, unspecified: Secondary | ICD-10-CM | POA: Diagnosis not present

## 2017-11-23 LAB — PULMONARY FUNCTION TEST
DL/VA % PRED: 45 %
DL/VA: 2.08 ml/min/mmHg/L
DLCO UNC: 9.98 ml/min/mmHg
DLCO unc % pred: 30 %
FEF 25-75 POST: 0.35 L/s
FEF 25-75 Pre: 0.34 L/sec
FEF2575-%Change-Post: 1 %
FEF2575-%PRED-POST: 14 %
FEF2575-%PRED-PRE: 14 %
FEV1-%Change-Post: 0 %
FEV1-%Pred-Post: 24 %
FEV1-%Pred-Pre: 24 %
FEV1-Post: 0.78 L
FEV1-Pre: 0.78 L
FEV1FVC-%Change-Post: -4 %
FEV1FVC-%PRED-PRE: 51 %
FEV6-%Change-Post: 1 %
FEV6-%Pred-Post: 48 %
FEV6-%Pred-Pre: 47 %
FEV6-POST: 1.99 L
FEV6-Pre: 1.97 L
FEV6FVC-%CHANGE-POST: -3 %
FEV6FVC-%Pred-Post: 98 %
FEV6FVC-%Pred-Pre: 101 %
FVC-%Change-Post: 4 %
FVC-%PRED-POST: 49 %
FVC-%PRED-PRE: 47 %
FVC-POST: 2.16 L
FVC-Pre: 2.06 L
POST FEV1/FVC RATIO: 36 %
PRE FEV1/FVC RATIO: 38 %
PRE FEV6/FVC RATIO: 96 %
Post FEV6/FVC ratio: 92 %
RV % pred: 120 %
RV: 3.01 L
TLC % PRED: 94 %
TLC: 6.66 L

## 2017-11-23 MED ORDER — FUROSEMIDE 20 MG PO TABS: 20.0000 mg | ORAL_TABLET | Freq: Every day | ORAL | 3 refills | Status: DC

## 2017-11-23 MED ORDER — FLUTICASONE-UMECLIDIN-VILANT 100-62.5-25 MCG/INH IN AEPB: 1.0000 | INHALATION_SPRAY | Freq: Every day | RESPIRATORY_TRACT | 0 refills | Status: AC

## 2017-11-23 MED ORDER — FLUTICASONE-UMECLIDIN-VILANT 100-62.5-25 MCG/INH IN AEPB: 1.0000 | INHALATION_SPRAY | Freq: Every day | RESPIRATORY_TRACT | 5 refills | Status: DC

## 2017-11-23 NOTE — Progress Notes (Signed)
George Frazier    [id number]    1946/03/12  Primary Care Physician:Opalski, Neoma Laming, DO  Referring Physician: [name], [address] [place], [place] [address]  Chief complaint:  Follow-up for COPD Deanna Artis  HPI: 72 year old with history of COPD GOLD D (CAT score 16., multiple exacerbations) on home oxygen with recurrent admissions for COPD exacerbation his last admission was on 3/4 when he was admitted with an acute exacerbation in setting of RSV.  He was intubated for 1 day.  Extubated and discharged on Symbicort.  He is finished using the Symbicort but has no refills left.  He is just using the albuterol every 4 hours Reports chronic dyspnea on exertion, cough with white mucus.  Denies any fevers, chills  He has a strong family history of emphysema in with his father and his brothers who are all smokers.  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  Currently retired Exposures: No known exposures, no asbestos, hot tub, mold, Smoking history: 40-pack-year smoking history.  Quit in 2009 Travel History: Not significant  Interim history: He continues to be stable since last visit with chronic dyspnea on exertion, cough with congestion.  He is using the Anoro but cannot tell if this is better than Symbicort. Also complains of some chest tightness with lower extremity edema.  Outpatient Encounter Medications as of 11/23/2017  Medication Sig  . albuterol (PROVENTIL HFA;VENTOLIN HFA) 108 (90 Base) MCG/ACT inhaler Inhale 2 puffs into the lungs every 4 (four) hours as needed for wheezing or shortness of breath.  Marland Kitchen amLODipine (NORVASC) 5 MG tablet Take 1 tablet (5 mg total) by mouth daily.  Marland Kitchen umeclidinium-vilanterol (ANORO ELLIPTA) 62.5-25 MCG/INH AEPB Inhale 1 puff into the lungs daily.  . [DISCONTINUED] budesonide-formoterol (SYMBICORT) 160-4.5 MCG/ACT inhaler Inhale 2 puffs into the lungs 2  (two) times daily at 10 AM and 5 PM.   No facility-administered encounter medications on file as of 11/23/2017.     Allergies as of 11/23/2017  . (No Known Allergies)    Past Medical History:  Diagnosis Date  . CHF (congestive heart failure) ([place])   . COPD (chronic obstructive pulmonary disease) ([place])   . Hypertension     No past surgical history on file.  Family History  Problem Relation Age of Onset  . CAD Mother 74  . Hypertension Mother   . Stroke Mother   . COPD Father 33  . AAA (abdominal aortic aneurysm) Father   . COPD Brother   . COPD Brother   . Cancer Neg Hx     Social History   Socioeconomic History  . Marital status: Single    Spouse name: Not on file  . Number of children: Not on file  . Years of education: Not on file  . Highest education level: Not on file  Occupational History  . Occupation: retired  Scientific laboratory technician  . Financial resource strain: Not on file  . Food insecurity:    Worry: Not on file    Inability: Not on file  . Transportation needs:    Medical: Not on file    Non-medical: Not on file  Tobacco Use  . Smoking status: Former Smoker    Packs/day: 1.00    Years: 40.00    Pack years: 40.00    Last attempt to quit: 2008    Years since quitting: 11.3  . Smokeless tobacco: Former  User  Substance and Sexual Activity  . Alcohol use: No    Frequency: Never  . Drug use: No  . Sexual activity: Never  Lifestyle  . Physical activity:    Days per week: Not on file    Minutes per session: Not on file  . Stress: Not on file  Relationships  . Social connections:    Talks on phone: Not on file    Gets together: Not on file    Attends religious service: Not on file    Active member of club or organization: Not on file    Attends meetings of clubs or organizations: Not on file    Relationship status: Not on file  . Intimate partner violence:    Fear of current or ex partner: Not on file    Emotionally abused: Not on file    Physically  abused: Not on file    Forced sexual activity: Not on file  Other Topics Concern  . Not on file  Social History Narrative  . Not on file   Review of systems: Review of Systems  Constitutional: Negative for fever and chills.  HENT: Negative.   Eyes: Negative for blurred vision.  Respiratory: as per HPI  Cardiovascular: Negative for chest pain and palpitations.  Gastrointestinal: Negative for vomiting, diarrhea, blood per rectum. Genitourinary: Negative for dysuria, urgency, frequency and hematuria.  Musculoskeletal: Negative for myalgias, back pain and joint pain.  Skin: Negative for itching and rash.  Neurological: Negative for dizziness, tremors, focal weakness, seizures and loss of consciousness.  Endo/Heme/Allergies: Negative for environmental allergies.  Psychiatric/Behavioral: Negative for depression, suicidal ideas and hallucinations.  All other systems reviewed and are negative.  Physical Exam: Blood pressure (!) 160/90, pulse (!) 115, height 6' (1.829 m), weight 159 lb (72.1 kg), SpO2 96 %. Gen:      No acute distress HEENT:  EOMI, sclera anicteric Neck:     No masses; no thyromegaly Lungs:    Clear to auscultation bilaterally; normal respiratory effort CV:         Regular rate and rhythm; no murmurs Abd:      + bowel sounds; soft, non-tender; no palpable masses, no distension Ext:    No edema; adequate peripheral perfusion Skin:      Warm and dry; no rash Neuro: alert and oriented x 3 Psych: normal mood and affect  Data Reviewed: CT chest 09/27/17-no pulmonary embolism, tree-in-bud appearance, cystic appearance in the right kidney. Chest x-ray 09/19/17-hyperinflation.  No acute abnormality  have reviewed the images personally.  Renal ultrasound 09/18/17-4.3 cm cyst in the right kidney.  No hydronephrosis.  CBC 09/17/17-WBC 12.3, eos 0% CBC 10/22/2017-WBC 11.3, eos 2.2%, absolute eosinophil count 200  Alpha-1 antitrypsin 10/22/2017-235, PI MM  PFTs 11/23/2017- FVC 2.16  (49%], FEV1 0.78 [24%], F/F 36, TLC 94%, DLCO 30% Severe obstruction and diffusion impairment  Echocardiogram 07/15/2017- LVEF 29-79%, grade 1 diastolic dysfunction, mild dilation of left atrium.  Assessment:  COPD Gold D, oxygen dependent Recent exacerbation in setting of RSV infection.  He is making a slow recovery post hospitalization PFTs reviewed with severe obstruction and diffusion impairment. We will switch to Anoro to Trelegy inhaler Continue oxygen at 2 L/min  Discussed cardiopulmonary rehab but he had therapy about 2 years ago and is getting enough exercise at home on a stationary bike.  Abnormal CT, mediastinal lymph nodes CT scan reviewed with scattered tree-in-bud, subcentimeter nodules likely secondary to inflammation.  Follow-up with repeat CT in 1 year  Diastolic heart failure, lower extremity edema, hypertension Systolic pressure has been running 150- 60 at home with mild lower extremity edema Continue Norvasc.  I will start him on Lasix 20 mg a day He has follow-up with primary care in the next 2 weeks for lab check and further follow-up.  Plan/Recommendations: - Switch Anoro to Trelegy, continue albuterol - Continue supplemental oxygen - Start Lasix 20 mg a day.  Follow-up with primary care  Marshell Garfinkel MD [facility] Pager [phone] 11/23/2017, 10:21 AM  CC: [name], DO

## 2017-11-23 NOTE — Progress Notes (Signed)
PFT completed 11/23/17. Patient had some trouble with the pleth but we were able to obtain it.

## 2017-11-23 NOTE — Patient Instructions (Signed)
We will stop the Norco and start you on trelegy inhaler Continue supplemental oxygen and exercise regimen at home We will start you on Lasix 20 mg a day for the lower leg swelling.   Please have labs including creatinine and potassium levels checked at your upcoming primary care visit and have them send the test to [place] I will follow back with you in 3 months time.

## 2017-12-11 ENCOUNTER — Other Ambulatory Visit (INDEPENDENT_AMBULATORY_CARE_PROVIDER_SITE_OTHER): Payer: Medicare Other

## 2017-12-11 DIAGNOSIS — R5381 Other malaise: Secondary | ICD-10-CM

## 2017-12-11 DIAGNOSIS — E559 Vitamin D deficiency, unspecified: Secondary | ICD-10-CM | POA: Diagnosis not present

## 2017-12-11 DIAGNOSIS — R7302 Impaired glucose tolerance (oral): Secondary | ICD-10-CM | POA: Diagnosis not present

## 2017-12-11 DIAGNOSIS — R911 Solitary pulmonary nodule: Secondary | ICD-10-CM

## 2017-12-11 DIAGNOSIS — I509 Heart failure, unspecified: Secondary | ICD-10-CM

## 2017-12-11 DIAGNOSIS — J439 Emphysema, unspecified: Secondary | ICD-10-CM | POA: Diagnosis not present

## 2017-12-11 DIAGNOSIS — R5382 Chronic fatigue, unspecified: Secondary | ICD-10-CM | POA: Diagnosis not present

## 2017-12-11 DIAGNOSIS — J9601 Acute respiratory failure with hypoxia: Secondary | ICD-10-CM | POA: Diagnosis not present

## 2017-12-11 DIAGNOSIS — R7303 Prediabetes: Secondary | ICD-10-CM | POA: Diagnosis not present

## 2017-12-11 DIAGNOSIS — I10 Essential (primary) hypertension: Secondary | ICD-10-CM

## 2017-12-12 LAB — COMPREHENSIVE METABOLIC PANEL
A/G RATIO: 2 (ref 1.2–2.2)
ALK PHOS: 112 IU/L (ref 39–117)
ALT: 17 IU/L (ref 0–44)
AST: 22 IU/L (ref 0–40)
Albumin: 4.5 g/dL (ref 3.5–4.8)
BUN/Creatinine Ratio: 17 (ref 10–24)
BUN: 18 mg/dL (ref 8–27)
Bilirubin Total: 0.2 mg/dL (ref 0.0–1.2)
CO2: 28 mmol/L (ref 20–29)
Calcium: 9.8 mg/dL (ref 8.6–10.2)
Chloride: 99 mmol/L (ref 96–106)
Creatinine, Ser: 1.09 mg/dL (ref 0.76–1.27)
GFR calc Af Amer: 78 mL/min/{1.73_m2} (ref >59)
GFR calc non Af Amer: 67 mL/min/{1.73_m2} (ref >59)
GLOBULIN, TOTAL: 2.3 g/dL (ref 1.5–4.5)
Glucose: 93 mg/dL (ref 65–99)
Potassium: 4.1 mmol/L (ref 3.5–5.2)
SODIUM: 143 mmol/L (ref 134–144)
Total Protein: 6.8 g/dL (ref 6.0–8.5)

## 2017-12-12 LAB — CBC WITH DIFFERENTIAL/PLATELET
BASOS: 1 %
Basophils Absolute: 0.1 10*3/uL (ref 0.0–0.2)
EOS (ABSOLUTE): 0.3 10*3/uL (ref 0.0–0.4)
Eos: 4 %
Hematocrit: 43.6 % (ref 37.5–51.0)
Hemoglobin: 14.5 g/dL (ref 13.0–17.7)
IMMATURE GRANS (ABS): 0 10*3/uL (ref 0.0–0.1)
Immature Granulocytes: 1 %
Lymphocytes Absolute: 2.9 10*3/uL (ref 0.7–3.1)
Lymphs: 37 %
MCH: 30.5 pg (ref 26.6–33.0)
MCHC: 33.3 g/dL (ref 31.5–35.7)
MCV: 92 fL (ref 79–97)
Monocytes Absolute: 0.7 10*3/uL (ref 0.1–0.9)
Monocytes: 9 %
NEUTROS ABS: 3.8 10*3/uL (ref 1.4–7.0)
Neutrophils: 48 %
Platelets: 337 10*3/uL (ref 150–450)
RBC: 4.75 x10E6/uL (ref 4.14–5.80)
RDW: 14 % (ref 12.3–15.4)
WBC: 7.7 10*3/uL (ref 3.4–10.8)

## 2017-12-12 LAB — TSH: TSH: 2.63 u[IU]/mL (ref 0.450–4.500)

## 2017-12-12 LAB — LIPID PANEL
CHOL/HDL RATIO: 4.6 ratio (ref 0.0–5.0)
Cholesterol, Total: 260 mg/dL — ABNORMAL HIGH (ref 100–199)
HDL: 56 mg/dL (ref >39)
LDL Calculated: 180 mg/dL — ABNORMAL HIGH (ref 0–99)
Triglycerides: 118 mg/dL (ref 0–149)
VLDL Cholesterol Cal: 24 mg/dL (ref 5–40)

## 2017-12-12 LAB — HEMOGLOBIN A1C
Est. average glucose Bld gHb Est-mCnc: 105 mg/dL
HEMOGLOBIN A1C: 5.3 % (ref 4.8–5.6)

## 2017-12-12 LAB — VITAMIN D 25 HYDROXY (VIT D DEFICIENCY, FRACTURES): VIT D 25 HYDROXY: 28.9 ng/mL — AB (ref 30.0–100.0)

## 2017-12-12 LAB — T4, FREE: Free T4: 1.27 ng/dL (ref 0.82–1.77)

## 2017-12-13 ENCOUNTER — Ambulatory Visit (INDEPENDENT_AMBULATORY_CARE_PROVIDER_SITE_OTHER): Payer: Medicare Other | Admitting: Family Medicine

## 2017-12-13 ENCOUNTER — Encounter: Payer: Self-pay | Admitting: Family Medicine

## 2017-12-13 VITALS — BP 142/87 | HR 110 | Ht 72.0 in | Wt 161.9 lb

## 2017-12-13 DIAGNOSIS — R5382 Chronic fatigue, unspecified: Secondary | ICD-10-CM

## 2017-12-13 DIAGNOSIS — Z87891 Personal history of nicotine dependence: Secondary | ICD-10-CM | POA: Diagnosis not present

## 2017-12-13 DIAGNOSIS — I5032 Chronic diastolic (congestive) heart failure: Secondary | ICD-10-CM | POA: Diagnosis not present

## 2017-12-13 DIAGNOSIS — E785 Hyperlipidemia, unspecified: Secondary | ICD-10-CM

## 2017-12-13 DIAGNOSIS — E559 Vitamin D deficiency, unspecified: Secondary | ICD-10-CM | POA: Diagnosis not present

## 2017-12-13 DIAGNOSIS — I10 Essential (primary) hypertension: Secondary | ICD-10-CM | POA: Diagnosis not present

## 2017-12-13 DIAGNOSIS — R5381 Other malaise: Secondary | ICD-10-CM | POA: Diagnosis not present

## 2017-12-13 MED ORDER — VITAMIN D3 125 MCG (5000 UT) PO TABS: ORAL_TABLET | ORAL | 3 refills | Status: DC

## 2017-12-13 MED ORDER — METOPROLOL SUCCINATE ER 50 MG PO TB24: 50.0000 mg | ORAL_TABLET | Freq: Every day | ORAL | 3 refills | Status: DC

## 2017-12-13 MED ORDER — FUROSEMIDE 20 MG PO TABS: 10.0000 mg | ORAL_TABLET | Freq: Every day | ORAL | 1 refills | Status: DC

## 2017-12-13 MED ORDER — ATORVASTATIN CALCIUM 40 MG PO TABS: ORAL_TABLET | ORAL | 3 refills | Status: DC

## 2017-12-13 NOTE — Progress Notes (Signed)
Assessment and plan:  1. Essential hypertension   2. Chronic diastolic heart failure ([place])   3. Hyperlipidemia, unspecified hyperlipidemia type   4. Stopped smoking 2008 with greater than 40 pack year history   5. Physical deconditioning   6. Chronic fatigue   7. Vitamin D insufficiency     1. Mild Diastolic Heart Failure (Grade 1) - Patient with mild diastolic heart failure, grade 1, seen on echo in 06/2017.  Recommend repeat studies once yearly.  2. Blood Pressure - Patient is not in fluid overload right now, dose of Lasix reduced from 20 down to 10.  - New blood pressure medicine prescribed today. - Continue on 5 mg Norvasc for now.  - Reviewed that the patient's blood pressure should not be in the 150's/mid-90's.  For a man at 72 years old with a greater than 40 pack-year smoking history, we want his blood pressure consistently, ideally less than 140/90.  - Continue to regularly monitor blood pressure and pulse at home, keeping a log of ambulatory measurements.  3. Lab Review (12/11/2017) - Kidney function - improved over time. - Reviewed with the patient that several signs present in his labs indicate that he does not adequately hydrate.  - Liver  Function = WNL. - Electrolytes = WNL. - Thyroid = WNL. - CBC = WNL. - HbA1c =  5.3, WNL.  - Low Vitamin D - 28.9 - Patient agrees to begin taking once daily Vitamin D.  - Lipid Panel - Statin Prescribed Today - Triglycerides = 118, WNL. - HDL = 56, WNL. - VLDL = 24, WNL. - LDL = 180, elevated.  The 10-year ASCVD risk score Mikey Bussing DC Brooke Bonito., et al., 2013) is: 35.2%   Values used to calculate the score:     Age: 72 years     Sex: Male     Is Non-Hispanic African American: No     Diabetic: No     Tobacco smoker: No     Systolic Blood Pressure: 035 mmHg     Is BP treated: Yes     HDL Cholesterol: 56 mg/dL     Total Cholesterol: 260 mg/dL  - Consume a  prudent low-cholesterol diet, with use of medication to decrease LDL below 100. - Handout provided today on prudent low-cholesterol diet.  - Advised adjustment of diet and exercise, along with medications. - Continue to stay as active as possible to improve HDL value and lower LDL value.  - Patient will take newly prescribed statin each evening before bedtime.  - Emphasized the critical importance of hydration while on statins, to reduce incidence of S-E.  4. Exercise & Lifestyle Habits - Advised patient to hydrate adequately, a minimum of 80 ounces of water per day (half of his body weight in oz of water).  - Advised patient to continue working toward exercise to improve health, as tolerated.  - PT with COPD and past heart failure, but will begin with 15 minutes of activity daily as tolerated.  Recommended that the patient eventually strive for at least 150 minutes of activity per week according to guidelines established by the [facility].   - Healthy dietary habits encouraged, including low-carb, and high amounts of lean protein in diet.   5. Follow-Up - Patient will let us know immediately if he has difficulty lying flat, notes leg swelling, chest pain, or other symptoms of returning heart failure.  - In 6 weeks, return for evaluation of progress on new  BP medication and cholesterol medication. - Blood work will be obtained on kidney and liver function.  - Re-check Vitamin D at next chronic follow up.  - Patient knows to return as scheduled, or as needed if any alarming symptoms arise.   Education and routine counseling performed. Handouts provided.  No orders of the defined types were placed in this encounter.   Meds ordered this encounter  Medications  . furosemide (LASIX) 20 MG tablet    Sig: Take 0.5 tablets (10 mg total) by mouth daily.    Dispense:  45 tablet    Refill:  1  . atorvastatin (LIPITOR) 40 MG tablet    Sig: 1 tablet before bed daily    Dispense:  90 tablet     Refill:  3  . metoprolol succinate (TOPROL-XL) 50 MG 24 hr tablet    Sig: Take 1 tablet (50 mg total) by mouth daily. Take with or immediately following a meal.    Dispense:  90 tablet    Refill:  3  . Cholecalciferol (VITAMIN D3) 5000 units TABS    Sig: 5,000 IU OTC vitamin D3 daily.    Dispense:  90 tablet    Refill:  3     Return in about 6 weeks (around 01/24/2018) for Started Lipitor, metoprolol, decrease Lasix by half.   Anticipatory guidance and routine counseling done re: condition, txmnt options and need for follow up. All questions of patient's were answered.   Gross side effects, risk and benefits, and alternatives of medications discussed with patient.  Patient is aware that all medications have potential side effects and we are unable to predict every sideeffect or drug-drug interaction that may occur.  Expresses verbal understanding and consents to current therapy plan and treatment regiment.  Please see AVS handed out to patient at the end of our visit for additional patient instructions/ counseling done pertaining to today's office visit.  Note: This document was prepared using Dragon voice recognition software and may include unintentional dictation errors.  This document serves as a record of services personally performed by [name], DO. It was created on her behalf by [name], a trained medical scribe. The creation of this record is based on the scribe's personal observations and the provider's statements to them.   I have reviewed the above medical documentation for accuracy and completeness and I concur.  [name] 12/20/17 11:08 AM   ----------------------------------------------------------------------------------------------------------------------  Subjective:   CC:   George Frazier is a 72 y.o. male who presents to [place] at [facility] today for review and discussion of recent bloodwork that was  done.  1. All recent blood work that we ordered was reviewed with patient today.  Patient was counseled on all abnormalities and we discussed dietary and lifestyle changes that could help those values (also medications when appropriate).  Extensive health counseling performed and all patient's concerns/ questions were addressed.   Patient has not made an appointment to get his eyes checked yet.  Notes "I've got to make it, it's getting worse," commenting on his vision.  Patient notes that he usually has a couple of 12 ounce bottles of water, and about 12 ounces of coffee.  Patient has never been on cholesterol medications in the past.  1. HTN HPI:  -  His blood pressure has not been controlled at home.  Pt is checking it often at home.   At home, his blood pressure has been 140-150/90-95.  His pulse at  home has been 93-98. Patient tries to check his blood pressure daily in both the morning and the evening.  Patient has never seen a cardiologist or heart doctor in the past.  He was diagnosed with heart failure in the past through [facility], but cannot remember who or when.  - Patient reports good compliance with blood pressure medications. Patient takes Lasix and Norvasc daily.  - Denies medication S-E   - Smoking Status noted   - He denies new onset of: chest pain, exercise intolerance, shortness of breath, dizziness, visual changes, headache, lower extremity swelling or claudication.   Denies SOB while lying flat; no more while lying flat than while sitting.  Denies swelling in his legs, noting "it's gotten a whole lot better."  Otherwise denies symptoms of heart failure.  Last 3 blood pressure readings in our office are as follows: BP Readings from Last 3 Encounters:  12/13/17 (!) 142/87  11/23/17 (!) 160/90  11/06/17 (!) 176/103    Filed Weights   12/13/17 1008  Weight: 161 lb 14.4 oz (73.4 kg)     Wt Readings from Last 3 Encounters:  12/13/17 161 lb 14.4 oz (73.4 kg)   11/23/17 159 lb (72.1 kg)  11/06/17 157 lb (71.2 kg)   BP Readings from Last 3 Encounters:  12/13/17 (!) 142/87  11/23/17 (!) 160/90  11/06/17 (!) 176/103   Pulse Readings from Last 3 Encounters:  12/13/17 (!) 110  11/23/17 (!) 115  11/06/17 (!) 107   BMI Readings from Last 3 Encounters:  12/13/17 21.96 kg/m  11/23/17 21.56 kg/m  11/06/17 21.29 kg/m     Patient Care Team    Relationship Specialty Notifications Start End  [name], DO PCP - General Family Medicine  10/22/17   Marshell Garfinkel, MD Consulting Physician Pulmonary Disease  11/06/17     Full medical history updated and reviewed in the office today  Patient Active Problem List   Diagnosis Date Noted  . Hyperlipidemia 12/13/2017  . Vitamin D insufficiency 12/13/2017  . Physical deconditioning 11/15/2017  . Hypertension 11/06/2017  . Stopped smoking 2008 with greater than 40 pack year history 11/06/2017  . Chronic fatigue 11/06/2017  . Dyspnea on minimal exertion-likely due to COPD 11/06/2017  . Acute respiratory failure with hypoxia ([place]) 09/17/2017  . CAP (community acquired pneumonia) 07/14/2017  . COPD, group D, by GOLD 2017 classification ([place]) 07/14/2017  . Heart failure ([place]) 07/14/2017    Past Medical History:  Diagnosis Date  . CHF (congestive heart failure) ([place])   . COPD (chronic obstructive pulmonary disease) ([place])   . Hypertension     No past surgical history on file.  Social History   Tobacco Use  . Smoking status: Former Smoker    Packs/day: 1.00    Years: 40.00    Pack years: 40.00    Last attempt to quit: 2008    Years since quitting: 11.4  . Smokeless tobacco: Former Network engineer Use Topics  . Alcohol use: No    Frequency: Never    Family Hx: Family History  Problem Relation Age of Onset  . CAD Mother 56  . Hypertension Mother   . Stroke Mother   . COPD Father 71  . AAA (abdominal aortic aneurysm) Father   . COPD Brother   . COPD Brother   . Cancer Neg  Hx      Medications: Current Outpatient Medications  Medication Sig Dispense Refill  . albuterol (PROVENTIL HFA;VENTOLIN HFA) 108 (90 Base) MCG/ACT inhaler  Inhale 2 puffs into the lungs every 4 (four) hours as needed for wheezing or shortness of breath. 1 Inhaler 2  . amLODipine (NORVASC) 5 MG tablet Take 1 tablet (5 mg total) by mouth daily. 90 tablet 0  . Fluticasone-Umeclidin-Vilant (TRELEGY ELLIPTA) 100-62.5-25 MCG/INH AEPB Inhale 1 puff into the lungs daily. 60 each 5  . furosemide (LASIX) 20 MG tablet Take 0.5 tablets (10 mg total) by mouth daily. 45 tablet 1  . atorvastatin (LIPITOR) 40 MG tablet 1 tablet before bed daily 90 tablet 3  . Cholecalciferol (VITAMIN D3) 5000 units TABS 5,000 IU OTC vitamin D3 daily. 90 tablet 3  . metoprolol succinate (TOPROL-XL) 50 MG 24 hr tablet Take 1 tablet (50 mg total) by mouth daily. Take with or immediately following a meal. 90 tablet 3   No current facility-administered medications for this visit.     Allergies:  No Known Allergies   Review of Systems: General:   No F/C, wt loss Pulm:   No DIB, SOB, pleuritic chest pain Card:  No CP, palpitations Abd:  No n/v/d or pain Ext:  No inc edema from baseline  Objective:  Blood pressure (!) 142/87, pulse (!) 110, height 6' (1.829 m), weight 161 lb 14.4 oz (73.4 kg), SpO2 97 %. Body mass index is 21.96 kg/m. Gen:   Well NAD, A and O *3 HEENT:    [place]/AT, EOMI,  MMM Lungs:   Normal work of breathing. CTA B/L, no Wh, rhonchi Heart:   RRR, S1, S2 WNL's, no MRG Abd:   No gross distention Exts:    warm, pink,  Brisk capillary refill, warm and well perfused.  Psych:    No HI/SI, judgement and insight good, Euthymic mood. Full Affect.   Recent Results (from the past 2160 hour(s))  Glucose, capillary     Status: Abnormal   Collection Time: 09/21/17 11:21 AM  Result Value Ref Range   Glucose-Capillary 163 (H) 65 - 99 mg/dL  Alpha-1 antitrypsin phenotype     Status: Abnormal   Collection Time:  10/22/17  2:06 PM  Result Value Ref Range   A-1 Antitrypsin, Ser 235 (H) 83 - 199 mg/dL   ALPHA-1-ANTITRYPSIN (AAT) PHENOTYPE SEE NOTE     Comment: THIS PATIENT'S ALPHA-1-ANTITRYPSIN 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 Marland Kitchen 90% of normal individuals have the MM phenotype, with normal quantitative AAT levels. Many phenotypic patterns have been described, including deficiency states with F, S, Z, or other alleles. As a general estimation, compared to M allele of 100% of normal A-1-Antitrypsin protein, the S allele produces approximately 60% and the Z allele 20%. For example, an MS phenotype would have about 80% of normal A-1-Antitrypsin protein level, a 50% contribution from the M allele and 30% from the S allele. A ZZ phenotype would have about 20% of normal levels, a 10% contribution from each Z gene. The F allele has normal A-1-Antitrypsin levels, but the kinetics of elastase inhibition is not as efficient as an M allele product; F alleles should be considered functionally mildly deficient. Other variants are identifiable by phenotypic analysis. These include CM, DP, EM, GM, IS, LM, M1M2, M3M3, MP, MT, XX, MY, and M1N. I, P, T and  null alleles are considered deleterious. C, D, E, G, L, M1, M2, M3, X and Y alleles are generally considered normal variants. The MZ-Pratt phenotype is a normal variant; care should be taken to avoid confusion with the deficient MZ phenotype.   CBC w/Diff     Status: Abnormal  Collection Time: 10/22/17  2:06 PM  Result Value Ref Range   WBC 11.3 (H) 4.0 - 10.5 K/uL   RBC 4.40 4.22 - 5.81 Mil/uL   Hemoglobin 13.0 13.0 - 17.0 g/dL   HCT 40.1 39.0 - 52.0 %   MCV 91.2 78.0 - 100.0 fl   MCHC 32.5 30.0 - 36.0 g/dL   RDW 14.8 11.5 - 15.5 %   Platelets 535.0 (H) 150.0 - 400.0 K/uL   Neutrophils Relative % 64.3 43.0 - 77.0 %   Lymphocytes Relative 22.5 12.0 - 46.0 %   Monocytes Relative 10.0 3.0 - 12.0 %   Eosinophils Relative 2.2 0.0 - 5.0 %   Basophils Relative 1.0 0.0  - 3.0 %   Neutro Abs 7.3 1.4 - 7.7 K/uL   Lymphs Abs 2.5 0.7 - 4.0 K/uL   Monocytes Absolute 1.1 (H) 0.1 - 1.0 K/uL   Eosinophils Absolute 0.2 0.0 - 0.7 K/uL   Basophils Absolute 0.1 0.0 - 0.1 K/uL  Pulmonary Function Test     Status: None   Collection Time: 11/23/17  8:55 AM  Result Value Ref Range   FVC-Pre 2.06 L   FVC-%Pred-Pre 47 %   FVC-Post 2.16 L   FVC-%Pred-Post 49 %   FVC-%Change-Post 4 %   FEV1-Pre 0.78 L   FEV1-%Pred-Pre 24 %   FEV1-Post 0.78 L   FEV1-%Pred-Post 24 %   FEV1-%Change-Post 0 %   FEV6-Pre 1.97 L   FEV6-%Pred-Pre 47 %   FEV6-Post 1.99 L   FEV6-%Pred-Post 48 %   FEV6-%Change-Post 1 %   Pre FEV1/FVC ratio 38 %   FEV1FVC-%Pred-Pre 51 %   Post FEV1/FVC ratio 36 %   FEV1FVC-%Change-Post -4 %   Pre FEV6/FVC Ratio 96 %   FEV6FVC-%Pred-Pre 101 %   Post FEV6/FVC ratio 92 %   FEV6FVC-%Pred-Post 98 %   FEV6FVC-%Change-Post -3 %   FEF 25-75 Pre 0.34 L/sec   FEF2575-%Pred-Pre 14 %   FEF 25-75 Post 0.35 L/sec   FEF2575-%Pred-Post 14 %   FEF2575-%Change-Post 1 %   RV 3.01 L   RV % pred 120 %   TLC 6.66 L   TLC % pred 94 %   DLCO unc 9.98 ml/min/mmHg   DLCO unc % pred 30 %   DL/VA 2.08 ml/min/mmHg/L   DL/VA % pred 45 %  T4, free     Status: None   Collection Time: 12/11/17  8:54 AM  Result Value Ref Range   Free T4 1.27 0.82 - 1.77 ng/dL  CBC with Differential/Platelet     Status: None   Collection Time: 12/11/17  8:54 AM  Result Value Ref Range   WBC 7.7 3.4 - 10.8 x10E3/uL   RBC 4.75 4.14 - 5.80 x10E6/uL   Hemoglobin 14.5 13.0 - 17.7 g/dL   Hematocrit 43.6 37.5 - 51.0 %   MCV 92 79 - 97 fL   MCH 30.5 26.6 - 33.0 pg   MCHC 33.3 31.5 - 35.7 g/dL   RDW 14.0 12.3 - 15.4 %   Platelets 337 150 - 450 x10E3/uL    Comment:               **Please note reference interval change**   Neutrophils 48 Not Estab. %   Lymphs 37 Not Estab. %   Monocytes 9 Not Estab. %   Eos 4 Not Estab. %   Basos 1 Not Estab. %   Neutrophils Absolute 3.8 1.4 - 7.0 x10E3/uL    Lymphocytes Absolute 2.9 0.7 -  3.1 x10E3/uL   Monocytes Absolute 0.7 0.1 - 0.9 x10E3/uL   EOS (ABSOLUTE) 0.3 0.0 - 0.4 x10E3/uL   Basophils Absolute 0.1 0.0 - 0.2 x10E3/uL   Immature Granulocytes 1 Not Estab. %   Immature Grans (Abs) 0.0 0.0 - 0.1 x10E3/uL  VITAMIN D 25 Hydroxy (Vit-D Deficiency, Fractures)     Status: Abnormal   Collection Time: 12/11/17  8:54 AM  Result Value Ref Range   Vit D, 25-Hydroxy 28.9 (L) 30.0 - 100.0 ng/mL    Comment: Vitamin D deficiency has been defined by the [place] and an Endocrine Society practice guideline as a level of serum 25-OH vitamin D less than 20 ng/mL (1,2). The Endocrine Society went on to further define vitamin D insufficiency as a level between 21 and 29 ng/mL (2). 1. IOM (Institute of Medicine). 2010. Dietary reference    intakes for calcium and D. [place]: The    [employer]. 2. Holick MF, Binkley Hull, Bischoff-Ferrari HA, et al.    Evaluation, treatment, and prevention of vitamin D    deficiency: an Endocrine Society clinical practice    guideline. JCEM. 2011 Jul; 96(7):1911-30.   TSH     Status: None   Collection Time: 12/11/17  8:54 AM  Result Value Ref Range   TSH 2.630 0.450 - 4.500 uIU/mL  Lipid panel     Status: Abnormal   Collection Time: 12/11/17  8:54 AM  Result Value Ref Range   Cholesterol, Total 260 (H) 100 - 199 mg/dL   Triglycerides 118 0 - 149 mg/dL   HDL 56 >39 mg/dL   VLDL Cholesterol Cal 24 5 - 40 mg/dL   LDL Calculated 180 (H) 0 - 99 mg/dL   Chol/HDL Ratio 4.6 0.0 - 5.0 ratio    Comment:                                   T. Chol/HDL Ratio                                             Men  Women                               1/2 Avg.Risk  3.4    3.3                                   Avg.Risk  5.0    4.4                                2X Avg.Risk  9.6    7.1                                3X Avg.Risk 23.4   11.0   Hemoglobin A1c     Status: None   Collection Time: 12/11/17   8:54 AM  Result Value Ref Range   Hgb A1c MFr Bld 5.3 4.8 - 5.6 %    Comment:          Prediabetes: 5.7 - 6.4  Diabetes: >6.4          Glycemic control for adults with diabetes: <7.0    Est. average glucose Bld gHb Est-mCnc 105 mg/dL  Comprehensive metabolic panel     Status: None   Collection Time: 12/11/17  8:54 AM  Result Value Ref Range   Glucose 93 65 - 99 mg/dL   BUN 18 8 - 27 mg/dL   Creatinine, Ser 1.09 0.76 - 1.27 mg/dL   GFR calc non Af Amer 67 >59 mL/min/1.73   GFR calc Af Amer 78 >59 mL/min/1.73   BUN/Creatinine Ratio 17 10 - 24   Sodium 143 134 - 144 mmol/L   Potassium 4.1 3.5 - 5.2 mmol/L   Chloride 99 96 - 106 mmol/L   CO2 28 20 - 29 mmol/L   Calcium 9.8 8.6 - 10.2 mg/dL   Total Protein 6.8 6.0 - 8.5 g/dL   Albumin 4.5 3.5 - 4.8 g/dL   Globulin, Total 2.3 1.5 - 4.5 g/dL   Albumin/Globulin Ratio 2.0 1.2 - 2.2   Bilirubin Total 0.2 0.0 - 1.2 mg/dL   Alkaline Phosphatase 112 39 - 117 IU/L   AST 22 0 - 40 IU/L   ALT 17 0 - 44 IU/L

## 2017-12-13 NOTE — Patient Instructions (Addendum)
-  It is very important that you start off taking one half of the metoprolol blood pressure medicine daily and see how it affects you and makes you feel.  If after a couple of days, you feel fine and not dizzy and the blood pressure is still not less than 140/90 on a regular basis then go ahead and take the full tablet as written on your bottle.  It is okay to start the atorvastatin before bedtime for your cholesterol.  -I will need to see you in 6 weeks to recheck a kidney and liver panel as well as see how you are doing on the new medications.  Please bring a log of your blood pressures from home  Drink 80 ounces of water per day; this is a minimum of 6 water bottles per day. Try to drink more in the early parts of the day to avoid the need to get up at night.  Guidelines for a Low Cholesterol, Low Saturated Fat Diet   Fats - Limit total intake of fats and oils. - Avoid butter, stick margarine, shortening, lard, palm and coconut oils. - Limit mayonnaise, salad dressings, gravies and sauces, unless they are homemade with low-fat ingredients. - Limit chocolate. - Choose low-fat and nonfat products, such as low-fat mayonnaise, low-fat or non-hydrogenated peanut butter, low-fat or fat-free salad dressings and nonfat gravy. - Use vegetable oil, such as canola or olive oil. - Look for margarine that does not contain trans fatty acids. - Use nuts in moderate amounts. - Read ingredient labels carefully to determine both amount and type of fat present in foods. Limit saturated and trans fats! - Avoid high-fat processed and convenience foods.  Meats and Meat Alternatives - Choose fish, chicken, [place] and lean meats. - Use dried beans, peas, lentils and tofu. - Limit egg yolks to three to four per week. - If you eat red meat, limit to no more than three servings per week and choose loin or round cuts. - Avoid fatty meats, such as bacon, sausage, franks, luncheon meats and ribs. - Avoid all organ  meats, including liver.  Dairy - Choose nonfat or low-fat milk, yogurt and cottage cheese. - Most cheeses are high in fat. Choose cheeses made from non-fat milk, such as mozzarella and ricotta cheese. - Choose light or fat-free cream cheese and sour cream. - Avoid cream and sauces made with cream.  Fruits and Vegetables - Eat a wide variety of fruits and vegetables. - Use lemon juice, vinegar or "mist" olive oil on vegetables. - Avoid adding sauces, fat or oil to vegetables.  Breads, Cereals and Grains - Choose whole-grain breads, cereals, pastas and rice. - Avoid high-fat snack foods, such as granola, cookies, pies, pastries, doughnuts and croissants.  Cooking Tips - Avoid deep fried foods. - Trim visible fat off meats and remove skin from poultry before cooking. - Bake, broil, boil, poach or roast poultry, fish and lean meats. - Drain and discard fat that drains out of meat as you cook it. - Add little or no fat to foods. - Use vegetable oil sprays to grease pans for cooking or baking. - Steam vegetables. - Use herbs or no-oil marinades to flavor foods.

## 2018-01-09 ENCOUNTER — Telehealth: Payer: Self-pay | Admitting: Family Medicine

## 2018-01-09 NOTE — Telephone Encounter (Signed)
Patient's sister called states he has a very runny nose & cough w/ sinus pressure , he wants to take OTC med instead of coming to see provider and want suggestions on what he can take OTC that won't conflict with his Rx meds.  Please call them at [phone].  --forwarding message to medical assistant.  --Dion Body

## 2018-01-09 NOTE — Telephone Encounter (Signed)
Patient last seen 12/13/2017.  Please advise of OTC medication that will be best for him.

## 2018-01-10 NOTE — Telephone Encounter (Signed)
How long have sx been going on?   F/C?   One sided face pain?   Productive cough/  SOB?    thnx

## 2018-01-11 NOTE — Telephone Encounter (Signed)
Called patient to inform them that Dr. Raliegh Scarlet recommends Coricidin HBP. Left message for patient to call back. MPulliam, CMA/RT(R)

## 2018-01-11 NOTE — Telephone Encounter (Signed)
Spoke to the patient's sister - per patient pressure/pain is on both sides of the face.  Cough is productive with white phlegm. Patient denies SOB and fevers.  Symptoms have been 3-4 days.  Please advise. MPulliam, CMA/RT(R)

## 2018-01-24 ENCOUNTER — Encounter: Payer: Self-pay | Admitting: Family Medicine

## 2018-01-24 ENCOUNTER — Ambulatory Visit (INDEPENDENT_AMBULATORY_CARE_PROVIDER_SITE_OTHER): Payer: Medicare Other | Admitting: Family Medicine

## 2018-01-24 VITALS — BP 183/113 | HR 66 | Ht 72.0 in | Wt 156.9 lb

## 2018-01-24 DIAGNOSIS — I5032 Chronic diastolic (congestive) heart failure: Secondary | ICD-10-CM | POA: Diagnosis not present

## 2018-01-24 DIAGNOSIS — I10 Essential (primary) hypertension: Secondary | ICD-10-CM | POA: Diagnosis not present

## 2018-01-24 DIAGNOSIS — I509 Heart failure, unspecified: Secondary | ICD-10-CM | POA: Diagnosis not present

## 2018-01-24 DIAGNOSIS — J439 Emphysema, unspecified: Secondary | ICD-10-CM

## 2018-01-24 DIAGNOSIS — E785 Hyperlipidemia, unspecified: Secondary | ICD-10-CM | POA: Diagnosis not present

## 2018-01-24 MED ORDER — OLMESARTAN-AMLODIPINE-HCTZ 20-5-12.5 MG PO TABS: ORAL_TABLET | ORAL | 0 refills | Status: DC

## 2018-01-24 MED ORDER — ATORVASTATIN CALCIUM 80 MG PO TABS: ORAL_TABLET | ORAL | 3 refills | Status: DC

## 2018-01-24 MED ORDER — FUROSEMIDE 20 MG PO TABS: 20.0000 mg | ORAL_TABLET | Freq: Every day | ORAL | 1 refills | Status: DC

## 2018-01-24 NOTE — Progress Notes (Signed)
Impression and Recommendations:    1. Essential hypertension- poorly controlled   2. Hyperlipidemia, unspecified hyperlipidemia type   3. Chronic diastolic heart failure (HCC)   4. Pulmonary emphysema, unspecified emphysema type ([place])   5. Heart failure, unspecified HF chronicity, unspecified heart failure type ([place])      - Patient's blood pressure severely worsened from prior.  Without further workup and treatment planned, risk to pt's morbidity is high with possible prolonged functional impairment and poor health outcomes.   1. Mild Diastolic Heart Failure (Grade 1) - Lasix upped to full tablet of 20 mg daily.  - Patient with mild diastolic heart failure, grade 1, seen on echo in 06/2017.  Recommend repeat studies once yearly.  - Asymptomatic at this time.  2. Pulmonology - Continue to follow-up for COPD treatment and maintenance with Dr. Vaughan Browner.  3. Other [place] patient to visit dermatology.  Patient is not eager or willing to visit dermatology and denies referral several times.  4. Hypertension - Medications changed today.  Patient taken off of plain amlodipine and prescribed combination olmesartan-amlodipine-hydrochlorothiazide.  - Patient is asymptomatic, but blood pressure is still poorly controlled.  In office today, denies chest pain, denies increasing shortness of breath, denies chest tightness, denies headaches, and notes no new symptoms he can think of.  - Counseled patient that when people have extremely high blood pressures, this can lead to CHF and worsening of CHF.  Advised patient that if his blood pressure can't be controlled by his next visit, he must visit cardiology.  - Reviewed again that for a man of 72 years old with a greater than 40 pack-year smoking history, his blood pressure should consistently ideally fall less than 140/90.  - Lifestyle changes such as dash diet and engaging in a regular exercise program discussed with patient.   Educational handouts provided  - Continue to regularly monitor blood pressure and pulse at home, keeping a log of ambulatory measurements.  5. Hyperlipidemia - Lipitor dose increased from 40 to 80.  - Educational handouts provided at patient's desire.  Last Lipid Panel - Statin Prescribed Last OV 12/13/2017 - Triglycerides = 118, WNL. - HDL = 56, WNL. - VLDL = 24, WNL. - LDL = 180, elevated.  The 10-year ASCVD risk score Mikey Bussing DC Brooke Bonito., et al., 2013) is: 35.2%   Values used to calculate the score:     Age: 72 years     Sex: Male     Is Non-Hispanic African American: No     Diabetic: No     Tobacco smoker: No     Systolic Blood Pressure: 409 mmHg     Is BP treated: Yes     HDL Cholesterol: 56 mg/dL     Total Cholesterol: 260 mg/dL  - Consume a prudent low-cholesterol diet, with use of medication to decrease LDL below 100.  - Advised adjustment of diet and exercise, along with medications. - Continue to stay as active as possible to improve HDL value and lower LDL value.  - Patient will continue to take prescribed statin each evening before bedtime.  - Emphasized the critical importance of hydration while on statins, to reduce incidence of S-E.  6. Low Vitamin D - Patient will continue taking once daily Vitamin D.  7. Exercise & Lifestyle Habits - Advised patient to hydrate adequately, a minimum of 80 ounces of water per day (half of his body weight in oz of water).  - Advised patient to continue  working toward exercise to improve health, as tolerated.  - PT with COPD and past heart failure, but will begin with 10-15 minutes of activity daily as tolerated.  Recommended that the patient eventually strive for adequate activity per week, as tolerated, according to guidelines established by the [facility].   - Healthy dietary habits encouraged, including low-carb, and high amounts of lean protein in diet.    Education and routine counseling performed. Handouts  provided.  8. Follow-Up - Re-check ALT today. - Return in 6-8 weeks for re-evaluation of progress on new BP medication. - Patient will bring a written log of ambulatory blood pressures and pulse/HR taken at home.  - STRONGLY encouraged patient to please let us know if he would like to reconsider visiting dermatology.  - Patient will let us know immediately if he has difficulty lying flat, notes leg swelling, chest pain, or other symptoms of returning heart failure.  - Patient knows to return as scheduled, or as needed if any alarming symptoms arise.   No orders of the defined types were placed in this encounter.   Meds ordered this encounter  Medications  . atorvastatin (LIPITOR) 80 MG tablet    Sig: 1 tablet before bed daily    Dispense:  90 tablet    Refill:  3  . Olmesartan-amLODIPine-HCTZ 20-5-12.5 MG TABS    Sig: 0.5tab QD for 1 week, then 1 tab po qd    Dispense:  90 tablet    Refill:  0  . furosemide (LASIX) 20 MG tablet    Sig: Take 1 tablet (20 mg total) by mouth daily.    Dispense:  90 tablet    Refill:  1     The patient was counseled, risk factors were discussed, anticipatory guidance given.  Gross side effects, risk and benefits, and alternatives of medications discussed with patient.  Patient is aware that all medications have potential side effects and we are unable to predict every side effect or drug-drug interaction that may occur.  Expresses verbal understanding and consents to current therapy plan and treatment regimen.  Return for 6wks- changed BP meds, inc lipitor and lasix, will need CMP.  Please see AVS handed out to patient at the end of our visit for further patient instructions/ counseling done pertaining to today's office visit.    Note: This document was prepared using Dragon voice recognition software and may include unintentional dictation errors.   This document serves as a record of services personally performed by [name], DO.  It was created on her behalf by [name], a trained medical scribe. The creation of this record is based on the scribe's personal observations and the provider's statements to them.   I have reviewed the above medical documentation for accuracy and completeness and I concur.  [name] 01/24/18 3:10 PM    Subjective:    HPI: George Frazier is a 72 y.o. male who presents to [facility] today for follow up for HTN.    Patient follows up with Dr. Marshell Garfinkel for Pulmonology.  He does not follow up with Cardiology.  HTN: Patient eats home-cooked meals.  Notes "sometimes I'll have a piece of pizza or something like that."  Notes that he eats a lot of green vegetables; "she does a great job, better than I would do."  -  His blood pressure has not been controlled at home.  States that at home, his blood pressure runs from 185, dropping  down to about 140, over usually around 90.  Per patient, blood pressure is "still up and down."  Isn't sure why his blood pressure is high today.  When he checks his blood pressure, he's usually been sitting for 15-20 minutes quietly prior to checking it.  Notes sometimes he hasn't.  - Patient reports good compliance with blood pressure medications.  On his changed medications, he feels that his breathing is a little better.  State "it's not great, but I can handle it where it's at."  - Denies medication S-E   - Smoking Status noted   - He denies new onset of: chest pain, exercise intolerance, shortness of breath, dizziness, visual changes, headache, lower extremity swelling or claudication.   Denies jaw pain or shoulder pain that comes on with exertion.  Since reducing the lasix, denies any issues lying flat, denies issues breathing while lying flat.  Denies swelling in the lower extremity or other symptoms.  Last 3 blood pressure readings in our office are as follows: BP Readings from Last 3 Encounters:   01/24/18 (!) 183/113  12/13/17 (!) 142/87  11/23/17 (!) 160/90    Pulse Readings from Last 3 Encounters:  01/24/18 66  12/13/17 (!) 110  11/23/17 (!) 115    Filed Weights   01/24/18 0846  Weight: 156 lb 14.4 oz (71.2 kg)   1. 72 y.o. male here for cholesterol follow-up.   - Patient reports good compliance with medications or treatment plan.  Taking Lipitor every night before bed.  - Denies medication S-E.  Denies new aches and pains.   - Smoking Status noted   - He denies new onset of: chest pain, exercise intolerance, shortness of breath, dizziness, visual changes, headache, lower extremity swelling or claudication. No new myalgias.  The cholesterol last visit was:  Lab Results  Component Value Date   CHOL 260 (H) 12/11/2017   HDL 56 12/11/2017   LDLCALC 180 (H) 12/11/2017   TRIG 118 12/11/2017   CHOLHDL 4.6 12/11/2017    Hepatic Function Latest Ref Rng & Units 12/11/2017 09/18/2017 09/17/2017  Total Protein 6.0 - 8.5 g/dL 6.8 6.0(L) 7.3  Albumin 3.5 - 4.8 g/dL 4.5 3.0(L) 3.8  AST 0 - 40 IU/L 22 53(H) 33  ALT 0 - 44 IU/L 17 48 19  Alk Phosphatase 39 - 117 IU/L 112 57 77  Total Bilirubin 0.0 - 1.2 mg/dL 0.2 0.9 1.8(H)  Bilirubin, Direct 0.1 - 0.5 mg/dL - 0.2 -     Patient Care Team    Relationship Specialty Notifications Start End  [name], DO PCP - General Family Medicine  10/22/17   Marshell Garfinkel, MD Consulting Physician Pulmonary Disease  11/06/17      Lab Results  Component Value Date   CREATININE 1.09 12/11/2017   BUN 18 12/11/2017   NA 143 12/11/2017   K 4.1 12/11/2017   CL 99 12/11/2017   CO2 28 12/11/2017    Lab Results  Component Value Date   CHOL 260 (H) 12/11/2017   CHOL 127 07/15/2017    Lab Results  Component Value Date   HDL 56 12/11/2017   HDL 34 (L) 07/15/2017    Lab Results  Component Value Date   LDLCALC 180 (H) 12/11/2017   LDLCALC 79 07/15/2017    Lab Results  Component Value Date   TRIG 118 12/11/2017   TRIG  72 07/15/2017    Lab Results  Component Value Date   CHOLHDL 4.6 12/11/2017   CHOLHDL 3.7  07/15/2017    No results found for: LDLDIRECT ===================================================================   Patient Active Problem List   Diagnosis Date Noted  . Hyperlipidemia 12/13/2017  . Vitamin D insufficiency 12/13/2017  . Physical deconditioning 11/15/2017  . Hypertension 11/06/2017  . Stopped smoking 2008 with greater than 40 pack year history 11/06/2017  . Chronic fatigue 11/06/2017  . Dyspnea on minimal exertion-likely due to COPD 11/06/2017  . Acute respiratory failure with hypoxia ([place]) 09/17/2017  . CAP (community acquired pneumonia) 07/14/2017  . COPD, group D, by GOLD 2017 classification ([place]) 07/14/2017  . Heart failure ([place]) 07/14/2017     Past Medical History:  Diagnosis Date  . CHF (congestive heart failure) ([place])   . COPD (chronic obstructive pulmonary disease) ([place])   . Hypertension      No past surgical history on file.   Family History  Problem Relation Age of Onset  . CAD Mother 79  . Hypertension Mother   . Stroke Mother   . COPD Father 23  . AAA (abdominal aortic aneurysm) Father   . COPD Brother   . COPD Brother   . Cancer Neg Hx      Social History   Substance and Sexual Activity  Drug Use No  ,  Social History   Substance and Sexual Activity  Alcohol Use No  . Frequency: Never  ,  Social History   Tobacco Use  Smoking Status Former Smoker  . Packs/day: 1.00  . Years: 40.00  . Pack years: 40.00  . Last attempt to quit: 2008  . Years since quitting: 11.5  Smokeless Tobacco Former Systems developer  ,    Current Outpatient Medications on File Prior to Visit  Medication Sig Dispense Refill  . albuterol (PROVENTIL HFA;VENTOLIN HFA) 108 (90 Base) MCG/ACT inhaler Inhale 2 puffs into the lungs every 4 (four) hours as needed for wheezing or shortness of breath. 1 Inhaler 2  . Cholecalciferol (VITAMIN D3) 5000 units TABS 5,000 IU OTC  vitamin D3 daily. 90 tablet 3  . Fluticasone-Umeclidin-Vilant (TRELEGY ELLIPTA) 100-62.5-25 MCG/INH AEPB Inhale 1 puff into the lungs daily. 60 each 5  . metoprolol succinate (TOPROL-XL) 50 MG 24 hr tablet Take 1 tablet (50 mg total) by mouth daily. Take with or immediately following a meal. 90 tablet 3   No current facility-administered medications on file prior to visit.      No Known Allergies   Review of Systems:   General:  Denies fever, chills Optho/Auditory:   Denies visual changes, blurred vision Respiratory:   Denies SOB, cough, wheeze, DIB  Cardiovascular:   Denies chest pain, palpitations, painful respirations Gastrointestinal:   Denies nausea, vomiting, diarrhea.  Endocrine:     Denies new hot or cold intolerance Musculoskeletal:  Denies joint swelling, gait issues, or new unexplained myalgias/ arthralgias Skin:  Denies rash, suspicious lesions  Neurological:    Denies dizziness, unexplained weakness, numbness  Psychiatric/Behavioral:   Denies mood changes  Objective:    Blood pressure (!) 183/113, pulse 66, height 6' (1.829 m), weight 156 lb 14.4 oz (71.2 kg), SpO2 96 %.  Body mass index is 21.28 kg/m.  General: Nasal cannula for oxygen in place.  Well Developed, well nourished, and in no acute distress.  HEENT: Normocephalic, atraumatic, pupils equal round reactive to light, neck supple, No carotid bruits, no JVD Skin: Warm and dry, cap RF less 2 sec Cardiac: Regular rate and rhythm, S1, S2 WNL's, no murmurs rubs or gallops Respiratory: Globally decreased aeration but no rales, rhonchi, or  wheezes.  ECTA B/L, Not using accessory muscles, speaking in full sentences. NeuroM-Sk: Ambulates w/o assistance, moves ext * 4 w/o difficulty, sensation grossly intact.  Ext: scant edema b/l lower ext Psych: No HI/SI, judgement and insight good, Euthymic mood. Full Affect.

## 2018-01-24 NOTE — Patient Instructions (Addendum)
We are doubling your Lipitor from 40 to 80 mg nightly.  Also we are putting it back up to your original dose of Lasix which is 1 tablet daily (not a half)  Additionally we are going to take you off your plain amlodipine tablet and give you a combination of olmesartan-amlodipine-hydrochlorothiazide tablet that you take 1 daily.  Please continue to monitor your blood pressure.  If after 1 week you do okay on the half a tablet of this new medicine and your blood pressure is still not at goal of 140/90 or less, then go to 1 full tablet after a week.  Please write down all your blood pressures and your pulses and bring in next office visit.      How to Take Your Blood Pressure Blood pressure is a measurement of how strongly your blood is pressing against the walls of your arteries. Arteries are blood vessels that carry blood from your heart throughout your body. Your health care provider takes your blood pressure at each office visit. You can also take your own blood pressure at home with a blood pressure machine. You may need to take your own blood pressure:  To confirm a diagnosis of high blood pressure (hypertension).  To monitor your blood pressure over time.  To make sure your blood pressure medicine is working.  Supplies needed: To take your blood pressure, you will need a blood pressure machine. You can buy a blood pressure machine, or blood pressure monitor, at most drugstores or online. There are several types of home blood pressure monitors. When choosing one, consider the following:  Choose a monitor that has an arm cuff.  Choose a monitor that wraps snugly around your upper arm. You should be able to fit only one finger between your arm and the cuff.  Do not choose a monitor that measures your blood pressure from your wrist or finger.  Your health care provider can suggest a reliable monitor that will meet your needs. How to prepare To get the most accurate reading, avoid the  following for 30 minutes before you check your blood pressure:  Drinking caffeine.  Drinking alcohol.  Eating.  Smoking.  Exercising.  Five minutes before you check your blood pressure:  Empty your bladder.  Sit quietly without talking in a dining chair, rather than in a soft couch or armchair.  How to take your blood pressure To check your blood pressure, follow the instructions in the manual that came with your blood pressure monitor. If you have a digital blood pressure monitor, the instructions may be as follows: 1. Sit up straight. 2. Place your feet on the floor. Do not cross your ankles or legs. 3. Rest your left arm at the level of your heart on a table or desk or on the arm of a chair. 4. Pull up your shirt sleeve. 5. Wrap the blood pressure cuff around the upper part of your left arm, 1 inch (2.5 cm) above your elbow. It is best to wrap the cuff around bare skin. 6. Fit the cuff snugly around your arm. You should be able to place only one finger between the cuff and your arm. 7. Position the cord inside the groove of your elbow. 8. Press the power button. 9. Sit quietly while the cuff inflates and deflates. 10. Read the digital reading on the monitor screen and write it down (record it). 11. Wait 2-3 minutes, then repeat the steps, starting at step 1.  What does my  blood pressure reading mean? A blood pressure reading consists of a higher number over a lower number. Ideally, your blood pressure should be below 120/80. The first ("top") number is called the systolic pressure. It is a measure of the pressure in your arteries as your heart beats. The second ("bottom") number is called the diastolic pressure. It is a measure of the pressure in your arteries as the heart relaxes. Blood pressure is classified into four stages. The following are the stages for adults who do not have a short-term serious illness or a chronic condition. Systolic pressure and diastolic pressure are  measured in a unit called mm Hg. Normal  Systolic pressure: below 638.  Diastolic pressure: below 80. Elevated  Systolic pressure: 756-433.  Diastolic pressure: below 80. Hypertension stage 1  Systolic pressure: 295-188.  Diastolic pressure: 41-66. Hypertension stage 2  Systolic pressure: 063 or above.  Diastolic pressure: 90 or above. You can have prehypertension or hypertension even if only the systolic or only the diastolic number in your reading is higher than normal. Follow these instructions at home:  Check your blood pressure as often as recommended by your health care provider.  Take your monitor to the next appointment with your health care provider to make sure: ? That you are using it correctly. ? That it provides accurate readings.  Be sure you understand what your goal blood pressure numbers are.  Tell your health care provider if you are having any side effects from blood pressure medicine. Contact a health care provider if:  Your blood pressure is consistently high. Get help right away if:  Your systolic blood pressure is higher than 180.  Your diastolic blood pressure is higher than 110. This information is not intended to replace advice given to you by your health care provider. Make sure you discuss any questions you have with your health care provider. Document Released: 12/10/2015 Document Revised: 02/22/2016 Document Reviewed: 12/10/2015 Elsevier Interactive Patient Education  2018 [employer].     Hypertension Hypertension, commonly called high blood pressure, is when the force of blood pumping through the arteries is too strong. The arteries are the blood vessels that carry blood from the heart throughout the body. Hypertension forces the heart to work harder to pump blood and may cause arteries to become narrow or stiff. Having untreated or uncontrolled hypertension can cause heart attacks, strokes, kidney disease, and other problems. A  blood pressure reading consists of a higher number over a lower number. Ideally, your blood pressure should be below 120/80. The first ("top") number is called the systolic pressure. It is a measure of the pressure in your arteries as your heart beats. The second ("bottom") number is called the diastolic pressure. It is a measure of the pressure in your arteries as the heart relaxes. What are the causes? The cause of this condition is not known. What increases the risk? Some risk factors for high blood pressure are under your control. Others are not. Factors you can change  Smoking.  Having type 2 diabetes mellitus, high cholesterol, or both.  Not getting enough exercise or physical activity.  Being overweight.  Having too much fat, sugar, calories, or salt (sodium) in your diet.  Drinking too much alcohol. Factors that are difficult or impossible to change  Having chronic kidney disease.  Having a family history of high blood pressure.  Age. Risk increases with age.  Race. You may be at higher risk if you are African-American.  Gender. Men are  at higher risk than women before age 89. After age 98, women are at higher risk than men.  Having obstructive sleep apnea.  Stress. What are the signs or symptoms? Extremely high blood pressure (hypertensive crisis) may cause:  Headache.  Anxiety.  Shortness of breath.  Nosebleed.  Nausea and vomiting.  Severe chest pain.  Jerky movements you cannot control (seizures).  How is this diagnosed? This condition is diagnosed by measuring your blood pressure while you are seated, with your arm resting on a surface. The cuff of the blood pressure monitor will be placed directly against the skin of your upper arm at the level of your heart. It should be measured at least twice using the same arm. Certain conditions can cause a difference in blood pressure between your right and left arms. Certain factors can cause blood pressure  readings to be lower or higher than normal (elevated) for a short period of time:  When your blood pressure is higher when you are in a health care provider's office than when you are at home, this is called white coat hypertension. Most people with this condition do not need medicines.  When your blood pressure is higher at home than when you are in a health care provider's office, this is called masked hypertension. Most people with this condition may need medicines to control blood pressure.  If you have a high blood pressure reading during one visit or you have normal blood pressure with other risk factors:  You may be asked to return on a different day to have your blood pressure checked again.  You may be asked to monitor your blood pressure at home for 1 week or longer.  If you are diagnosed with hypertension, you may have other blood or imaging tests to help your health care provider understand your overall risk for other conditions. How is this treated? This condition is treated by making healthy lifestyle changes, such as eating healthy foods, exercising more, and reducing your alcohol intake. Your health care provider may prescribe medicine if lifestyle changes are not enough to get your blood pressure under control, and if:  Your systolic blood pressure is above 130.  Your diastolic blood pressure is above 80.  Your personal target blood pressure may vary depending on your medical conditions, your age, and other factors. Follow these instructions at home: Eating and drinking  Eat a diet that is high in fiber and potassium, and low in sodium, added sugar, and fat. An example eating plan is called the DASH (Dietary Approaches to Stop Hypertension) diet. To eat this way: ? Eat plenty of fresh fruits and vegetables. Try to fill half of your plate at each meal with fruits and vegetables. ? Eat whole grains, such as whole wheat pasta, Silvestri rice, or whole grain bread. Fill about one  quarter of your plate with whole grains. ? Eat or drink low-fat dairy products, such as skim milk or low-fat yogurt. ? Avoid fatty cuts of meat, processed or cured meats, and poultry with skin. Fill about one quarter of your plate with lean proteins, such as fish, chicken without skin, beans, eggs, and tofu. ? Avoid premade and processed foods. These tend to be higher in sodium, added sugar, and fat.  Reduce your daily sodium intake. Most people with hypertension should eat less than 1,500 mg of sodium a day.  Limit alcohol intake to no more than 1 drink a day for nonpregnant women and 2 drinks a day for men.  One drink equals 12 oz of beer, 5 oz of wine, or 1 oz of hard liquor. Lifestyle  Work with your health care provider to maintain a healthy body weight or to lose weight. Ask what an ideal weight is for you.  Get at least 30 minutes of exercise that causes your heart to beat faster (aerobic exercise) most days of the week. Activities may include walking, swimming, or biking.  Include exercise to strengthen your muscles (resistance exercise), such as pilates or lifting weights, as part of your weekly exercise routine. Try to do these types of exercises for 30 minutes at least 3 days a week.  Do not use any products that contain nicotine or tobacco, such as cigarettes and e-cigarettes. If you need help quitting, ask your health care provider.  Monitor your blood pressure at home as told by your health care provider.  Keep all follow-up visits as told by your health care provider. This is important. Medicines  Take over-the-counter and prescription medicines only as told by your health care provider. Follow directions carefully. Blood pressure medicines must be taken as prescribed.  Do not skip doses of blood pressure medicine. Doing this puts you at risk for problems and can make the medicine less effective.  Ask your health care provider about side effects or reactions to medicines  that you should watch for. Contact a health care provider if:  You think you are having a reaction to a medicine you are taking.  You have headaches that keep coming back (recurring).  You feel dizzy.  You have swelling in your ankles.  You have trouble with your vision. Get help right away if:  You develop a severe headache or confusion.  You have unusual weakness or numbness.  You feel faint.  You have severe pain in your chest or abdomen.  You vomit repeatedly.  You have trouble breathing. Summary  Hypertension is when the force of blood pumping through your arteries is too strong. If this condition is not controlled, it may put you at risk for serious complications.  Your personal target blood pressure may vary depending on your medical conditions, your age, and other factors. For most people, a normal blood pressure is less than 120/80.  Hypertension is treated with lifestyle changes, medicines, or a combination of both. Lifestyle changes include weight loss, eating a healthy, low-sodium diet, exercising more, and limiting alcohol. This information is not intended to replace advice given to you by your health care provider. Make sure you discuss any questions you have with your health care provider. Document Released: 07/03/2005 Document Revised: 05/31/2016 Document Reviewed: 05/31/2016 Elsevier Interactive Patient Education  [employer].

## 2018-03-12 ENCOUNTER — Ambulatory Visit (INDEPENDENT_AMBULATORY_CARE_PROVIDER_SITE_OTHER): Payer: Medicare Other | Admitting: Family Medicine

## 2018-03-12 ENCOUNTER — Encounter: Payer: Self-pay | Admitting: Family Medicine

## 2018-03-12 VITALS — BP 119/79 | HR 97 | Ht 72.0 in | Wt 160.0 lb

## 2018-03-12 DIAGNOSIS — I5032 Chronic diastolic (congestive) heart failure: Secondary | ICD-10-CM | POA: Diagnosis not present

## 2018-03-12 DIAGNOSIS — E785 Hyperlipidemia, unspecified: Secondary | ICD-10-CM | POA: Diagnosis not present

## 2018-03-12 DIAGNOSIS — J449 Chronic obstructive pulmonary disease, unspecified: Secondary | ICD-10-CM

## 2018-03-12 DIAGNOSIS — I10 Essential (primary) hypertension: Secondary | ICD-10-CM

## 2018-03-12 MED ORDER — FUROSEMIDE 20 MG PO TABS: 10.0000 mg | ORAL_TABLET | Freq: Every day | ORAL | 1 refills | Status: DC

## 2018-03-12 NOTE — Patient Instructions (Signed)
We decreased your Lasix back to 1/2 tablet daily.  Please continue to monitor your blood pressure and write it down.  Asked Melissa for a new log which she can give you today. -Per your request we will see you in 4 weeks for an office visit to go over how you are doing with your blood pressures with the decrease in Lasix dose.  If you have any problems or concerns before then, please do not hesitate to reach out to [place] for further advice!   Check out the DASH diet = 1.5 Gram Low Sodium Diet   A 1.5 gram sodium diet restricts the amount of sodium in the diet to no more than 1.5 g or 1500 mg daily.  The American Heart Association recommends Americans over the age of 18 to consume no more than 1500 mg of sodium each day to reduce the risk of developing high blood pressure.  Research also shows that limiting sodium may reduce heart attack and stroke risk.  Many foods contain sodium for flavor and sometimes as a preservative.  When the amount of sodium in a diet needs to be low, it is important to know what to look for when choosing foods and drinks.  The following includes some information and guidelines to help make it easier for you to adapt to a low sodium diet.    QUICK TIPS  Do not add salt to food.  Avoid convenience items and fast food.  Choose unsalted snack foods.  Buy lower sodium products, often labeled as "lower sodium" or "no salt added."  Check food labels to learn how much sodium is in 1 serving.  When eating at a restaurant, ask that your food be prepared with less salt or none, if possible.    READING FOOD LABELS FOR SODIUM INFORMATION  The nutrition facts label is a good place to find how much sodium is in foods. Look for products with no more than 400 mg of sodium per serving.  Remember that 1.5 g = 1500 mg.  The food label may also list foods as:  Sodium-free: Less than 5 mg in a serving.  Very low sodium: 35 mg or less in a serving.  Low-sodium: 140 mg or less in a serving.    Light in sodium: 50% less sodium in a serving. For example, if a food that usually has 300 mg of sodium is changed to become light in sodium, it will have 150 mg of sodium.  Reduced sodium: 25% less sodium in a serving. For example, if a food that usually has 400 mg of sodium is changed to reduced sodium, it will have 300 mg of sodium.    CHOOSING FOODS  Grains  Avoid: Salted crackers and snack items. Some cereals, including instant hot cereals. Bread stuffing and biscuit mixes. Seasoned rice or pasta mixes.  Choose: Unsalted snack items. Low-sodium cereals, oats, puffed wheat and rice, shredded wheat. English muffins and bread. Pasta.  Meats  Avoid: Salted, canned, smoked, spiced, pickled meats, including fish and poultry. Bacon, ham, sausage, cold cuts, hot dogs, anchovies.  Choose: Low-sodium canned tuna and salmon. Fresh or frozen meat, poultry, and fish.  Dairy  Avoid: Processed cheese and spreads. Cottage cheese. Buttermilk and condensed milk. Regular cheese.  Choose: Milk. Low-sodium cottage cheese. Yogurt. Sour cream. Low-sodium cheese.  Fruits and Vegetables  Avoid: Regular canned vegetables. Regular canned tomato sauce and paste. Frozen vegetables in sauces. Olives. Angie Fava. Relishes. Sauerkraut.  Choose: Low-sodium canned vegetables. Low-sodium tomato  sauce and paste. Frozen or fresh vegetables. Fresh and frozen fruit.  Condiments  Avoid: Canned and packaged gravies. Worcestershire sauce. Tartar sauce. Barbecue sauce. Soy sauce. Steak sauce. Ketchup. Onion, garlic, and table salt. Meat flavorings and tenderizers.  Choose: Fresh and dried herbs and spices. Low-sodium varieties of mustard and ketchup. Lemon juice. Tabasco sauce. Horseradish.    SAMPLE 1.5 GRAM SODIUM MEAL PLAN:   Breakfast / Sodium (mg)  1 cup low-fat milk / 143 mg  1 whole-wheat English muffin / 240 mg  1 tbs heart-healthy margarine / 153 mg  1 hard-boiled egg / 139 mg  1 small orange / 0 mg  Lunch / Sodium  (mg)  1 cup raw carrots / 76 mg  2 tbs no salt added peanut butter / 5 mg  2 slices whole-wheat bread / 270 mg  1 tbs jelly / 6 mg   cup red grapes / 2 mg  Dinner / Sodium (mg)  1 cup whole-wheat pasta / 2 mg  1 cup low-sodium tomato sauce / 73 mg  3 oz lean ground beef / 57 mg  1 small side salad (1 cup raw spinach leaves,  cup cucumber,  cup yellow bell pepper) with 1 tsp olive oil and 1 tsp red wine vinegar / 25 mg  Snack / Sodium (mg)  1 container low-fat vanilla yogurt / 107 mg  3 graham cracker squares / 127 mg  Nutrient Analysis  Calories: 1745  Protein: 75 g  Carbohydrate: 237 g  Fat: 57 g  Sodium: 1425 mg  Document Released: 07/03/2005 Document Revised: 03/15/2011 Document Reviewed: 10/04/2009  [facility] Patient Information 2012 [place], [place].    Hypertension Hypertension, commonly called high blood pressure, is when the force of blood pumping through the arteries is too strong. The arteries are the blood vessels that carry blood from the heart throughout the body. Hypertension forces the heart to work harder to pump blood and may cause arteries to become narrow or stiff. Having untreated or uncontrolled hypertension can cause heart attacks, strokes, kidney disease, and other problems. A blood pressure reading consists of a higher number over a lower number. Ideally, your blood pressure should be below 120/80. The first ("top") number is called the systolic pressure. It is a measure of the pressure in your arteries as your heart beats. The second ("bottom") number is called the diastolic pressure. It is a measure of the pressure in your arteries as the heart relaxes. What are the causes? The cause of this condition is not known. What increases the risk? Some risk factors for high blood pressure are under your control. Others are not. Factors you can change  Smoking.  Having type 2 diabetes mellitus, high cholesterol, or both.  Not getting enough exercise or physical  activity.  Being overweight.  Having too much fat, sugar, calories, or salt (sodium) in your diet.  Drinking too much alcohol. Factors that are difficult or impossible to change  Having chronic kidney disease.  Having a family history of high blood pressure.  Age. Risk increases with age.  Race. You may be at higher risk if you are African-American.  Gender. Men are at higher risk than women before age 37. After age 76, women are at higher risk than men.  Having obstructive sleep apnea.  Stress. What are the signs or symptoms? Extremely high blood pressure (hypertensive crisis) may cause:  Headache.  Anxiety.  Shortness of breath.  Nosebleed.  Nausea and vomiting.  Severe chest pain.  Jerky movements you cannot control (seizures).  How is this diagnosed? This condition is diagnosed by measuring your blood pressure while you are seated, with your arm resting on a surface. The cuff of the blood pressure monitor will be placed directly against the skin of your upper arm at the level of your heart. It should be measured at least twice using the same arm. Certain conditions can cause a difference in blood pressure between your right and left arms. Certain factors can cause blood pressure readings to be lower or higher than normal (elevated) for a short period of time:  When your blood pressure is higher when you are in a health care provider's office than when you are at home, this is called white coat hypertension. Most people with this condition do not need medicines.  When your blood pressure is higher at home than when you are in a health care provider's office, this is called masked hypertension. Most people with this condition may need medicines to control blood pressure.  If you have a high blood pressure reading during one visit or you have normal blood pressure with other risk factors:  You may be asked to return on a different day to have your blood pressure  checked again.  You may be asked to monitor your blood pressure at home for 1 week or longer.  If you are diagnosed with hypertension, you may have other blood or imaging tests to help your health care provider understand your overall risk for other conditions. How is this treated? This condition is treated by making healthy lifestyle changes, such as eating healthy foods, exercising more, and reducing your alcohol intake. Your health care provider may prescribe medicine if lifestyle changes are not enough to get your blood pressure under control, and if:  Your systolic blood pressure is above 130.  Your diastolic blood pressure is above 80.  Your personal target blood pressure may vary depending on your medical conditions, your age, and other factors. Follow these instructions at home: Eating and drinking  Eat a diet that is high in fiber and potassium, and low in sodium, added sugar, and fat. An example eating plan is called the DASH (Dietary Approaches to Stop Hypertension) diet. To eat this way: ? Eat plenty of fresh fruits and vegetables. Try to fill half of your plate at each meal with fruits and vegetables. ? Eat whole grains, such as whole wheat pasta, [place] rice, or whole grain bread. Fill about one quarter of your plate with whole grains. ? Eat or drink low-fat dairy products, such as skim milk or low-fat yogurt. ? Avoid fatty cuts of meat, processed or cured meats, and poultry with skin. Fill about one quarter of your plate with lean proteins, such as fish, chicken without skin, beans, eggs, and tofu. ? Avoid premade and processed foods. These tend to be higher in sodium, added sugar, and fat.  Reduce your daily sodium intake. Most people with hypertension should eat less than 1,500 mg of sodium a day.  Limit alcohol intake to no more than 1 drink a day for nonpregnant women and 2 drinks a day for men. One drink equals 12 oz of beer, 5 oz of wine, or 1 oz of hard  liquor. Lifestyle  Work with your health care provider to maintain a healthy body weight or to lose weight. Ask what an ideal weight is for you.  Get at least 30 minutes of exercise that causes your heart to beat faster (  aerobic exercise) most days of the week. Activities may include walking, swimming, or biking.  Include exercise to strengthen your muscles (resistance exercise), such as pilates or lifting weights, as part of your weekly exercise routine. Try to do these types of exercises for 30 minutes at least 3 days a week.  Do not use any products that contain nicotine or tobacco, such as cigarettes and e-cigarettes. If you need help quitting, ask your health care provider.  Monitor your blood pressure at home as told by your health care provider.  Keep all follow-up visits as told by your health care provider. This is important. Medicines  Take over-the-counter and prescription medicines only as told by your health care provider. Follow directions carefully. Blood pressure medicines must be taken as prescribed.  Do not skip doses of blood pressure medicine. Doing this puts you at risk for problems and can make the medicine less effective.  Ask your health care provider about side effects or reactions to medicines that you should watch for. Contact a health care provider if:  You think you are having a reaction to a medicine you are taking.  You have headaches that keep coming back (recurring).  You feel dizzy.  You have swelling in your ankles.  You have trouble with your vision. Get help right away if:  You develop a severe headache or confusion.  You have unusual weakness or numbness.  You feel faint.  You have severe pain in your chest or abdomen.  You vomit repeatedly.  You have trouble breathing. Summary  Hypertension is when the force of blood pumping through your arteries is too strong. If this condition is not controlled, it may put you at risk for serious  complications.  Your personal target blood pressure may vary depending on your medical conditions, your age, and other factors. For most people, a normal blood pressure is less than 120/80.  Hypertension is treated with lifestyle changes, medicines, or a combination of both. Lifestyle changes include weight loss, eating a healthy, low-sodium diet, exercising more, and limiting alcohol. This information is not intended to replace advice given to you by your health care provider. Make sure you discuss any questions you have with your health care provider. Document Released: 07/03/2005 Document Revised: 05/31/2016 Document Reviewed: 05/31/2016 Elsevier Interactive Patient Education  [employer].

## 2018-03-12 NOTE — Progress Notes (Signed)
Impression and Recommendations:    1. Essential hypertension- poorly controlled   2. Hyperlipidemia, unspecified hyperlipidemia type   3. Chronic diastolic heart failure ([place])   4. Chronic obstructive pulmonary disease, unspecified COPD type ([place])     1. BP too low at home, will decrease Lasix to half.  Will check CMP since last OV we doubled his Lasix.  Continue on combination of olmesartan-amlodipine-HCTZ for now at same dose.  Will consider decreasing to half dose in future if continues to be too low.  Did discuss DASH diet and low-salt diet. 2. Patient will continue his Lipitor which she is tolerating well.  Will check CMP today; will check fasting lipid profile 4 months. 3. Increase Lasix did not affect his breathing quality one bit last time.  CHF symptoms stable. 4. COPD-stable.  Still remains on oxygen.  No complaints.   Education and routine counseling performed. Handouts provided.  Orders Placed This Encounter  Procedures  . Comprehensive metabolic panel    Meds ordered this encounter  Medications  . furosemide (LASIX) 20 MG tablet    Sig: Take 0.5 tablets (10 mg total) by mouth daily.    Dispense:  45 tablet    Refill:  1    Medications Discontinued During This Encounter  Medication Reason  . furosemide (LASIX) 20 MG tablet      The patient was counseled, risk factors were discussed, anticipatory guidance given.  Gross side effects, risk and benefits, and alternatives of medications discussed with patient.  Patient is aware that all medications have potential side effects and we are unable to predict every side effect or drug-drug interaction that may occur.  Expresses verbal understanding and consents to current therapy plan and treatment regimen.  Return for 6wks- f/up BP since dec lasix- bring BP log (15); 40mo repeat FLP.  Please see AVS handed out to patient at the end of our visit for further patient instructions/ counseling done pertaining to  today's office visit.    Note:  This document was prepared using Dragon voice recognition software and may include unintentional dictation errors.    DMellody Dance08/27/19 9:24 AM   Subjective:    HPI: George Frazier a 72y.o. [facility].    -Patient was last seen on 01/24/2018 and blood pressure at that time was extremely poorly controlled with rechecks being 183/113 and a pulse of 66.  At that time we increased patient's Lasix from 10 mg to 20 mg.  Also we took patient off his plain amlodipine and prescribed combination of olmesartan, amlodipine, hydrochlorothiazide last time.  He is here for 6wk f/up ov- changed BP meds, inc lipitor and lasix, will need CMP.  Of note he was told he must follow-up with his cardiologist for his diastolic heart failure and regular evaluation.  He has not done so yet.  HTN:  -  His blood pressure has been controlled at home, however may be a little too low.  Blood pressures are running anywhere from 130s over 80s with the lowest being 88/62.  Over the past couple weeks he is been averaging around 102/62.  He denies any dizziness but does state he gets a little lightheaded if he gets up too quick and also feels a little more tired than usual.    He denies any new breathing problems and states the increase Lasix did not do anything  to help his breathing.  He feels no different on it  - Patient reports good compliance with blood pressure medications  - Denies medication S-E   - Smoking Status noted   - He denies new onset of: chest pain, exercise intolerance, shortness of breath, dizziness, visual changes, headache, lower extremity swelling or claudication.    Last 3 blood pressure readings in our office are as follows: BP Readings from Last 3 Encounters:  03/12/18 119/79  01/24/18 (!) 183/113  12/13/17 (!) 142/87    Pulse Readings from Last 3 Encounters:    03/12/18 97  01/24/18 66  12/13/17 (!) 110     CHOL HPI:   -Last office visit we also started patient on Lipitor 80 mg nightly.  He is here today to recheck his ALT and see how he is doing on the new medicine.  Txmnt compliance- good  Patient reports very little compliance with low chol/ saturated and trans fat diet.  No exercise due to severe COPD.  RUQ pain-none  No complaints of muscle aches or new arthralgias.  No other s-e  Last lipid panel as follows:  Lab Results  Component Value Date   CHOL 260 (H) 12/11/2017   HDL 56 12/11/2017   LDLCALC 180 (H) 12/11/2017   TRIG 118 12/11/2017   CHOLHDL 4.6 12/11/2017    Hepatic Function Latest Ref Rng & Units 12/11/2017 09/18/2017 09/17/2017  Total Protein 6.0 - 8.5 g/dL 6.8 6.0(L) 7.3  Albumin 3.5 - 4.8 g/dL 4.5 3.0(L) 3.8  AST 0 - 40 IU/L 22 53(H) 33  ALT 0 - 44 IU/L 17 48 19  Alk Phosphatase 39 - 117 IU/L 112 57 77  Total Bilirubin 0.0 - 1.2 mg/dL 0.2 0.9 1.8(H)  Bilirubin, Direct 0.1 - 0.5 mg/dL - 0.2 -        Filed Weights   03/12/18 0859  Weight: 160 lb (72.6 kg)    Patient Care Team    Relationship Specialty Notifications Start End  [name], DO PCP - General Family Medicine  10/22/17   Marshell Garfinkel, MD Consulting Physician Pulmonary Disease  11/06/17     Lab Results  Component Value Date   CREATININE 1.09 12/11/2017   BUN 18 12/11/2017   NA 143 12/11/2017   K 4.1 12/11/2017   CL 99 12/11/2017   CO2 28 12/11/2017    Lab Results  Component Value Date   CHOL 260 (H) 12/11/2017   CHOL 127 07/15/2017    Lab Results  Component Value Date   HDL 56 12/11/2017   HDL 34 (L) 07/15/2017    Lab Results  Component Value Date   LDLCALC 180 (H) 12/11/2017   LDLCALC 79 07/15/2017    Lab Results  Component Value Date   TRIG 118 12/11/2017   TRIG 72 07/15/2017    Lab Results  Component Value Date   CHOLHDL 4.6 12/11/2017   CHOLHDL 3.7 07/15/2017    No results found for:  LDLDIRECT ===================================================================   Patient Active Problem List   Diagnosis Date Noted  . Hyperlipidemia 12/13/2017  . Vitamin D insufficiency 12/13/2017  . Physical deconditioning 11/15/2017  . Hypertension 11/06/2017  . Stopped smoking 2008 with greater than 40 pack year history 11/06/2017  . Chronic fatigue 11/06/2017  . Dyspnea on minimal exertion-likely due to COPD 11/06/2017  . Acute respiratory failure with hypoxia ([place]) 09/17/2017  . CAP (community acquired pneumonia) 07/14/2017  . COPD, group D, by GOLD 2017 classification ([place]) 07/14/2017  . Heart failure (  [place]) 07/14/2017     Past Medical History:  Diagnosis Date  . CHF (congestive heart failure) ([place])   . COPD (chronic obstructive pulmonary disease) ([place])   . Hypertension      No past surgical history on file.   Family History  Problem Relation Age of Onset  . CAD Mother 42  . Hypertension Mother   . Stroke Mother   . COPD Father 2  . AAA (abdominal aortic aneurysm) Father   . COPD Brother   . COPD Brother   . Cancer Neg Hx      Social History   Substance and Sexual Activity  Drug Use No  ,  Social History   Substance and Sexual Activity  Alcohol Use No  . Frequency: Never  ,  Social History   Tobacco Use  Smoking Status Former Smoker  . Packs/day: 1.00  . Years: 40.00  . Pack years: 40.00  . Last attempt to quit: 2008  . Years since quitting: 11.6  Smokeless Tobacco Former Systems developer  ,    Current Outpatient Medications on File Prior to Visit  Medication Sig Dispense Refill  . albuterol (PROVENTIL HFA;VENTOLIN HFA) 108 (90 Base) MCG/ACT inhaler Inhale 2 puffs into the lungs every 4 (four) hours as needed for wheezing or shortness of breath. 1 Inhaler 2  . atorvastatin (LIPITOR) 80 MG tablet 1 tablet before bed daily 90 tablet 3  . Cholecalciferol (VITAMIN D3) 5000 units TABS 5,000 IU OTC vitamin D3 daily. 90 tablet 3  .  Fluticasone-Umeclidin-Vilant (TRELEGY ELLIPTA) 100-62.5-25 MCG/INH AEPB Inhale 1 puff into the lungs daily. 60 each 5  . metoprolol succinate (TOPROL-XL) 50 MG 24 hr tablet Take 1 tablet (50 mg total) by mouth daily. Take with or immediately following a meal. 90 tablet 3  . Olmesartan-amLODIPine-HCTZ 20-5-12.5 MG TABS 0.5tab QD for 1 week, then 1 tab po qd 90 tablet 0   No current facility-administered medications on file prior to visit.      No Known Allergies   Review of Systems:   General:  Denies fever, chills Optho/Auditory:   Denies visual changes, blurred vision Respiratory:   Denies SOB, cough, wheeze, DIB  Cardiovascular:   Denies chest pain, palpitations, painful respirations Gastrointestinal:   Denies nausea, vomiting, diarrhea.  Endocrine:     Denies new hot or cold intolerance Musculoskeletal:  Denies joint swelling, gait issues, or new unexplained myalgias/ arthralgias Skin:  Denies rash, suspicious lesions  Neurological:    Denies dizziness, unexplained weakness, numbness  Psychiatric/Behavioral:   Denies mood changes  Objective:    Blood pressure 119/79, pulse 97, height 6' (1.829 m), weight 160 lb (72.6 kg), SpO2 96 %.  Body mass index is 21.7 kg/m.  General: Well Developed, well nourished, and in no acute distress.  HEENT: Normocephalic, atraumatic, pupils equal round reactive to light, neck supple, No carotid bruits, no JVD Skin: Warm and dry, cap RF less 2 sec Cardiac: Regular rate and rhythm, S1, S2 WNL's  Respiratory: ECTA B/L-but increase exhalation phase and very distant breath sounds, Not using accessory muscles, speaking in relatively full sentences, has O2 on. NeuroM-Sk: Ambulates w/o assistance, moves ext * 4 w/o difficulty, sensation grossly intact.  Ext: scant edema b/l lower ext Psych: No HI/SI, judgement and insight good, Euthymic mood. Full Affect.

## 2018-03-13 LAB — COMPREHENSIVE METABOLIC PANEL
ALBUMIN: 4.1 g/dL (ref 3.5–4.8)
ALK PHOS: 123 IU/L — AB (ref 39–117)
ALT: 29 IU/L (ref 0–44)
AST: 20 IU/L (ref 0–40)
Albumin/Globulin Ratio: 1.8 (ref 1.2–2.2)
BILIRUBIN TOTAL: 1.1 mg/dL (ref 0.0–1.2)
BUN/Creatinine Ratio: 18 (ref 10–24)
BUN: 28 mg/dL — AB (ref 8–27)
CHLORIDE: 96 mmol/L (ref 96–106)
CO2: 26 mmol/L (ref 20–29)
Calcium: 9.3 mg/dL (ref 8.6–10.2)
Creatinine, Ser: 1.55 mg/dL — ABNORMAL HIGH (ref 0.76–1.27)
GFR calc non Af Amer: 44 mL/min/{1.73_m2} — ABNORMAL LOW (ref >59)
GFR, EST AFRICAN AMERICAN: 51 mL/min/{1.73_m2} — AB (ref >59)
GLOBULIN, TOTAL: 2.3 g/dL (ref 1.5–4.5)
Glucose: 110 mg/dL — ABNORMAL HIGH (ref 65–99)
Potassium: 3.9 mmol/L (ref 3.5–5.2)
SODIUM: 138 mmol/L (ref 134–144)
TOTAL PROTEIN: 6.4 g/dL (ref 6.0–8.5)

## 2018-04-16 ENCOUNTER — Ambulatory Visit (INDEPENDENT_AMBULATORY_CARE_PROVIDER_SITE_OTHER): Payer: Medicare Other | Admitting: Pulmonary Disease

## 2018-04-16 ENCOUNTER — Encounter: Payer: Self-pay | Admitting: Pulmonary Disease

## 2018-04-16 VITALS — BP 160/84 | HR 67 | Ht 73.0 in | Wt 160.2 lb

## 2018-04-16 DIAGNOSIS — Z23 Encounter for immunization: Secondary | ICD-10-CM

## 2018-04-16 DIAGNOSIS — J449 Chronic obstructive pulmonary disease, unspecified: Secondary | ICD-10-CM | POA: Diagnosis not present

## 2018-04-16 NOTE — Progress Notes (Signed)
Shakeel Disney    [id number]    1945-08-20  Primary Care Physician:Opalski, Neoma Laming, DO  Referring Physician: [name], [address] [place], [place] [address]  Chief complaint:  Follow-up for COPD George Frazier  HPI: 72 year old with history of COPD GOLD D (CAT score 16., multiple exacerbations) on home oxygen with recurrent admissions for COPD exacerbation his last admission was on 3/4 when he was admitted with an acute exacerbation in setting of RSV.  He was intubated for 1 day.  Extubated and discharged on Symbicort.  He is finished using the Symbicort but has no refills left.  He is just using the albuterol every 4 hours Reports chronic dyspnea on exertion, cough with white mucus.  Denies any fevers, chills  Hospitalized in January and March 2019 for COPD exacerbation in setting of community-acquired pneumonia, RSV infection.  He was intubated briefly in March 2019  Pets: Cat, no dogs, birds or farm animals Occupation: Worked in [employer] and [employer]  Currently retired Exposures: No known exposures, no asbestos, hot tub, mold, Smoking history: 40-pack-year smoking history.  Quit in 2009 Travel History: He has a strong family history of emphysema in with his father and his brothers who are all smokers.  Interim history: Inhalers changed to Trelegy at last visit.  Feels that this works slightly better for him than Symbicort Continues on supplemental oxygen No new complaints today.  Has chronic dyspnea on exertion, cough with white mucus.  Outpatient Encounter Medications as of 04/16/2018  Medication Sig  . albuterol (PROVENTIL HFA;VENTOLIN HFA) 108 (90 Base) MCG/ACT inhaler Inhale 2 puffs into the lungs every 4 (four) hours as needed for wheezing or shortness of breath.  Marland Kitchen atorvastatin (LIPITOR) 80 MG tablet 1 tablet before bed daily  . Cholecalciferol (VITAMIN D3) 5000 units TABS 5,000 IU OTC vitamin D3 daily.  .  Fluticasone-Umeclidin-Vilant (TRELEGY ELLIPTA) 100-62.5-25 MCG/INH AEPB Inhale 1 puff into the lungs daily.  . furosemide (LASIX) 20 MG tablet Take 0.5 tablets (10 mg total) by mouth daily.  . metoprolol succinate (TOPROL-XL) 50 MG 24 hr tablet Take 1 tablet (50 mg total) by mouth daily. Take with or immediately following a meal.  . Olmesartan-amLODIPine-HCTZ 20-5-12.5 MG TABS 0.5tab QD for 1 week, then 1 tab po qd   No facility-administered encounter medications on file as of 04/16/2018.     Allergies as of 04/16/2018  . (No Known Allergies)   Physical Exam: Blood pressure (!) 160/84, pulse 67, height 6\' 1"  (1.854 m), weight 160 lb 3.2 oz (72.7 kg), SpO2 96 %. Gen:      No acute distress HEENT:  EOMI, sclera anicteric Neck:     No masses; no thyromegaly Lungs:    Clear to auscultation bilaterally; normal respiratory effort CV:         Regular rate and rhythm; no murmurs Abd:      + bowel sounds; soft, non-tender; no palpable masses, no distension Ext:    No edema; adequate peripheral perfusion Skin:      Warm and dry; no rash Neuro: alert and oriented x 3 Psych: normal mood and affect  Data Reviewed: Imaging CT chest 09/27/17-no pulmonary embolism, tree-in-bud appearance, cystic appearance in the right kidney. Chest x-ray 09/19/17-hyperinflation.  No acute abnormality  I have reviewed the images personally.  Renal ultrasound 09/18/17-4.3 cm cyst in the right kidney.  No hydronephrosis.  PFTs 11/23/2017- FVC 2.16 (49%], FEV1 0.78 [24%], F/F 36,  TLC 94%, DLCO 30% Severe obstruction and diffusion impairment  Labs CBC 09/17/17-WBC 12.3, eos 0% CBC 10/22/2017-WBC 11.3, eos 2.2%, absolute eosinophil count 200  Alpha-1 antitrypsin 10/22/2017-235, PI MM  Echocardiogram 07/15/2017- LVEF 87-27%, grade 1 diastolic dysfunction, mild dilation of left atrium.  Assessment:  COPD Gold D, oxygen dependent Continues on trelegy inhaler, continue supplemental oxygen Assess for portable  concentrator  Discussed cardiopulmonary rehab but he had therapy about 2 years ago and is getting enough exercise at home on a stationary bike.  Abnormal CT, mediastinal lymph nodes CT scan reviewed with scattered tree-in-bud, subcentimeter nodules likely secondary to inflammation.  Follow-up with repeat CT in 1 year  Health maintenance Influenza vaccine, Pneumovax today  Plan/Recommendations: - Continue trelegy - Continue supplemental oxygen, assess for portable concentrator - Flu vaccine, pneumovax  Marshell Garfinkel MD [facility] 04/16/2018, 4:04 PM  CC: [name], DO

## 2018-04-16 NOTE — Patient Instructions (Signed)
I am glad you are doing well with your breathing Continue trelegy inhaler We will check you for a portable concentrator today Flu vaccine and pneumovax today Follow-up in 6 months

## 2018-04-30 ENCOUNTER — Ambulatory Visit (INDEPENDENT_AMBULATORY_CARE_PROVIDER_SITE_OTHER): Payer: Medicare Other | Admitting: Family Medicine

## 2018-04-30 ENCOUNTER — Encounter: Payer: Self-pay | Admitting: Family Medicine

## 2018-04-30 VITALS — BP 130/80 | HR 71 | Ht 73.0 in | Wt 160.7 lb

## 2018-04-30 DIAGNOSIS — I10 Essential (primary) hypertension: Secondary | ICD-10-CM | POA: Diagnosis not present

## 2018-04-30 DIAGNOSIS — I5032 Chronic diastolic (congestive) heart failure: Secondary | ICD-10-CM

## 2018-04-30 DIAGNOSIS — Z87891 Personal history of nicotine dependence: Secondary | ICD-10-CM

## 2018-04-30 DIAGNOSIS — J449 Chronic obstructive pulmonary disease, unspecified: Secondary | ICD-10-CM | POA: Diagnosis not present

## 2018-04-30 DIAGNOSIS — E785 Hyperlipidemia, unspecified: Secondary | ICD-10-CM

## 2018-04-30 DIAGNOSIS — Z23 Encounter for immunization: Secondary | ICD-10-CM

## 2018-04-30 MED ORDER — TETANUS-DIPHTH-ACELL PERTUSSIS 5-2.5-18.5 LF-MCG/0.5 IM SUSP: 0.5000 mL | Freq: Once | INTRAMUSCULAR | 0 refills | Status: AC

## 2018-04-30 NOTE — Patient Instructions (Addendum)
Since we increased your Lipitor,\cholesterol medicine in July, we can recheck it - your cholesterol sometime in December.    Make a lab only appt for this.  Otherwise I will see you in 4 to 6 months    Guidelines for a Low Cholesterol, Low Saturated Fat Diet   Fats - Limit total intake of fats and oils. - Avoid butter, stick margarine, shortening, lard, palm and coconut oils. - Limit mayonnaise, salad dressings, gravies and sauces, unless they are homemade with low-fat ingredients. - Limit chocolate. - Choose low-fat and nonfat products, such as low-fat mayonnaise, low-fat or non-hydrogenated peanut butter, low-fat or fat-free salad dressings and nonfat gravy. - Use vegetable oil, such as canola or olive oil. - Look for margarine that does not contain trans fatty acids. - Use nuts in moderate amounts. - Read ingredient labels carefully to determine both amount and type of fat present in foods. Limit saturated and trans fats! - Avoid high-fat processed and convenience foods.  Meats and Meat Alternatives - Choose fish, chicken, [place] and lean meats. - Use dried beans, peas, lentils and tofu. - Limit egg yolks to three to four per week. - If you eat red meat, limit to no more than three servings per week and choose loin or round cuts. - Avoid fatty meats, such as bacon, sausage, franks, luncheon meats and ribs. - Avoid all organ meats, including liver.  Dairy - Choose nonfat or low-fat milk, yogurt and cottage cheese. - Most cheeses are high in fat. Choose cheeses made from non-fat milk, such as mozzarella and ricotta cheese. - Choose light or fat-free cream cheese and sour cream. - Avoid cream and sauces made with cream.  Fruits and Vegetables - Eat a wide variety of fruits and vegetables. - Use lemon juice, vinegar or "mist" olive oil on vegetables. - Avoid adding sauces, fat or oil to vegetables.  Breads, Cereals and Grains - Choose whole-grain breads, cereals, pastas and  rice. - Avoid high-fat snack foods, such as granola, cookies, pies, pastries, doughnuts and croissants.  Cooking Tips - Avoid deep fried foods. - Trim visible fat off meats and remove skin from poultry before cooking. - Bake, broil, boil, poach or roast poultry, fish and lean meats. - Drain and discard fat that drains out of meat as you cook it. - Add little or no fat to foods. - Use vegetable oil sprays to grease pans for cooking or baking. - Steam vegetables. - Use herbs or no-oil marinades to flavor foods.

## 2018-04-30 NOTE — Progress Notes (Signed)
Impression and Recommendations:    1. Essential hypertension- poorly controlled   2. Hyperlipidemia, unspecified hyperlipidemia type   3. Chronic diastolic heart failure ([place])   4. Chronic obstructive pulmonary disease, unspecified COPD type ([place])   5. Stopped smoking 2008 with greater than 40 pack year history   6. Need for Tdap vaccination     - Patient knows to call and come in for evaluation if he's feeling dizzy or lightheaded.  1. Mild Diastolic Heart Failure (Grade 1) - Asymptomatic at this time. - Continue treatment plan as prescribed.  See med list below. - Patient tolerating meds well without complication.  Denies S-E - Patient with mild diastolic heart failure, grade 1, seen on echo in 06/2017.   2. Pulmonology - COPD - Continue to follow-up for COPD treatment and maintenance with Dr. Vaughan Browner.  3. Hypertension - BP 130/80 on re-check in office.  Stable at home. - Continue treatment plan as prescribed.  See med list below. - Patient tolerating meds well without complication.  Denies S-E  - Patient is asymptomatic, with blood pressure under better control at home.  In office today, denies chest pain, denies increasing shortness of breath, denies chest tightness, denies headaches, and notes no new symptoms he can think of.  - Reviewed again that for a man of 72 years old with a greater than 40 pack-year smoking history, his blood pressure should consistently ideally fall less than 140/90.  - Lifestyle changes such as dash diet and engaging in a regular exercise program discussed with patient.  Educational handouts provided PRN.  - Continue to regularly monitor blood pressure and pulse at home, keeping a log of ambulatory measurements.  Discussed prudent ambulatory BP monitoring habits with patient today.  4. Hyperlipidemia - Statin Increased 01/24/2018 - Lipitor dose increased from 40 to 80 01/24/2018.  - Asymptomatic at this time. - Continue treatment plan  as prescribed.  See med list below. - Patient tolerating meds well without complication.  Denies S-E  - Consume a prudent low-cholesterol diet, with use of medication to decrease LDL below 100.  - Advised adjustment of diet and exercise, along with medications. - Continue to stay as active as possible to improve HDL value and lower LDL value.  - Patient will continue to take prescribed statin each evening before bedtime. - Emphasized the critical importance of adequate hydration while on statins, to reduce incidence of S-E.  5. Low Vitamin D - Patient will continue taking once daily Vitamin D.  6. Exercise & Lifestyle Habits - Advised patient tohydrate adequately.  - Advised patient to continue working toward exercise to improve health, as tolerated.  - PTwith COPD and past heart failure, but willbegin with 10-15 minutes of activity daily as tolerated. Recommended that the patient eventually strive for adequate activityper week, as tolerated, according to guidelines established bythe AHA.   - Healthy dietary habits encouraged, including low-carb, and high amounts of lean protein in diet.   Education and routine counseling performed. Handouts provided.  7. Follow-Up - Return in 4 months for chronic care follow-up. - Patient will bring a written log of ambulatory blood pressures and pulse/HR taken at home. - Otherwise, continue to follow up with specialists as scheduled.  - STRONGLY encouraged patient to return with other concerns as they emerge.  - Patient will let us know immediately if he has difficulty lying flat, notes leg swelling, chest pain, or other symptoms of returning heart failure.  - Patient knows  to return as scheduled, orasneeded if any alarming symptoms arise.   Education and routine counseling performed. Handouts provided.   Meds ordered this encounter  Medications  . Tdap (BOOSTRIX) 5-2.5-18.5 LF-MCG/0.5 injection    Sig: Inject 0.5  mLs into the muscle once for 1 dose.    Dispense:  0.5 mL    Refill:  0    The patient was counseled, risk factors were discussed, anticipatory guidance given.  Gross side effects, risk and benefits, and alternatives of medications discussed with patient.  Patient is aware that all medications have potential side effects and we are unable to predict every side effect or drug-drug interaction that may occur.  Expresses verbal understanding and consents to current therapy plan and treatment regimen.  Return for Mid December-lab only for FLP; 4-6 months OV with me blood pressure etc..  Please see AVS handed out to patient at the end of our visit for further patient instructions/ counseling done pertaining to today's office visit.    Note:  This document was prepared using Dragon voice recognition software and may include unintentional dictation errors.  This document serves as a record of services personally performed by [name], DO. It was created on her behalf by [name], a trained medical scribe. The creation of this record is based on the scribe's personal observations and the provider's statements to them.   I have reviewed the above medical documentation for accuracy and completeness and I concur.  [name] 04/30/18 5:12 PM     Subjective:    HPI: George Frazier is a 72 y.o. male who presents to [facility] today for follow up of [place].    Last appointment Lasix was decreased to half, as his breathing was not improved on increased Lasix.  States he's been feeling better in general. Dr. Merri Ray thinks his breathing has been good.  At home he notes he's not walking as much outside since the weather's been up and down.  He does have a stationary bike at home.  Notes he uses this about 3-4 times a week typically.  HTN:  -  His blood pressure has been better controlled at home.  Pt has been checking it  regularly.  Feels that his blood pressure has improved a lot.  States "it's still not where you want it, but it's better."  BP Range at Home: 123/71 126/79 104/64 - lowest 112/74 109/65 114/76  - Patient reports good compliance with blood pressure medications  - Denies medication S-E.   - Smoking Status noted   - He denies new onset of: chest pain, exercise intolerance, shortness of breath, dizziness, visual changes, headache, lower extremity swelling or claudication.   Denies difficulty lying flat.  Last 3 blood pressure readings in our office are as follows: BP Readings from Last 3 Encounters:  04/30/18 130/80  04/16/18 (!) 160/84  03/12/18 119/79    Pulse Readings from Last 3 Encounters:  04/30/18 71  04/16/18 67  03/12/18 97    Filed Weights   04/30/18 1104  Weight: 160 lb 11.2 oz (72.9 kg)    1. 72 y.o. male here for cholesterol follow-up.   - Patient reports good compliance with medications or treatment plan  - Denies medication S-E   - Smoking Status noted   - He denies new onset of: chest pain, exercise intolerance, shortness of breath, dizziness, visual changes, headache, lower extremity swelling or claudication.   Denies myalgias or  other new muscle aches or joint aches that are new.  The cholesterol last visit was:  Lab Results  Component Value Date   CHOL 260 (H) 12/11/2017   HDL 56 12/11/2017   LDLCALC 180 (H) 12/11/2017   TRIG 118 12/11/2017   CHOLHDL 4.6 12/11/2017    Hepatic Function Latest Ref Rng & Units 03/12/2018 12/11/2017 09/18/2017  Total Protein 6.0 - 8.5 g/dL 6.4 6.8 6.0(L)  Albumin 3.5 - 4.8 g/dL 4.1 4.5 3.0(L)  AST 0 - 40 IU/L 20 22 53(H)  ALT 0 - 44 IU/L 29 17 48  Alk Phosphatase 39 - 117 IU/L 123(H) 112 57  Total Bilirubin 0.0 - 1.2 mg/dL 1.1 0.2 0.9  Bilirubin, Direct 0.1 - 0.5 mg/dL - - 0.2     Patient Care Team    Relationship Specialty Notifications Start End  [name], DO PCP - General Family Medicine   10/22/17   Marshell Garfinkel, MD Consulting Physician Pulmonary Disease  11/06/17      Lab Results  Component Value Date   CREATININE 1.55 (H) 03/12/2018   BUN 28 (H) 03/12/2018   NA 138 03/12/2018   K 3.9 03/12/2018   CL 96 03/12/2018   CO2 26 03/12/2018    Lab Results  Component Value Date   CHOL 260 (H) 12/11/2017   CHOL 127 07/15/2017    Lab Results  Component Value Date   HDL 56 12/11/2017   HDL 34 (L) 07/15/2017    Lab Results  Component Value Date   LDLCALC 180 (H) 12/11/2017   LDLCALC 79 07/15/2017    Lab Results  Component Value Date   TRIG 118 12/11/2017   TRIG 72 07/15/2017    Lab Results  Component Value Date   CHOLHDL 4.6 12/11/2017   CHOLHDL 3.7 07/15/2017    No results found for: LDLDIRECT ===================================================================   Patient Active Problem List   Diagnosis Date Noted  . Hyperlipidemia 12/13/2017  . Vitamin D insufficiency 12/13/2017  . Physical deconditioning 11/15/2017  . Hypertension 11/06/2017  . Stopped smoking 2008 with greater than 40 pack year history 11/06/2017  . Chronic fatigue 11/06/2017  . Dyspnea on minimal exertion-likely due to COPD 11/06/2017  . Acute respiratory failure with hypoxia ([place]) 09/17/2017  . CAP (community acquired pneumonia) 07/14/2017  . COPD, group D, by GOLD 2017 classification ([place]) 07/14/2017  . Heart failure ([place]) 07/14/2017     Past Medical History:  Diagnosis Date  . CHF (congestive heart failure) ([place])   . COPD (chronic obstructive pulmonary disease) ([place])   . Hypertension      History reviewed. No pertinent surgical history.   Family History  Problem Relation Age of Onset  . CAD Mother 50  . Hypertension Mother   . Stroke Mother   . COPD Father 44  . AAA (abdominal aortic aneurysm) Father   . COPD Brother   . COPD Brother   . Cancer Neg Hx      Social History   Substance and Sexual Activity  Drug Use No  ,  Social History    Substance and Sexual Activity  Alcohol Use No  . Frequency: Never  ,  Social History   Tobacco Use  Smoking Status Former Smoker  . Packs/day: 1.00  . Years: 40.00  . Pack years: 40.00  . Last attempt to quit: 2008  . Years since quitting: 11.7  Smokeless Tobacco Former Systems developer  ,    Current Outpatient Medications on File Prior to Visit  Medication  Sig Dispense Refill  . albuterol (PROVENTIL HFA;VENTOLIN HFA) 108 (90 Base) MCG/ACT inhaler Inhale 2 puffs into the lungs every 4 (four) hours as needed for wheezing or shortness of breath. 1 Inhaler 2  . atorvastatin (LIPITOR) 80 MG tablet 1 tablet before bed daily 90 tablet 3  . Cholecalciferol (VITAMIN D3) 5000 units TABS 5,000 IU OTC vitamin D3 daily. 90 tablet 3  . Fluticasone-Umeclidin-Vilant (TRELEGY ELLIPTA) 100-62.5-25 MCG/INH AEPB Inhale 1 puff into the lungs daily. 60 each 5  . furosemide (LASIX) 20 MG tablet Take 0.5 tablets (10 mg total) by mouth daily. 45 tablet 1  . metoprolol succinate (TOPROL-XL) 50 MG 24 hr tablet Take 1 tablet (50 mg total) by mouth daily. Take with or immediately following a meal. 90 tablet 3  . Olmesartan-amLODIPine-HCTZ 20-5-12.5 MG TABS 0.5tab QD for 1 week, then 1 tab po qd 90 tablet 0   No current facility-administered medications on file prior to visit.      No Known Allergies   Review of Systems:   General:  Denies fever, chills Optho/Auditory:   Denies visual changes, blurred vision Respiratory:   Denies SOB, cough, wheeze, DIB  Cardiovascular:   Denies chest pain, palpitations, painful respirations Gastrointestinal:   Denies nausea, vomiting, diarrhea.  Endocrine:     Denies new hot or cold intolerance Musculoskeletal:  Denies joint swelling, gait issues, or new unexplained myalgias/ arthralgias Skin:  Denies rash, suspicious lesions  Neurological:    Denies dizziness, unexplained weakness, numbness  Psychiatric/Behavioral:   Denies mood changes  Objective:    Blood pressure  130/80, pulse 71, height '6\' 1"'  (1.854 m), weight 160 lb 11.2 oz (72.9 kg), SpO2 96 %.  Body mass index is 21.2 kg/m.  General: Well Developed, well nourished, and in no acute distress.  HEENT: Normocephalic, atraumatic, pupils equal round reactive to light, neck supple, No carotid bruits, no JVD Skin: Warm and dry, cap RF less 2 sec Cardiac: Regular rate and rhythm, S1, S2 WNL's, no murmurs rubs or gallops Respiratory: ECTA B/L, Not using accessory muscles, speaking in full sentences. NeuroM-Sk: Ambulates w/o assistance, moves ext * 4 w/o difficulty, sensation grossly intact.  Ext: scant edema b/l lower ext Psych: No HI/SI, judgement and insight good, Euthymic mood. Full Affect.

## 2018-06-03 ENCOUNTER — Other Ambulatory Visit: Payer: Self-pay | Admitting: Pulmonary Disease

## 2018-07-03 ENCOUNTER — Other Ambulatory Visit (INDEPENDENT_AMBULATORY_CARE_PROVIDER_SITE_OTHER): Payer: Medicare Other

## 2018-07-03 ENCOUNTER — Other Ambulatory Visit: Payer: Self-pay

## 2018-07-03 DIAGNOSIS — E785 Hyperlipidemia, unspecified: Secondary | ICD-10-CM

## 2018-07-04 LAB — LIPID PANEL
Chol/HDL Ratio: 3 ratio (ref 0.0–5.0)
Cholesterol, Total: 118 mg/dL (ref 100–199)
HDL: 40 mg/dL (ref >39)
LDL Calculated: 61 mg/dL (ref 0–99)
Triglycerides: 83 mg/dL (ref 0–149)
VLDL CHOLESTEROL CAL: 17 mg/dL (ref 5–40)

## 2018-08-08 ENCOUNTER — Encounter: Payer: Self-pay | Admitting: Family Medicine

## 2018-08-08 ENCOUNTER — Ambulatory Visit (INDEPENDENT_AMBULATORY_CARE_PROVIDER_SITE_OTHER): Payer: Medicare Other | Admitting: Family Medicine

## 2018-08-08 ENCOUNTER — Other Ambulatory Visit: Payer: Self-pay

## 2018-08-08 ENCOUNTER — Encounter (HOSPITAL_COMMUNITY): Payer: Self-pay

## 2018-08-08 ENCOUNTER — Ambulatory Visit: Payer: Medicare Other

## 2018-08-08 ENCOUNTER — Emergency Department (HOSPITAL_COMMUNITY): Payer: Medicare Other

## 2018-08-08 ENCOUNTER — Inpatient Hospital Stay (HOSPITAL_COMMUNITY)
Admission: EM | Admit: 2018-08-08 | Discharge: 2018-08-10 | DRG: 190 | Disposition: A | Discharge status: Home / Self Care | Payer: Medicare Other | Attending: Internal Medicine | Admitting: Internal Medicine

## 2018-08-08 VITALS — BP 168/106 | HR 130 | Temp 98.7°F | Ht 73.0 in | Wt 157.3 lb

## 2018-08-08 DIAGNOSIS — Z87891 Personal history of nicotine dependence: Secondary | ICD-10-CM

## 2018-08-08 DIAGNOSIS — R0602 Shortness of breath: Secondary | ICD-10-CM

## 2018-08-08 DIAGNOSIS — J9601 Acute respiratory failure with hypoxia: Secondary | ICD-10-CM

## 2018-08-08 DIAGNOSIS — T380X5A Adverse effect of glucocorticoids and synthetic analogues, initial encounter: Secondary | ICD-10-CM | POA: Diagnosis present

## 2018-08-08 DIAGNOSIS — J9621 Acute and chronic respiratory failure with hypoxia: Secondary | ICD-10-CM | POA: Diagnosis not present

## 2018-08-08 DIAGNOSIS — Z79899 Other long term (current) drug therapy: Secondary | ICD-10-CM

## 2018-08-08 DIAGNOSIS — Z7951 Long term (current) use of inhaled steroids: Secondary | ICD-10-CM

## 2018-08-08 DIAGNOSIS — Z9981 Dependence on supplemental oxygen: Secondary | ICD-10-CM

## 2018-08-08 DIAGNOSIS — D72829 Elevated white blood cell count, unspecified: Secondary | ICD-10-CM | POA: Diagnosis present

## 2018-08-08 DIAGNOSIS — J9611 Chronic respiratory failure with hypoxia: Secondary | ICD-10-CM | POA: Diagnosis present

## 2018-08-08 DIAGNOSIS — R05 Cough: Secondary | ICD-10-CM | POA: Diagnosis not present

## 2018-08-08 DIAGNOSIS — E876 Hypokalemia: Secondary | ICD-10-CM | POA: Diagnosis present

## 2018-08-08 DIAGNOSIS — R911 Solitary pulmonary nodule: Secondary | ICD-10-CM | POA: Diagnosis present

## 2018-08-08 DIAGNOSIS — I10 Essential (primary) hypertension: Secondary | ICD-10-CM | POA: Diagnosis present

## 2018-08-08 DIAGNOSIS — J441 Chronic obstructive pulmonary disease with (acute) exacerbation: Secondary | ICD-10-CM | POA: Diagnosis not present

## 2018-08-08 DIAGNOSIS — J449 Chronic obstructive pulmonary disease, unspecified: Secondary | ICD-10-CM | POA: Diagnosis present

## 2018-08-08 DIAGNOSIS — I509 Heart failure, unspecified: Secondary | ICD-10-CM | POA: Diagnosis not present

## 2018-08-08 DIAGNOSIS — R059 Cough, unspecified: Secondary | ICD-10-CM

## 2018-08-08 DIAGNOSIS — E785 Hyperlipidemia, unspecified: Secondary | ICD-10-CM | POA: Diagnosis present

## 2018-08-08 DIAGNOSIS — I5032 Chronic diastolic (congestive) heart failure: Secondary | ICD-10-CM

## 2018-08-08 DIAGNOSIS — D649 Anemia, unspecified: Secondary | ICD-10-CM | POA: Diagnosis present

## 2018-08-08 DIAGNOSIS — R000 Tachycardia, unspecified: Secondary | ICD-10-CM | POA: Diagnosis not present

## 2018-08-08 LAB — BASIC METABOLIC PANEL
Anion gap: 10 (ref 5–15)
BUN: 10 mg/dL (ref 8–23)
CO2: 33 mmol/L — ABNORMAL HIGH (ref 22–32)
Calcium: 8.9 mg/dL (ref 8.9–10.3)
Chloride: 97 mmol/L — ABNORMAL LOW (ref 98–111)
Creatinine, Ser: 0.98 mg/dL (ref 0.61–1.24)
GFR calc Af Amer: >60 mL/min (ref >60)
GFR calc non Af Amer: >60 mL/min (ref >60)
Glucose, Bld: 121 mg/dL — ABNORMAL HIGH (ref 70–99)
Potassium: 3.7 mmol/L (ref 3.5–5.1)
Sodium: 140 mmol/L (ref 135–145)

## 2018-08-08 LAB — CBC WITH DIFFERENTIAL/PLATELET
Abs Immature Granulocytes: 0.12 10*3/uL — ABNORMAL HIGH (ref 0.00–0.07)
Basophils Absolute: 0.1 10*3/uL (ref 0.0–0.1)
Basophils Relative: 1 %
Eosinophils Absolute: 0.1 10*3/uL (ref 0.0–0.5)
Eosinophils Relative: 1 %
HCT: 39.9 % (ref 39.0–52.0)
Hemoglobin: 12.1 g/dL — ABNORMAL LOW (ref 13.0–17.0)
Immature Granulocytes: 1 %
Lymphocytes Relative: 9 %
Lymphs Abs: 1.1 10*3/uL (ref 0.7–4.0)
MCH: 29.7 pg (ref 26.0–34.0)
MCHC: 30.3 g/dL (ref 30.0–36.0)
MCV: 98 fL (ref 80.0–100.0)
Monocytes Absolute: 0.7 10*3/uL (ref 0.1–1.0)
Monocytes Relative: 6 %
Neutro Abs: 10.5 10*3/uL — ABNORMAL HIGH (ref 1.7–7.7)
Neutrophils Relative %: 82 %
Platelets: 481 10*3/uL — ABNORMAL HIGH (ref 150–400)
RBC: 4.07 MIL/uL — ABNORMAL LOW (ref 4.22–5.81)
RDW: 12.8 % (ref 11.5–15.5)
WBC: 12.5 10*3/uL — ABNORMAL HIGH (ref 4.0–10.5)
nRBC: 0 % (ref 0.0–0.2)

## 2018-08-08 LAB — TROPONIN I: Troponin I: <0.03 ng/mL (ref <0.03)

## 2018-08-08 LAB — BRAIN NATRIURETIC PEPTIDE: B Natriuretic Peptide: 63.5 pg/mL (ref 0.0–100.0)

## 2018-08-08 IMAGING — DX DG CHEST 2V
2 series · 2 of 2 positions shown · non-contrast
Comparison: None.

CLINICAL DATA: Cough and congestion

EXAM:
CHEST - 2 VIEW

[chest pa]
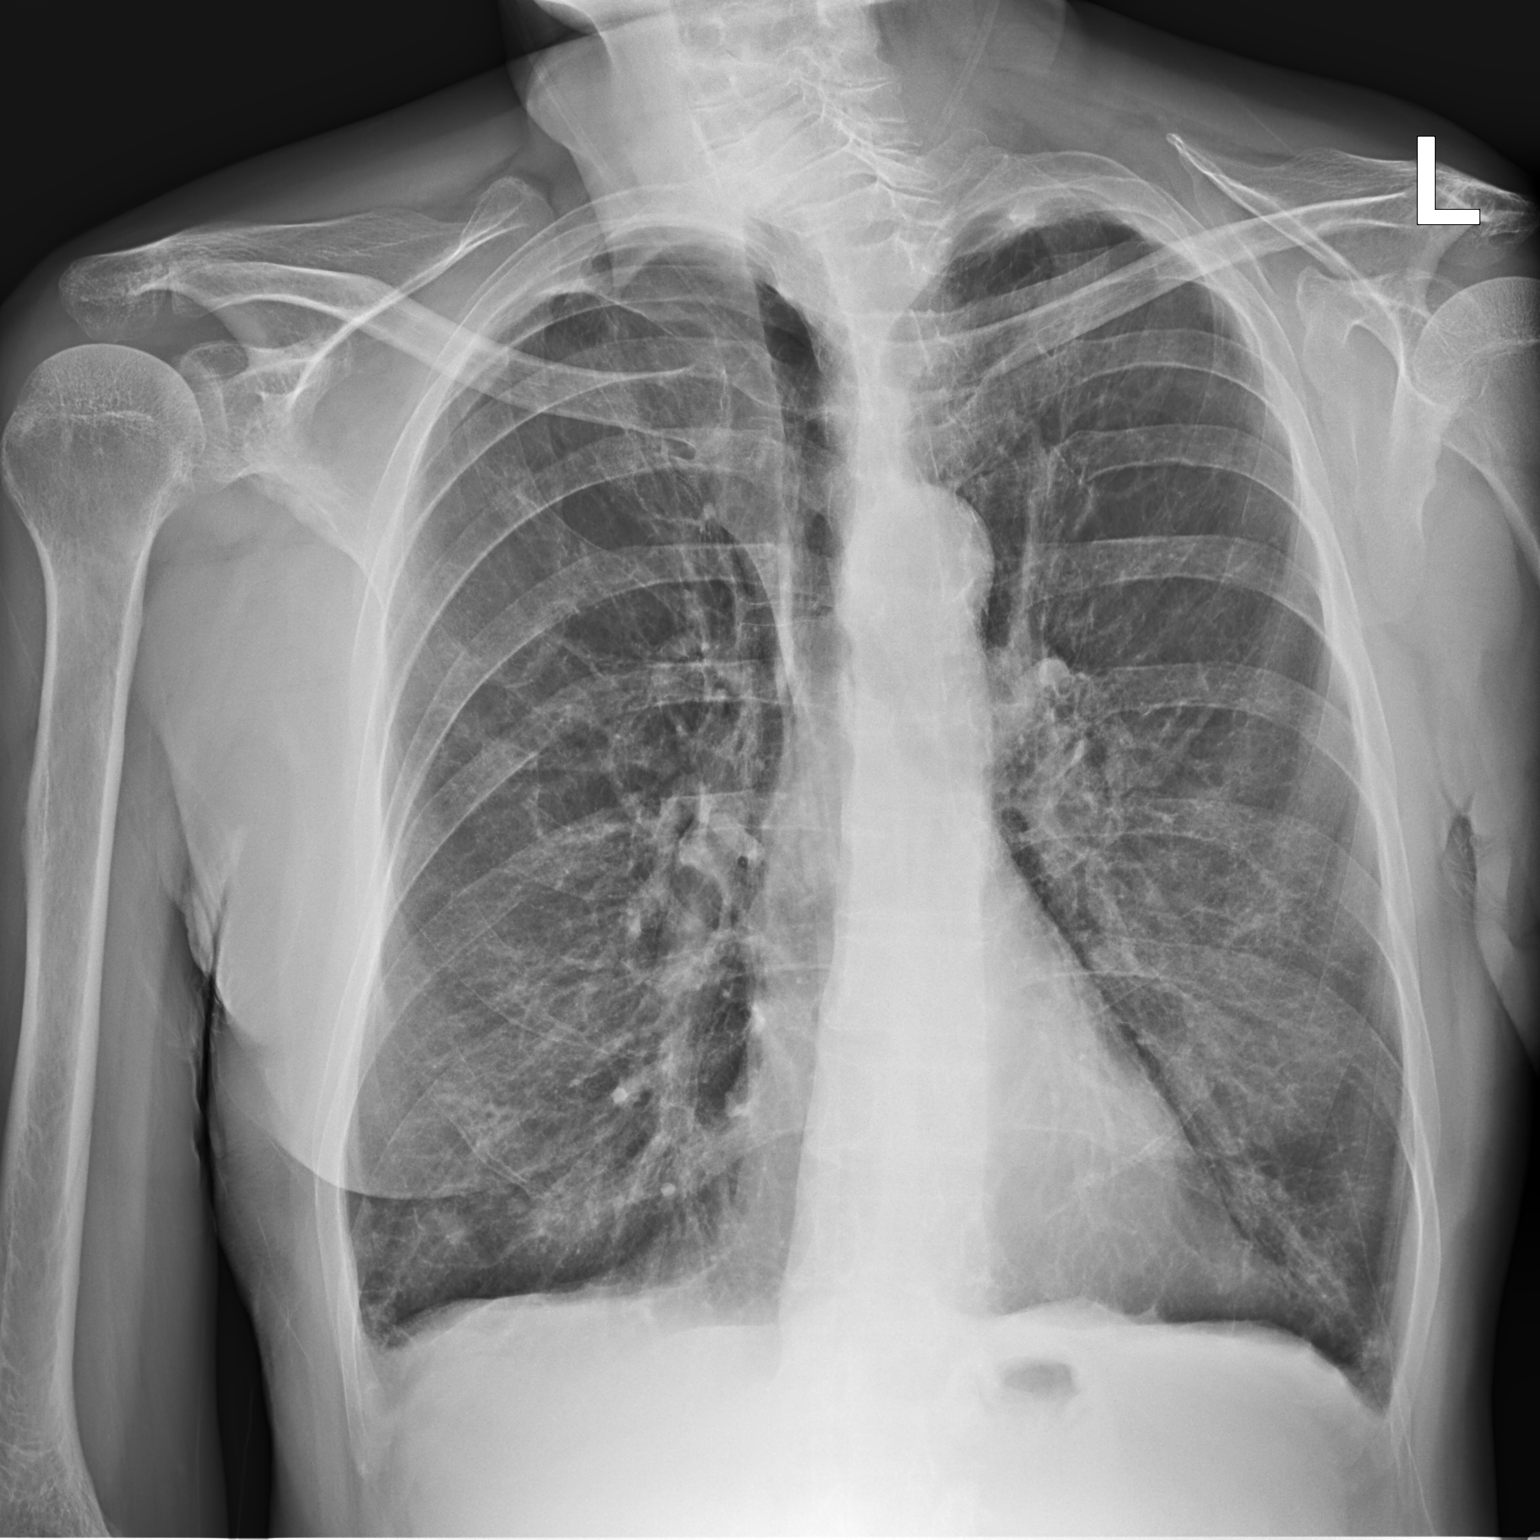

[chest lat]
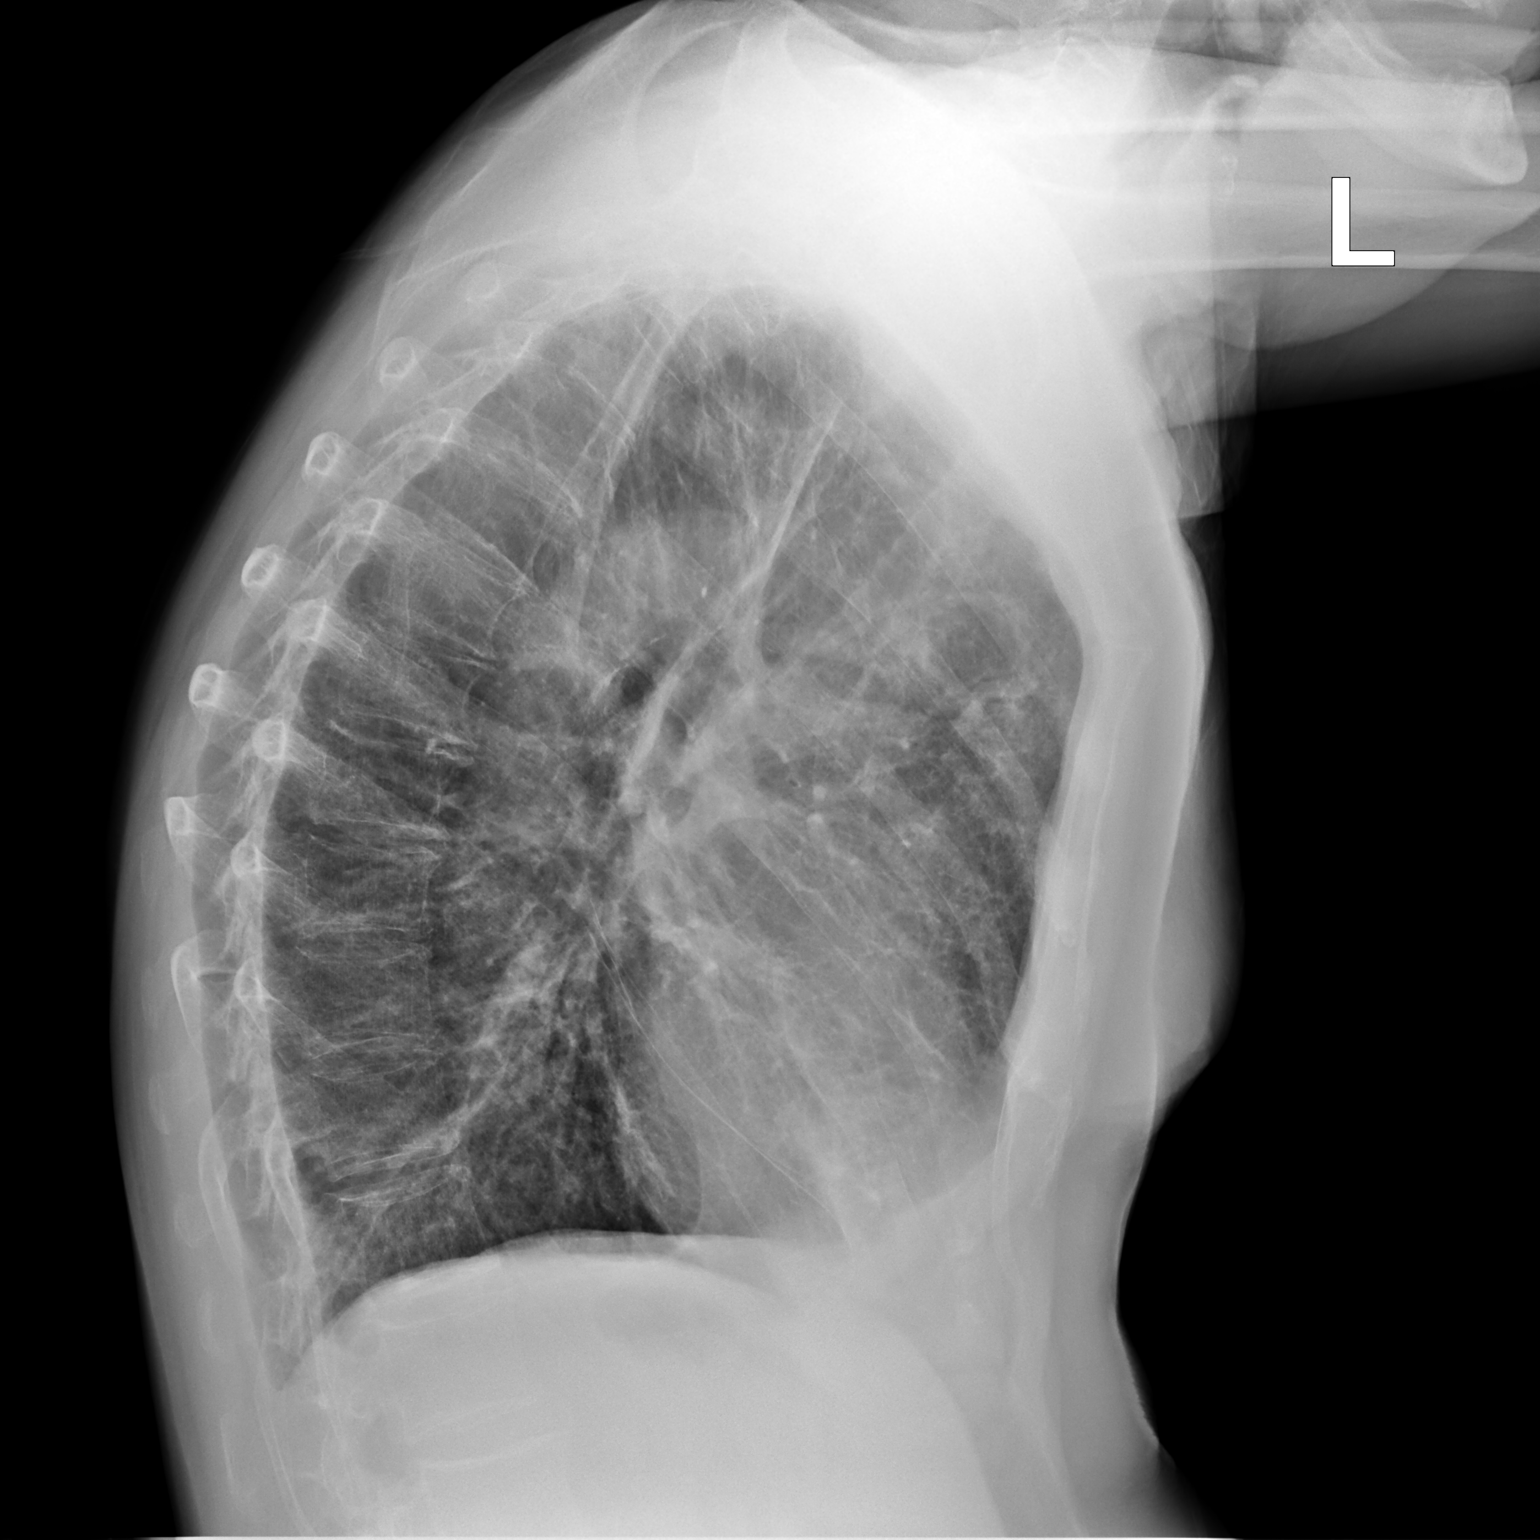

[2 of 2 positions shown; findings below may reference images not displayed]

FINDINGS: Lungs are hyperexpanded. There is mild scarring in the bases. There
is no edema or consolidation. The heart size and pulmonary
vascularity are normal. No adenopathy. There is aortic
atherosclerosis. There is degenerative change in the thoracic spine.
IMPRESSION: Lungs hyperexpanded with slight scarring in the bases. No edema or
consolidation. Heart size normal. There is aortic atherosclerosis.

Aortic Atherosclerosis ([CZ]-[CZ]).

## 2018-08-08 IMAGING — CT CT ANGIO CHEST
2 of 6 series · 18 of 46 positions shown · IV contrast (ISOVUE)
Comparison: [DATE]

CLINICAL DATA: Chest pain and shortness of breath

EXAM:
CT ANGIOGRAPHY CHEST WITH CONTRAST
TECHNIQUE: Multidetector CT imaging of the chest was performed using the
standard protocol during bolus administration of intravenous
contrast. Multiplanar CT image reconstructions and MIPs were
obtained to evaluate the vascular anatomy.
CONTRAST:  100mL [RY] IOPAMIDOL ([RY]) INJECTION 76%

[Series 5: thins · axial · 0.69mm/px · z∈[-124,+176]mm · 15 of 330 slices shown]
[im 15/330  lung]
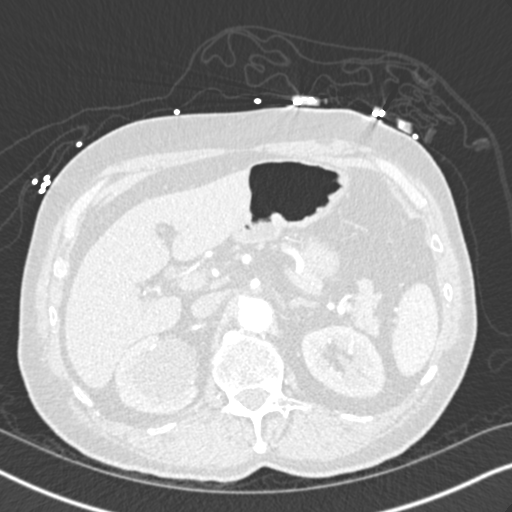
[im 43/330  soft-tissue]
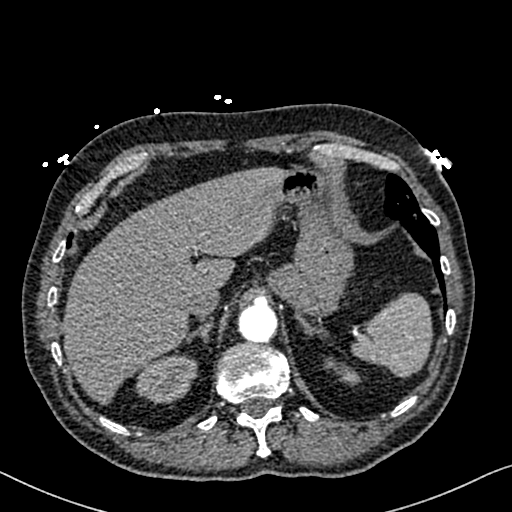
[im 58/330  lung]
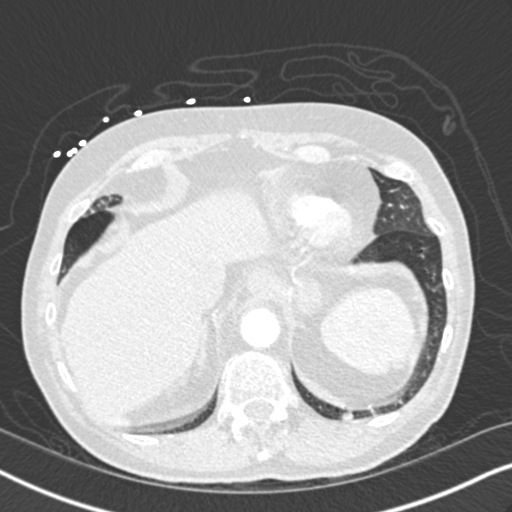
[im 86/330  soft-tissue]
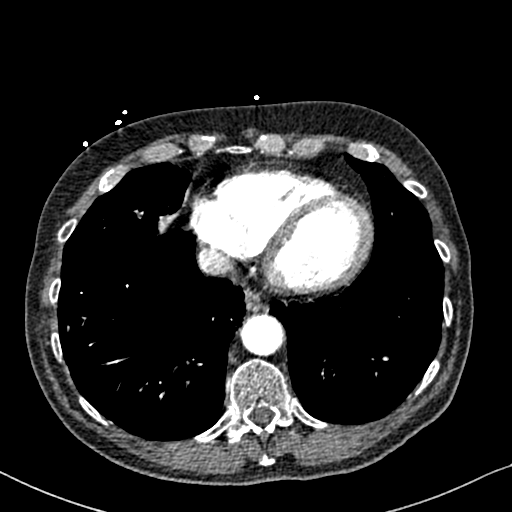
[im 101/330  lung]
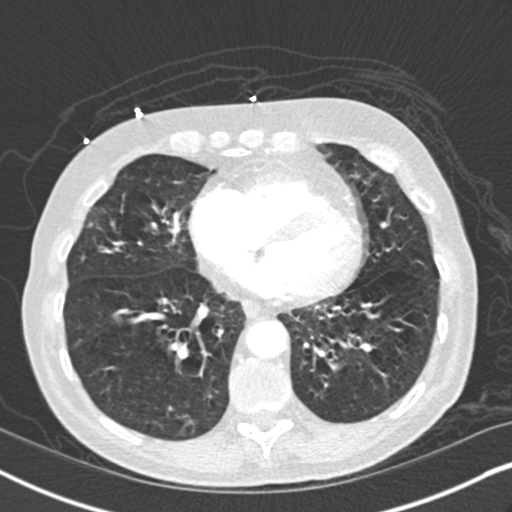
[im 129/330  soft-tissue]
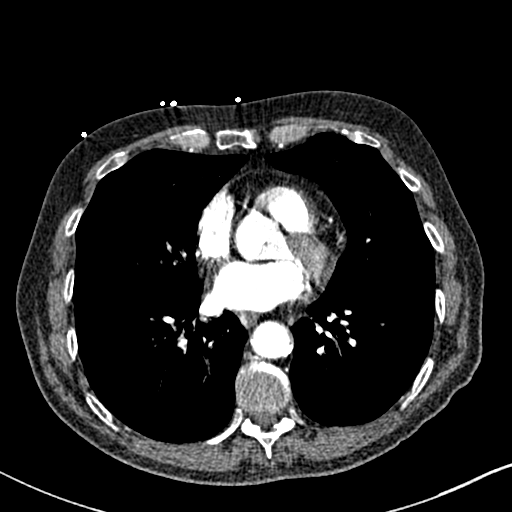
[im 144/330  lung]
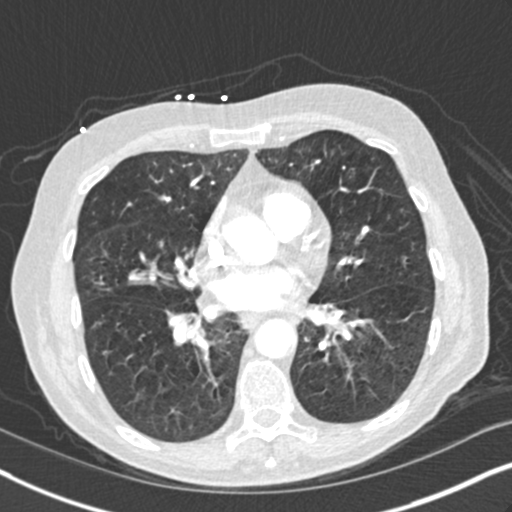
[im 172/330  soft-tissue]
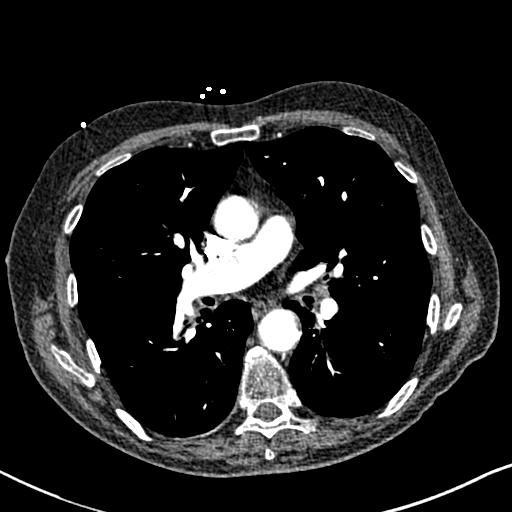
[im 186/330  lung]
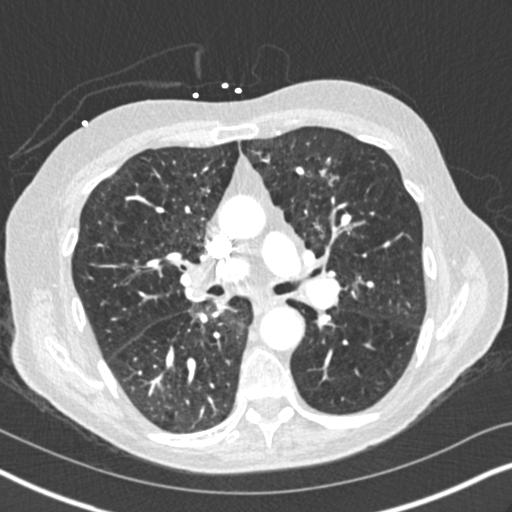
[im 201/330  soft-tissue]
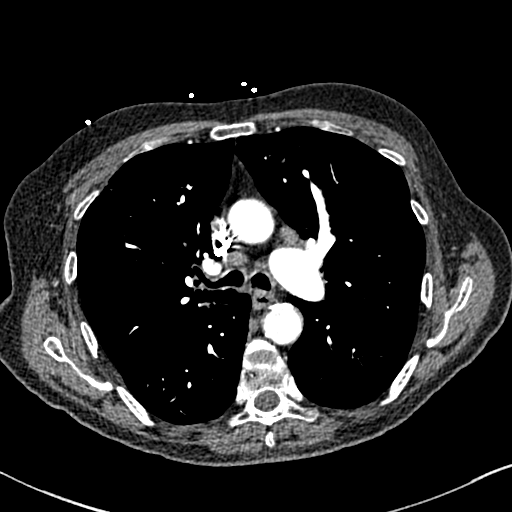
[im 229/330  lung]
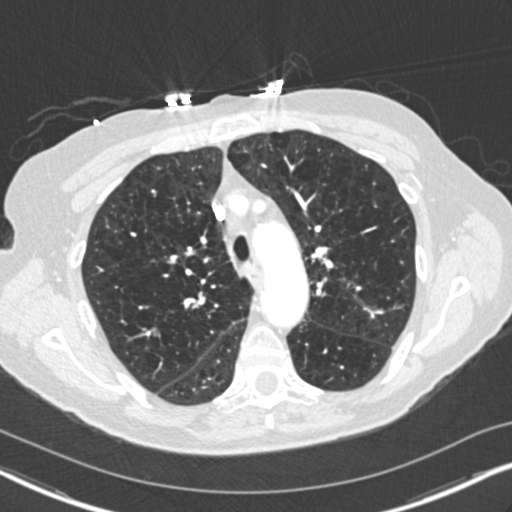
[im 244/330  soft-tissue]
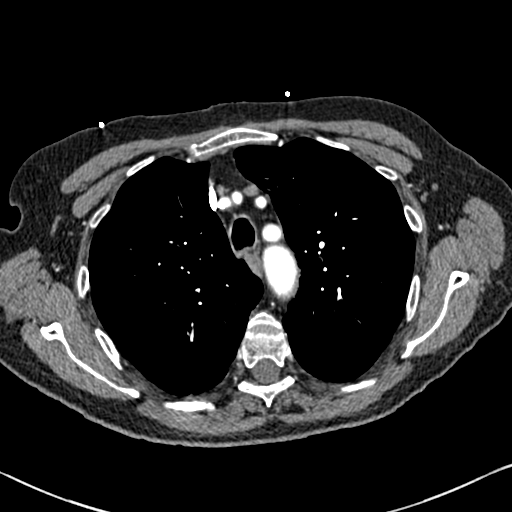
[im 272/330  lung]
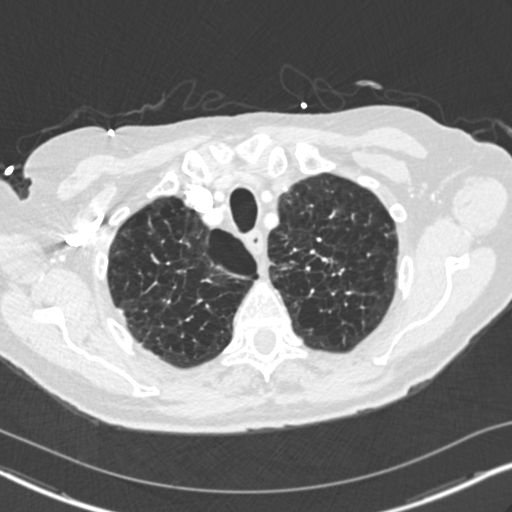
[im 287/330  soft-tissue]
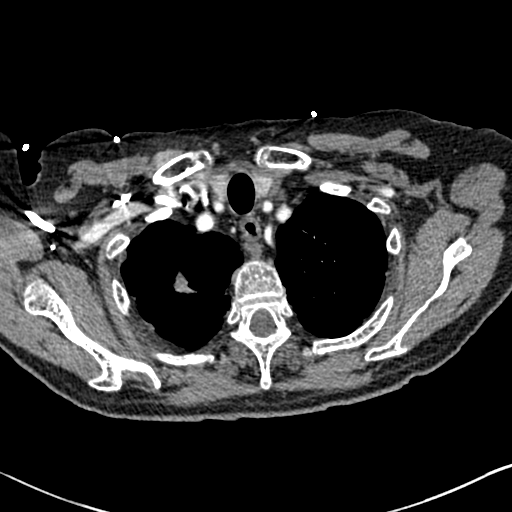
[im 315/330  lung]
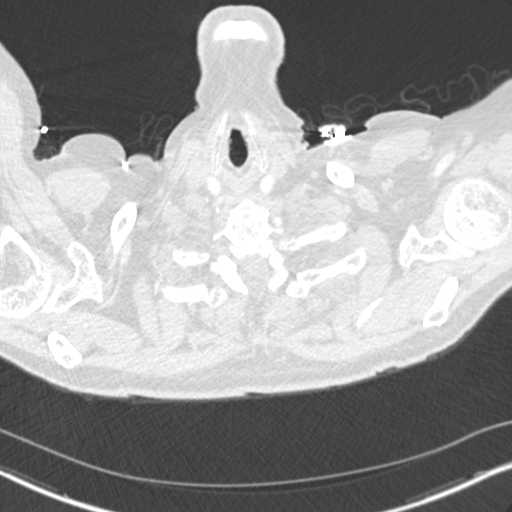

[Series 7: coronal mpr · coronal · 0.69mm/px · 3 of 155 slices shown]
[im 39/155  soft-tissue]
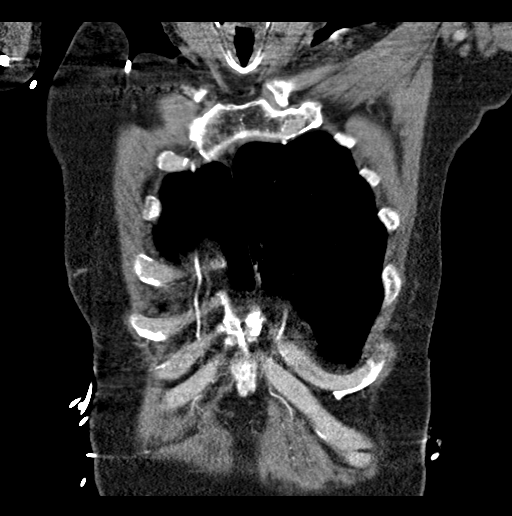
[im 78/155  soft-tissue]
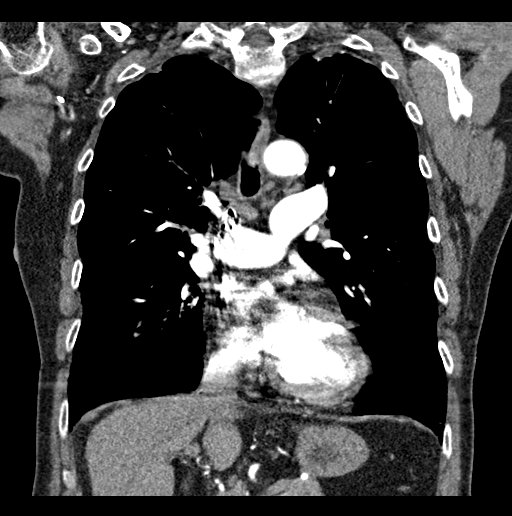
[im 116/155  soft-tissue]
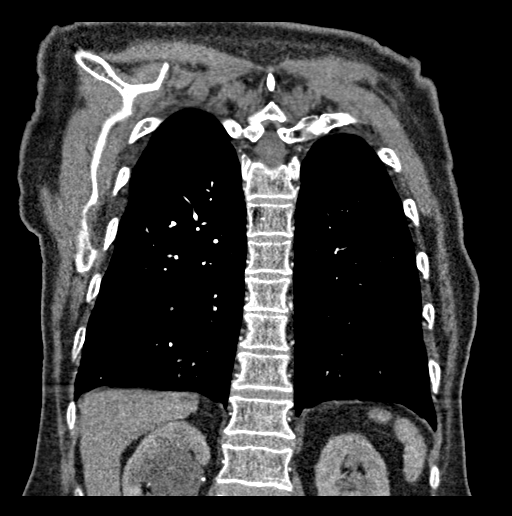

[18 of 46 positions shown; findings below may reference images not displayed]

FINDINGS: Cardiovascular: Thoracic aorta demonstrates atherosclerotic
calcifications without aneurysmal dilatation or dissection. No
cardiac enlargement is seen. Coronary calcifications are seen. The
pulmonary artery shows a normal branching pattern without
intraluminal filling defect to suggest pulmonary embolism. The lower
lobe branches are somewhat limited due to patient motion artifact.
No definitive filling defects are seen.

Mediastinum/Nodes: The esophagus is within normal limits. No hilar
or mediastinal adenopathy is noted. Small hilar lymph nodes are
noted and stable. The thoracic inlet is within normal limits.

Lungs/Pleura: Diffuse emphysematous changes are identified with some
associated scarring. Right middle lobe atelectatic changes are noted
medially. No sizable effusion is noted. There is a somewhat nodular
density identified in the right upper lobe best seen on image number
41 of series 6 and image number 74 of series 7. This measures
approximately 6 mm and is slightly increased when compared with the
prior exam. This may represent some further scarring although
follow-up is recommended.

Upper Abdomen: Visualized upper abdomen demonstrates central right
renal cyst. No obstructive changes are seen. The remainder of the
upper abdomen is unremarkable.

Musculoskeletal: Degenerative changes of the thoracic spine are
seen.

Review of the MIP images confirms the above findings.
IMPRESSION: No evidence of pulmonary emboli.

Emphysematous changes with areas of scarring bilaterally. Right
middle lobe atelectasis is seen.

6 mm somewhat nodular density which is increased in the interval
from the prior exam and may simply represent some scarring although
follow-up is recommended. Non-contrast chest CT at 6-12 months is
recommended. If the nodule is stable at time of repeat CT, then
future CT at 18-24 months (from today's scan) is considered optional
for low-risk patients, but is recommended for high-risk patients.
This recommendation follows the consensus statement: Guidelines for
Management of Incidental Pulmonary Nodules Detected on CT Images:

Right renal cyst

Aortic Atherosclerosis ([RY]-[RY]) and Emphysema ([RY]-[RY]).

## 2018-08-08 MED ORDER — SODIUM CHLORIDE 0.9 % IV SOLN: 250.0000 mL | INTRAVENOUS | Status: DC | PRN

## 2018-08-08 MED ORDER — FLUTICASONE FUROATE-VILANTEROL 100-25 MCG/INH IN AEPB
1.0000 | INHALATION_SPRAY | Freq: Every day | RESPIRATORY_TRACT | Status: DC
  Administered 2018-08-09 – 2018-08-10 (×2): 1 via RESPIRATORY_TRACT
  Filled 2018-08-08: qty 28

## 2018-08-08 MED ORDER — FLUTICASONE-UMECLIDIN-VILANT 100-62.5-25 MCG/INH IN AEPB: 1.0000 | INHALATION_SPRAY | Freq: Every day | RESPIRATORY_TRACT | Status: DC

## 2018-08-08 MED ORDER — DOXYCYCLINE HYCLATE 100 MG PO TABS
100.0000 mg | ORAL_TABLET | Freq: Two times a day (BID) | ORAL | Status: DC
  Administered 2018-08-08 – 2018-08-10 (×4): 100 mg via ORAL
  Filled 2018-08-08 (×4): qty 1

## 2018-08-08 MED ORDER — AMLODIPINE BESYLATE 5 MG PO TABS
5.0000 mg | ORAL_TABLET | Freq: Every day | ORAL | Status: DC
  Administered 2018-08-09 – 2018-08-10 (×2): 5 mg via ORAL
  Filled 2018-08-08 (×2): qty 1

## 2018-08-08 MED ORDER — ONDANSETRON HCL 4 MG PO TABS: 4.0000 mg | ORAL_TABLET | Freq: Four times a day (QID) | ORAL | Status: DC | PRN

## 2018-08-08 MED ORDER — ORAL CARE MOUTH RINSE
15.0000 mL | Freq: Two times a day (BID) | OROMUCOSAL | Status: DC
  Administered 2018-08-08 – 2018-08-10 (×4): 15 mL via OROMUCOSAL

## 2018-08-08 MED ORDER — IPRATROPIUM-ALBUTEROL 0.5-2.5 (3) MG/3ML IN SOLN
3.0000 mL | Freq: Four times a day (QID) | RESPIRATORY_TRACT | Status: DC
  Administered 2018-08-08: 3 mL via RESPIRATORY_TRACT
  Filled 2018-08-08: qty 3

## 2018-08-08 MED ORDER — ALBUTEROL SULFATE (2.5 MG/3ML) 0.083% IN NEBU: 2.5000 mg | INHALATION_SOLUTION | RESPIRATORY_TRACT | Status: DC | PRN

## 2018-08-08 MED ORDER — ATORVASTATIN CALCIUM 40 MG PO TABS
80.0000 mg | ORAL_TABLET | Freq: Every day | ORAL | Status: DC
  Administered 2018-08-08 – 2018-08-09 (×2): 80 mg via ORAL
  Filled 2018-08-08 (×2): qty 2

## 2018-08-08 MED ORDER — SODIUM CHLORIDE 0.9% FLUSH
3.0000 mL | Freq: Two times a day (BID) | INTRAVENOUS | Status: DC
  Administered 2018-08-08 – 2018-08-09 (×3): 3 mL via INTRAVENOUS

## 2018-08-08 MED ORDER — IOPAMIDOL (ISOVUE-370) INJECTION 76%
INTRAVENOUS | Status: AC
  Filled 2018-08-08: qty 100

## 2018-08-08 MED ORDER — IOPAMIDOL (ISOVUE-370) INJECTION 76%
100.0000 mL | Freq: Once | INTRAVENOUS | Status: AC | PRN
  Administered 2018-08-08: 100 mL via INTRAVENOUS

## 2018-08-08 MED ORDER — IPRATROPIUM-ALBUTEROL 0.5-2.5 (3) MG/3ML IN SOLN
3.0000 mL | Freq: Once | RESPIRATORY_TRACT | Status: AC
  Administered 2018-08-08: 3 mL via RESPIRATORY_TRACT
  Filled 2018-08-08: qty 3

## 2018-08-08 MED ORDER — FUROSEMIDE 20 MG PO TABS
10.0000 mg | ORAL_TABLET | Freq: Every day | ORAL | Status: DC
  Administered 2018-08-09 – 2018-08-10 (×2): 10 mg via ORAL
  Filled 2018-08-08 (×3): qty 1

## 2018-08-08 MED ORDER — HYDRALAZINE HCL 20 MG/ML IJ SOLN
10.0000 mg | Freq: Four times a day (QID) | INTRAMUSCULAR | Status: DC | PRN
  Administered 2018-08-08: 10 mg via INTRAVENOUS
  Filled 2018-08-08: qty 1

## 2018-08-08 MED ORDER — METHYLPREDNISOLONE SODIUM SUCC 125 MG IJ SOLR
80.0000 mg | Freq: Three times a day (TID) | INTRAMUSCULAR | Status: DC
  Administered 2018-08-08 – 2018-08-10 (×5): 80 mg via INTRAVENOUS
  Filled 2018-08-08 (×5): qty 2

## 2018-08-08 MED ORDER — METHYLPREDNISOLONE SODIUM SUCC 125 MG IJ SOLR
125.0000 mg | Freq: Once | INTRAMUSCULAR | Status: AC
  Administered 2018-08-08: 125 mg via INTRAVENOUS
  Filled 2018-08-08: qty 2

## 2018-08-08 MED ORDER — ACETAMINOPHEN 325 MG PO TABS: 650.0000 mg | ORAL_TABLET | Freq: Four times a day (QID) | ORAL | Status: DC | PRN

## 2018-08-08 MED ORDER — SODIUM CHLORIDE 0.9% FLUSH: 3.0000 mL | INTRAVENOUS | Status: DC | PRN

## 2018-08-08 MED ORDER — ONDANSETRON HCL 4 MG/2ML IJ SOLN: 4.0000 mg | Freq: Four times a day (QID) | INTRAMUSCULAR | Status: DC | PRN

## 2018-08-08 MED ORDER — OLMESARTAN-AMLODIPINE-HCTZ 20-5-12.5 MG PO TABS: 0.5000 | ORAL_TABLET | Freq: Every day | ORAL | 1 refills | Status: DC

## 2018-08-08 MED ORDER — ACETAMINOPHEN 650 MG RE SUPP: 650.0000 mg | Freq: Four times a day (QID) | RECTAL | Status: DC | PRN

## 2018-08-08 MED ORDER — SODIUM CHLORIDE (PF) 0.9 % IJ SOLN
INTRAMUSCULAR | Status: AC
  Filled 2018-08-08: qty 50

## 2018-08-08 MED ORDER — METOPROLOL SUCCINATE ER 50 MG PO TB24
50.0000 mg | ORAL_TABLET | Freq: Every day | ORAL | Status: DC
  Administered 2018-08-09 – 2018-08-10 (×2): 50 mg via ORAL
  Filled 2018-08-08 (×2): qty 1

## 2018-08-08 MED ORDER — ENOXAPARIN SODIUM 40 MG/0.4ML ~~LOC~~ SOLN
40.0000 mg | SUBCUTANEOUS | Status: DC
  Administered 2018-08-08 – 2018-08-09 (×2): 40 mg via SUBCUTANEOUS
  Filled 2018-08-08 (×2): qty 0.4

## 2018-08-08 MED ORDER — UMECLIDINIUM BROMIDE 62.5 MCG/INH IN AEPB
1.0000 | INHALATION_SPRAY | Freq: Every day | RESPIRATORY_TRACT | Status: DC
  Administered 2018-08-09 – 2018-08-10 (×2): 1 via RESPIRATORY_TRACT
  Filled 2018-08-08: qty 7

## 2018-08-08 MED ORDER — IRBESARTAN 150 MG PO TABS
150.0000 mg | ORAL_TABLET | Freq: Every day | ORAL | Status: DC
  Administered 2018-08-09 – 2018-08-10 (×2): 150 mg via ORAL
  Filled 2018-08-08 (×2): qty 1

## 2018-08-08 MED ORDER — IPRATROPIUM-ALBUTEROL 0.5-2.5 (3) MG/3ML IN SOLN
3.0000 mL | Freq: Four times a day (QID) | RESPIRATORY_TRACT | Status: DC
  Administered 2018-08-09 – 2018-08-10 (×5): 3 mL via RESPIRATORY_TRACT
  Filled 2018-08-08 (×5): qty 3

## 2018-08-08 NOTE — ED Provider Notes (Signed)
[facility] DEPT Provider Note   CSN: [id number] Arrival date & time: 08/08/18  1054     History   Chief Complaint Chief Complaint  Patient presents with  . Cough  . Shortness of Breath    HPI George Frazier is a 73 y.o. male.  HPI   73 year old male with increasing dyspnea.  Acute on chronic.  History of underlying COPD, CHF and hypertension.  Chronically on 2 L via nasal cannula at baseline.  He went to his doctor's office today for the same complaint was referred to the emergency room.  He was noted a heart rate in the 130s and oxygen saturation in the 80s on those 2 L.  He had a chest x-ray in the office which was negative for any acute pathology.  No fevers or chills.  No unusual leg pain or swelling.  Chronic cough.  Denies any acute change.  Past Medical History:  Diagnosis Date  . CHF (congestive heart failure) ([place])   . COPD (chronic obstructive pulmonary disease) ([place])   . Hypertension     Patient Active Problem List   Diagnosis Date Noted  . Hyperlipidemia 12/13/2017  . Vitamin D insufficiency 12/13/2017  . Physical deconditioning 11/15/2017  . Hypertension 11/06/2017  . Stopped smoking 2008 with greater than 40 pack year history 11/06/2017  . Chronic fatigue 11/06/2017  . Dyspnea on minimal exertion-likely due to COPD 11/06/2017  . Acute respiratory failure with hypoxia ([place]) 09/17/2017  . CAP (community acquired pneumonia) 07/14/2017  . COPD, group D, by GOLD 2017 classification ([place]) 07/14/2017  . Heart failure ([place]) 07/14/2017    History reviewed. No pertinent surgical history.      Home Medications    Prior to Admission medications   Medication Sig Start Date End Date Taking? Authorizing Provider  albuterol (PROVENTIL HFA;VENTOLIN HFA) 108 (90 Base) MCG/ACT inhaler Inhale 2 puffs into the lungs every 4 (four) hours as needed for wheezing or shortness of breath. 09/21/17  Yes Shahmehdi, Seyed A, MD  atorvastatin  (LIPITOR) 80 MG tablet 1 tablet before bed daily Patient taking differently: Take 80 mg by mouth at bedtime.  01/24/18  Yes Opalski, Deborah, DO  Black Elderberry 50 MG/5ML SYRP Take 10 mLs by mouth daily.   Yes [provider]  furosemide (LASIX) 20 MG tablet Take 0.5 tablets (10 mg total) by mouth daily. 03/12/18  Yes Opalski, Neoma Laming, DO  metoprolol succinate (TOPROL-XL) 50 MG 24 hr tablet Take 1 tablet (50 mg total) by mouth daily. Take with or immediately following a meal. 12/13/17  Yes Opalski, Deborah, DO  Olmesartan-amLODIPine-HCTZ 20-5-12.5 MG TABS Take 0.5 tablets by mouth daily. 0.5tab QD for 1 week, then 1 tab po qd Patient taking differently: Take 0.5 tablets by mouth See admin instructions. Take 0.5 tablet daily for 1 week, then take 1 tablet oral daily 08/08/18  Yes Opalski, Deborah, DO  TRELEGY ELLIPTA 100-62.5-25 MCG/INH AEPB INHALE 1 PUFF INTO THE LUNGS DAILY. Patient taking differently: Inhale 1 puff into the lungs daily.  06/03/18  Yes Mannam, Praveen, MD  Cholecalciferol (VITAMIN D3) 5000 units TABS 5,000 IU OTC vitamin D3 daily. Patient not taking: Reported on 08/08/2018 12/13/17   [name], DO    Family History Family History  Problem Relation Age of Onset  . CAD Mother 70  . Hypertension Mother   . Stroke Mother   . COPD Father 75  . AAA (abdominal aortic aneurysm) Father   . COPD Brother   .  COPD Brother   . Cancer Neg Hx     Social History Social History   Tobacco Use  . Smoking status: Former Smoker    Packs/day: 1.00    Years: 40.00    Pack years: 40.00    Last attempt to quit: 2008    Years since quitting: 12.0  . Smokeless tobacco: Former Network engineer Use Topics  . Alcohol use: No    Frequency: Never  . Drug use: No     Allergies   Patient has no known allergies.   Review of Systems Review of Systems  All systems reviewed and negative, other than as noted in HPI.  Physical Exam Updated Vital Signs BP (!) 164/95    Pulse 81   Temp 98.4 F (36.9 C) (Oral)   Resp 12   Ht 6' (1.829 m)   Wt 72.6 kg   SpO2 97%   BMI 21.70 kg/m   Physical Exam Vitals signs and nursing note reviewed.  Constitutional:      General: He is not in acute distress.    Appearance: He is well-developed.  HENT:     Head: Normocephalic and atraumatic.  Eyes:     General:        Right eye: No discharge.        Left eye: No discharge.     Conjunctiva/sclera: Conjunctivae normal.  Neck:     Musculoskeletal: Neck supple.  Cardiovascular:     Rate and Rhythm: Regular rhythm. Tachycardia present.     Heart sounds: Normal heart sounds. No murmur. No friction rub. No gallop.   Pulmonary:     Effort: Pulmonary effort is normal. Tachypnea present.     Comments: Mild tachypnea. Decreased breath sounds.  Abdominal:     General: There is no distension.     Palpations: Abdomen is soft.     Tenderness: There is no abdominal tenderness.  Musculoskeletal:        General: No tenderness.  Skin:    General: Skin is warm and dry.  Neurological:     Mental Status: He is alert.  Psychiatric:        Behavior: Behavior normal.        Thought Content: Thought content normal.      ED Treatments / Results  Labs (all labs ordered are listed, but only abnormal results are displayed) Labs Reviewed  BASIC METABOLIC PANEL - Abnormal; Notable for the following components:      Result Value   Chloride 97 (*)    CO2 33 (*)    Glucose, Bld 121 (*)    All other components within normal limits  CBC WITH DIFFERENTIAL/PLATELET - Abnormal; Notable for the following components:   WBC 12.5 (*)    RBC 4.07 (*)    Hemoglobin 12.1 (*)    Platelets 481 (*)    Neutro Abs 10.5 (*)    Abs Immature Granulocytes 0.12 (*)    All other components within normal limits  TROPONIN I  BRAIN NATRIURETIC PEPTIDE    EKG EKG Interpretation  Date/Time:  Thursday August 08 2018 17:04:04 EST Ventricular Rate:  94 PR Interval:    QRS Duration: 89 QT  Interval:  349 QTC Calculation: 437 R Axis:   94 Text Interpretation:  Sinus rhythm Right axis deviation some artifact but similar to tracing from 09/2017 Confirmed by Virgel Manifold [phone]) on 08/08/2018 5:09:23 PM   Radiology Dg Chest 2 View  Result Date: 08/08/2018 CLINICAL DATA:  Cough  and congestion EXAM: CHEST - 2 VIEW COMPARISON:  None. FINDINGS: Lungs are hyperexpanded. There is mild scarring in the bases. There is no edema or consolidation. The heart size and pulmonary vascularity are normal. No adenopathy. There is aortic atherosclerosis. There is degenerative change in the thoracic spine. IMPRESSION: Lungs hyperexpanded with slight scarring in the bases. No edema or consolidation. Heart size normal. There is aortic atherosclerosis. Aortic Atherosclerosis (ICD10-I70.0). Electronically Signed   By: Lowella Grip III M.D.   On: 08/08/2018 09:45   Ct Angio Chest Pe W And/or Wo Contrast  Result Date: 08/08/2018 CLINICAL DATA:  Chest pain and shortness of breath EXAM: CT ANGIOGRAPHY CHEST WITH CONTRAST TECHNIQUE: Multidetector CT imaging of the chest was performed using the standard protocol during bolus administration of intravenous contrast. Multiplanar CT image reconstructions and MIPs were obtained to evaluate the vascular anatomy. CONTRAST:  16mL ISOVUE-370 IOPAMIDOL (ISOVUE-370) INJECTION 76% COMPARISON:  09/17/2017 FINDINGS: Cardiovascular: Thoracic aorta demonstrates atherosclerotic calcifications without aneurysmal dilatation or dissection. No cardiac enlargement is seen. Coronary calcifications are seen. The pulmonary artery shows a normal branching pattern without intraluminal filling defect to suggest pulmonary embolism. The lower lobe branches are somewhat limited due to patient motion artifact. No definitive filling defects are seen. Mediastinum/Nodes: The esophagus is within normal limits. No hilar or mediastinal adenopathy is noted. Small hilar lymph nodes are noted and stable.  The thoracic inlet is within normal limits. Lungs/Pleura: Diffuse emphysematous changes are identified with some associated scarring. Right middle lobe atelectatic changes are noted medially. No sizable effusion is noted. There is a somewhat nodular density identified in the right upper lobe best seen on image number 41 of series 6 and image number 74 of series 7. This measures approximately 6 mm and is slightly increased when compared with the prior exam. This may represent some further scarring although follow-up is recommended. Upper Abdomen: Visualized upper abdomen demonstrates central right renal cyst. No obstructive changes are seen. The remainder of the upper abdomen is unremarkable. Musculoskeletal: Degenerative changes of the thoracic spine are seen. Review of the MIP images confirms the above findings. IMPRESSION: No evidence of pulmonary emboli. Emphysematous changes with areas of scarring bilaterally. Right middle lobe atelectasis is seen. 6 mm somewhat nodular density which is increased in the interval from the prior exam and may simply represent some scarring although follow-up is recommended. Non-contrast chest CT at 6-12 months is recommended. If the nodule is stable at time of repeat CT, then future CT at 18-24 months (from today's scan) is considered optional for low-risk patients, but is recommended for high-risk patients. This recommendation follows the consensus statement: Guidelines for Management of Incidental Pulmonary Nodules Detected on CT Images: From the Fleischner Society 2017; Radiology 2017; 284:228-243. Right renal cyst Aortic Atherosclerosis (ICD10-I70.0) and Emphysema (ICD10-J43.9). Electronically Signed   By: Inez Catalina M.D.   On: 08/08/2018 14:03    Procedures Procedures (including critical care time)  Medications Ordered in ED Medications  sodium chloride (PF) 0.9 % injection (has no administration in time range)  methylPREDNISolone sodium succinate (SOLU-MEDROL) 125  mg/2 mL injection 125 mg (has no administration in time range)  ipratropium-albuterol (DUONEB) 0.5-2.5 (3) MG/3ML nebulizer solution 3 mL (has no administration in time range)  iopamidol (ISOVUE-370) 76 % injection 100 mL (100 mLs Intravenous Contrast Given 08/08/18 1342)     Initial Impression / Assessment and Plan / ED Course  I have reviewed the triage vital signs and the nursing notes.  Pertinent labs & imaging results  that were available during my care of the patient were reviewed by me and considered in my medical decision making (see chart for details).    73 year old male with dyspnea.  Resting tachycardia and increased oxygen requirement.  CTA negative for PE or other acute explanatory pathology.  Very hypertensive with known diastolic heart failure.  He does not appear to be volume overloaded though.  Anemia minimal. I cannot say that I obviously appreciate any adventitious breath sounds, but he may potentially just be very tight.  Since being the emergency room his heart rate appeared to settle down.  He was ambulated on 2 L and he desatted to 84%, increasingly tachycardic again and was also complaining of feeling dizzy.  We will try some bronchodilators.  Steroids.  Will check EKG and troponin. Atypical for ACS. Can certainly cause dyspnea but would be very unusual to cause hypoxemia unless concurrent heart failure or cardiogenic shock which is not the clinical picture. Will admit for ongoing treatment/evaluation given this new oxygen requirement.  Final Clinical Impressions(s) / ED Diagnoses   Final diagnoses:  Acute on chronic respiratory failure with hypoxia [facility])    ED Discharge Orders    None       Virgel Manifold, MD 08/08/18 1710

## 2018-08-08 NOTE — Progress Notes (Signed)
Acute Care Office visit  Assessment and plan:  1. Acute respiratory failure with hypoxia ([place])   2. Tachycardia   3. Malignant hypertension   4. COPD, group D, by GOLD 2017 classification ([place])   5. Chronic heart failure, unspecified heart failure type ([place])- diastolic grade I -  on lasix   6. Shortness of breath at rest   7. Essential hypertension- poorly controlled     - Without further workup and treatment planned, risk to pt's morbidity is high with possible prolonged functional impairment and poor health outcomes.  Extensive education provided to patient and family member (nephew) today.  All questions were answered.  - Due to patient's hypoxia, tachycardia, and malignant hypertension, with no clear etiology of exacerbation of CHF, acute pneumonia on chest X-ray etc, he will need to proceed to the emergency room for further workup of his acute tachycardia and hypoxia such as rule out PE, infection, etc.  Hypoxia on intake - SpO2 is 86% on intake.  SpO2 is 92-93% on re-check.  - Heart rate today is 130.  Typically patient's heart rate is in 70's to 80's, and pulse ox with his O2 supplement is typically 96%   1. Ongoing Cough (COPD, Acute Respiratory Failure w/ Hypoxia) - Viral vs Allergic vs Bacterial causes for pt's symptoms reveiwed.    - Treated by Dr. Vaughan Browner for his respiratory illness.  - Chest X-ray ordered today.  Reviewed results of X-ray with patient today. - Reviewed need for follow-up in emergency room today. - Return for 30 minute hospital follow-up a week from now.  - Per patient, the last time he had antibiotics was when he last left the hospital, in March of 2019.  2. Essential Hypertension, Poorly Controlled - Chronic Heart Failure - Per patient, recently ran out of [blood pressure] meds.  Per nephew, patient may have thought he was supposed to be weaning off of blood pressure medications.  - Discussed that patient cannot just stop his medicines.  Reviewed  that patient should take his medicines as prescribed on his med list.  - Discussed CRITICAL NEED for patient to comply with treatment plan.  - Blood pressure critically elevated on intake today. - Discussed changing his medications. - Strongly recommended change in treatment plan today.  See med list below.    Pt was interviewed and evaluated by me in the clinic today for 32.5+ minutes, with over 50% time spent in face to face counseling of patients various medical conditions, treatment plans of those medical conditions including medicine management and lifestyle modification, strategies to improve health and well being; and in coordination of care. SEE ABOVE TREATMENT PLAN FOR DETAILS   Meds ordered this encounter  Medications  . DISCONTD: Olmesartan-amLODIPine-HCTZ 20-5-12.5 MG TABS    Sig: Take 0.5 tablets by mouth daily. 0.5tab QD for 1 week, then 1 tab po qd    Dispense:  45 tablet    Refill:  1    Medications Discontinued During This Encounter  Medication Reason  . Olmesartan-amLODIPine-HCTZ 20-5-12.5 MG TABS Reorder     Orders Placed This Encounter  Procedures  . DG Chest 2 View    Gross side effects, risk and benefits, and alternatives of medications discussed with patient.  Patient is aware that all medications have potential side effects and we are unable to predict every sideeffect or drug-drug interaction that may occur.  Expresses verbal understanding and consents to current therapy plan and treatment regiment.   Education and routine counseling  performed. Handouts provided.  Anticipatory guidance and routine counseling done re: condition, txmnt options and need for follow up. All questions of patient's were answered.  Return photo please, for I will see you in 1 week to 10 days after the hospitalization/ ER visit.  Please see AVS handed out to patient at the end of our visit for additional patient instructions/ counseling done pertaining to today's office  visit.  Note:  This document was partially repared using Dragon voice recognition software and may include unintentional dictation errors.  This document serves as a record of services personally performed by [name], DO. It was created on her behalf by [name], a trained medical scribe. The creation of this record is based on the scribe's personal observations and the provider's statements to them.   I have reviewed the above medical documentation for accuracy and completeness and I concur.  [name], DO 08/11/2018 9:06 PM       Subjective:    Chief Complaint  Patient presents with  . Cough    History of Hospitalization - Mild Diastolic Heart Failure, Grade I (on Echo 07/03/2018) Back in March of 2019, patient was hospitalized for 4.5 days with diagnosis of RSV, acute respiratory on chronic failure, and COPD exacerbation.  He was intubated in the emergency room, then admitted to ICU.  Admitted on 3/4, and extubated on 3/6 (intubated for 2.5 days).  CT obtained for PE; was given doxycycline at that time, Symbicort, and albuterol.  Also had hypotension on admission, questionable systolic vs diastolic heart failure with a normal EF.  Treated by Dr. Vaughan Browner for his respiratory illness.  40 pack-year history of smoking, quit in 2009. Lives with sister-in-law and nephew.  HPI:  Pt presents with Sx for about 10 days, per nephew.  Per nephew, he's had the cough since the first of the year (about 23 days).  Patient states he started feeling bad after Christmas.  Has recently attended birthday parties & events around family & kids.  C/o:  Has recently had sinus congestion settling into his chest, over the course of 3-4 days.  He started coughing up phlegm.  Says that the mucus started off green, but has become white now.  Confirms that he has been feeling more SOB than usual.  Says "it seems like I'm not coughing up as much phlegm" today.  Has had a sinus  headache.   Denies:  Fevers, body aches.  Overall getting:  Feels his symptoms have been getting worse.  Reactive Airway Disease Has been using his albuterol rescue inhaler once or twice per day, no more than twice.  He continues using Trelegy as prescribed.   Patient Care Team    Relationship Specialty Notifications Start End  [name], DO PCP - General Family Medicine  10/22/17   Marshell Garfinkel, MD Consulting Physician Pulmonary Disease  11/06/17    1. HTN HPI:  -  His blood pressure has not been controlled at home.  Pt may or may not be checking it at home.   - Patient has not been taking his blood pressure medication.  States that it ran out.  Per nephew, patient may have been under the impression that he was weaning off of his blood pressure medication.  Patient states that he ran out of lasix after his acute management post-hospital.  - Denies medication S-E   - Smoking Status noted   - He denies new onset of: chest pain, exercise intolerance, shortness of breath, dizziness,  visual changes, headache, lower extremity swelling or claudication.   Last 3 blood pressure readings in our office are as follows: BP Readings from Last 3 Encounters:  08/10/18 (!) 158/76  08/08/18 (!) 168/106  04/30/18 130/80    Filed Weights   08/08/18 0915  Weight: 157 lb 4.8 oz (71.4 kg)    Past medical history, Surgical history, Family history reviewed and noted below, Social history, Allergies, and Medications have been entered into the medical record, reviewed and changed as needed.   No Known Allergies  Review of Systems: - see above HPI for pertinent positives General:   No F/C, wt loss Pulm:   No DIB, pleuritic chest pain Card:  No CP, palpitations Abd:  No n/v/d or pain Ext:  No inc edema from baseline   Objective:   Blood pressure (!) 168/106, pulse (!) 130, temperature 98.7 F (37.1 C), height 6\' 1"  (1.854 m), weight 157 lb 4.8 oz (71.4 kg), SpO2 92 %. Body mass  index is 20.75 kg/m. General: Well Developed, well nourished, appropriate for stated age.  Neuro: Alert and oriented x3, extra-ocular muscles intact, sensation grossly intact.  HEENT: Normocephalic, atraumatic, pupils equal round reactive to light, neck supple, no masses, no painful lymphadenopathy, TM's intact B/L, no acute findings. Nares- patent, clear d/c, OP- clear, mild erythema, No TTP sinuses Skin: Warm and dry, no gross rash. Cardiac: RRR, S1 S2,  no murmurs rubs or gallops.  Respiratory:  Nasal cannula in place for supplementary oxygen.  Very little air movement, decreased aeration globally with coarse breath sounds when audible.  Speaking in full sentences, but barely, with visibly labored breathing. Vascular:  No gross lower ext edema, cap RF less 2 sec. Psych: No HI/SI, judgement and insight good, Euthymic mood. Full Affect.

## 2018-08-08 NOTE — H&P (Signed)
[facility] History and Physical  George Frazier ZDG:[id number] DOB: [birth year] DOA: 08/08/2018   PCP: [name], DO  Specialists: Dr. Vaughan Browner is his pulmonologist  Chief Complaint: Shortness of breath ongoing for 1 week  HPI: George Frazier is a 73 y.o. male with a past medical history of COPD, chronic hypoxic respiratory failure on home oxygen at 2 L/min by nasal cannula, history of essential hypertension who ran out of his blood pressure medications about a week ago, hyperlipidemia who was in his usual state of health till about a week ago when he started developing shortness of breath.  Initially with exertion subsequently when at rest.  He had a cough initially with greenish expectoration subsequently with clear sputum.  Denies any chest pain.  No fever no chills.  No sick contacts.  No recent travel.  No dizziness or lightheadedness.  No nausea or vomiting.  Due to worsening symptoms he went to his primary care provider where he was noted to be tachycardic with heart rate in the 140s and hypoxic with saturations in the 80s.  Patient was promptly sent over to the emergency department.  In the emergency department patient was noted to be tachycardic with heart rate in the 120s.  He was saturating in the 90s.  He underwent a CT angiogram which did not reveal any pulmonary embolism.  He was ambulated in the ED with which his saturations dropped into the 80s again.  He will need hospitalization for further management.  Home Medications: Prior to Admission medications   Medication Sig Start Date End Date Taking? Authorizing Provider  albuterol (PROVENTIL HFA;VENTOLIN HFA) 108 (90 Base) MCG/ACT inhaler Inhale 2 puffs into the lungs every 4 (four) hours as needed for wheezing or shortness of breath. 09/21/17  Yes Shahmehdi, Seyed A, MD  atorvastatin (LIPITOR) 80 MG tablet 1 tablet before bed daily Patient taking differently: Take 80 mg by mouth at bedtime.  01/24/18  Yes Opalski,  Deborah, DO  Black Elderberry 50 MG/5ML SYRP Take 10 mLs by mouth daily.   Yes [provider]  furosemide (LASIX) 20 MG tablet Take 0.5 tablets (10 mg total) by mouth daily. 03/12/18  Yes Opalski, Neoma Laming, DO  metoprolol succinate (TOPROL-XL) 50 MG 24 hr tablet Take 1 tablet (50 mg total) by mouth daily. Take with or immediately following a meal. 12/13/17  Yes Opalski, Deborah, DO  Olmesartan-amLODIPine-HCTZ 20-5-12.5 MG TABS Take 0.5 tablets by mouth daily. 0.5tab QD for 1 week, then 1 tab po qd Patient taking differently: Take 0.5 tablets by mouth daily.  08/08/18  Yes Opalski, Deborah, DO  TRELEGY ELLIPTA 100-62.5-25 MCG/INH AEPB INHALE 1 PUFF INTO THE LUNGS DAILY. Patient taking differently: Inhale 1 puff into the lungs daily.  06/03/18  Yes Mannam, Praveen, MD  Cholecalciferol (VITAMIN D3) 5000 units TABS 5,000 IU OTC vitamin D3 daily. Patient not taking: Reported on 08/08/2018 12/13/17   [name], DO    Allergies: No Known Allergies  Past Medical History: Past Medical History:  Diagnosis Date  . CHF (congestive heart failure) ([place])   . COPD (chronic obstructive pulmonary disease) ([place])   . Hypertension     History reviewed. No pertinent surgical history.  Social History: Patient lives here in [place].  Quit smoking 10 to 15 years ago.  No alcohol use.  No illicit drug use.   Family History:  Family History  Problem Relation Age of Onset  . CAD Mother 72  . Hypertension Mother   . Stroke Mother   .  COPD Father 59  . AAA (abdominal aortic aneurysm) Father   . COPD Brother   . COPD Brother   . Cancer Neg Hx      Review of Systems - History obtained from the patient General ROS: positive for  - fatigue Psychological ROS: negative Ophthalmic ROS: negative ENT ROS: negative Allergy and Immunology ROS: negative Hematological and Lymphatic ROS: negative Endocrine ROS: negative Respiratory ROS: as in hpi Cardiovascular ROS: as in hpi Gastrointestinal  ROS: no abdominal pain, change in bowel habits, or black or bloody stools Genito-Urinary ROS: no dysuria, trouble voiding, or hematuria Musculoskeletal ROS: negative Neurological ROS: no TIA or stroke symptoms Dermatological ROS: negative  Physical Examination  Vitals:   08/08/18 1711 08/08/18 1730 08/08/18 1800 08/08/18 1812  BP: (!) 184/109 (!) 158/92 (!) 165/93   Pulse: 99 93 86   Resp: (!) 21 15 14    Temp:      TempSrc:      SpO2: 93% 94% 96% 94%  Weight:      Height:        BP (!) 165/93   Pulse 86   Temp 98.4 F (36.9 C) (Oral)   Resp 14   Ht 6' (1.829 m)   Wt 72.6 kg   SpO2 94%   BMI 21.70 kg/m   General appearance: alert, cooperative, appears stated age and no distress Head: Normocephalic, without obvious abnormality, atraumatic Eyes: conjunctivae/corneas clear. PERRL, EOM's intact.  Throat: lips, mucosa, and tongue normal; teeth and gums normal Neck: no adenopathy, no carotid bruit, no JVD, supple, symmetrical, trachea midline and thyroid not enlarged, symmetric, no tenderness/mass/nodules Resp: Tachypneic at rest.  Coarse breath sounds bilaterally.  No wheezing.  No use of accessory muscles. Cardio: regular rate and rhythm, S1, S2 normal, no murmur, click, rub or gallop GI: soft, non-tender; bowel sounds normal; no masses,  no organomegaly Extremities: extremities normal, atraumatic, no cyanosis or edema Pulses: 2+ and symmetric Skin: Skin color, texture, turgor normal. No rashes or lesions Lymph nodes: Cervical, supraclavicular, and axillary nodes normal. Neurologic: Alert and oriented x3.  Cranial nerves II to XII intact.  Motor strength equal bilateral upper and lower extremities.   Labs on Admission: I have personally reviewed following labs and imaging studies  CBC: Recent Labs  Lab 08/08/18 1223  WBC 12.5*  NEUTROABS 10.5*  HGB 12.1*  HCT 39.9  MCV 98.0  PLT 620*   Basic Metabolic Panel: Recent Labs  Lab 08/08/18 1223  NA 140  K 3.7  CL  97*  CO2 33*  GLUCOSE 121*  BUN 10  CREATININE 0.98  CALCIUM 8.9   GFR: Estimated Creatinine Clearance: 70 mL/min (by C-G formula based on SCr of 0.98 mg/dL).   Radiological Exams on Admission: Dg Chest 2 View  Result Date: 08/08/2018 CLINICAL DATA:  Cough and congestion EXAM: CHEST - 2 VIEW COMPARISON:  None. FINDINGS: Lungs are hyperexpanded. There is mild scarring in the bases. There is no edema or consolidation. The heart size and pulmonary vascularity are normal. No adenopathy. There is aortic atherosclerosis. There is degenerative change in the thoracic spine. IMPRESSION: Lungs hyperexpanded with slight scarring in the bases. No edema or consolidation. Heart size normal. There is aortic atherosclerosis. Aortic Atherosclerosis (ICD10-I70.0). Electronically Signed   By: Lowella Grip III M.D.   On: 08/08/2018 09:45   Ct Angio Chest Pe W And/or Wo Contrast  Result Date: 08/08/2018 CLINICAL DATA:  Chest pain and shortness of breath EXAM: CT ANGIOGRAPHY CHEST WITH CONTRAST TECHNIQUE:  Multidetector CT imaging of the chest was performed using the standard protocol during bolus administration of intravenous contrast. Multiplanar CT image reconstructions and MIPs were obtained to evaluate the vascular anatomy. CONTRAST:  162mL ISOVUE-370 IOPAMIDOL (ISOVUE-370) INJECTION 76% COMPARISON:  09/17/2017 FINDINGS: Cardiovascular: Thoracic aorta demonstrates atherosclerotic calcifications without aneurysmal dilatation or dissection. No cardiac enlargement is seen. Coronary calcifications are seen. The pulmonary artery shows a normal branching pattern without intraluminal filling defect to suggest pulmonary embolism. The lower lobe branches are somewhat limited due to patient motion artifact. No definitive filling defects are seen. Mediastinum/Nodes: The esophagus is within normal limits. No hilar or mediastinal adenopathy is noted. Small hilar lymph nodes are noted and stable. The thoracic inlet is within  normal limits. Lungs/Pleura: Diffuse emphysematous changes are identified with some associated scarring. Right middle lobe atelectatic changes are noted medially. No sizable effusion is noted. There is a somewhat nodular density identified in the right upper lobe best seen on image number 41 of series 6 and image number 74 of series 7. This measures approximately 6 mm and is slightly increased when compared with the prior exam. This may represent some further scarring although follow-up is recommended. Upper Abdomen: Visualized upper abdomen demonstrates central right renal cyst. No obstructive changes are seen. The remainder of the upper abdomen is unremarkable. Musculoskeletal: Degenerative changes of the thoracic spine are seen. Review of the MIP images confirms the above findings. IMPRESSION: No evidence of pulmonary emboli. Emphysematous changes with areas of scarring bilaterally. Right middle lobe atelectasis is seen. 6 mm somewhat nodular density which is increased in the interval from the prior exam and may simply represent some scarring although follow-up is recommended. Non-contrast chest CT at 6-12 months is recommended. If the nodule is stable at time of repeat CT, then future CT at 18-24 months (from today's scan) is considered optional for low-risk patients, but is recommended for high-risk patients. This recommendation follows the consensus statement: Guidelines for Management of Incidental Pulmonary Nodules Detected on CT Images: From the Fleischner Society 2017; Radiology 2017; 284:228-243. Right renal cyst Aortic Atherosclerosis (ICD10-I70.0) and Emphysema (ICD10-J43.9). Electronically Signed   By: Inez Catalina M.D.   On: 08/08/2018 14:03    My interpretation of Electrocardiogram: Sinus rhythm in the 90s.  Normal axis.  Intervals are normal.  No concerning ST or T wave changes.  Poor quality EKG.   Problem List  Active Problems:   COPD, group D, by GOLD 2017 classification ([place])   Acute  respiratory failure with hypoxia ([place])   Accelerated hypertension   COPD with acute exacerbation ([facility])   Assessment: This is a 73 year old Caucasian male with a past medical history of COPD on home oxygen at 2 L/min who presents with one-week history of worsening shortness of breath.  Initially concern was for pulmonary embolism due to tachycardia and hypoxia.  CT scan however did not show such findings.  Patient symptoms have been ongoing for 1 week.  His presentation is most likely consistent with acute exacerbation of his COPD.  Plan: 1. Acute respiratory failure with hypoxia in the setting of chronic respiratory failure: This is most likely due to COPD exacerbation.  Patient will be admitted to the hospital and started on treatment as outlined below.  Continue oxygen by nasal cannula for now.  Patient last hospitalized for COPD exacerbation in March 2019 when he had to be intubated.  2.  Acute COPD exacerbation: Patient will be treated with nebulizer treatments, systemic steroids, continue with his home inhaler  regimen.  Doxycycline.  3.  Accelerated hypertension: Blood pressure noted to be significantly elevated.  This is likely because patient has not taken his antihypertensives in about a week.  He ran out of his medications.  These medications will be resumed.  Hydralazine intravenously as needed.  4.  Mild normocytic anemia: Likely due to chronic disease.  Mild leukocytosis also noted which could be reactive.  5. Pulmonary nodule: A 6 mm nodular density appreciated in the right upper lobe.  As per CT reported this appears to have increased compared to previous films.  This will need outpatient monitoring/evaluation.   DVT Prophylaxis: Lovenox Code Status: Full code Family Communication: Discussed with the patient and his family Consults called: None  Severity of Illness: The appropriate patient status for this patient is INPATIENT. Inpatient status is judged to be reasonable and  necessary in order to provide the required intensity of service to ensure the patient's safety. The patient's presenting symptoms, physical exam findings, and initial radiographic and laboratory data in the context of their chronic comorbidities is felt to place them at high risk for further clinical deterioration. Furthermore, it is not anticipated that the patient will be medically stable for discharge from the hospital within 2 midnights of admission. The following factors support the patient status of inpatient.   " The patient's presenting symptoms include shortness of breath. " The worrisome physical exam findings include hypoxia. " The initial radiographic and laboratory data are worrisome because of emphysema. " The chronic co-morbidities include hypertension.   * I certify that at the point of admission it is my clinical judgment that the patient will require inpatient hospital care spanning beyond 2 midnights from the point of admission due to high intensity of service, high risk for further deterioration and high frequency of surveillance required.*  Further management decisions will depend on results of further testing and patient's response to treatment.   Angad Nabers [employer]  [facility] operational officer on [email or web address]  08/08/2018, 6:28 PM

## 2018-08-08 NOTE — ED Notes (Signed)
Patient transported to CT 

## 2018-08-08 NOTE — ED Notes (Signed)
Per MD order pulse ox while ambulating. Pt assisted OOB, ambulated w/assistance from [place] to hallway and back to room. Pt pulse 123 bpm; O2 78-84% 2L O2. Pt c/o fatigue and SOB, no dizziness. Upon rtn to room, pt placed on Room O2 and remained 87% 2L O2 for approx 55min. RN/MD advised. [employer]

## 2018-08-08 NOTE — ED Triage Notes (Signed)
Patient went to his PCP today and was told to come to the ED for a possible PE. Patient states he ran out of his BP meds x 1 week . Patient wears O2 2L/min via [place].

## 2018-08-09 LAB — BASIC METABOLIC PANEL
Anion gap: 11 (ref 5–15)
BUN: 14 mg/dL (ref 8–23)
CO2: 31 mmol/L (ref 22–32)
Calcium: 8.6 mg/dL — ABNORMAL LOW (ref 8.9–10.3)
Chloride: 96 mmol/L — ABNORMAL LOW (ref 98–111)
Creatinine, Ser: 1.2 mg/dL (ref 0.61–1.24)
GFR calc Af Amer: >60 mL/min (ref >60)
GLUCOSE: 239 mg/dL — AB (ref 70–99)
Potassium: 3.4 mmol/L — ABNORMAL LOW (ref 3.5–5.1)
Sodium: 138 mmol/L (ref 135–145)

## 2018-08-09 LAB — CBC
HCT: 38.6 % — ABNORMAL LOW (ref 39.0–52.0)
Hemoglobin: 11.8 g/dL — ABNORMAL LOW (ref 13.0–17.0)
MCH: 29.1 pg (ref 26.0–34.0)
MCHC: 30.6 g/dL (ref 30.0–36.0)
MCV: 95.3 fL (ref 80.0–100.0)
Platelets: 462 10*3/uL — ABNORMAL HIGH (ref 150–400)
RBC: 4.05 MIL/uL — ABNORMAL LOW (ref 4.22–5.81)
RDW: 12.8 % (ref 11.5–15.5)
WBC: 12.1 10*3/uL — ABNORMAL HIGH (ref 4.0–10.5)
nRBC: 0 % (ref 0.0–0.2)

## 2018-08-09 LAB — TROPONIN I
Troponin I: <0.03 ng/mL (ref <0.03)
Troponin I: <0.03 ng/mL (ref <0.03)

## 2018-08-09 MED ORDER — SODIUM CHLORIDE 0.45 % IV SOLN
INTRAVENOUS | Status: AC
  Administered 2018-08-09: 14:00:00 via INTRAVENOUS

## 2018-08-09 MED ORDER — POTASSIUM CHLORIDE CRYS ER 20 MEQ PO TBCR
20.0000 meq | EXTENDED_RELEASE_TABLET | Freq: Once | ORAL | Status: AC
  Administered 2018-08-09: 20 meq via ORAL
  Filled 2018-08-09: qty 1

## 2018-08-09 NOTE — Progress Notes (Addendum)
[facility] PROGRESS NOTE  George Frazier KDT:[id number] DOB: May 28, 1946 DOA: 08/08/2018  PCP: [name], DO  Brief History/Interval Summary: 73 y.o. male with a past medical history of COPD, chronic hypoxic respiratory failure on home oxygen at 2 L/min by nasal cannula, history of essential hypertension who ran out of his blood pressure medications about a week prior to admission, hyperlipidemia who was in his usual state of health till about a week ago when he started developing shortness of breath.  Initially with exertion subsequently when at rest.    When he presented to his primary care physician's office his heart rate was in the 140s and he was hypoxic despite his oxygen with saturation in the 80s.  He was sent over to the emergency department.  Subsequently was hospitalized for further management.    Reason for Visit: Acute COPD exacerbation.  Acute respiratory failure with hypoxia.  Consultants: None  Procedures: None  Antibiotics: Doxycycline  Subjective/Interval History: Patient states that he continues to have some difficulty breathing but not as bad as yesterday.  Denies any chest pain.  No nausea vomiting.  ROS: Denies any headaches.  Objective:  Vital Signs  Vitals:   08/09/18 0135 08/09/18 0515 08/09/18 0829 08/09/18 0942  BP: 112/72 128/74  (!) 159/96  Pulse: (!) 107 99  (!) 113  Resp:  18  (!) 23  Temp:  98.3 F (36.8 C)    TempSrc:      SpO2:  94% 97% 95%  Weight:      Height:        Intake/Output Summary (Last 24 hours) at 08/09/2018 1237 Last data filed at 08/09/2018 1000 Gross per 24 hour  Intake 240 ml  Output 400 ml  Net -160 ml   Filed Weights   08/08/18 1100  Weight: 72.6 kg    General appearance: alert, cooperative, appears stated age and no distress Head: Normocephalic, without obvious abnormality, atraumatic Throat: lips, mucosa, and tongue normal; teeth and gums normal Resp: Less tachypneic compared to yesterday.   Improved air entry bilaterally.  No crackles.  No wheezing or rhonchi. Cardio: regular rate and rhythm, S1, S2 normal, no murmur, click, rub or gallop GI: soft, non-tender; bowel sounds normal; no masses,  no organomegaly Extremities: extremities normal, atraumatic, no cyanosis or edema Pulses: 2+ and symmetric Neurologic: Alert and oriented x3.  No focal neurological deficits.  Lab Results:  Data Reviewed: I have personally reviewed following labs and imaging studies  CBC: Recent Labs  Lab 08/08/18 1223 08/09/18 0103  WBC 12.5* 12.1*  NEUTROABS 10.5*  --   HGB 12.1* 11.8*  HCT 39.9 38.6*  MCV 98.0 95.3  PLT 481* 462*    Basic Metabolic Panel: Recent Labs  Lab 08/08/18 1223 08/09/18 0103  NA 140 138  K 3.7 3.4*  CL 97* 96*  CO2 33* 31  GLUCOSE 121* 239*  BUN 10 14  CREATININE 0.98 1.20  CALCIUM 8.9 8.6*    GFR: Estimated Creatinine Clearance: 57.1 mL/min (by C-G formula based on SCr of 1.2 mg/dL).  Cardiac Enzymes: Recent Labs  Lab 08/08/18 1810 08/09/18 0103 08/09/18 0607  TROPONINI <0.03 <0.03 <0.03     Radiology Studies: Dg Chest 2 View  Result Date: 08/08/2018 CLINICAL DATA:  Cough and congestion EXAM: CHEST - 2 VIEW COMPARISON:  None. FINDINGS: Lungs are hyperexpanded. There is mild scarring in the bases. There is no edema or consolidation. The heart size and pulmonary vascularity are normal. No adenopathy. There  is aortic atherosclerosis. There is degenerative change in the thoracic spine. IMPRESSION: Lungs hyperexpanded with slight scarring in the bases. No edema or consolidation. Heart size normal. There is aortic atherosclerosis. Aortic Atherosclerosis (ICD10-I70.0). Electronically Signed   By: Lowella Grip III M.D.   On: 08/08/2018 09:45   Ct Angio Chest Pe W And/or Wo Contrast  Result Date: 08/08/2018 CLINICAL DATA:  Chest pain and shortness of breath EXAM: CT ANGIOGRAPHY CHEST WITH CONTRAST TECHNIQUE: Multidetector CT imaging of the chest  was performed using the standard protocol during bolus administration of intravenous contrast. Multiplanar CT image reconstructions and MIPs were obtained to evaluate the vascular anatomy. CONTRAST:  13mL ISOVUE-370 IOPAMIDOL (ISOVUE-370) INJECTION 76% COMPARISON:  09/17/2017 FINDINGS: Cardiovascular: Thoracic aorta demonstrates atherosclerotic calcifications without aneurysmal dilatation or dissection. No cardiac enlargement is seen. Coronary calcifications are seen. The pulmonary artery shows a normal branching pattern without intraluminal filling defect to suggest pulmonary embolism. The lower lobe branches are somewhat limited due to patient motion artifact. No definitive filling defects are seen. Mediastinum/Nodes: The esophagus is within normal limits. No hilar or mediastinal adenopathy is noted. Small hilar lymph nodes are noted and stable. The thoracic inlet is within normal limits. Lungs/Pleura: Diffuse emphysematous changes are identified with some associated scarring. Right middle lobe atelectatic changes are noted medially. No sizable effusion is noted. There is a somewhat nodular density identified in the right upper lobe best seen on image number 41 of series 6 and image number 74 of series 7. This measures approximately 6 mm and is slightly increased when compared with the prior exam. This may represent some further scarring although follow-up is recommended. Upper Abdomen: Visualized upper abdomen demonstrates central right renal cyst. No obstructive changes are seen. The remainder of the upper abdomen is unremarkable. Musculoskeletal: Degenerative changes of the thoracic spine are seen. Review of the MIP images confirms the above findings. IMPRESSION: No evidence of pulmonary emboli. Emphysematous changes with areas of scarring bilaterally. Right middle lobe atelectasis is seen. 6 mm somewhat nodular density which is increased in the interval from the prior exam and may simply represent some  scarring although follow-up is recommended. Non-contrast chest CT at 6-12 months is recommended. If the nodule is stable at time of repeat CT, then future CT at 18-24 months (from today's scan) is considered optional for low-risk patients, but is recommended for high-risk patients. This recommendation follows the consensus statement: Guidelines for Management of Incidental Pulmonary Nodules Detected on CT Images: From the Fleischner Society 2017; Radiology 2017; 284:228-243. Right renal cyst Aortic Atherosclerosis (ICD10-I70.0) and Emphysema (ICD10-J43.9). Electronically Signed   By: Inez Catalina M.D.   On: 08/08/2018 14:03     Medications:  Scheduled: . amLODipine  5 mg Oral Daily  . atorvastatin  80 mg Oral QHS  . doxycycline  100 mg Oral Q12H  . enoxaparin (LOVENOX) injection  40 mg Subcutaneous Q24H  . fluticasone furoate-vilanterol  1 puff Inhalation Daily  . furosemide  10 mg Oral Daily  . ipratropium-albuterol  3 mL Nebulization QID  . irbesartan  150 mg Oral Daily  . mouth rinse  15 mL Mouth Rinse BID  . methylPREDNISolone (SOLU-MEDROL) injection  80 mg Intravenous Q8H  . metoprolol succinate  50 mg Oral Daily  . sodium chloride flush  3 mL Intravenous Q12H  . umeclidinium bromide  1 puff Inhalation Daily   Continuous: . sodium chloride     KVQ:QVZDGL chloride, acetaminophen **OR** acetaminophen, albuterol, hydrALAZINE, ondansetron **OR** ondansetron (ZOFRAN) IV, sodium chloride  flush    Assessment/Plan:  Acute respiratory failure with hypoxia This is most likely due to COPD exacerbation.  Patient also has chronic hypoxic respiratory failure and is on home oxygen at home.  Seems to be stable.  Troponins were normal.  BNP was 63.  Acute COPD exacerbation Continue nebulizer treatment steroids antibiotics inhalers.  Respiratory status appears to be stable.  Essential hypertension/hypokalemia Patient with known history of hypertension.  However he had not taken his  antihypertensives for a week prior to admission as he ran out of his medications.  These were reinitiated yesterday.  Blood pressure appears to have improved as of this morning.  Continue to monitor.  Potassium will be repleted.  Normocytic anemia Hemoglobin stable.  Likely due to chronic disease.  Leukocytosis probably due to steroids.  Pulmonary nodule A 6 mm nodular density appreciated in the right upper lobe.  As per CT reported this appears to have increased compared to previous films.  This will need outpatient monitoring/evaluation.  DVT Prophylaxis: Lovenox    Code Status: Full code Family Communication: Discussed with the patient Disposition Plan: Management as outlined above.  Mobilize.    LOS: 1 day   George Frazier [employer] on [email or web address]  08/09/2018, 12:37 PM

## 2018-08-09 NOTE — Evaluation (Signed)
Physical Therapy Evaluation Patient Details Name: George Frazier MRN: [id number] DOB: 07-24-1945 Today's Date: 08/09/2018   History of Present Illness  73 yo male admitted with COPD exac, accelerated HTN. Hx of COPD-O2 dep, anemia, HTN, CHF  Clinical Impression  On eval, pt was Min guard assist for mobility. He walked ~100 feet. O2 sat dropped to 84% on 2L [place], dyspnea 2/4 during ambulation. Recommend daily ambulation with nursing supervision. Do not anticipate any f/u PT needs at discharge. Will follow during hospital stay.     Follow Up Recommendations Supervision for mobility/OOB    Equipment Recommendations  None recommended by PT    Recommendations for Other Services       Precautions / Restrictions Precautions Precautions: Fall Precaution Comments: monitor O2. O2 dep Restrictions Weight Bearing Restrictions: No      Mobility  Bed Mobility Overal bed mobility: Independent                Transfers Overall transfer level: Modified independent                  Ambulation/Gait Ambulation/Gait assistance: Min guard Gait Distance (Feet): 100 Feet Assistive device: None Gait Pattern/deviations: Step-through pattern;Decreased stride length;Drifts right/left     General Gait Details: close guard for safety. O2 sat dropped to 84% on 2L [place] O2 during ambulation. Dyspnea 2/4. Pt fatigues fairly easily  Stairs            Wheelchair Mobility    Modified Rankin (Stroke Patients Only)       Balance Overall balance assessment: Needs assistance           Standing balance-Leahy Scale: Good                               Pertinent Vitals/Pain Pain Assessment: No/denies pain    Home Living Family/patient expects to be discharged to:: Private residence Living Arrangements: Other relatives Available Help at Discharge: Family;Available 24 hours/day Type of Home: House Home Access: Stairs to enter   [employer] of Steps:  1 Home Layout: One level Home Equipment: None      Prior Function Level of Independence: Needs assistance   Gait / Transfers Assistance Needed: independent ambulator  ADL's / Homemaking Assistance Needed: family assisting with cooking, cleaning        Hand Dominance        Extremity/Trunk Assessment   Upper Extremity Assessment Upper Extremity Assessment: Overall WFL for tasks assessed    Lower Extremity Assessment Lower Extremity Assessment: Generalized weakness    Cervical / Trunk Assessment Cervical / Trunk Assessment: Normal  Communication   Communication: No difficulties  Cognition Arousal/Alertness: Awake/alert Behavior During Therapy: WFL for tasks assessed/performed Overall Cognitive Status: Within Functional Limits for tasks assessed                                        General Comments      Exercises     Assessment/Plan    PT Assessment Patient needs continued PT services  PT Problem List Decreased mobility;Decreased activity tolerance       PT Treatment Interventions Gait training;Therapeutic activities;Balance training;Patient/family education;Therapeutic exercise;Functional mobility training    PT Goals (Current goals can be found in the Care Plan section)  Acute Rehab PT Goals Patient Stated Goal: home PT Goal Formulation: With patient Time  For Goal Achievement: 08/23/18 Potential to Achieve Goals: Good    Frequency Min 3X/week   Barriers to discharge        Co-evaluation               AM-PAC PT "6 Clicks" Mobility  Outcome Measure Help needed turning from your back to your side while in a flat bed without using bedrails?: None Help needed moving from lying on your back to sitting on the side of a flat bed without using bedrails?: None Help needed moving to and from a bed to a chair (including a wheelchair)?: None Help needed standing up from a chair using your arms (e.g., wheelchair or bedside chair)?:  None Help needed to walk in hospital room?: A Little Help needed climbing 3-5 steps with a railing? : A Little 6 Click Score: 22    End of Session Equipment Utilized During Treatment: Oxygen Activity Tolerance: Patient limited by fatigue Patient left: in bed;with call bell/phone within reach;with bed alarm set   PT Visit Diagnosis: Difficulty in walking, not elsewhere classified (R26.2)    Time: 1594-7076 PT Time Calculation (min) (ACUTE ONLY): 18 min   Charges:   PT Evaluation $PT Eval Moderate Complexity: 1 Mod           Weston Anna, PT Acute Rehabilitation Services Pager: [phone] Office: [phone]

## 2018-08-09 NOTE — Evaluation (Signed)
Occupational Therapy Evaluation Patient Details Name: George Frazier MRN: [id number] DOB: Jan 24, 1946 Today's Date: 08/09/2018    History of Present Illness In the emergency department patient was noted to be tachycardic with heart rate in the 120s.  He was saturating in the 90s.  He underwent a CT angiogram which did not reveal any pulmonary embolism.  He was ambulated in the ED with which his saturations dropped into the 80s again.  He will need hospitalization for further management.Marland Kitchen PNT PMH: COPD, CHF, HTN   Clinical Impression   PATIENT IS 73 YEAR OLD MALE WHO WAS COOPERATIVE DURING THERAPY. PATIENT WAS ABLE TO PERFORM ADL TASKS AND ADL TRANSFERS WITHOUT ASSIST. PATIENT IS SOB WITH ACTIVITY. PATIENT STATES HE SOB IS WORSE AND HE DID NOT WEAR O2 AT HOME. DISCUSSED ENERGY CONSERVATION WITH PATIENT AND HE STATED HE HAS BEEN EDUCATED ON IT BEFORE. PATIENT WAS ABLE TO STATE ENERGY CONSERVATION STRATEGIES. DISCUSSED USING DME TO SIT IN THE SHOWER AND HE STATES THEIR IS ONE IN THE HOUSE BUT HE HAS NEVER USED IT. DISCUSSED WITH PATIENT THAT HE COULD USE IT TO ASSIST WITH ENERGY CONSERVATION.  DISCUSSED PURSED LIP BREATHING AND PATIENT WAS ABLE TO DEMO. NO FURTHER ACUTE OT NEEDS AT THIS TIME.     Follow Up Recommendations  No OT follow up    Equipment Recommendations  None recommended by OT    Recommendations for Other Services       Precautions / Restrictions Precautions Precautions: Fall Restrictions Weight Bearing Restrictions: No      Mobility Bed Mobility Overal bed mobility: Independent                Transfers Overall transfer level: Independent                    Balance                                           ADL either performed or assessed with clinical judgement   ADL Overall ADL's : At baseline                                       General ADL Comments: PATIENT IS CLOSE TO BASELINE. PATIENT STATES HE IS MORE  SOB.      Vision Baseline Vision/History: Wears glasses Wears Glasses: Reading only Patient Visual Report: No change from baseline       Perception     Praxis      Pertinent Vitals/Pain Pain Assessment: No/denies pain     Hand Dominance Right   Extremity/Trunk Assessment Upper Extremity Assessment Upper Extremity Assessment: Overall WFL for tasks assessed           Communication Communication Communication: No difficulties   Cognition Arousal/Alertness: Awake/alert Behavior During Therapy: WFL for tasks assessed/performed Overall Cognitive Status: Within Functional Limits for tasks assessed                                     General Comments       Exercises     Shoulder Instructions      Home Living Family/patient expects to be discharged to:: Private residence Living Arrangements: Other relatives Available Help at Discharge: Family;Available  24 hours/day Type of Home: House Home Access: Stairs to enter [employer] of Steps: 1   Home Layout: One level     Bathroom Shower/Tub: Occupational psychologist: Handicapped height     Home Equipment: None          Prior Functioning/Environment Level of Independence: Independent        Comments: PATIENT FAMILY WAS DOING THE COOKING AND CLEANING.         OT Problem List:        OT Treatment/Interventions:      OT Goals(Current goals can be found in the care plan section) Acute Rehab OT Goals Patient Stated Goal: GO HOME  OT Frequency:     Barriers to D/C:            Co-evaluation              AM-PAC OT "6 Clicks" Daily Activity     Outcome Measure Help from another person eating meals?: None Help from another person taking care of personal grooming?: None Help from another person toileting, which includes using toliet, bedpan, or urinal?: None Help from another person bathing (including washing, rinsing, drying)?: None Help from another person  to put on and taking off regular upper body clothing?: None Help from another person to put on and taking off regular lower body clothing?: None 6 Click Score: 24   End of Session Nurse Communication: (OK THERAPY)  Activity Tolerance: Patient tolerated treatment well Patient left: in bed;with call bell/phone within reach;with bed alarm set                   Time: 1120-1145 OT Time Calculation (min): 25 min Charges:  OT General Charges $OT Visit: 1 Visit OT Evaluation $OT Eval Low Complexity: 1 Low 6 CLICKS  Yexalen Deike 08/09/2018, 12:08 PM

## 2018-08-10 LAB — BASIC METABOLIC PANEL
Anion gap: 8 (ref 5–15)
BUN: 24 mg/dL — ABNORMAL HIGH (ref 8–23)
CO2: 32 mmol/L (ref 22–32)
Calcium: 8.9 mg/dL (ref 8.9–10.3)
Chloride: 98 mmol/L (ref 98–111)
Creatinine, Ser: 1.02 mg/dL (ref 0.61–1.24)
GFR calc Af Amer: >60 mL/min (ref >60)
GFR calc non Af Amer: >60 mL/min (ref >60)
Glucose, Bld: 130 mg/dL — ABNORMAL HIGH (ref 70–99)
POTASSIUM: 4.5 mmol/L (ref 3.5–5.1)
Sodium: 138 mmol/L (ref 135–145)

## 2018-08-10 MED ORDER — DOXYCYCLINE HYCLATE 100 MG PO TABS: 100.0000 mg | ORAL_TABLET | Freq: Two times a day (BID) | ORAL | 0 refills | Status: AC

## 2018-08-10 MED ORDER — PREDNISONE 20 MG PO TABS: ORAL_TABLET | ORAL | 0 refills | Status: DC

## 2018-08-10 MED ORDER — ALBUTEROL SULFATE (2.5 MG/3ML) 0.083% IN NEBU: 2.5000 mg | INHALATION_SOLUTION | Freq: Three times a day (TID) | RESPIRATORY_TRACT | 1 refills | Status: DC

## 2018-08-10 MED ORDER — METOPROLOL SUCCINATE ER 50 MG PO TB24: 50.0000 mg | ORAL_TABLET | Freq: Every day | ORAL | 0 refills | Status: DC

## 2018-08-10 MED ORDER — ALBUTEROL SULFATE (2.5 MG/3ML) 0.083% IN NEBU: 2.5000 mg | INHALATION_SOLUTION | Freq: Three times a day (TID) | RESPIRATORY_TRACT | Status: DC

## 2018-08-10 MED ORDER — OLMESARTAN-AMLODIPINE-HCTZ 20-5-12.5 MG PO TABS: 0.5000 | ORAL_TABLET | Freq: Every day | ORAL | 0 refills | Status: DC

## 2018-08-10 MED ORDER — FUROSEMIDE 20 MG PO TABS: 10.0000 mg | ORAL_TABLET | Freq: Every day | ORAL | 0 refills | Status: DC

## 2018-08-10 NOTE — Progress Notes (Signed)
Patient given discharge instructions, and verbalized an understanding of all discharge instructions.  Patient agrees with discharge plan, and is being discharged in stable medical condition.  Patient given transportation via wheelchair.  Nebulizer delivered to patient prior to discharge.

## 2018-08-10 NOTE — Discharge Instructions (Signed)
Chronic Obstructive Pulmonary Disease Chronic obstructive pulmonary disease (COPD) is a long-term (chronic) lung problem. When you have COPD, it is hard for air to get in and out of your lungs. Usually the condition gets worse over time, and your lungs will never return to normal. There are things you can do to keep yourself as healthy as possible.  Your doctor may treat your condition with: ? Medicines. ? Oxygen. ? Lung surgery.  Your doctor may also recommend: ? Rehabilitation. This includes steps to make your body work better. It may involve a team of specialists. ? Quitting smoking, if you smoke. ? Exercise and changes to your diet. ? Comfort measures (palliative care). Follow these instructions at home: Medicines  Take over-the-counter and prescription medicines only as told by your doctor.  Talk to your doctor before taking any cough or allergy medicines. You may need to avoid medicines that cause your lungs to be dry. Lifestyle  If you smoke, stop. Smoking makes the problem worse. If you need help quitting, ask your doctor.  Avoid being around things that make your breathing worse. This may include smoke, chemicals, and fumes.  Stay active, but remember to rest as well.  Learn and use tips on how to relax.  Make sure you get enough sleep. Most adults need at least 7 hours of sleep every night.  Eat healthy foods. Eat smaller meals more often. Rest before meals. Controlled breathing Learn and use tips on how to control your breathing as told by your doctor. Try:  Breathing in (inhaling) through your nose for 1 second. Then, pucker your lips and breath out (exhale) through your lips for 2 seconds.  Putting one hand on your belly (abdomen). Breathe in slowly through your nose for 1 second. Your hand on your belly should move out. Pucker your lips and breathe out slowly through your lips. Your hand on your belly should move in as you breathe out.  Controlled coughing Learn  and use controlled coughing to clear mucus from your lungs. Follow these steps: 1. Lean your head a little forward. 2. Breathe in deeply. 3. Try to hold your breath for 3 seconds. 4. Keep your mouth slightly open while coughing 2 times. 5. Spit any mucus out into a tissue. 6. Rest and do the steps again 1 or 2 times as needed. General instructions  Make sure you get all the shots (vaccines) that your doctor recommends. Ask your doctor about a flu shot and a pneumonia shot.  Use oxygen therapy and pulmonary rehabilitation if told by your doctor. If you need home oxygen therapy, ask your doctor if you should buy a tool to measure your oxygen level (oximeter).  Make a COPD action plan with your doctor. This helps you to know what to do if you feel worse than usual.  Manage any other conditions you have as told by your doctor.  Avoid going outside when it is very hot, cold, or humid.  Avoid people who have a sickness you can catch (contagious).  Keep all follow-up visits as told by your doctor. This is important. Contact a doctor if:  You cough up more mucus than usual.  There is a change in the color or thickness of the mucus.  It is harder to breathe than usual.  Your breathing is faster than usual.  You have trouble sleeping.  You need to use your medicines more often than usual.  You have trouble doing your normal activities such as getting dressed   or walking around the house. Get help right away if:  You have shortness of breath while resting.  You have shortness of breath that stops you from: ? Being able to talk. ? Doing normal activities.  Your chest hurts for longer than 5 minutes.  Your skin color is more blue than usual.  Your pulse oximeter shows that you have low oxygen for longer than 5 minutes.  You have a fever.  You feel too tired to breathe normally. Summary  Chronic obstructive pulmonary disease (COPD) is a long-term lung problem.  The way your  lungs work will never return to normal. Usually the condition gets worse over time. There are things you can do to keep yourself as healthy as possible.  Take over-the-counter and prescription medicines only as told by your doctor.  If you smoke, stop. Smoking makes the problem worse. This information is not intended to replace advice given to you by your health care provider. Make sure you discuss any questions you have with your health care provider. Document Released: 12/20/2007 Document Revised: 08/07/2016 Document Reviewed: 08/07/2016 [employer] Interactive Patient Education  2019 [employer]  

## 2018-08-10 NOTE — Discharge Summary (Signed)
[facility]  Physician Discharge Summary   Patient ID: George Frazier MRN: [id number] DOB/AGE: 73-Mar-1947 73 y.o.  Admit date: 08/08/2018 Discharge date: 08/10/2018  PCP: George Dance, DO  DISCHARGE DIAGNOSES:  Acute respiratory failure with hypoxia Acute COPD exacerbation, improved Chronic respiratory failure with hypoxia on home oxygen Essential hypertension Normocytic anemia Pulmonary nodule  RECOMMENDATIONS FOR OUTPATIENT FOLLOW UP: 1. Further work-up or monitoring of pulmonary nodule in the outpatient setting can be pursued by his pulmonologist   DISCHARGE CONDITION: fair  Diet recommendation: As before  Filed Weights   08/08/18 1100  Weight: 72.6 kg    INITIAL HISTORY: 73 y.o.malewith a past medical history of COPD, chronic hypoxic respiratory failure on home oxygen at 2 L/min by nasal cannula, history of essential hypertension who ran out of his blood pressure medications about a week prior to admission, hyperlipidemia who was in his usual state of health till about a week ago when he started developing shortness of breath. Initially with exertion subsequently when at rest.   When he presented to his primary care physician's office his heart rate was in the 140s and he was hypoxic despite his oxygen with saturation in the 80s.  He was sent over to the emergency department.  Subsequently was hospitalized for further management.     HOSPITAL COURSE:   Acute on chronic respiratory failure with hypoxia This is most likely due to COPD exacerbation.  Patient also has chronic hypoxic respiratory failure and is on home oxygen at home.  Troponins were normal.  BNP was 63.  Patient symptoms have improved.  He is now back to his baseline.  He does use home oxygen.  Acute COPD exacerbation Patient was admitted to the hospital.  He was given systemic steroids nebulizer treatment antibiotics.  He has significantly improved.  He will be discharged on tapering  doses of steroids.    Essential hypertension/hypokalemia Patient with known history of hypertension.  However he had not taken his antihypertensives for a week prior to admission as he ran out of his medications.    As a result his blood pressure was quite elevated at admission.  He was started back on his home medications with improvement.  Potassium was repleted.    Normocytic anemia Hemoglobin stable.  Likely due to chronic disease.  Leukocytosis probably due to steroids.  Pulmonary nodule A 6 mm nodular density appreciated in the right upper lobe. As per CT reported this appears to have increased compared to previous films. This will need outpatient monitoring/evaluation.  Seen by physical therapy.  He was able to ambulate without much difficulty.  Overall stable.  Okay for discharge home today.   PERTINENT LABS:  The results of significant diagnostics from this hospitalization (including imaging, microbiology, ancillary and laboratory) are listed below for reference.     Labs: Basic Metabolic Panel: Recent Labs  Lab 08/08/18 1223 08/09/18 0103 08/10/18 0611  NA 140 138 138  K 3.7 3.4* 4.5  CL 97* 96* 98  CO2 33* 31 32  GLUCOSE 121* 239* 130*  BUN 10 14 24*  CREATININE 0.98 1.20 1.02  CALCIUM 8.9 8.6* 8.9   CBC: Recent Labs  Lab 08/08/18 1223 08/09/18 0103  WBC 12.5* 12.1*  NEUTROABS 10.5*  --   HGB 12.1* 11.8*  HCT 39.9 38.6*  MCV 98.0 95.3  PLT 481* 462*   Cardiac Enzymes: Recent Labs  Lab 08/08/18 1810 08/09/18 0103 08/09/18 0607  TROPONINI <0.03 <0.03 <0.03   BNP: BNP (last  3 results) Recent Labs    09/17/17 1600 08/08/18 2121  BNP 403.4* 63.5     IMAGING STUDIES Dg Chest 2 View  Result Date: 08/08/2018 CLINICAL DATA:  Cough and congestion EXAM: CHEST - 2 VIEW COMPARISON:  None. FINDINGS: Lungs are hyperexpanded. There is mild scarring in the bases. There is no edema or consolidation. The heart size and pulmonary vascularity are  normal. No adenopathy. There is aortic atherosclerosis. There is degenerative change in the thoracic spine. IMPRESSION: Lungs hyperexpanded with slight scarring in the bases. No edema or consolidation. Heart size normal. There is aortic atherosclerosis. Aortic Atherosclerosis (ICD10-I70.0). Electronically Signed   By: Lowella Grip III M.D.   On: 08/08/2018 09:45   Ct Angio Chest Pe W And/or Wo Contrast  Result Date: 08/08/2018 CLINICAL DATA:  Chest pain and shortness of breath EXAM: CT ANGIOGRAPHY CHEST WITH CONTRAST TECHNIQUE: Multidetector CT imaging of the chest was performed using the standard protocol during bolus administration of intravenous contrast. Multiplanar CT image reconstructions and MIPs were obtained to evaluate the vascular anatomy. CONTRAST:  16mL ISOVUE-370 IOPAMIDOL (ISOVUE-370) INJECTION 76% COMPARISON:  09/17/2017 FINDINGS: Cardiovascular: Thoracic aorta demonstrates atherosclerotic calcifications without aneurysmal dilatation or dissection. No cardiac enlargement is seen. Coronary calcifications are seen. The pulmonary artery shows a normal branching pattern without intraluminal filling defect to suggest pulmonary embolism. The lower lobe branches are somewhat limited due to patient motion artifact. No definitive filling defects are seen. Mediastinum/Nodes: The esophagus is within normal limits. No hilar or mediastinal adenopathy is noted. Small hilar lymph nodes are noted and stable. The thoracic inlet is within normal limits. Lungs/Pleura: Diffuse emphysematous changes are identified with some associated scarring. Right middle lobe atelectatic changes are noted medially. No sizable effusion is noted. There is a somewhat nodular density identified in the right upper lobe best seen on image number 41 of series 6 and image number 74 of series 7. This measures approximately 6 mm and is slightly increased when compared with the prior exam. This may represent some further scarring  although follow-up is recommended. Upper Abdomen: Visualized upper abdomen demonstrates central right renal cyst. No obstructive changes are seen. The remainder of the upper abdomen is unremarkable. Musculoskeletal: Degenerative changes of the thoracic spine are seen. Review of the MIP images confirms the above findings. IMPRESSION: No evidence of pulmonary emboli. Emphysematous changes with areas of scarring bilaterally. Right middle lobe atelectasis is seen. 6 mm somewhat nodular density which is increased in the interval from the prior exam and may simply represent some scarring although follow-up is recommended. Non-contrast chest CT at 6-12 months is recommended. If the nodule is stable at time of repeat CT, then future CT at 18-24 months (from today's scan) is considered optional for low-risk patients, but is recommended for high-risk patients. This recommendation follows the consensus statement: Guidelines for Management of Incidental Pulmonary Nodules Detected on CT Images: From the Fleischner Society 2017; Radiology 2017; 284:228-243. Right renal cyst Aortic Atherosclerosis (ICD10-I70.0) and Emphysema (ICD10-J43.9). Electronically Signed   By: Inez Catalina M.D.   On: 08/08/2018 14:03    DISCHARGE EXAMINATION: Vitals:   08/09/18 2238 08/10/18 0547 08/10/18 0811 08/10/18 1020  BP: (!) 150/90 (!) 148/89  (!) 154/88  Pulse: 82 71 70 85  Resp: 20 20 17    Temp: 98.3 F (36.8 C) 98.2 F (36.8 C)    TempSrc:      SpO2: 95% 94% 94%   Weight:      Height:  General appearance: Awake alert.  In no distress Resp: Normal effort.  Improved aeration bilaterally.  No wheezing or rhonchi. Cardio: S1-S2 is normal regular.  No S3-S4.  No rubs murmurs or bruit GI: Abdomen is soft.  Nontender nondistended.  Bowel sounds are present normal.  No masses organomegaly Extremities: No edema.  Full range of motion of lower extremities. Neurologic: Alert and oriented x3.  No focal neurological deficits.     DISPOSITION: Home  Discharge Instructions    Call MD for:  difficulty breathing, headache or visual disturbances   Complete by:  As directed    Call MD for:  extreme fatigue   Complete by:  As directed    Call MD for:  persistant dizziness or light-headedness   Complete by:  As directed    Call MD for:  persistant nausea and vomiting   Complete by:  As directed    Call MD for:  severe uncontrolled pain   Complete by:  As directed    Call MD for:  temperature >100.4   Complete by:  As directed    Diet - low sodium heart healthy   Complete by:  As directed    Discharge instructions   Complete by:  As directed    Please follow-up with your primary care provider within 1 week.  Take your medications as prescribed.  You were cared for by a hospitalist during your hospital stay. If you have any questions about your discharge medications or the care you received while you were in the hospital after you are discharged, you can call the unit and asked to speak with the hospitalist on call if the hospitalist that took care of you is not available. Once you are discharged, your primary care physician will handle any further medical issues. Please note that NO REFILLS for any discharge medications will be authorized once you are discharged, as it is imperative that you return to your primary care physician (or establish a relationship with a primary care physician if you do not have one) for your aftercare needs so that they can reassess your need for medications and monitor your lab values. If you do not have a primary care physician, you can call [phone] for a physician referral.   Increase activity slowly   Complete by:  As directed         Allergies as of 08/10/2018   No Known Allergies     Medication List    TAKE these medications   albuterol 108 (90 Base) MCG/ACT inhaler Commonly known as:  PROVENTIL HFA;VENTOLIN HFA Inhale 2 puffs into the lungs every 4 (four) hours as needed  for wheezing or shortness of breath. What changed:  Another medication with the same name was added. Make sure you understand how and when to take each.   albuterol (2.5 MG/3ML) 0.083% nebulizer solution Commonly known as:  PROVENTIL Take 3 mLs (2.5 mg total) by nebulization 3 (three) times daily. What changed:  You were already taking a medication with the same name, and this prescription was added. Make sure you understand how and when to take each.   atorvastatin 80 MG tablet Commonly known as:  LIPITOR 1 tablet before bed daily What changed:    how much to take  how to take this  when to take this  additional instructions   Black Elderberry 50 MG/5ML Syrp Take 10 mLs by mouth daily.   doxycycline 100 MG tablet Commonly known as:  VIBRA-TABS Take 1 tablet (  100 mg total) by mouth every 12 (twelve) hours for 4 days.   furosemide 20 MG tablet Commonly known as:  LASIX Take 0.5 tablets (10 mg total) by mouth daily.   metoprolol succinate 50 MG 24 hr tablet Commonly known as:  TOPROL-XL Take 1 tablet (50 mg total) by mouth daily. Take with or immediately following a meal.   Olmesartan-amLODIPine-HCTZ 20-5-12.5 MG Tabs Take 0.5 tablets by mouth daily. 0.5tab QD for 1 week, then 1 tab po qd What changed:  additional instructions   predniSONE 20 MG tablet Commonly known as:  DELTASONE Take 3 tablets once daily orally for 4 days, then take 2 tablets once daily orally for 4 days, then take 1 tablet once daily orally for 4 days, then STOP.   TRELEGY ELLIPTA 100-62.5-25 MCG/INH Aepb Generic drug:  Fluticasone-Umeclidin-Vilant INHALE 1 PUFF INTO THE LUNGS DAILY. What changed:  See the new instructions.   Vitamin D3 125 MCG (5000 UT) Tabs 5,000 IU OTC vitamin D3 daily.            Durable Medical Equipment  (From admission, onward)         Start     Ordered   08/09/18 1238  For home use only DME Nebulizer machine  Once    Question:  Patient needs a nebulizer to  treat with the following condition  Answer:  COPD (chronic obstructive pulmonary disease) ([place])   08/09/18 1237           Follow-up Information    Opalski, Neoma Laming, DO. Schedule an appointment as soon as possible for a visit in 1 week(s).   Specialty:  Family Medicine Contact information: [place] [address] [phone]        Marshell Garfinkel, MD Follow up.   Specialty:  Pulmonary Disease Why:  I will send a message to Dr. Vaughan Browner and his office should reach out to you. Contact information: [place] [address] [phone]           TOTAL DISCHARGE TIME: 35 minutes  [facility] Pager on [email or web address]  08/10/2018, 11:20 AM

## 2018-08-10 NOTE — Care Management (Signed)
Contacted [facility] for neb machine for home. Jonnie Finner RN CCM Case Mgmt phone [phone]

## 2018-08-13 ENCOUNTER — Telehealth: Payer: Self-pay

## 2018-08-13 NOTE — Telephone Encounter (Signed)
Pt has been scheduled for HFU with Derl Barrow, NP 08/20/18 at 10:30. Nothing further is needed.

## 2018-08-13 NOTE — Telephone Encounter (Signed)
-----   Message from Marshell Garfinkel, MD sent at 08/13/2018 10:54 AM EST ----- Thanks for the update.  Margie- can you make hospital follow up with me or NP ----- Message ----- From: Bonnielee Haff, MD Sent: 08/10/2018  11:20 AM EST To: Marshell Garfinkel, MD  Mariel Kansky,  George Frazier was admitted to the hospital with COPD exacerbation.  Discharged with nebulizer machine along with steroids and antibiotics.  He might benefit from seeing you in the office in the next couple of weeks.  Thanks  [employer]

## 2018-08-20 ENCOUNTER — Ambulatory Visit (INDEPENDENT_AMBULATORY_CARE_PROVIDER_SITE_OTHER): Payer: Medicare Other | Admitting: Primary Care

## 2018-08-20 ENCOUNTER — Encounter: Payer: Self-pay | Admitting: Primary Care

## 2018-08-20 VITALS — BP 148/78 | HR 78 | Ht 72.0 in | Wt 157.0 lb

## 2018-08-20 DIAGNOSIS — J449 Chronic obstructive pulmonary disease, unspecified: Secondary | ICD-10-CM | POA: Diagnosis not present

## 2018-08-20 DIAGNOSIS — R911 Solitary pulmonary nodule: Secondary | ICD-10-CM | POA: Insufficient documentation

## 2018-08-20 MED ORDER — FLUTICASONE-UMECLIDIN-VILANT 100-62.5-25 MCG/INH IN AEPB: 1.0000 | INHALATION_SPRAY | Freq: Every day | RESPIRATORY_TRACT | 0 refills | Status: DC

## 2018-08-20 NOTE — Patient Instructions (Addendum)
Patient assistance form for [employer] (Trelegy) - Sample given   CT Chest w/o contrast in 6 months  Re: follow up pulmonary nodule   Follow up in April-May with Dr. Vaughan Browner only

## 2018-08-20 NOTE — Assessment & Plan Note (Addendum)
-   Echocardiogram in 2018 showed EF 28-11%, grade 1 diastolic dysfunction  - Continues Lasix 20mg  daily - Follow up with primary care

## 2018-08-20 NOTE — Progress Notes (Signed)
_0  ID: George Frazier, male    DOB: July 27, 1945, 73 y.o.   MRN: [id number]  Chief Complaint  Patient presents with  . Hospitalization Follow-up    pt states doing well since hospital visit    Referring provider: [name], DO  HPI: 73 year old male, former smoker quit in 2008 (40 pack year hx). PMH COPD group D, CAP, acute respiratory failure with hypoxia, heart failure, hypertension. Patient of Dr. Vaughan Browner, last seen on 04/16/18. Recent hospitalization for acute COPD exacerbation. Maintained on Trelegy, changed from Anoro back in May 2019.   Hospital course 1/23-1/25/20: COPD exacerbation, given systemic steroid and sent home with taper. CTA negative for PE, 38m nodular density in RUL.    08/20/2018 Patient presents today for hospital follow-up. Accompanied by two family members. Feels he is doing a lot better. He has two days left of prednisone taper. On chronic oxygen 2L which is his baseline. Experiencing very little shortness of breath. Denies cough, wheezing or fever. He hasn't noticed much a change with Trelegy, states that it's not working as well as he thought it would and is very expensive. Cost of his inhaler > $300's. States that he met his deductible and will now cost him $150.    No Known Allergies  Immunization History  Administered Date(s) Administered  . Influenza, High Dose Seasonal PF 04/16/2018  . Pneumococcal Polysaccharide-23 04/16/2018    Past Medical History:  Diagnosis Date  . CHF (congestive heart failure) ([facility]   . COPD (chronic obstructive pulmonary disease) ([place]   . Hypertension     Tobacco History: Social History   Tobacco Use  Smoking Status Former Smoker  . Packs/day: 1.00  . Years: 40.00  . Pack years: 40.00  . Last attempt to quit: 2008  . Years since quitting: 12.1  Smokeless Tobacco Former [employer] traffic controllergiven: Not Answered   Outpatient Medications Prior to Visit  Medication Sig Dispense Refill  . albuterol  (PROVENTIL HFA;VENTOLIN HFA) 108 (90 Base) MCG/ACT inhaler Inhale 2 puffs into the lungs every 4 (four) hours as needed for wheezing or shortness of breath. 1 Inhaler 2  . albuterol (PROVENTIL) (2.5 MG/3ML) 0.083% nebulizer solution Take 3 mLs (2.5 mg total) by nebulization 3 (three) times daily. 75 mL 1  . atorvastatin (LIPITOR) 80 MG tablet 1 tablet before bed daily (Patient taking differently: Take 80 mg by mouth at bedtime. ) 90 tablet 3  . Black Elderberry 50 MG/5ML SYRP Take 10 mLs by mouth daily.    . Cholecalciferol (VITAMIN D3) 5000 units TABS 5,000 IU OTC vitamin D3 daily. 90 tablet 3  . furosemide (LASIX) 20 MG tablet Take 0.5 tablets (10 mg total) by mouth daily. 30 tablet 0  . metoprolol succinate (TOPROL-XL) 50 MG 24 hr tablet Take 1 tablet (50 mg total) by mouth daily. Take with or immediately following a meal. 30 tablet 0  . Olmesartan-amLODIPine-HCTZ 20-5-12.5 MG TABS Take 0.5 tablets by mouth daily. 0.5tab QD for 1 week, then 1 tab po qd 30 tablet 0  . predniSONE (DELTASONE) 20 MG tablet Take 3 tablets once daily orally for 4 days, then take 2 tablets once daily orally for 4 days, then take 1 tablet once daily orally for 4 days, then STOP. 24 tablet 0  . TRELEGY ELLIPTA 100-62.5-25 MCG/INH AEPB INHALE 1 PUFF INTO THE LUNGS DAILY. (Patient taking differently: Inhale 1 puff into the lungs daily. ) 60 each 5   No facility-administered medications prior to visit.  Review of Systems  Review of Systems  Constitutional: Negative.   HENT: Negative.   Respiratory: Positive for shortness of breath. Negative for cough and wheezing.   Cardiovascular: Negative.     Physical Exam  BP (!) 148/78 (BP Location: Left Arm, Cuff Size: Normal)   Pulse 78   Ht 6' (1.829 m)   Wt 157 lb (71.2 kg)   SpO2 100%   BMI 21.29 kg/m  Physical Exam Constitutional:      Appearance: Normal appearance.  HENT:     Head: Normocephalic and atraumatic.     Mouth/Throat:     Mouth: Mucous membranes  are moist.     Pharynx: Oropharynx is clear.  Eyes:     Extraocular Movements: Extraocular movements intact.     Pupils: Pupils are equal, round, and reactive to light.  Neck:     Musculoskeletal: Normal range of motion and neck supple.  Cardiovascular:     Rate and Rhythm: Normal rate and regular rhythm.  Pulmonary:     Effort: Pulmonary effort is normal. No respiratory distress.     Breath sounds: No stridor. No wheezing or rhonchi.     Comments: Clear, diminished  Musculoskeletal: Normal range of motion.  Skin:    General: Skin is warm and dry.  Neurological:     Mental Status: He is alert.  Psychiatric:        Mood and Affect: Mood normal.        Behavior: Behavior normal.        Thought Content: Thought content normal.        Judgment: Judgment normal.      Lab Results:  CBC    Component Value Date/Time   WBC 12.1 (H) 08/09/2018 0103   RBC 4.05 (L) 08/09/2018 0103   HGB 11.8 (L) 08/09/2018 0103   HGB 14.5 12/11/2017 0854   HCT 38.6 (L) 08/09/2018 0103   HCT 43.6 12/11/2017 0854   PLT 462 (H) 08/09/2018 0103   PLT 337 12/11/2017 0854   MCV 95.3 08/09/2018 0103   MCV 92 12/11/2017 0854   MCH 29.1 08/09/2018 0103   MCHC 30.6 08/09/2018 0103   RDW 12.8 08/09/2018 0103   RDW 14.0 12/11/2017 0854   LYMPHSABS 1.1 08/08/2018 1223   LYMPHSABS 2.9 12/11/2017 0854   MONOABS 0.7 08/08/2018 1223   EOSABS 0.1 08/08/2018 1223   EOSABS 0.3 12/11/2017 0854   BASOSABS 0.1 08/08/2018 1223   BASOSABS 0.1 12/11/2017 0854    BMET    Component Value Date/Time   NA 138 08/10/2018 0611   NA 138 03/12/2018 0924   K 4.5 08/10/2018 0611   CL 98 08/10/2018 0611   CO2 32 08/10/2018 0611   GLUCOSE 130 (H) 08/10/2018 0611   BUN 24 (H) 08/10/2018 0611   BUN 28 (H) 03/12/2018 0924   CREATININE 1.02 08/10/2018 0611   CALCIUM 8.9 08/10/2018 0611   GFRNONAA >60 08/10/2018 0611   GFRAA >60 08/10/2018 0611    BNP    Component Value Date/Time   BNP 63.5 08/08/2018 2121     ProBNP No results found for: PROBNP  Imaging: Dg Chest 2 View  Result Date: 08/08/2018 CLINICAL DATA:  Cough and congestion EXAM: CHEST - 2 VIEW COMPARISON:  None. FINDINGS: Lungs are hyperexpanded. There is mild scarring in the bases. There is no edema or consolidation. The heart size and pulmonary vascularity are normal. No adenopathy. There is aortic atherosclerosis. There is degenerative change in the thoracic spine. IMPRESSION: Lungs  hyperexpanded with slight scarring in the bases. No edema or consolidation. Heart size normal. There is aortic atherosclerosis. Aortic Atherosclerosis (ICD10-I70.0). Electronically Signed   By: Lowella Grip III M.D.   On: 08/08/2018 09:45   Ct Angio Chest Pe W And/or Wo Contrast  Result Date: 08/08/2018 CLINICAL DATA:  Chest pain and shortness of breath EXAM: CT ANGIOGRAPHY CHEST WITH CONTRAST TECHNIQUE: Multidetector CT imaging of the chest was performed using the standard protocol during bolus administration of intravenous contrast. Multiplanar CT image reconstructions and MIPs were obtained to evaluate the vascular anatomy. CONTRAST:  172m ISOVUE-370 IOPAMIDOL (ISOVUE-370) INJECTION 76% COMPARISON:  09/17/2017 FINDINGS: Cardiovascular: Thoracic aorta demonstrates atherosclerotic calcifications without aneurysmal dilatation or dissection. No cardiac enlargement is seen. Coronary calcifications are seen. The pulmonary artery shows a normal branching pattern without intraluminal filling defect to suggest pulmonary embolism. The lower lobe branches are somewhat limited due to patient motion artifact. No definitive filling defects are seen. Mediastinum/Nodes: The esophagus is within normal limits. No hilar or mediastinal adenopathy is noted. Small hilar lymph nodes are noted and stable. The thoracic inlet is within normal limits. Lungs/Pleura: Diffuse emphysematous changes are identified with some associated scarring. Right middle lobe atelectatic changes are  noted medially. No sizable effusion is noted. There is a somewhat nodular density identified in the right upper lobe best seen on image number 41 of series 6 and image number 74 of series 7. This measures approximately 6 mm and is slightly increased when compared with the prior exam. This may represent some further scarring although follow-up is recommended. Upper Abdomen: Visualized upper abdomen demonstrates central right renal cyst. No obstructive changes are seen. The remainder of the upper abdomen is unremarkable. Musculoskeletal: Degenerative changes of the thoracic spine are seen. Review of the MIP images confirms the above findings. IMPRESSION: No evidence of pulmonary emboli. Emphysematous changes with areas of scarring bilaterally. Right middle lobe atelectasis is seen. 6 mm somewhat nodular density which is increased in the interval from the prior exam and may simply represent some scarring although follow-up is recommended. Non-contrast chest CT at 6-12 months is recommended. If the nodule is stable at time of repeat CT, then future CT at 18-24 months (from today's scan) is considered optional for low-risk patients, but is recommended for high-risk patients. This recommendation follows the consensus statement: Guidelines for Management of Incidental Pulmonary Nodules Detected on CT Images: From the Fleischner Society 2017; Radiology 2017; 284:228-243. Right renal cyst Aortic Atherosclerosis (ICD10-I70.0) and Emphysema (ICD10-J43.9). Electronically Signed   By: MInez CatalinaM.D.   On: 08/08/2018 14:03     Assessment & Plan:   COPD, group D, by GOLD 2017 classification ([place] - Clinically improving from recent COPD exacerbation requiring two day hospitalization  - Complete prednisone taper as prescribed - Continue Trelegy, needs [employer] patient assistance  - Albuterol nebulizer scheduled three times day  - Encourage incentive spirometer use 10x/hr while awake - FU in 2-3 months with Dr. MVaughan Browner   Heart failure ([facility] - Echocardiogram in 2018 showed EF 522-97% grade 1 diastolic dysfunction  - Continues Lasix 2324mdaily - Follow up with primary care   Solitary pulmonary nodule - Former smoker  - CTA on 08/08/18 showed 24m20mUL nodular density - Needs repeat CT chest w/o contrast in 6 months    EliMartyn EhrichP 08/20/2018

## 2018-08-20 NOTE — Assessment & Plan Note (Signed)
-   Former smoker  - CTA on 08/08/18 showed 89mm RUL nodular density - Needs repeat CT chest w/o contrast in 6 months

## 2018-08-20 NOTE — Assessment & Plan Note (Addendum)
-   Clinically improving from recent COPD exacerbation requiring two day hospitalization  - Complete prednisone taper as prescribed - Continue Trelegy, needs [employer] patient assistance  - Albuterol nebulizer scheduled three times day  - Encourage incentive spirometer use 10x/hr while awake - FU in 2-3 months with Dr. Vaughan Browner

## 2018-08-22 ENCOUNTER — Ambulatory Visit (INDEPENDENT_AMBULATORY_CARE_PROVIDER_SITE_OTHER): Payer: Medicare Other | Admitting: Family Medicine

## 2018-08-22 ENCOUNTER — Encounter: Payer: Self-pay | Admitting: Family Medicine

## 2018-08-22 VITALS — BP 146/90 | HR 80 | Temp 98.3°F | Ht 72.0 in | Wt 155.7 lb

## 2018-08-22 DIAGNOSIS — I509 Heart failure, unspecified: Secondary | ICD-10-CM

## 2018-08-22 DIAGNOSIS — J441 Chronic obstructive pulmonary disease with (acute) exacerbation: Secondary | ICD-10-CM

## 2018-08-22 DIAGNOSIS — Z09 Encounter for follow-up examination after completed treatment for conditions other than malignant neoplasm: Secondary | ICD-10-CM

## 2018-08-22 DIAGNOSIS — I10 Essential (primary) hypertension: Secondary | ICD-10-CM

## 2018-08-22 DIAGNOSIS — J449 Chronic obstructive pulmonary disease, unspecified: Secondary | ICD-10-CM

## 2018-08-22 DIAGNOSIS — R911 Solitary pulmonary nodule: Secondary | ICD-10-CM

## 2018-08-22 NOTE — Progress Notes (Signed)
n            Impression and Recommendations:    1. Hospital discharge follow-up   2. Chronic heart failure, unspecified heart failure type ([place])   3. COPD, group D, by GOLD 2017 classification ([place])   4. COPD with acute exacerbation ([place])   5. Hypertension, unspecified type   6. Solitary pulmonary nodule- R middle lobe- followed by pulm     1. Hospital discharge follow-up Patient stable.  Feels back to baseline. -Patient already followed up with pulmonary as per hospital discharge instructions regarding his increasing in size right pulmonary nodule. -Patient has no concerns about eating, defecating etc.  2. Solitary pulmonary nodule- R middle lobe- followed by pulm Followed by pulmonary.  Dr. Vaughan Browner will be following up on this.  Patient has no complaints.  3. COPD, group D, by GOLD 2017 classification ([place]) COPD is at his baseline.  Saturations are good today at 96 versus last time in the mid to low upper 80s  4. COPD with acute exacerbation (HCC) Tolerated doxycycline and prednisone taper well which she was given upon discharge.  5. Hypertension, unspecified type Blood pressure stable.  No change in medications needed at this time.  6. Chronic heart failure, unspecified heart failure type [facility]) Patient is completely asymptomatic without concerns or complaints.   Expresses verbal understanding and consents to current therapy and treatment regimen.  No barriers to understanding were identified.  Red flag symptoms and signs discussed in detail.  Patient expressed understanding regarding what to do in case of emergency\urgent symptoms  Please see AVS handed out to patient at the end of our visit for further patient instructions/ counseling done pertaining to today's office visit.   Return for Follow-up 4 months for hypertension, hyperlipidemia, COPD and CHF.     Note:  This note was prepared with assistance of Dragon voice recognition software. Occasional wrong-word or  sound-a-like substitutions may have occurred due to the inherent limitations of voice recognition software.   --------------------------------------------------------------------------------------------------------------------------------------------------------------------------------------------------------------------------------------------    Subjective:     HPI: George Frazier is a 73 y.o. male who presents to [place] at [facility] today for issues as discussed below.  Patient is here for hospital follow-up.  He was admitted on 123 and discharged on 08/10/2018.  He was seen for acute respiratory failure with hypoxia and acute COPD exacerbation.  At the time of note CT scan did show that right middle lobe showed a 6 mm somewhat nodular density that increased in size from prior CT scan and since then, patient has followed up with his pulmonologist and they are scheduling a repeat CT in 6 to 12 months to follow that.  Since discharge patient states he is back to his baseline with his regular respiratory status.  He was sent home with a prednisone taper pack as well as doxycycline, both which he has been tolerating very well.  Blood pressures at home have been stable around the low 1 40-1 44 over 80s.  This is been consistent.  He denies any hypoxia, shortness of breath or difficulty breathing lying down at night etc.  He denies any chest tightness or pain with ambulation and denies any increase in weight or swelling of his lower extremities.  He has no complaints today.  He has been eating and drinking within normal limits and has no problems with using the bathroom.  He is thankful we sent him and has no acute concerns today.   Wt Readings  from Last 3 Encounters:  08/22/18 155 lb 11.2 oz (70.6 kg)  08/20/18 157 lb (71.2 kg)  08/08/18 160 lb (72.6 kg)   BP Readings from Last 3 Encounters:  08/22/18 (!) 169/109  08/20/18 (!) 148/78  08/10/18 (!) 158/76   Pulse  Readings from Last 3 Encounters:  08/22/18 80  08/20/18 78  08/10/18 73   BMI Readings from Last 3 Encounters:  08/22/18 21.12 kg/m  08/20/18 21.29 kg/m  08/08/18 21.70 kg/m     Patient Care Team    Relationship Specialty Notifications Start End  [name], DO PCP - General Family Medicine  10/22/17   Marshell Garfinkel, MD Consulting Physician Pulmonary Disease  11/06/17      Patient Active Problem List   Diagnosis Date Noted  . Solitary pulmonary nodule- R middle lobe- followed by pulm 08/20/2018  . Accelerated hypertension 08/08/2018  . COPD with acute exacerbation ([place]) 08/08/2018  . Hyperlipidemia 12/13/2017  . Vitamin D insufficiency 12/13/2017  . Physical deconditioning 11/15/2017  . Hypertension 11/06/2017  . Stopped smoking 2008 with greater than 40 pack year history 11/06/2017  . Chronic fatigue 11/06/2017  . Dyspnea on minimal exertion-likely due to COPD 11/06/2017  . Acute respiratory failure with hypoxia ([place]) 09/17/2017  . CAP (community acquired pneumonia) 07/14/2017  . COPD, group D, by GOLD 2017 classification ([place]) 07/14/2017  . Heart failure ([employer]) 07/14/2017    Past Medical history, Surgical history, Family history, Social history, Allergies and Medications have been entered into the medical record, reviewed and changed as needed.    Current Meds  Medication Sig  . albuterol (PROVENTIL HFA;VENTOLIN HFA) 108 (90 Base) MCG/ACT inhaler Inhale 2 puffs into the lungs every 4 (four) hours as needed for wheezing or shortness of breath.  Marland Kitchen albuterol (PROVENTIL) (2.5 MG/3ML) 0.083% nebulizer solution Take 3 mLs (2.5 mg total) by nebulization 3 (three) times daily.  Marland Kitchen atorvastatin (LIPITOR) 80 MG tablet 1 tablet before bed daily (Patient taking differently: Take 80 mg by mouth at bedtime. )  . Black Elderberry 50 MG/5ML SYRP Take 10 mLs by mouth daily.  . Cholecalciferol (VITAMIN D3) 5000 units TABS 5,000 IU OTC vitamin D3 daily.  .  Fluticasone-Umeclidin-Vilant (TRELEGY ELLIPTA) 100-62.5-25 MCG/INH AEPB Inhale 1 puff into the lungs daily.  . furosemide (LASIX) 20 MG tablet Take 0.5 tablets (10 mg total) by mouth daily.  . metoprolol succinate (TOPROL-XL) 50 MG 24 hr tablet Take 1 tablet (50 mg total) by mouth daily. Take with or immediately following a meal.  . Olmesartan-amLODIPine-HCTZ 20-5-12.5 MG TABS Take 0.5 tablets by mouth daily. 0.5tab QD for 1 week, then 1 tab po qd  . TRELEGY ELLIPTA 100-62.5-25 MCG/INH AEPB INHALE 1 PUFF INTO THE LUNGS DAILY. (Patient taking differently: Inhale 1 puff into the lungs daily. )    Allergies:  No Known Allergies   Review of Systems:  A fourteen system review of systems was performed and found to be positive as per HPI.   Objective:   Blood pressure (!) 169/109, pulse 80, temperature 98.3 F (36.8 C), height 6' (1.829 m), weight 155 lb 11.2 oz (70.6 kg), SpO2 96 %. Body mass index is 21.12 kg/m. General:  Well Developed, well nourished, appropriate for stated age.  Neuro:  Alert and oriented,  extra-ocular muscles intact  HEENT:  Normocephalic, atraumatic, neck supple, no carotid bruits appreciated  Skin:  no gross rash, warm, pink. Cardiac:  RRR, S1 S2 Respiratory: Very distant but ECTA B/L and A/P, Not using accessory muscles,  essentially speaking in full sentences- unlabored. Vascular:  Ext warm, no cyanosis apprec.; cap RF less 2 sec. Psych:  No HI/SI, judgement and insight good, Euthymic mood. Full Affect.

## 2018-08-29 ENCOUNTER — Ambulatory Visit: Payer: Medicare Other | Admitting: Family Medicine

## 2018-09-04 ENCOUNTER — Telehealth: Payer: Self-pay

## 2018-09-04 NOTE — Telephone Encounter (Signed)
Patient requesting a refill on albuterol for neb treatment.  Medication was last filled by a pervious provider. Please review and advised. LOV  08/22/2018. MPulliam, CMA/RT(R)

## 2018-09-04 NOTE — Telephone Encounter (Signed)
Ok to rf 1 with 2 RF's.  ONly if he does not have a pulmonologist.  THanks

## 2018-09-05 ENCOUNTER — Other Ambulatory Visit: Payer: Self-pay

## 2018-09-05 DIAGNOSIS — J441 Chronic obstructive pulmonary disease with (acute) exacerbation: Secondary | ICD-10-CM

## 2018-09-05 DIAGNOSIS — J449 Chronic obstructive pulmonary disease, unspecified: Secondary | ICD-10-CM

## 2018-09-05 MED ORDER — ALBUTEROL SULFATE (2.5 MG/3ML) 0.083% IN NEBU: 2.5000 mg | INHALATION_SOLUTION | Freq: Three times a day (TID) | RESPIRATORY_TRACT | 1 refills | Status: AC

## 2018-09-05 NOTE — Telephone Encounter (Signed)
Patient requested a refill on albuterol for nebulizer.  Per Dr. Raliegh Scarlet (PCP) medication would be best managed through pulmonology.  Please review and advise. MPulliam, CMA/RT(R)

## 2018-09-05 NOTE — Telephone Encounter (Signed)
Patient has pulmonologist, sent refill request for their office to review. MPulliam, CMA/RT(R)

## 2018-10-28 ENCOUNTER — Ambulatory Visit: Payer: Medicare Other | Admitting: Pulmonary Disease

## 2018-10-30 ENCOUNTER — Other Ambulatory Visit: Payer: Self-pay | Admitting: Family Medicine

## 2018-10-30 DIAGNOSIS — I5032 Chronic diastolic (congestive) heart failure: Secondary | ICD-10-CM

## 2018-10-30 DIAGNOSIS — I10 Essential (primary) hypertension: Secondary | ICD-10-CM

## 2018-10-30 NOTE — Telephone Encounter (Deleted)
Medication last filled by pervious provider.  LOV 08/22/2018.  Please review and advise. MPulliam, CMA/RT(R)

## 2018-10-30 NOTE — Telephone Encounter (Signed)
Medications had been refilled at the hospital and were previously filled by Dr. Raliegh Scarlet.  Refills sent in for the patient. MPulliam, CMA/RT(R)

## 2018-11-11 ENCOUNTER — Telehealth: Payer: Self-pay | Admitting: Primary Care

## 2018-11-11 NOTE — Telephone Encounter (Signed)
Called and spoke with pt in regards to his upcoming appt he has with Derl Barrow, NP and spoke with his son.  Pt's son states that pt does have a cough but the cough began when pollen started getting bad. Pt has not had any fever, body aches, or chills and has not done any recent travelling nor has he been around anyone that has been sick.  Due to this is why pt's son believes that the cough is allergy related.  Pt does have a mask that he can wear to the scheduled appt. Beth, please advise if you are still okay with pt having the in-office visit that is currently scheduled with you 4/30. Thanks!

## 2018-11-11 NOTE — Telephone Encounter (Signed)
That is fine, we can see him in office. If develops fever, flu like illness or worsening sob/cough let us know prior to visit.

## 2018-11-12 NOTE — Telephone Encounter (Signed)
Called and spoke with pt letting him know that we will still keep his appt as scheduled with Beth on 4/30. Stated to him if he did develop a fever or having any worsening SOB, cough, etc to call the office prior to the appt and pt verbalized understanding. Nothing further needed.

## 2018-11-14 ENCOUNTER — Other Ambulatory Visit: Payer: Self-pay

## 2018-11-14 ENCOUNTER — Encounter: Payer: Self-pay | Admitting: Primary Care

## 2018-11-14 ENCOUNTER — Ambulatory Visit: Payer: Medicare Other | Admitting: Primary Care

## 2018-11-14 ENCOUNTER — Ambulatory Visit (INDEPENDENT_AMBULATORY_CARE_PROVIDER_SITE_OTHER): Payer: Medicare Other | Admitting: Primary Care

## 2018-11-14 VITALS — BP 146/82 | HR 76 | Ht 72.0 in | Wt 163.0 lb

## 2018-11-14 DIAGNOSIS — R911 Solitary pulmonary nodule: Secondary | ICD-10-CM

## 2018-11-14 DIAGNOSIS — J9611 Chronic respiratory failure with hypoxia: Secondary | ICD-10-CM

## 2018-11-14 DIAGNOSIS — J449 Chronic obstructive pulmonary disease, unspecified: Secondary | ICD-10-CM

## 2018-11-14 MED ORDER — FLUTICASONE-UMECLIDIN-VILANT 100-62.5-25 MCG/INH IN AEPB: 1.0000 | INHALATION_SPRAY | Freq: Every day | RESPIRATORY_TRACT | 0 refills | Status: DC

## 2018-11-14 NOTE — Assessment & Plan Note (Signed)
-   Stable  - Continue oxygen at 2L

## 2018-11-14 NOTE — Progress Notes (Signed)
_0  ID: George Frazier, male    DOB: 19-Jan-1946, 73 y.o.   MRN: [id number]  Chief Complaint  Patient presents with  . Follow-up    COPD follow up, denies any changes    Referring provider: [name], DO  HPI: 73 year old male, former smoker quit in 2008 (40 pack year hx). PMH COPD group D, CAP, acute respiratory failure with hypoxia, pulmonary nodule, heart failure, hypertension. Patient of Dr. Vaughan Browner. Recent hospitalization end of January for acute COPD exacerbation. CTA negative for PE, 36m nodular density in RUL. Maintained on Trelegy, changed from Anoro back in May 2019. Cost of his inhaler > $300's. States that he met his deductible and will now cost him $150. On chronic oxygen 2L which is his baseline.   11/14/2018 Patient presents today for follow-up visits. He is doing and feeling well. No acute complaints. He has an occasional cough but nothing significant. He is compliant with using his Trelegy inhaler. Medication does cost him $150 dollars a month. Continues wearing oxygen. Needs CT chest in August. Denies sick contact, fever or wheezing.   No Known Allergies  Immunization History  Administered Date(s) Administered  . Influenza, High Dose Seasonal PF 04/16/2018  . Pneumococcal Polysaccharide-23 04/16/2018     No Known Allergies  Immunization History  Administered Date(s) Administered  . Influenza, High Dose Seasonal PF 04/16/2018  . Pneumococcal Polysaccharide-23 04/16/2018    Past Medical History:  Diagnosis Date  . CHF (congestive heart failure) ([place]   . COPD (chronic obstructive pulmonary disease) ([place]   . Hypertension     Tobacco History: Social History   Tobacco Use  Smoking Status Former Smoker  . Packs/day: 1.00  . Years: 40.00  . Pack years: 40.00  . Last attempt to quit: 2008  . Years since quitting: 12.3  Smokeless Tobacco Former [employer] traffic controllergiven: Not Answered   Outpatient Medications Prior to Visit  Medication Sig  Dispense Refill  . albuterol (PROVENTIL HFA;VENTOLIN HFA) 108 (90 Base) MCG/ACT inhaler Inhale 2 puffs into the lungs every 4 (four) hours as needed for wheezing or shortness of breath. 1 Inhaler 2  . albuterol (PROVENTIL) (2.5 MG/3ML) 0.083% nebulizer solution Take 3 mLs (2.5 mg total) by nebulization 3 (three) times daily. 75 mL 1  . atorvastatin (LIPITOR) 80 MG tablet 1 tablet before bed daily (Patient taking differently: Take 80 mg by mouth at bedtime. ) 90 tablet 3  . Black Elderberry 50 MG/5ML SYRP Take 10 mLs by mouth daily.    . Cholecalciferol (VITAMIN D3) 5000 units TABS 5,000 IU OTC vitamin D3 daily. 90 tablet 3  . furosemide (LASIX) 20 MG tablet Take 0.5 tablets (10 mg total) by mouth daily. 30 tablet 0  . metoprolol succinate (TOPROL-XL) 50 MG 24 hr tablet TAKE 1 TABLET BY MOUTH DAILY. TAKE WITH OR IMMEDIATELY FOLLOWING A MEAL. 90 tablet 1  . Olmesartan-amLODIPine-HCTZ 20-5-12.5 MG TABS TAKE 1/2 TABLET BY MOUTH ONCE DAILY FOR 1 WEEK THEN 1 TABLET ONCE DAILY. 90 tablet 0  . TRELEGY ELLIPTA 100-62.5-25 MCG/INH AEPB INHALE 1 PUFF INTO THE LUNGS DAILY. (Patient taking differently: Inhale 1 puff into the lungs daily. ) 60 each 5  . Fluticasone-Umeclidin-Vilant (TRELEGY ELLIPTA) 100-62.5-25 MCG/INH AEPB Inhale 1 puff into the lungs daily. 1 each 0   No facility-administered medications prior to visit.     Review of Systems  Review of Systems  Constitutional: Negative.   HENT: Negative.   Respiratory: Positive for cough. Negative for  shortness of breath and wheezing.   Cardiovascular: Negative.     Physical Exam  BP (!) 146/82 (BP Location: Left Arm, Cuff Size: Normal)   Pulse 76   Ht 6' (1.829 m)   Wt 163 lb (73.9 kg)   SpO2 96%   BMI 22.11 kg/m  Physical Exam Constitutional:      General: He is not in acute distress.    Appearance: Normal appearance. He is not ill-appearing.  HENT:     Head: Normocephalic and atraumatic.     Mouth/Throat:     Mouth: Mucous membranes  are moist.     Pharynx: Oropharynx is clear.  Cardiovascular:     Rate and Rhythm: Normal rate and regular rhythm.  Pulmonary:     Breath sounds: No wheezing.     Comments: CTA, dim Skin:    General: Skin is warm and dry.  Neurological:     General: No focal deficit present.     Mental Status: He is alert and oriented to person, place, and time. Mental status is at baseline.  Psychiatric:        Mood and Affect: Mood normal.        Behavior: Behavior normal.        Thought Content: Thought content normal.        Judgment: Judgment normal.      Lab Results:  CBC    Component Value Date/Time   WBC 12.1 (H) 08/09/2018 0103   RBC 4.05 (L) 08/09/2018 0103   HGB 11.8 (L) 08/09/2018 0103   HGB 14.5 12/11/2017 0854   HCT 38.6 (L) 08/09/2018 0103   HCT 43.6 12/11/2017 0854   PLT 462 (H) 08/09/2018 0103   PLT 337 12/11/2017 0854   MCV 95.3 08/09/2018 0103   MCV 92 12/11/2017 0854   MCH 29.1 08/09/2018 0103   MCHC 30.6 08/09/2018 0103   RDW 12.8 08/09/2018 0103   RDW 14.0 12/11/2017 0854   LYMPHSABS 1.1 08/08/2018 1223   LYMPHSABS 2.9 12/11/2017 0854   MONOABS 0.7 08/08/2018 1223   EOSABS 0.1 08/08/2018 1223   EOSABS 0.3 12/11/2017 0854   BASOSABS 0.1 08/08/2018 1223   BASOSABS 0.1 12/11/2017 0854    BMET    Component Value Date/Time   NA 138 08/10/2018 0611   NA 138 03/12/2018 0924   K 4.5 08/10/2018 0611   CL 98 08/10/2018 0611   CO2 32 08/10/2018 0611   GLUCOSE 130 (H) 08/10/2018 0611   BUN 24 (H) 08/10/2018 0611   BUN 28 (H) 03/12/2018 0924   CREATININE 1.02 08/10/2018 0611   CALCIUM 8.9 08/10/2018 0611   GFRNONAA >60 08/10/2018 0611   GFRAA >60 08/10/2018 0611    BNP    Component Value Date/Time   BNP 63.5 08/08/2018 2121    ProBNP No results found for: PROBNP  Imaging: No results found.   Assessment & Plan:   COPD, group D, by GOLD 2017 classification ([place]) - Stable; no recent exacerbation - Continue Trelegy 1 puff daily (given sample and  [employer]K patient assistance form) - Asked patient to call insurance to see what inhalers are formulary on plan (ICS/LABA and LAMA)  - Encourage patient stay active   Chronic respiratory failure with hypoxia ([place]) - Stable  - Continue oxygen at 2L   Solitary pulmonary nodule- R middle lobe- followed by pulm - Former smoker - CTA in Jan showed new 22m RUL nodular density  - Needs repeat CT chest in August (ordered)  FU in  3-4 months with Dr. Ashley Mariner, NP 11/14/2018

## 2018-11-14 NOTE — Assessment & Plan Note (Addendum)
-   Stable; no recent exacerbation - Continue Trelegy 1 puff daily (given sample and [employer] patient assistance form) - Asked patient to call insurance to see what inhalers are formulary on plan (ICS/LABA and LAMA)  - Encourage patient stay active

## 2018-11-14 NOTE — Patient Instructions (Addendum)
- Continue Trelegy 1 puff daily (sample given)  - Continue oxygen at 2L  - Will give you paper work for patient assistance for drug  - Call and ask insurance cheaper alternative to Trelegy (ICS/LABA and LAMA)   _____________________ (ICS/LABA) + ___________________________________ George Frazier)  - Needs CT chest in August (already ordered)  - Follow up in August with Dr. Vaughan Browner      COPD and Physical Activity Chronic obstructive pulmonary disease (COPD) is a long-term (chronic) condition that affects the lungs. COPD is a general term that can be used to describe many different lung problems that cause lung swelling (inflammation) and limit airflow, including chronic bronchitis and emphysema. The main symptom of COPD is shortness of breath, which makes it harder to do even simple tasks. This can also make it harder to exercise and be active. Talk with your health care provider about treatments to help you breathe better and actions you can take to prevent breathing problems during physical activity. What are the benefits of exercising with COPD? Exercising regularly is an important part of a healthy lifestyle. You can still exercise and do physical activities even though you have COPD. Exercise and physical activity improve your shortness of breath by increasing blood flow (circulation). This causes your heart to pump more oxygen through your body. Moderate exercise can improve your:  Oxygen use.  Energy level.  Shortness of breath.  Strength in your breathing muscles.  Heart health.  Sleep.  Self-esteem and feelings of self-worth.  Depression, stress, and anxiety levels. Exercise can benefit everyone with COPD. The severity of your disease may affect how hard you can exercise, especially at first, but everyone can benefit. Talk with your health care provider about how much exercise is safe for you, and which activities and exercises are safe for you. What actions can I take to  prevent breathing problems during physical activity?  Sign up for a pulmonary rehabilitation program. This type of program may include: ? Education about lung diseases. ? Exercise classes that teach you how to exercise and be more active while improving your breathing. This usually involves:  Exercise using your lower extremities, such as a stationary bicycle.  About 30 minutes of exercise, 2 to 5 times per week, for 6 to 12 weeks  Strength training, such as push ups or leg lifts. ? Nutrition education. ? Group classes in which you can talk with others who also have COPD and learn ways to manage stress.  If you use an oxygen tank, you should use it while you exercise. Work with your health care provider to adjust your oxygen for your physical activity. Your resting flow rate is different from your flow rate during physical activity.  While you are exercising: ? Take slow breaths. ? Pace yourself and do not try to go too fast. ? Purse your lips while breathing out. Pursing your lips is similar to a kissing or whistling position. ? If doing exercise that uses a quick burst of effort, such as weight lifting:  Breathe in before starting the exercise.  Breathe out during the hardest part of the exercise (such as raising the weights). Where to find support You can find support for exercising with COPD from:  Your health care provider.  A pulmonary rehabilitation program.  Your local health department or community health programs.  Support groups, online or in-person. Your health care provider may be able to recommend support groups. Where to find more information You can find more information about  exercising with COPD from:  American Lung Association: ClassInsider.se.  COPD Foundation: https://www.rivera.net/. Contact a health care provider if:  Your symptoms get worse.  You have chest pain.  You have nausea.  You have a fever.  You have trouble talking or catching your  breath.  You want to start a new exercise program or a new activity. Summary  COPD is a general term that can be used to describe many different lung problems that cause lung swelling (inflammation) and limit airflow. This includes chronic bronchitis and emphysema.  Exercise and physical activity improve your shortness of breath by increasing blood flow (circulation). This causes your heart to provide more oxygen to your body.  Contact your health care provider before starting any exercise program or new activity. Ask your health care provider what exercises and activities are safe for you. This information is not intended to replace advice given to you by your health care provider. Make sure you discuss any questions you have with your health care provider. Document Released: 07/26/2017 Document Revised: 07/26/2017 Document Reviewed: 07/26/2017 Elsevier Interactive Patient Education  2019 [employer].

## 2018-11-14 NOTE — Assessment & Plan Note (Addendum)
-   Former smoker - CTA in Jan showed new 69mm RUL nodular density  - Needs repeat CT chest in August (ordered)

## 2018-12-04 ENCOUNTER — Encounter: Payer: Self-pay | Admitting: Pulmonary Disease

## 2018-12-04 ENCOUNTER — Ambulatory Visit (INDEPENDENT_AMBULATORY_CARE_PROVIDER_SITE_OTHER): Payer: Medicare Other | Admitting: Pulmonary Disease

## 2018-12-04 ENCOUNTER — Other Ambulatory Visit: Payer: Self-pay

## 2018-12-04 DIAGNOSIS — J449 Chronic obstructive pulmonary disease, unspecified: Secondary | ICD-10-CM | POA: Diagnosis not present

## 2018-12-04 NOTE — Progress Notes (Signed)
Virtual Visit via telephone  I connected with George Frazier on 12/04/18 at  2:30 PM EDT by a video enabled telemedicine application and verified that I am speaking with the correct person using two identifiers.  Location: Patient: Home Provider: [facility], [address]   I discussed the limitations of evaluation and management by telemedicine and the availability of in person appointments. The patient expressed understanding and agreed to proceed.  Chief complaint:  Follow-up for COPD Gold D  HPI: 73 year old with history of COPD GOLD D (CAT score 16., multiple exacerbations) on home oxygen with recurrent admissions for COPD exacerbation. Reports chronic dyspnea on exertion, cough with white mucus.  Denies any fevers, chills  Hospitalized in January and March 2019 for COPD exacerbation in setting of community-acquired pneumonia, RSV infection.  He was intubated briefly in March 2019  Pets: Cat, no dogs, birds or farm animals Occupation: Worked in [employer] and [employer]  Currently retired Exposures: No known exposures, no asbestos, hot tub, mold, Smoking history: 40-pack-year smoking history.  Quit in 2009 Travel History: He has a strong family history of emphysema in with his father and his brothers who are all smokers.  Interim history: States that breathing is doing stable.  Continues on Trelegy inhaler.  Does not need to use rescue medication He has occasional cough with clear mucus.  No fevers or chills   Observations/Objective: Stable breathing  CTA 08/08/2018: Diffuse emphysematous changes, right middle lobe atelectatic change.  Nodular density in the right upper lobe ring 7.4 mm  PFTs 11/23/2017- FVC 2.16 (49%], FEV1 0.78 [24%], F/F 36, TLC 94%, DLCO 30% Severe obstruction and diffusion impairment  Labs CBC 09/17/17-WBC 12.3, eos 0% CBC 10/22/2017-WBC 11.3, eos 2.2%, absolute eosinophil count 200  Alpha-1 antitrypsin  10/22/2017-235, PI MM  Echocardiogram 07/15/2017- LVEF 86-75%, grade 1 diastolic dysfunction, mild dilation of left atrium.  Assessment and Plan: COPD Gold D, oxygen dependent Continues on trelegy inhaler, continue supplemental oxygen Does not work to attend cardiopulmonary rehab.  Abnormal CT, lung nodule CT ordered for June 2020  Follow Up Instructions: Continue Trelegy CT chest in June   I discussed the assessment and treatment plan with the patient. The patient was provided an opportunity to ask questions and all were answered. The patient agreed with the plan and demonstrated an understanding of the instructions.   The patient was advised to call back or seek an in-person evaluation if the symptoms worsen or if the condition fails to improve as anticipated.  I provided 25 minutes of non-face-to-face time during this encounter.  Marshell Garfinkel MD [facility] 12/04/2018, 3:00 PM

## 2018-12-04 NOTE — Patient Instructions (Signed)
Glad you are doing well with the breathing.  Continue the inhalers as prescribed He had a CT scan scheduled for later this year Follow-up in clinic in 6 months.

## 2018-12-19 ENCOUNTER — Ambulatory Visit: Payer: Medicare Other | Admitting: Family Medicine

## 2018-12-23 ENCOUNTER — Other Ambulatory Visit: Payer: Self-pay

## 2018-12-23 ENCOUNTER — Ambulatory Visit (INDEPENDENT_AMBULATORY_CARE_PROVIDER_SITE_OTHER): Payer: Medicare Other | Admitting: Family Medicine

## 2018-12-23 ENCOUNTER — Encounter: Payer: Self-pay | Admitting: Family Medicine

## 2018-12-23 VITALS — BP 138/85 | HR 59 | Ht 72.0 in | Wt 163.2 lb

## 2018-12-23 DIAGNOSIS — R911 Solitary pulmonary nodule: Secondary | ICD-10-CM | POA: Diagnosis not present

## 2018-12-23 DIAGNOSIS — Z87891 Personal history of nicotine dependence: Secondary | ICD-10-CM | POA: Diagnosis not present

## 2018-12-23 DIAGNOSIS — Z87898 Personal history of other specified conditions: Secondary | ICD-10-CM | POA: Insufficient documentation

## 2018-12-23 DIAGNOSIS — J449 Chronic obstructive pulmonary disease, unspecified: Secondary | ICD-10-CM

## 2018-12-23 DIAGNOSIS — I509 Heart failure, unspecified: Secondary | ICD-10-CM

## 2018-12-23 DIAGNOSIS — I10 Essential (primary) hypertension: Secondary | ICD-10-CM | POA: Diagnosis not present

## 2018-12-23 DIAGNOSIS — R5381 Other malaise: Secondary | ICD-10-CM | POA: Diagnosis not present

## 2018-12-23 DIAGNOSIS — E559 Vitamin D deficiency, unspecified: Secondary | ICD-10-CM

## 2018-12-23 DIAGNOSIS — E785 Hyperlipidemia, unspecified: Secondary | ICD-10-CM

## 2018-12-23 NOTE — Progress Notes (Signed)
Telehealth office visit note for [name], D.O- at Primary Care at [facility]   I connected with current patient today and verified that I am speaking with the correct person using two identifiers.   . Location of the patient: Home . Location of the provider: Office Only the patient (+/- their family members at pt's discretion) and myself were participating in the encounter    - This visit type was conducted due to national recommendations for restrictions regarding the COVID-19 Pandemic (e.g. social distancing) in an effort to limit this patient's exposure and mitigate transmission in our community.  This format is felt to be most appropriate for this patient at this time.   - The patient did not have access to video technology or had technical difficulties with video requiring transitioning to audio format only. - No physical exam could be performed with this format, beyond that communicated to [place] by the patient/ family members as noted.   - Additionally my office staff/ schedulers discussed with the patient that there may be a monetary charge related to this service, depending on their medical insurance.   The patient expressed understanding, and agreed to proceed.       History of Present Illness:  Recently had a telephone appt with his Pulmonologist- [name].   No changes to meds    HTN:  Doing real well.  138/85, and right around there.  No Chest pain, no DOB more than his normal.     CHOL HPI:  -  He  is currently managed with:  See med list from today - Txmnt compliance- at night-= been very consistent  - Patient reports very little compliance with low chol/ saturated and trans fat diet.  No exercise - No other s-e  Last lipid panel as follows:  Lab Results  Component Value Date   CHOL 118 07/03/2018   HDL 40 07/03/2018   LDLCALC 61 07/03/2018   TRIG 83 07/03/2018   CHOLHDL 3.0 07/03/2018   Hepatic Function Latest Ref Rng & Units 03/12/2018 12/11/2017  09/18/2017  Total Protein 6.0 - 8.5 g/dL 6.4 6.8 6.0(L)  Albumin 3.5 - 4.8 g/dL 4.1 4.5 3.0(L)  AST 0 - 40 IU/L 20 22 53(H)  ALT 0 - 44 IU/L 29 17 48  Alk Phosphatase 39 - 117 IU/L 123(H) 112 57  Total Bilirubin 0.0 - 1.2 mg/dL 1.1 0.2 0.9  Bilirubin, Direct 0.1 - 0.5 mg/dL - - 0.2    Vit D:   Taking supp as prescribed.    COPD:  Really hot and humid conditions make it more diff to breathe.   Not using albuterol regularly unless having diff day   CHF: No symptoms at all.  He sleeps on 2 pillows and has for some time now.  No orthopnea, paroxysmal nocturnal dyspnea and his dyspnea on exertion is no worse than his usual due to his COPD    Impression and Recommendations:    1. Chronic heart failure, unspecified heart failure type ([place])   2. COPD, group D, by GOLD 2017 classification ([place])   3. Solitary pulmonary nodule- R middle lobe- followed by pulm   4. Hyperlipidemia, unspecified hyperlipidemia type   5. Hypertension, unspecified type   6. Stopped smoking 2008 with greater than 40 pack year history   7. Vitamin D insufficiency   8. Physical deconditioning   9. History of prediabetes-  A1c 6.1 in December 2018     -CHF symptoms-stable.  Patient has been feeling well.  He continues to follow-up with cardiology for this as well. COPD: Symptoms are great.  They are very stable and patient sounds great on the phone without much conversational dyspnea at all.  This is an improvement from prior times.  He follows with the pulmonology team for this.  They also follow him for the solitary pulmonary nodule.  They see him every 6 months or more frequently if there are problems. Hypertension: Stable at home.  He is taking his medicines-metoprolol and olmesartan\amlodipine\hydrochlorothiazide regularly.  Tolerating them all well.  Blood pressures well controlled.  No complaints.  Continue meds and current treatment plan Hyperlipidemia: 1 year ago LDL was 180 and 5 months ago it was 61 and at  goal.  He is taking Lipitor regularly.  We will continue current regimen and recheck blood work in the near future Vitamin D: We will recheck this sometime in the near future.  Is been about a year since last evaluated. History of prediabetes: Back in 07/15/2017 his A1c was 6.1.  When we rechecked 1 year ago it was 5.3.  We will continue to monitor yearly  - As part of my medical decision making, I reviewed the following data within the [place] History obtained from pt /family, CMA notes reviewed and incorporated if applicable, Labs reviewed, Radiograph/ tests reviewed if applicable and OV notes from prior OV's with me, as well as other specialists she/he has seen since seeing me last, were all reviewed and used in my medical decision making process today.   - Additionally, discussion had with patient regarding txmnt plan, and their biases/concerns about that plan were used in my medical decision making today.   - The patient agreed with the plan and demonstrated an understanding of the instructions.   No barriers to understanding were identified.   - Red flag symptoms and signs discussed in detail.  Patient expressed understanding regarding what to do in case of emergency\ urgent symptoms.  The patient was advised to call back or seek an in-person evaluation if the symptoms worsen or if the condition fails to improve as anticipated.   Return for medicare wellness and will come in for bldwrk after the ov on seperate day..   I provided 23 minutes of non-face-to-face time during this encounter,with over 50% of the time in direct counseling on patients medical conditions/ medical concerns.  Additional time was spent with charting and coordination of care after the actual visit commenced.   Note:  This note was prepared with assistance of Dragon voice recognition software. Occasional wrong-word or sound-a-like substitutions may have occurred due to the inherent limitations of voice  recognition software.  [name], DO 12/23/2018 10:24 AM     Patient Care Team    Relationship Specialty Notifications Start End  [name], DO PCP - General Family Medicine  10/22/17   Marshell Garfinkel, MD Consulting Physician Pulmonary Disease  11/06/17      -Vitals obtained; medications/ allergies reconciled;  personal medical, social, Sx etc.histories were updated by CMA, reviewed by me and are reflected in chart   Patient Active Problem List   Diagnosis Date Noted  . History of prediabetes-  A1c 6.1 in December 2018 12/23/2018    Priority: High  . Hyperlipidemia 12/13/2017    Priority: High  . Hypertension 11/06/2017    Priority: High  . Stopped smoking 2008 with greater than 40 pack year history 11/06/2017    Priority: High  . Heart  failure ([place]) 07/14/2017    Priority: High  . Solitary pulmonary nodule- R middle lobe- followed by pulm 08/20/2018    Priority: Medium  . Dyspnea on minimal exertion-likely due to COPD 11/06/2017    Priority: Medium  . Chronic respiratory failure with hypoxia ([place]) 09/17/2017    Priority: Medium  . COPD, group D, by GOLD 2017 classification ([place]) 07/14/2017    Priority: Medium  . Vitamin D insufficiency 12/13/2017    Priority: Low  . Accelerated hypertension 08/08/2018  . Physical deconditioning 11/15/2017  . Chronic fatigue 11/06/2017  . CAP (community acquired pneumonia) 07/14/2017     Current Meds  Medication Sig  . albuterol (PROVENTIL HFA;VENTOLIN HFA) 108 (90 Base) MCG/ACT inhaler Inhale 2 puffs into the lungs every 4 (four) hours as needed for wheezing or shortness of breath.  Marland Kitchen albuterol (PROVENTIL) (2.5 MG/3ML) 0.083% nebulizer solution Take 3 mLs (2.5 mg total) by nebulization 3 (three) times daily.  Marland Kitchen atorvastatin (LIPITOR) 80 MG tablet 1 tablet before bed daily (Patient taking differently: Take 80 mg by mouth at bedtime. )  . Black Elderberry 50 MG/5ML SYRP Take 10 mLs by mouth daily.  . Cholecalciferol  (VITAMIN D3) 5000 units TABS 5,000 IU OTC vitamin D3 daily.  . furosemide (LASIX) 20 MG tablet Take 0.5 tablets (10 mg total) by mouth daily.  . metoprolol succinate (TOPROL-XL) 50 MG 24 hr tablet TAKE 1 TABLET BY MOUTH DAILY. TAKE WITH OR IMMEDIATELY FOLLOWING A MEAL.  . Olmesartan-amLODIPine-HCTZ 20-5-12.5 MG TABS TAKE 1/2 TABLET BY MOUTH ONCE DAILY FOR 1 WEEK THEN 1 TABLET ONCE DAILY. (Patient taking differently: Take 1 tablet by mouth daily. )     Allergies:  No Known Allergies   ROS:  See above HPI for pertinent positives and negatives   Objective:   Blood pressure 138/85, pulse (!) 59, height 6' (1.829 m), weight 163 lb 3.2 oz (74 kg).  (if some vitals are omitted, this means that patient was UNABLE to obtain them even though they were asked to get them prior to OV today.  They were asked to call us at their earliest convenience with these once obtained. )  General: A & O * 3; sounds in no acute distress; in usual state of health Skin: Pt confirms warm and dry extremities and pink fingertips HEENT: Pt confirms lips non-cyanotic Chest: Patient confirms normal chest excursion and movement Respiratory: speaking in full sentences, no conversational dyspnea; patient confirms no use of accessory muscles Psych: insight appears good, mood- appears full

## 2018-12-28 ENCOUNTER — Other Ambulatory Visit: Payer: Self-pay | Admitting: Pulmonary Disease

## 2018-12-28 DIAGNOSIS — I10 Essential (primary) hypertension: Secondary | ICD-10-CM

## 2018-12-28 DIAGNOSIS — I5032 Chronic diastolic (congestive) heart failure: Secondary | ICD-10-CM

## 2018-12-31 ENCOUNTER — Other Ambulatory Visit: Payer: Self-pay

## 2018-12-31 DIAGNOSIS — I10 Essential (primary) hypertension: Secondary | ICD-10-CM

## 2018-12-31 DIAGNOSIS — I5032 Chronic diastolic (congestive) heart failure: Secondary | ICD-10-CM

## 2018-12-31 NOTE — Telephone Encounter (Signed)
Pharmacy sent refill request for furosemide 20 mg.  Medication last filled by a pervious provider.  LOV 12/23/2018  Please review and advise. MPulliam, CMA/RT(R)

## 2019-01-01 MED ORDER — FUROSEMIDE 20 MG PO TABS: 10.0000 mg | ORAL_TABLET | Freq: Every day | ORAL | 1 refills | Status: DC

## 2019-01-31 ENCOUNTER — Other Ambulatory Visit: Payer: Self-pay | Admitting: Family Medicine

## 2019-01-31 ENCOUNTER — Other Ambulatory Visit: Payer: Self-pay | Admitting: Pulmonary Disease

## 2019-01-31 DIAGNOSIS — E785 Hyperlipidemia, unspecified: Secondary | ICD-10-CM

## 2019-02-19 ENCOUNTER — Encounter (HOSPITAL_COMMUNITY): Payer: Self-pay

## 2019-02-19 ENCOUNTER — Ambulatory Visit (HOSPITAL_COMMUNITY)
Admission: RE | Admit: 2019-02-19 | Discharge: 2019-02-19 | Disposition: A | Discharge status: Home / Self Care | Payer: Medicare Other | Source: Ambulatory Visit | Attending: Primary Care | Admitting: Primary Care

## 2019-02-19 ENCOUNTER — Other Ambulatory Visit: Payer: Self-pay

## 2019-02-19 DIAGNOSIS — R911 Solitary pulmonary nodule: Secondary | ICD-10-CM | POA: Insufficient documentation

## 2019-02-19 DIAGNOSIS — R0602 Shortness of breath: Secondary | ICD-10-CM | POA: Diagnosis not present

## 2019-02-19 IMAGING — CT CT CHEST WITHOUT CONTRAST
2 of 6 series · 15 of 36 positions shown, 18 images · non-contrast
Comparison: [DATE], [DATE].

CLINICAL DATA: Shortness of breath, follow-up pulmonary nodule

EXAM:
CT CHEST WITHOUT CONTRAST
TECHNIQUE: Multidetector CT imaging of the chest was performed following the
standard protocol without IV contrast.

[Series 3: thins · axial · 0.72mm/px · z∈[-316,-16]mm · 12 of 711 slices shown, 15 images]
[im 55/711  mediastinal]
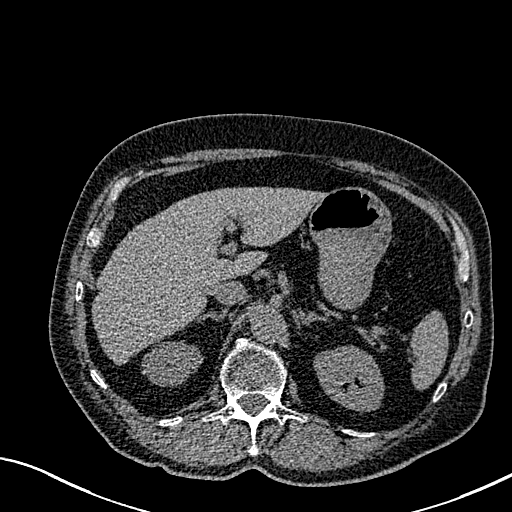
[im 55/711  lung]
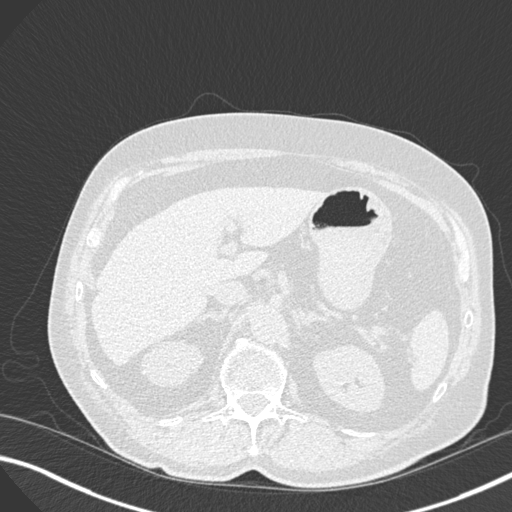
[im 110/711  lung]
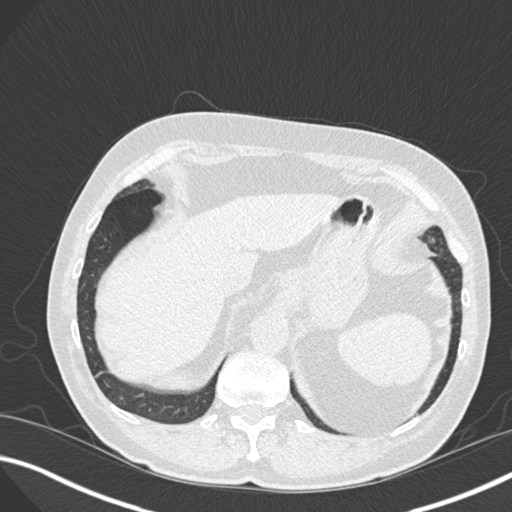
[im 164/711  lung]
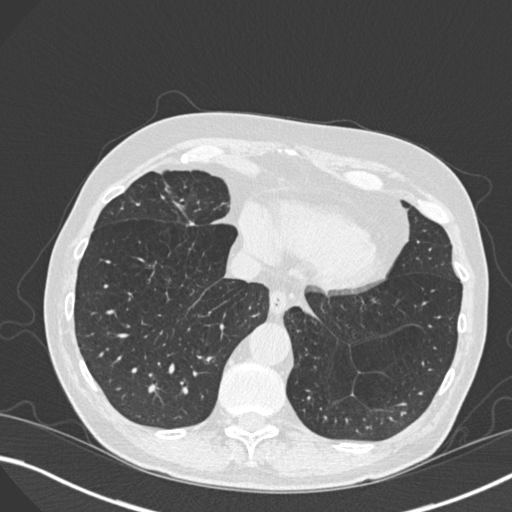
[im 219/711  lung]
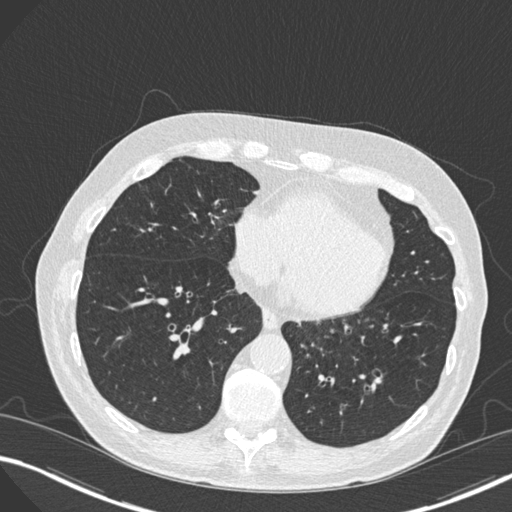
[im 274/711  mediastinal]
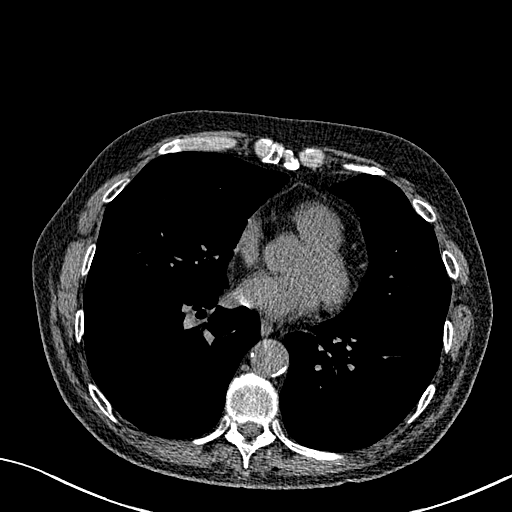
[im 274/711  lung]
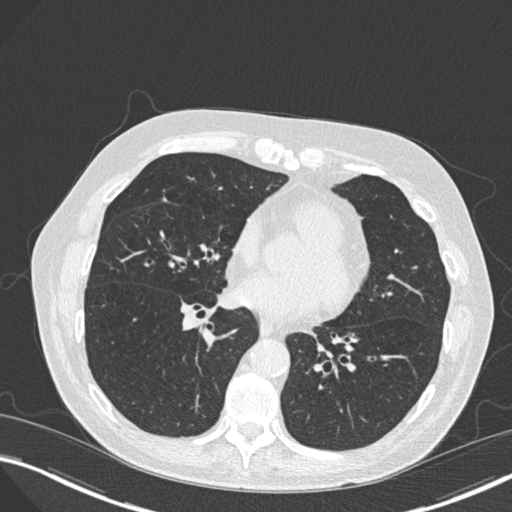
[im 328/711  lung]
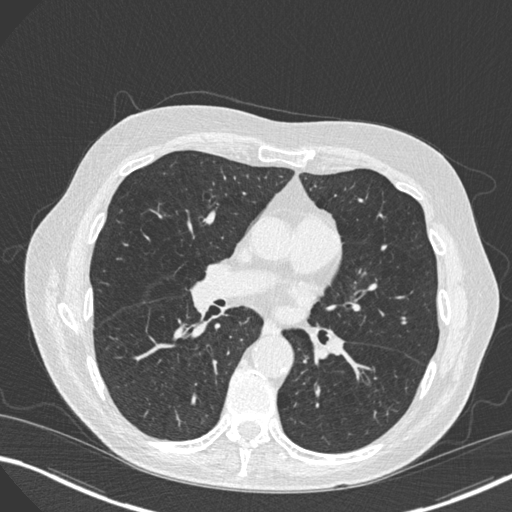
[im 383/711  lung]
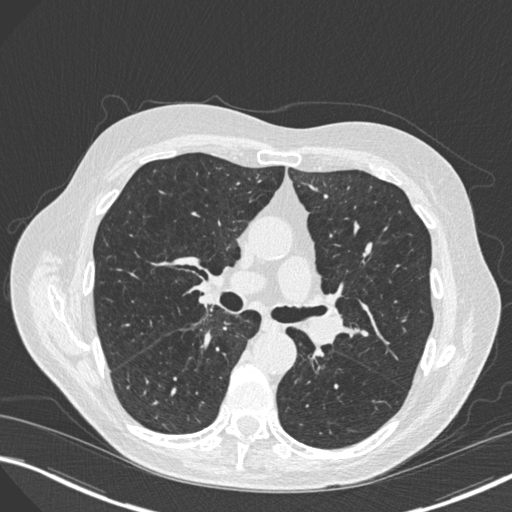
[im 437/711  lung]
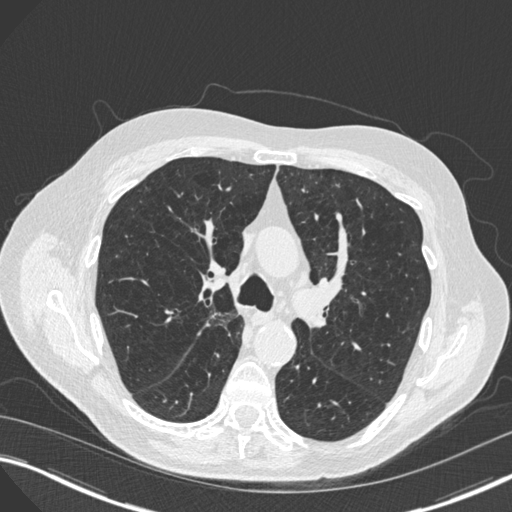
[im 492/711  mediastinal]
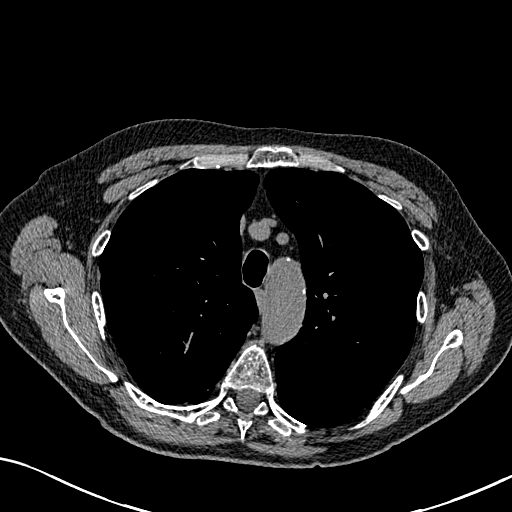
[im 492/711  lung]
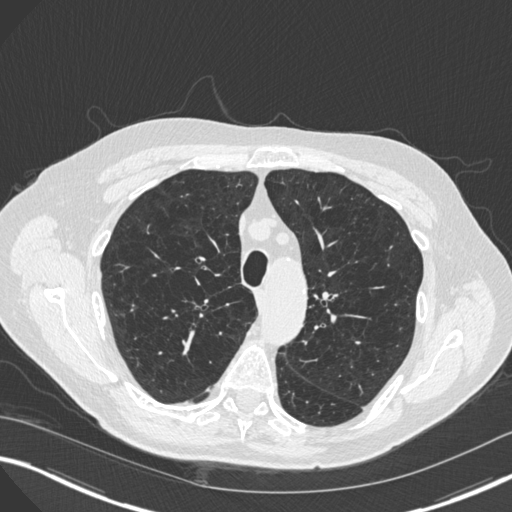
[im 547/711  lung]
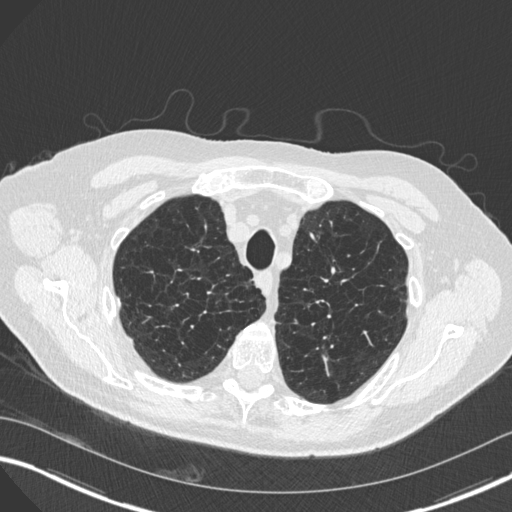
[im 601/711  lung]
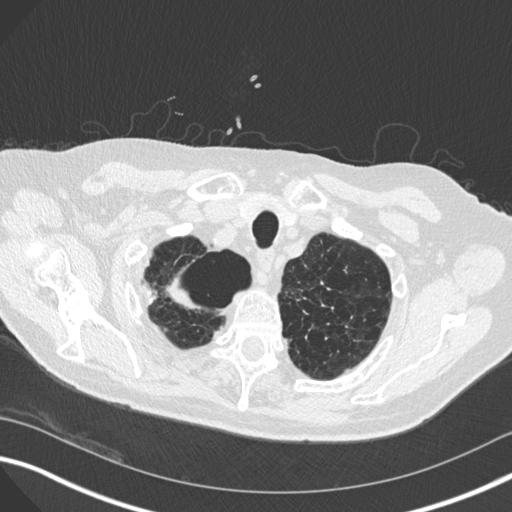
[im 656/711  lung]
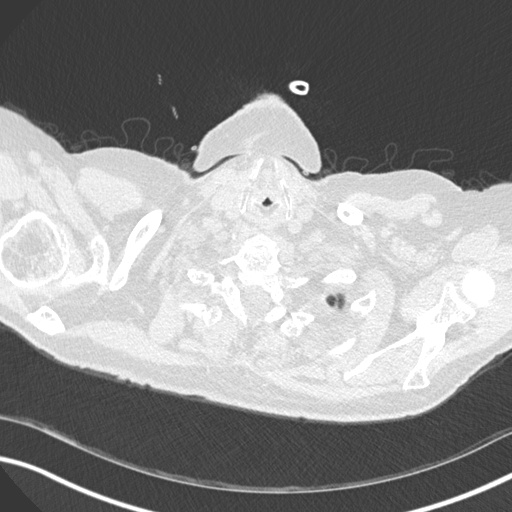

[Series 5: coronal · coronal · 0.71mm/px · 3 of 160 slices shown]
[im 32/160  lung]
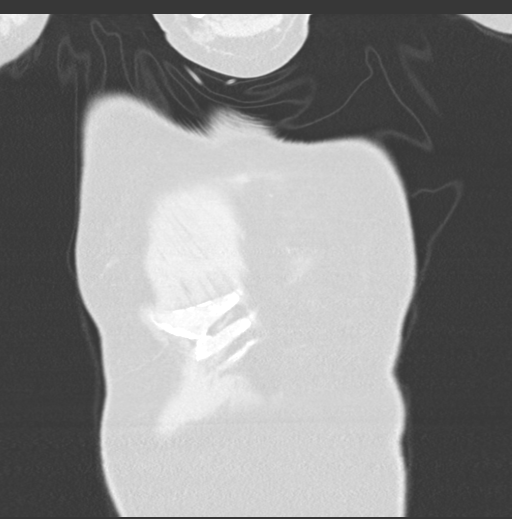
[im 64/160  lung]
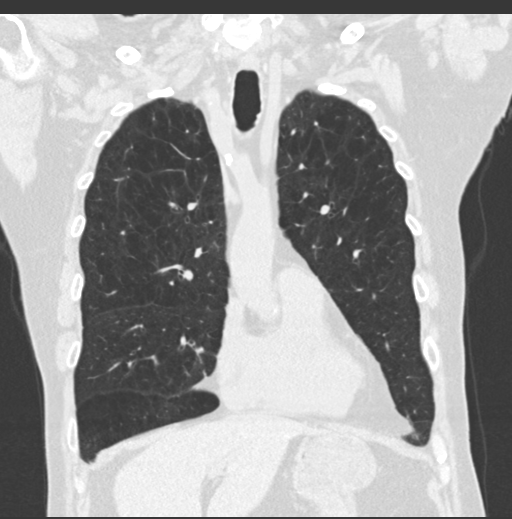
[im 96/160  lung]
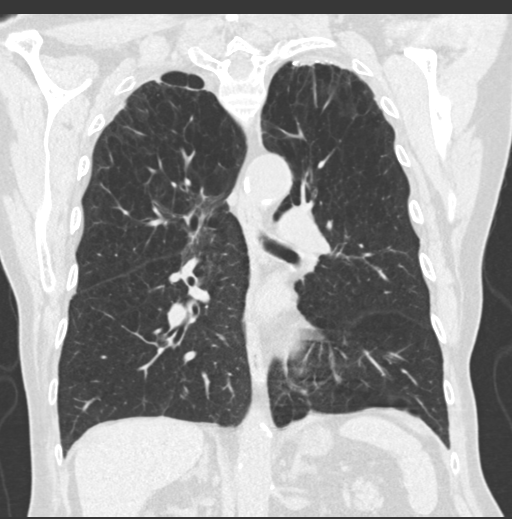

[15 of 36 positions shown; findings below may reference images not displayed]

FINDINGS: Cardiovascular: Aortic atherosclerosis. Normal heart size.
Three-vessel coronary artery calcifications. No pericardial
effusion.

Mediastinum/Nodes: No enlarged mediastinal, hilar, or axillary lymph
nodes. Thyroid gland, trachea, and esophagus demonstrate no
significant findings.

Lungs/Pleura: Severe emphysema. There is a large bulla or
pneumatocele of the right pulmonary apex. There is a spiculated and
increasingly solid-appearing nodule of the right upper lobe which
measures up to 1.0 cm in coronal span and is sequentially enlarged
on prior examinations dating back to [DATE]. No pleural effusion
or pneumothorax.

Upper Abdomen: No acute abnormality.

Musculoskeletal: No chest wall mass or suspicious bone lesions
identified.
IMPRESSION: 1. There is a spiculated and increasingly solid-appearing nodule of
the right upper lobe which measures up to 1.0 cm in coronal span and
is sequentially enlarged on prior examinations dating back to
[DATE]. This is concerning for malignancy and is at the size
threshold for metabolic characterization by PET-CT. This could be
considered for percutaneous CT-guided biopsy however degree of
emphysema is a relative contraindication to biopsy.

2.  Emphysema.

3.  Coronary artery disease and aortic atherosclerosis.

## 2019-02-21 NOTE — Progress Notes (Signed)
Called patient with CT results, no answer/LM to call back. Needs PET scan re: solitary pulmonary nodules >1cm

## 2019-02-25 ENCOUNTER — Other Ambulatory Visit: Payer: Self-pay | Admitting: *Deleted

## 2019-02-25 ENCOUNTER — Telehealth: Payer: Self-pay | Admitting: Primary Care

## 2019-02-25 DIAGNOSIS — R911 Solitary pulmonary nodule: Secondary | ICD-10-CM

## 2019-02-25 NOTE — Telephone Encounter (Signed)
-----   Message from Marshell Garfinkel, MD sent at 02/25/2019  8:29 AM EDT ----- Regarding: RE: CT showed enlarged nodule >1cm Beth- Can you please call to get super D CT made and agree with PET CT  Rob- Can you review the scan and assess for Nav bronch. Higher risk for pneumothorax due to emphysema. ----- Message ----- From: Martyn Ehrich, NP Sent: 02/21/2019   4:40 PM EDT To: Marshell Garfinkel, MD Subject: CT showed enlarged nodule >1cm                 CT chest showed enlarging lung nodules from 25mm to 1cm. Needs PET scan, does not look like hed be a candidate for IR bx d/t degree of emphysema. I tried calling patient but no answer

## 2019-02-25 NOTE — Telephone Encounter (Signed)
Spoke with Judeen Hammans in regards to getting the CT turned into a Super D. Since patient had the CT scan on 8/5 and it was done over at [facility], the imaging has been deleted from their files. Patient will need to have another CT in order to obtain the Super D.

## 2019-02-25 NOTE — Telephone Encounter (Signed)
Can we please get CT chest turned into super D and order PET scan re: nodule >1cm  Called patient again on home and mobile, left message to call back

## 2019-02-25 NOTE — Telephone Encounter (Signed)
See above message about Super D

## 2019-02-26 NOTE — Telephone Encounter (Signed)
PET scan is scheduled so nothing needed until results are back from scan.  Will hold message.

## 2019-02-26 NOTE — Telephone Encounter (Signed)
Noted. We will await PET CT to determine next steps

## 2019-03-04 ENCOUNTER — Other Ambulatory Visit: Payer: Self-pay

## 2019-03-04 ENCOUNTER — Ambulatory Visit (HOSPITAL_COMMUNITY)
Admission: RE | Admit: 2019-03-04 | Discharge: 2019-03-04 | Disposition: A | Discharge status: Home / Self Care | Payer: Medicare Other | Source: Ambulatory Visit | Attending: Primary Care | Admitting: Primary Care

## 2019-03-04 DIAGNOSIS — I509 Heart failure, unspecified: Secondary | ICD-10-CM | POA: Diagnosis not present

## 2019-03-04 DIAGNOSIS — I714 Abdominal aortic aneurysm, without rupture: Secondary | ICD-10-CM | POA: Insufficient documentation

## 2019-03-04 DIAGNOSIS — K5730 Diverticulosis of large intestine without perforation or abscess without bleeding: Secondary | ICD-10-CM | POA: Insufficient documentation

## 2019-03-04 DIAGNOSIS — Z79899 Other long term (current) drug therapy: Secondary | ICD-10-CM | POA: Insufficient documentation

## 2019-03-04 DIAGNOSIS — Z87891 Personal history of nicotine dependence: Secondary | ICD-10-CM | POA: Insufficient documentation

## 2019-03-04 DIAGNOSIS — R911 Solitary pulmonary nodule: Secondary | ICD-10-CM | POA: Diagnosis not present

## 2019-03-04 DIAGNOSIS — I110 Hypertensive heart disease with heart failure: Secondary | ICD-10-CM | POA: Insufficient documentation

## 2019-03-04 DIAGNOSIS — J449 Chronic obstructive pulmonary disease, unspecified: Secondary | ICD-10-CM | POA: Diagnosis not present

## 2019-03-04 DIAGNOSIS — K4090 Unilateral inguinal hernia, without obstruction or gangrene, not specified as recurrent: Secondary | ICD-10-CM | POA: Insufficient documentation

## 2019-03-04 LAB — GLUCOSE, CAPILLARY: Glucose-Capillary: 93 mg/dL (ref 70–99)

## 2019-03-04 IMAGING — CT NUCLEAR MEDICINE PET IMAGE INITIAL (PI) SKULL BASE TO THIGH
1 of 8 series · 3 of 16 positions shown, 4 images · non-contrast
Comparison: Chest CT on [DATE]

CLINICAL DATA: Initial treatment strategy for right upper lobe
pulmonary nodule.

EXAM:
NUCLEAR MEDICINE PET SKULL BASE TO THIGH
TECHNIQUE: 11.2 mCi F-18 FDG was injected intravenously. Full-ring PET imaging
was performed from the skull base to thigh after the radiotracer. CT
data was obtained and used for attenuation correction and anatomic
localization.
Fasting blood glucose: 93 mg/dl

[Series 4: ct sk_thigh 5.0 b31f · axial · 0.98mm/px · z∈[-842,+58]mm · 3 of 226 slices shown, 4 images]
[im 1/226  soft-tissue]
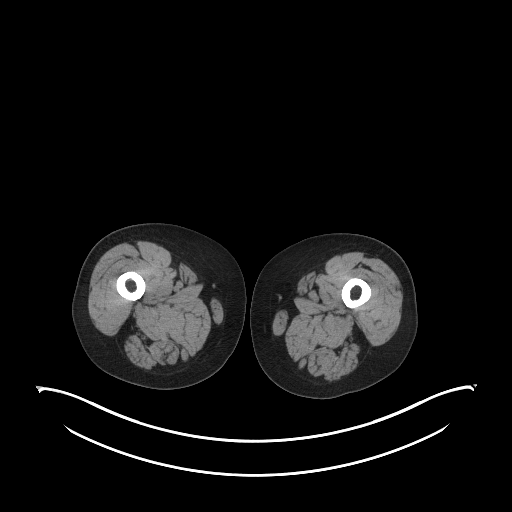
[im 1/226  bone]
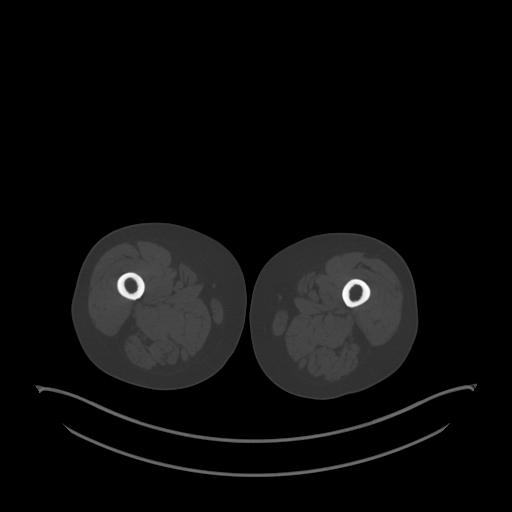
[im 113/226  soft-tissue]
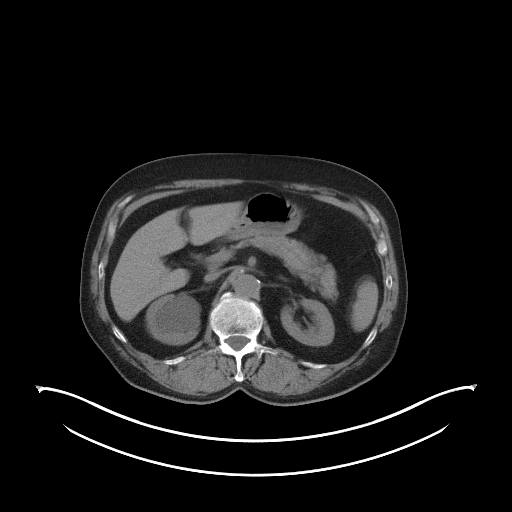
[im 226/226  soft-tissue]
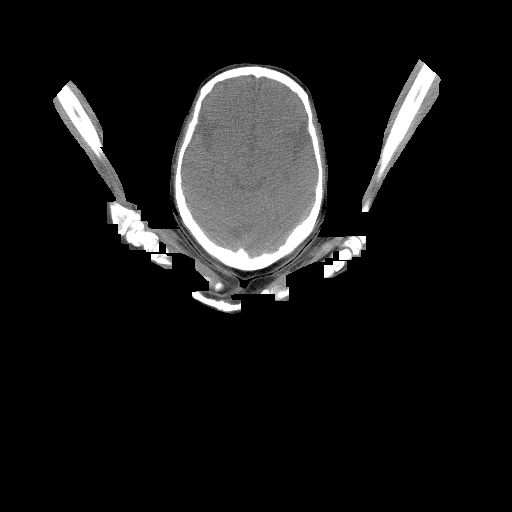

[3 of 16 positions shown; findings below may reference images not displayed]

FINDINGS: Mediastinal blood-pool activity (background): SUV max =

Liver activity (reference): SUV max = N/A

NECK:  No hypermetabolic lymph nodes or masses.

Incidental CT findings:  None.

CHEST: 8 mm spiculated nodule in the right upper lobe shows FDG
uptake with SUV max of 2.1. No other suspicious pulmonary nodules or
masses are identified. Moderate centrilobular emphysema noted.

Mild asymmetric hypermetabolic activity is seen in the right hilum
with SUV max of 4.1. No other hypermetabolic lymph nodes seen within
the thorax.

Incidental CT findings:  Aortic and coronary artery atherosclerosis.

ABDOMEN/PELVIS: No abnormal hypermetabolic activity within the
liver, pancreas, adrenal glands, or spleen. No hypermetabolic lymph
nodes in the abdomen or pelvis.

Incidental CT findings: Aortic atherosclerosis. Saccular infrarenal
abdominal aortic aneurysm measuring 3.3 cm. Right renal cyst.
Diverticulosis is seen mainly involving the descending and sigmoid
colon, however there is no evidence of diverticulitis. Moderate
right inguinal hernia containing small bowel. No evidence of bowel
obstruction or strangulation.

SKELETON: No focal hypermetabolic bone lesions to suggest skeletal
metastasis.

Incidental CT findings:  None.
IMPRESSION: 8 mm spiculated right upper lobe nodule shows FDG uptake, highly
suspicious for primary bronchogenic carcinoma.

Mild asymmetric hypermetabolic activity within the right hilum.

No evidence of extra thoracic metastatic disease.

3.3 cm saccular infrarenal abdominal aortic aneurysm. Recommend
followup by ultrasound in 3 years. This recommendation follows ACR
consensus guidelines: White Paper of the ACR Incidental Findings

Moderate right inguinal hernia containing small bowel.

Colonic diverticulosis, without radiographic evidence of
diverticulitis.

## 2019-03-04 MED ORDER — FLUDEOXYGLUCOSE F - 18 (FDG) INJECTION
8.1800 | Freq: Once | INTRAVENOUS | Status: AC | PRN
  Administered 2019-03-04: 8.18 via INTRAVENOUS

## 2019-03-05 NOTE — Telephone Encounter (Signed)
PET scan today showed 8 mm spiculated right upper lobe nodule shows FDG uptake, highly suspicious for primary bronchogenic carcinoma.  Mild asymmetric hypermetabolic activity within the right hilum.  No evidence of extra thoracic metastatic disease.

## 2019-03-05 NOTE — Telephone Encounter (Signed)
CT has been ordered.

## 2019-03-05 NOTE — Telephone Encounter (Signed)
I called and discussed in detail with patient.  Recommended that he get bronchoscope with navigational biopsy However patient is concerned about the complications of procedure.  He is not sure if he would want chemotherapy radiation even if malignancy is confirmed  Order CT chest without contrast in 3 months and follow-up in clinic to review.

## 2019-03-10 ENCOUNTER — Other Ambulatory Visit: Payer: Self-pay

## 2019-03-10 DIAGNOSIS — I10 Essential (primary) hypertension: Secondary | ICD-10-CM

## 2019-03-10 MED ORDER — OLMESARTAN-AMLODIPINE-HCTZ 20-5-12.5 MG PO TABS: 1.0000 | ORAL_TABLET | Freq: Every day | ORAL | 0 refills | Status: DC

## 2019-05-05 ENCOUNTER — Telehealth: Payer: Self-pay | Admitting: Pulmonary Disease

## 2019-05-05 NOTE — Telephone Encounter (Signed)
I called pt back and let him know that there is no prep

## 2019-06-05 ENCOUNTER — Ambulatory Visit (HOSPITAL_COMMUNITY): Payer: Medicare Other

## 2019-06-06 ENCOUNTER — Other Ambulatory Visit: Payer: Self-pay | Admitting: Family Medicine

## 2019-06-06 DIAGNOSIS — I5032 Chronic diastolic (congestive) heart failure: Secondary | ICD-10-CM

## 2019-06-06 DIAGNOSIS — I10 Essential (primary) hypertension: Secondary | ICD-10-CM

## 2019-07-05 ENCOUNTER — Other Ambulatory Visit: Payer: Self-pay | Admitting: Family Medicine

## 2019-07-05 DIAGNOSIS — I5032 Chronic diastolic (congestive) heart failure: Secondary | ICD-10-CM

## 2019-07-05 DIAGNOSIS — I10 Essential (primary) hypertension: Secondary | ICD-10-CM

## 2019-08-07 ENCOUNTER — Other Ambulatory Visit: Payer: Self-pay | Admitting: Family Medicine

## 2019-08-07 ENCOUNTER — Other Ambulatory Visit: Payer: Self-pay | Admitting: Pulmonary Disease

## 2019-08-07 DIAGNOSIS — I10 Essential (primary) hypertension: Secondary | ICD-10-CM

## 2019-08-07 DIAGNOSIS — E785 Hyperlipidemia, unspecified: Secondary | ICD-10-CM

## 2019-08-12 ENCOUNTER — Other Ambulatory Visit: Payer: Self-pay

## 2019-08-12 ENCOUNTER — Telehealth: Payer: Self-pay | Admitting: Family Medicine

## 2019-08-12 ENCOUNTER — Ambulatory Visit: Payer: Medicare Other | Admitting: Family Medicine

## 2019-08-12 NOTE — Telephone Encounter (Signed)
Patient was scheduled today for a Medicare Wellness Telehealth visit. I called the patient 2 times and left message for him to call back to start apt. Patient did not call. Visit was not performed. AS, CMA

## 2019-08-27 ENCOUNTER — Other Ambulatory Visit: Payer: Self-pay | Admitting: Family Medicine

## 2019-08-27 DIAGNOSIS — E785 Hyperlipidemia, unspecified: Secondary | ICD-10-CM

## 2019-08-27 DIAGNOSIS — I10 Essential (primary) hypertension: Secondary | ICD-10-CM

## 2019-08-27 DIAGNOSIS — I5032 Chronic diastolic (congestive) heart failure: Secondary | ICD-10-CM

## 2019-08-28 ENCOUNTER — Other Ambulatory Visit: Payer: Self-pay | Admitting: Family Medicine

## 2019-08-28 DIAGNOSIS — I10 Essential (primary) hypertension: Secondary | ICD-10-CM

## 2019-08-28 DIAGNOSIS — I5032 Chronic diastolic (congestive) heart failure: Secondary | ICD-10-CM

## 2019-08-29 ENCOUNTER — Other Ambulatory Visit: Payer: Self-pay | Admitting: Family Medicine

## 2019-08-29 DIAGNOSIS — I10 Essential (primary) hypertension: Secondary | ICD-10-CM

## 2019-08-29 DIAGNOSIS — I5032 Chronic diastolic (congestive) heart failure: Secondary | ICD-10-CM

## 2019-09-04 ENCOUNTER — Other Ambulatory Visit: Payer: Self-pay

## 2019-09-04 ENCOUNTER — Encounter: Payer: Self-pay | Admitting: Family Medicine

## 2019-09-04 ENCOUNTER — Ambulatory Visit (INDEPENDENT_AMBULATORY_CARE_PROVIDER_SITE_OTHER): Payer: Medicare Other | Admitting: Family Medicine

## 2019-09-04 VITALS — BP 129/77 | Ht 72.0 in | Wt 173.6 lb

## 2019-09-04 DIAGNOSIS — Z91199 Patient's noncompliance with other medical treatment and regimen due to unspecified reason: Secondary | ICD-10-CM

## 2019-09-04 DIAGNOSIS — J449 Chronic obstructive pulmonary disease, unspecified: Secondary | ICD-10-CM | POA: Diagnosis not present

## 2019-09-04 DIAGNOSIS — R911 Solitary pulmonary nodule: Secondary | ICD-10-CM

## 2019-09-04 DIAGNOSIS — Z9119 Patient's noncompliance with other medical treatment and regimen: Secondary | ICD-10-CM | POA: Diagnosis not present

## 2019-09-04 DIAGNOSIS — I10 Essential (primary) hypertension: Secondary | ICD-10-CM | POA: Diagnosis not present

## 2019-09-04 DIAGNOSIS — I5032 Chronic diastolic (congestive) heart failure: Secondary | ICD-10-CM | POA: Diagnosis not present

## 2019-09-04 DIAGNOSIS — E785 Hyperlipidemia, unspecified: Secondary | ICD-10-CM | POA: Diagnosis not present

## 2019-09-04 DIAGNOSIS — Z87898 Personal history of other specified conditions: Secondary | ICD-10-CM | POA: Diagnosis not present

## 2019-09-04 MED ORDER — OLMESARTAN-AMLODIPINE-HCTZ 20-5-12.5 MG PO TABS: ORAL_TABLET | ORAL | 0 refills | Status: DC

## 2019-09-04 MED ORDER — FUROSEMIDE 20 MG PO TABS: 10.0000 mg | ORAL_TABLET | Freq: Every day | ORAL | 0 refills | Status: DC

## 2019-09-04 MED ORDER — ATORVASTATIN CALCIUM 80 MG PO TABS: 80.0000 mg | ORAL_TABLET | Freq: Every day | ORAL | 0 refills | Status: DC

## 2019-09-04 MED ORDER — METOPROLOL SUCCINATE ER 50 MG PO TB24: 50.0000 mg | ORAL_TABLET | Freq: Every day | ORAL | 0 refills | Status: DC

## 2019-09-04 NOTE — Progress Notes (Signed)
Telehealth office visit note for [name], D.O- at Primary Care at [facility]   I connected with current patient today and verified that I am speaking with the correct person using two identifiers.   . Location of the patient: Home . Location of the provider: Office Only the patient (+/- their family members at pt's discretion) and myself were participating in the encounter - This visit type was conducted due to national recommendations for restrictions regarding the COVID-19 Pandemic (e.g. social distancing) in an effort to limit this patient's exposure and mitigate transmission in our community.  This format is felt to be most appropriate for this patient at this time.   - No physical exam could be performed with this format, beyond that communicated to [place] by the patient/ family members as noted.   - Additionally my office staff/ schedulers discussed with the patient that there may be a monetary charge related to this service, depending on their medical insurance.   The patient expressed understanding, and agreed to proceed.       History of Present Illness: No chief complaint on file.    I, [name], am serving as Education administrator for [employer].   Says he's been doing pretty good.  Per patient, no concerns today.   - Chronic Diastolic Heart Failure He continues taking Lasix and tolerating well. Denies concerns with excessive urination on management.  Continues to sleep using two pillows at night.    Denies increased trouble lying flat at night, increased trouble breathing at night.  Denies increased swelling of the legs.  Denies increased work of breathing or shortness of breath while engaging in activities such as walking from room to room.  "Ain't no worse, ain't no better."   - COPD  - right upper lobe lung nodule highly suspicious for malignancy - monitored by Pulmonology Believes his breathing has been fine.  He continues to follow up with pulmonology  for monitoring, with his last visit in August of 2020.  Regarding his recent PET imaging, states he was told it had "grown just a little, but [the doctor] didn't seem too concerned."  Per patient, did not repeat his imaging as advised recently because "the hospitals were full with the virus."  Says "my brother had the same thing and died a normal death."   - History of Prediabetes When asked if he's been trying to stay away from eating sweets, bread, rice, says "yes."   HPI:  Hypertension:  -  His blood pressure at home has been running: 159/88 before taking his medications this morning, and after he took his meds, it was 127/86.  - Patient reports good compliance with medication and/or lifestyle modification  - His denies acute concerns or problems related to treatment plan  - He denies new onset of: chest pain, exercise intolerance, shortness of breath, dizziness, visual changes, headache, lower extremity swelling or claudication.   Last 3 blood pressure readings in our office are as follows: BP Readings from Last 3 Encounters:  09/04/19 129/77  12/23/18 138/85  11/14/18 (!) 146/82   Filed Weights   09/04/19 0954  Weight: 173 lb 9.6 oz (78.7 kg)    HPI:  Hyperlipidemia:  74 y.o. male here for cholesterol follow-up.   - Patient reports good compliance with treatment plan of:  medication and/ or lifestyle management.    - Patient denies any acute concerns or problems with management plan   - He denies new onset of:  myalgias, arthralgias, increased fatigue more than normal, chest pains, exercise intolerance, shortness of breath, dizziness, visual changes, headache, lower extremity swelling or claudication.   Most recent cholesterol panel was:  Lab Results  Component Value Date   CHOL 118 07/03/2018   HDL 40 07/03/2018   LDLCALC 61 07/03/2018   TRIG 83 07/03/2018   CHOLHDL 3.0 07/03/2018   Hepatic Function Latest Ref Rng & Units 03/12/2018 12/11/2017 09/18/2017  Total  Protein 6.0 - 8.5 g/dL 6.4 6.8 6.0(L)  Albumin 3.5 - 4.8 g/dL 4.1 4.5 3.0(L)  AST 0 - 40 IU/L 20 22 53(H)  ALT 0 - 44 IU/L 29 17 48  Alk Phosphatase 39 - 117 IU/L 123(H) 112 57  Total Bilirubin 0.0 - 1.2 mg/dL 1.1 0.2 0.9  Bilirubin, Direct 0.1 - 0.5 mg/dL - - 0.2      GAD 7 : Generalized Anxiety Score 12/23/2018  Nervous, Anxious, on Edge 0  Control/stop worrying 0  Worry too much - different things 0  Trouble relaxing 0  Restless 0  Easily annoyed or irritable 0  Afraid - awful might happen 0  Total GAD 7 Score 0  Anxiety Difficulty Not difficult at all    Depression screen [facility] 2/9 09/04/2019 12/23/2018 08/22/2018 08/08/2018 04/30/2018  Decreased Interest 0 0 0 0 0  Down, Depressed, Hopeless 0 0 0 0 0  PHQ - 2 Score 0 0 0 0 0  Altered sleeping 1 0 0 0 0  Tired, decreased energy 1 0 1 3 0  Change in appetite 0 0 0 0 0  Feeling bad or failure about yourself  0 0 0 0 0  Trouble concentrating 0 0 0 0 0  Moving slowly or fidgety/restless 0 0 0 0 0  Suicidal thoughts 0 0 0 0 0  PHQ-9 Score 2 0 1 3 0  Difficult doing work/chores Not difficult at all Not difficult at all Not difficult at all Not difficult at all Not difficult at all  Some recent data might be hidden      Impression and Recommendations:    1. Chronic diastolic heart failure ([place])   2. COPD, group D, by GOLD 2017 classification ([place])   3. Essential hypertension- poorly controlled   4. Hyperlipidemia, unspecified hyperlipidemia type   5. History of prediabetes-  A1c 6.1 in December 2018   6. Solitary pulmonary nodule- R upper lobe- f/up pulm- highly suspicous for CA   7. H/O noncompliance with medical treatment, presenting hazards to health      - Last seen December 23, 2018.   COPD, group D, by GOLD 2017 classification -  Followed by Pulm Highly suspicious for malignancy solitary Pulmonary Nodule, R Upper Lobe which has grown from prior imaging - Per patient, breathing is stable at home.  - Monitored by Dr.  Vaughan Browner of Pulmonology, last seen 02/25/2019.  - I discussed with pt that PET scan was performed 8 of 2020, with findings including "8 mm spiculated right upper lobe nodule shows FDG uptake", and I told him reading said it looked "highly suspicious for primary bronchogenic carcinoma."  - Reviewed with patient today that he was advised to return for repeat imaging, but was lost to follow-up.  - STRONGLY encouraged patient to call his pulmonologist and obtain follow-up imaging as recommended.  - Discussed that patient's findings could be due to cancer.  Discussed critical need to obtain follow-up earlier rather than later for better health outcomes and reduced risk of poor outcomes, including death.   -  Patient denies that mood disorder such as anxiety or depression is playing a role at all in him not following up.  He states that he just did not want to go due to "Covid being in all the hospitals "  - Extensive education provided along with emphasis on imaging as soon as possible.  - Patient agrees to call pulmonology in the very near future as I  advised.  - Will continue to monitor alongside specialist.   Essential Hypertension - Blood pressure currently is stable, at goal. - Discussed need to continue to maintain BP below 140/90.  - Patient will continue current treatment regimen.  See med list.  - Counseled patient on pathophysiology of disease and discussed various treatment options, which always includes dietary and lifestyle modification as first line.   - Lifestyle changes such as dash and heart healthy diets and engaging in a regular exercise program discussed extensively with patient.   - Reviewed that with the patient's CHF and COPD, he needs to give himself ample time to relax after moving around, prior to checking his blood pressure.  - Ambulatory blood pressure monitoring encouraged at least 3 times weekly.  Keep log and bring in every office visit.  Reminded patient that if  they ever feel poorly in any way, to check their blood pressure and pulse.  - We will continue to monitor.   Congestive Diastolic Heart Failure - Reviewed patient's treatment plan during appointment today. - Per patient, tolerating all meds including lasix.  - Discussed need for echocardiogram of heart to assess the health of patient's heart and cardiac function.  - recommended following up with Cardiology near future, as his last visit appears to have been in 2018.  - Patient declined [place] scheduling an outpt echocardiogram today. And declines going to Cardiology as recommended  - Per patient, wants to wait until symptoms change before obtaining further follow-up.  Agrees to call and let us know if he experiences symptoms of concern, and go for echocardiogram as advised.  - Reviewed red flag symptoms of heart failure with patient today.  Extensive discussion held and counseling provided.  - Will continue to monitor and provide additional care and assessment as needed.   Hyperlipidemia - Need for repeat lipid panel near future.  - Pt will continue current treatment regimen.  See med list.  - We will continue to monitor and re-check as discussed.   Recommendations - Return for Medicare Wellness March 1 with fasting blood work that morning.  - As part of my medical decision making, I reviewed the following data within the [place] History obtained from pt /family, CMA notes reviewed and incorporated if applicable, Labs reviewed, Radiograph/ tests reviewed if applicable and OV notes from prior OV's with me, as well as other specialists she/he has seen since seeing me last, were all reviewed and used in my medical decision making process today.    - Additionally, discussion had with patient regarding our treatment plan, and their biases/concerns about that plan were used in my medical decision making today.    - The patient agreed with the plan and demonstrated an  understanding of the instructions.   No barriers to understanding were identified.    - Red flag symptoms and signs discussed in detail.  Patient expressed understanding regarding what to do in case of emergency\ urgent symptoms.   - The patient was advised to call back or seek an in-person evaluation if the symptoms worsen or if  the condition fails to improve as anticipated.   Return for f/up March 1 as scheduled for Medicare Wellness and full fasting blood work.    Meds ordered this encounter  Medications  . atorvastatin (LIPITOR) 80 MG tablet    Sig: Take 1 tablet (80 mg total) by mouth daily at 6 PM.    Dispense:  90 tablet    Refill:  0  . furosemide (LASIX) 20 MG tablet    Sig: Take 0.5 tablets (10 mg total) by mouth daily.    Dispense:  45 tablet    Refill:  0  . metoprolol succinate (TOPROL-XL) 50 MG 24 hr tablet    Sig: Take 1 tablet (50 mg total) by mouth daily.    Dispense:  90 tablet    Refill:  0  . Olmesartan-amLODIPine-HCTZ 20-5-12.5 MG TABS    Sig: TAKE 1 TABLET BY MOUTH DAILY.    Dispense:  90 tablet    Refill:  0    Medications Discontinued During This Encounter  Medication Reason  . furosemide (LASIX) 20 MG tablet Reorder  . Olmesartan-amLODIPine-HCTZ 20-5-12.5 MG TABS Reorder  . atorvastatin (LIPITOR) 80 MG tablet Reorder  . metoprolol succinate (TOPROL-XL) 50 MG 24 hr tablet Reorder     I provided 14+ minutes of non face-to-face time during this encounter.  Additional time was spent with charting and coordination of care before and after the actual visit commenced.   Note:  This note was prepared with assistance of Dragon voice recognition software. Occasional wrong-word or sound-a-like substitutions may have occurred due to the inherent limitations of voice recognition software.   This document serves as a record of services personally performed by [name], DO. It was created on her behalf by [name], a trained medical scribe. The  creation of this record is based on the scribe's personal observations and the provider's statements to them.   This case required medical decision making of at least moderate complexity. The above documentation from [name], medical scribe, has been reviewed by Marjory Sneddon, D.O.       Patient Care Team    Relationship Specialty Notifications Start End  [name], DO PCP - General Family Medicine  10/22/17   Marshell Garfinkel, MD Consulting Physician Pulmonary Disease  11/06/17      -Vitals obtained; medications/ allergies reconciled;  personal medical, social, Sx etc.histories were updated by CMA, reviewed by me and are reflected in chart   Patient Active Problem List   Diagnosis Date Noted  . History of prediabetes-  A1c 6.1 in December 2018 12/23/2018  . Hyperlipidemia 12/13/2017  . Hypertension 11/06/2017  . Stopped smoking 2008 with greater than 40 pack year history 11/06/2017  . Heart failure ([place]) 07/14/2017  . Solitary pulmonary nodule- R middle lobe- followed by pulm 08/20/2018  . Dyspnea on minimal exertion-likely due to COPD 11/06/2017  . Chronic respiratory failure with hypoxia ([place]) 09/17/2017  . COPD, group D, by GOLD 2017 classification ([place]) 07/14/2017  . Vitamin D insufficiency 12/13/2017  . H/O noncompliance with medical treatment, presenting hazards to health 09/04/2019  . Accelerated hypertension 08/08/2018  . Physical deconditioning 11/15/2017  . Chronic fatigue 11/06/2017  . CAP (community acquired pneumonia) 07/14/2017     Current Meds  Medication Sig  . albuterol (PROVENTIL HFA;VENTOLIN HFA) 108 (90 Base) MCG/ACT inhaler Inhale 2 puffs into the lungs every 4 (four) hours as needed for wheezing or shortness of breath.  Marland Kitchen albuterol (PROVENTIL) (2.5 MG/3ML) 0.083% nebulizer solution  Take 3 mLs (2.5 mg total) by nebulization 3 (three) times daily.  Marland Kitchen atorvastatin (LIPITOR) 80 MG tablet Take 1 tablet (80 mg total) by mouth daily at 6  PM.  . Black Elderberry 50 MG/5ML SYRP Take 10 mLs by mouth daily.  . Cholecalciferol (VITAMIN D3) 5000 units TABS 5,000 IU OTC vitamin D3 daily.  . furosemide (LASIX) 20 MG tablet Take 0.5 tablets (10 mg total) by mouth daily.  . metoprolol succinate (TOPROL-XL) 50 MG 24 hr tablet Take 1 tablet (50 mg total) by mouth daily.  . Olmesartan-amLODIPine-HCTZ 20-5-12.5 MG TABS TAKE 1 TABLET BY MOUTH DAILY.  . TRELEGY ELLIPTA 100-62.5-25 MCG/INH AEPB INHALE 1 PUFF INTO THE LUNGS DAILY.  . [DISCONTINUED] atorvastatin (LIPITOR) 80 MG tablet TAKE 1 TABLET BY MOUTH ONCE DAILY BEFORE BED**PATIENT NEEDS APT FOR FURTHER REFILLS**  . [DISCONTINUED] furosemide (LASIX) 20 MG tablet Take 0.5 tablets (10 mg total) by mouth daily. OFFICE VISIT REQUIRED PRIOR TO ANY FURTHER REFILLS  . [DISCONTINUED] metoprolol succinate (TOPROL-XL) 50 MG 24 hr tablet Take 1 tablet (50 mg total) by mouth daily. **PATIENT NEEDS APT FOR FURTHER REFILLS**  . [DISCONTINUED] Olmesartan-amLODIPine-HCTZ 20-5-12.5 MG TABS TAKE 1 TABLET BY MOUTH DAILY. **NEED APPOINTMENT FOR FURTHER REFILLS**     Allergies:  No Known Allergies   ROS:  See above HPI for pertinent positives and negatives   Objective:   Blood pressure 129/77, height 6' (1.829 m), weight 173 lb 9.6 oz (78.7 kg).  (if some vitals are omitted, this means that patient was UNABLE to obtain them even though they were asked to get them prior to OV today.  They were asked to call us at their earliest convenience with these once obtained. )  General: A & O * 3; sounds in no acute distress; in usual state of health.  Skin: Pt confirms warm and dry extremities and pink fingertips HEENT: Pt confirms lips non-cyanotic Chest: Patient confirms normal chest excursion and movement Respiratory: speaking in full sentences, no conversational dyspnea; patient confirms no use of accessory muscles Psych: insight appears good, mood- appears full

## 2019-09-15 ENCOUNTER — Other Ambulatory Visit: Payer: Self-pay

## 2019-09-15 ENCOUNTER — Ambulatory Visit (INDEPENDENT_AMBULATORY_CARE_PROVIDER_SITE_OTHER): Payer: Medicare Other | Admitting: Family Medicine

## 2019-09-15 ENCOUNTER — Encounter: Payer: Self-pay | Admitting: Family Medicine

## 2019-09-15 VITALS — BP 127/86 | Temp 98.7°F | Ht 72.0 in | Wt 171.8 lb

## 2019-09-15 DIAGNOSIS — Z0000 Encounter for general adult medical examination without abnormal findings: Secondary | ICD-10-CM

## 2019-09-15 DIAGNOSIS — I714 Abdominal aortic aneurysm, without rupture, unspecified: Secondary | ICD-10-CM | POA: Insufficient documentation

## 2019-09-15 DIAGNOSIS — R911 Solitary pulmonary nodule: Secondary | ICD-10-CM | POA: Diagnosis not present

## 2019-09-15 NOTE — Progress Notes (Signed)
Subjective:   George Frazier is a 74 y.o. male who presents for Medicare Annual/Subsequent preventive examination.   HPI: Says he has no major hearing difficulties. Notes "I can't hear as good as I used to." Doesn't think he tunes others out. He's considered getting a hearing aid, but has not done it yet. Patient denies memory concerns. He does not engage in exercise. He has not established with any new doctors Patient quit smoking around age 35 or 8, 2008. He smoked about a pack per day for forty years. Patient is aware that a pulmonary nodule was visualized in August, but has not obtained further follow-up (PET scan, CT scan) as advised. Reiterates "my brother had the same thing and he didn't have cancer, he didn't die of cancer." Per patient, he is not afraid of having cancer. Indicates that, given his current poor lung health, he is more afraid of having treatment and then living with a further diminished quality of life. States "It might give me a few more years, but what kind of life would it be?"     Objective:    Vitals: BP 127/86   Temp 98.7 F (37.1 C)   Ht 6' (1.829 m)   Wt 171 lb 12.8 oz (77.9 kg)   BMI 23.30 kg/m   Body mass index is 23.3 kg/m.  Advanced Directives 08/08/2018 08/08/2018 11/06/2017 09/18/2017 07/27/2017 07/14/2017  Does Patient Have a Medical Advance Directive? _0  No  Would patient like information on creating a medical advance directive? No - Patient declined No - Patient declined No - Patient declined No - Patient declined No - Patient declined No - Patient declined    Tobacco Social History   Tobacco Use  Smoking Status Former Smoker  . Packs/day: 1.00  . Years: 40.00  . Pack years: 40.00  . Quit date: 2008  . Years since quitting: 13.1  Smokeless Tobacco Former Engineer, structural given: Not Answered     Past Medical History:  Diagnosis Date  . CHF (congestive heart failure) ([place])   . COPD (chronic obstructive pulmonary disease)  ([place])   . Hypertension    History reviewed. No pertinent surgical history. Family History  Problem Relation Age of Onset  . CAD Mother 50  . Hypertension Mother   . Stroke Mother   . COPD Father 65  . AAA (abdominal aortic aneurysm) Father   . COPD Brother   . COPD Brother   . Cancer Neg Hx    Social History   Socioeconomic History  . Marital status: Single    Spouse name: Not on file  . Number of children: Not on file  . Years of education: Not on file  . Highest education level: Not on file  Occupational History  . Occupation: retired  Tobacco Use  . Smoking status: Former Smoker    Packs/day: 1.00    Years: 40.00    Pack years: 40.00    Quit date: 2008    Years since quitting: 13.1  . Smokeless tobacco: Former Network engineer and Sexual Activity  . Alcohol use: No  . Drug use: No  . Sexual activity: Never  Other Topics Concern  . Not on file  Social History Narrative  . Not on file   Social Determinants of Health   Financial Resource Strain:   . Difficulty of Paying Living Expenses: Not on file  Food Insecurity:   . Worried About Charity fundraiser  in the Last Year: Not on file  . Ran Out of Food in the Last Year: Not on file  Transportation Needs:   . Lack of Transportation (Medical): Not on file  . Lack of Transportation (Non-Medical): Not on file  Physical Activity:   . Days of Exercise per Week: Not on file  . Minutes of Exercise per Session: Not on file  Stress:   . Feeling of Stress : Not on file  Social Connections:   . Frequency of Communication with Friends and Family: Not on file  . Frequency of Social Gatherings with Friends and Family: Not on file  . Attends Religious Services: Not on file  . Active Member of Clubs or Organizations: Not on file  . Attends Archivist Meetings: Not on file  . Marital Status: Not on file    Outpatient Encounter Medications as of 09/15/2019  Medication Sig  . albuterol (PROVENTIL HFA;VENTOLIN  HFA) 108 (90 Base) MCG/ACT inhaler Inhale 2 puffs into the lungs every 4 (four) hours as needed for wheezing or shortness of breath.  Marland Kitchen albuterol (PROVENTIL) (2.5 MG/3ML) 0.083% nebulizer solution Take 3 mLs (2.5 mg total) by nebulization 3 (three) times daily.  Marland Kitchen atorvastatin (LIPITOR) 80 MG tablet Take 1 tablet (80 mg total) by mouth daily at 6 PM.  . Cholecalciferol (VITAMIN D3) 5000 units TABS 5,000 IU OTC vitamin D3 daily.  . furosemide (LASIX) 20 MG tablet Take 0.5 tablets (10 mg total) by mouth daily.  . metoprolol succinate (TOPROL-XL) 50 MG 24 hr tablet Take 1 tablet (50 mg total) by mouth daily.  . Olmesartan-amLODIPine-HCTZ 20-5-12.5 MG TABS TAKE 1 TABLET BY MOUTH DAILY.  . TRELEGY ELLIPTA 100-62.5-25 MCG/INH AEPB INHALE 1 PUFF INTO THE LUNGS DAILY.  Marland Kitchen Black Elderberry 50 MG/5ML SYRP Take 10 mLs by mouth daily.   No facility-administered encounter medications on file as of 09/15/2019.    Activities of Daily Living In your present state of health, do you have any difficulty performing the following activities: 09/15/2019 09/04/2019  Hearing? N N  Vision? N Y  Difficulty concentrating or making decisions? N N  Walking or climbing stairs? N N  Dressing or bathing? N N  Doing errands, shopping? N N  Some recent data might be hidden    Patient Care Team: [name], DO as PCP - General (Family Medicine) Marshell Garfinkel, MD as Consulting Physician (Pulmonary Disease)   Assessment:   This is a routine wellness examination for George Frazier.  Exercise Activities and Dietary recommendations    Goals   None     Fall Risk Fall Risk  09/15/2019 09/04/2019 08/08/2018 11/06/2017  Falls in the past year? 0 0 0 No  Number falls in past yr: 0 0 - -  Injury with Fall? 0 0 - -  Follow up Falls evaluation completed Falls evaluation completed Falls evaluation completed -   Is the patient's home free of loose throw rugs in walkways, pet beds, electrical cords, etc?   yes      Grab bars in the  bathroom? no      Handrails on the stairs?   yes      Adequate lighting?   yes  Timed Get Up and Go Performed: Telehealth  Depression Screen PHQ 2/9 Scores 09/15/2019 09/04/2019 12/23/2018 08/22/2018  PHQ - 2 Score 0 0 0 0  PHQ- 9 Score 3 2 0 1    Cognitive Function 6CIT Screen 09/15/2019  What Year? 0 points  What month? 0  points  What time? 0 points  Count back from 20 0 points  Months in reverse 0 points  Repeat phrase 0 points  Total Score 0    Immunization History  Administered Date(s) Administered  . Influenza, High Dose Seasonal PF 04/16/2018  . Pneumococcal Polysaccharide-23 04/16/2018    Qualifies for Shingles Vaccine? Patient declined Influenza: Patient declined Pneumococcal: Patient declined TDAP: Patient declined  Screening Tests Health Maintenance  Topic Date Due  . INFLUENZA VACCINE  02/15/2019  . PNA vac Low Risk Adult (2 of 2 - PCV13) 04/17/2019  . COLONOSCOPY  12/23/2019 (Originally 11/21/1995)  . TETANUS/TDAP  12/23/2019 (Originally 11[birth year]20/1964)  . Hepatitis C Screening  12/23/2019 (Originally 20-Sep-1945)    Cancer Screenings: Lung: Low Dose CT Chest recommended if Age 53-80 years, 30 pack-year currently smoking OR have quit w/in 15years. Patient does qualify. Patient declined  Colorectal: Patient declined   Additional Screenings: Hepatitis C Screening: Patient declined HIV Screening: Done 07/14/2017      Plan:     Medicare Wellness 1:45 PM PLAN: - Reviewed recommendations for screenings and immunizations according to health guidelines. - Patient has never had a colonoscopy in the past, and declines again today. - Advised patient of the option of the ColoGuard kit for screening. - Extensive education and counseling provided to patient today. Patient understands risks and continues to decline. - TDAP not up to date, need for TDAP. Patient declines. - Patient understands risks of not updating his vaccination and continues to decline. - Need for shingles  vaccine. Patient declines. - Need for pneumonia vaccine. Patient declines. Per patient, "I don't feel that I need them." - Patient has had an influenza vaccination in the past. Patient declines today. Per patient, "I don't think that it ever helped me." - Former smoker, over 40 pack-year history. Last CT chest obtained 02/19/2019. Advised patient of need for follow-up with pulmonology regarding the 7m right upper lobe pulmonary nodule found during last screening. Discussed need for patient to obtain CT scan and PET scan as recommended, and further imaging as advised. Strongly encouraged patient to call pulmonology ASAP to set up this follow-up. - 3.3 cm saccular infrarenal abdominal aortic aneurysm visualized on PET scan 03/05/2019. Recommend followup by ultrasound in 3 years. - Advised patient to obtain hearing aid as advised through avenues recommended, such as [employer]. - Encouraged patient to engage in prudent health habits, such as eating a lower carb, higher protein, high antioxidant diet, and engaging in regular exercise as tolerated, ideally 150-300 minutes of cardiovascular activity per week as advised by the AHA. - Extensive education provided to the patient and all questions answered.    I have personally reviewed and noted the following in the patient's chart:   . Medical and social history . Use of alcohol, tobacco or illicit drugs  . Current medications and supplements . Functional ability and status . Nutritional status . Physical activity . Advanced directives . List of other physicians . Hospitalizations, surgeries, and ER visits in previous 12 months . Vitals . Screenings to include cognitive, depression, and falls . Referrals and appointments  In addition, I have reviewed and discussed with patient certain preventive protocols, quality metrics, and best practice recommendations. A written personalized care plan for preventive services as well as general preventive health  recommendations were provided to patient.     DMellody Dance DO  09/15/2019

## 2019-12-08 ENCOUNTER — Other Ambulatory Visit: Payer: Self-pay | Admitting: Family Medicine

## 2019-12-08 ENCOUNTER — Other Ambulatory Visit: Payer: Self-pay | Admitting: Pulmonary Disease

## 2019-12-08 DIAGNOSIS — I5032 Chronic diastolic (congestive) heart failure: Secondary | ICD-10-CM

## 2019-12-08 DIAGNOSIS — I10 Essential (primary) hypertension: Secondary | ICD-10-CM

## 2019-12-09 ENCOUNTER — Other Ambulatory Visit: Payer: Self-pay | Admitting: *Deleted

## 2019-12-16 ENCOUNTER — Telehealth: Payer: Self-pay | Admitting: Physician Assistant

## 2019-12-16 DIAGNOSIS — E785 Hyperlipidemia, unspecified: Secondary | ICD-10-CM

## 2019-12-16 DIAGNOSIS — I5032 Chronic diastolic (congestive) heart failure: Secondary | ICD-10-CM

## 2019-12-16 DIAGNOSIS — I10 Essential (primary) hypertension: Secondary | ICD-10-CM

## 2019-12-16 MED ORDER — ATORVASTATIN CALCIUM 80 MG PO TABS
80.0000 mg | ORAL_TABLET | Freq: Every day | ORAL | 0 refills | Status: AC
Start: 2019-12-16 — End: ?

## 2019-12-16 MED ORDER — FUROSEMIDE 20 MG PO TABS: 10.0000 mg | ORAL_TABLET | Freq: Every day | ORAL | 0 refills | Status: DC

## 2019-12-16 MED ORDER — METOPROLOL SUCCINATE ER 50 MG PO TB24: 50.0000 mg | ORAL_TABLET | Freq: Every day | ORAL | 0 refills | Status: AC

## 2019-12-16 MED ORDER — OLMESARTAN-AMLODIPINE-HCTZ 20-5-12.5 MG PO TABS: ORAL_TABLET | ORAL | 0 refills | Status: DC

## 2019-12-16 NOTE — Telephone Encounter (Signed)
Patient called states pharmacy says provider rejected Rx refill request ( advised his they probably sent to Dr Raliegh Scarlet who is technically not his PCP anymore so cannot refill his Rxs)   --Forwarding refill request to med asst for :   1)--- atorvastatin (LIPITOR) 80 MG tablet [[id number]]   Order Details Dose: 80 mg Route: Oral Frequency: Daily-1800  Dispense Quantity: 90 tablet Refills: 0       Sig: Take 1 tablet (80 mg total) by mouth daily at 6 PM.       2)--- furosemide (LASIX) 20 MG tablet [[id number]]   Order Details Dose: 10 mg Route: Oral Frequency: Daily  Dispense Quantity: 45 tablet Refills: 0       Sig: Take 0.5 tablets (10 mg total) by mouth daily.       3)--- metoprolol succinate (TOPROL-XL) 50 MG 24 hr tablet [[id number]]   Order Details Dose: 50 mg Route: Oral Frequency: Daily  Dispense Quantity: 90 tablet Refills: 0       Sig: Take 1 tablet (50 mg total) by mouth daily.       4)--- Olmesartan-amLODIPine-HCTZ 20-5-12.5 MG TABS [[id number]]   Order Details Dose, Route, Frequency: As Directed  Dispense Quantity: 90 tablet Refills: 0       Sig: TAKE 1 TABLET BY MOUTH DAILY.   --- Forwarding request to med asst that pt's LOV was 09/15/19  Also patient uses this pharmacy :   [place], [place] [phone] (Phone) [phone] (Fax)   Please contact pt if there are any questions or concerns.  --glh

## 2019-12-16 NOTE — Telephone Encounter (Signed)
Please call pt to schedule appt.  No further refills until pt is seen.  T. Zander Ingham, CMA  

## 2020-02-11 ENCOUNTER — Emergency Department (HOSPITAL_COMMUNITY): Payer: Medicare Other

## 2020-02-11 ENCOUNTER — Inpatient Hospital Stay (HOSPITAL_COMMUNITY): Payer: Medicare Other

## 2020-02-11 ENCOUNTER — Other Ambulatory Visit: Payer: Self-pay

## 2020-02-11 ENCOUNTER — Inpatient Hospital Stay (HOSPITAL_COMMUNITY)
Admission: EM | Admit: 2020-02-11 | Discharge: 2020-02-23 | DRG: 478 | Disposition: A | Discharge status: Hospice | Payer: Medicare Other | Attending: Internal Medicine | Admitting: Internal Medicine

## 2020-02-11 ENCOUNTER — Encounter (HOSPITAL_COMMUNITY): Payer: Self-pay

## 2020-02-11 DIAGNOSIS — I9589 Other hypotension: Secondary | ICD-10-CM | POA: Diagnosis not present

## 2020-02-11 DIAGNOSIS — E860 Dehydration: Secondary | ICD-10-CM | POA: Diagnosis present

## 2020-02-11 DIAGNOSIS — R911 Solitary pulmonary nodule: Secondary | ICD-10-CM | POA: Diagnosis present

## 2020-02-11 DIAGNOSIS — Z7401 Bed confinement status: Secondary | ICD-10-CM | POA: Diagnosis not present

## 2020-02-11 DIAGNOSIS — Z20822 Contact with and (suspected) exposure to covid-19: Secondary | ICD-10-CM | POA: Diagnosis present

## 2020-02-11 DIAGNOSIS — M549 Dorsalgia, unspecified: Secondary | ICD-10-CM

## 2020-02-11 DIAGNOSIS — M2550 Pain in unspecified joint: Secondary | ICD-10-CM | POA: Diagnosis not present

## 2020-02-11 DIAGNOSIS — R7989 Other specified abnormal findings of blood chemistry: Secondary | ICD-10-CM | POA: Diagnosis not present

## 2020-02-11 DIAGNOSIS — M5440 Lumbago with sciatica, unspecified side: Secondary | ICD-10-CM

## 2020-02-11 DIAGNOSIS — Z87891 Personal history of nicotine dependence: Secondary | ICD-10-CM | POA: Diagnosis not present

## 2020-02-11 DIAGNOSIS — I110 Hypertensive heart disease with heart failure: Secondary | ICD-10-CM | POA: Diagnosis present

## 2020-02-11 DIAGNOSIS — L89151 Pressure ulcer of sacral region, stage 1: Secondary | ICD-10-CM | POA: Diagnosis present

## 2020-02-11 DIAGNOSIS — M8448XA Pathological fracture, other site, initial encounter for fracture: Secondary | ICD-10-CM | POA: Diagnosis not present

## 2020-02-11 DIAGNOSIS — G8929 Other chronic pain: Secondary | ICD-10-CM | POA: Diagnosis not present

## 2020-02-11 DIAGNOSIS — R0602 Shortness of breath: Secondary | ICD-10-CM | POA: Diagnosis not present

## 2020-02-11 DIAGNOSIS — Z8249 Family history of ischemic heart disease and other diseases of the circulatory system: Secondary | ICD-10-CM

## 2020-02-11 DIAGNOSIS — E785 Hyperlipidemia, unspecified: Secondary | ICD-10-CM | POA: Diagnosis present

## 2020-02-11 DIAGNOSIS — J9611 Chronic respiratory failure with hypoxia: Secondary | ICD-10-CM | POA: Diagnosis present

## 2020-02-11 DIAGNOSIS — N179 Acute kidney failure, unspecified: Secondary | ICD-10-CM | POA: Diagnosis present

## 2020-02-11 DIAGNOSIS — R531 Weakness: Secondary | ICD-10-CM

## 2020-02-11 DIAGNOSIS — G893 Neoplasm related pain (acute) (chronic): Secondary | ICD-10-CM | POA: Diagnosis present

## 2020-02-11 DIAGNOSIS — K5900 Constipation, unspecified: Secondary | ICD-10-CM | POA: Diagnosis present

## 2020-02-11 DIAGNOSIS — I959 Hypotension, unspecified: Secondary | ICD-10-CM | POA: Diagnosis present

## 2020-02-11 DIAGNOSIS — Z9981 Dependence on supplemental oxygen: Secondary | ICD-10-CM | POA: Diagnosis not present

## 2020-02-11 DIAGNOSIS — R52 Pain, unspecified: Secondary | ICD-10-CM | POA: Diagnosis not present

## 2020-02-11 DIAGNOSIS — Z825 Family history of asthma and other chronic lower respiratory diseases: Secondary | ICD-10-CM

## 2020-02-11 DIAGNOSIS — Z823 Family history of stroke: Secondary | ICD-10-CM | POA: Diagnosis not present

## 2020-02-11 DIAGNOSIS — E861 Hypovolemia: Secondary | ICD-10-CM

## 2020-02-11 DIAGNOSIS — D649 Anemia, unspecified: Secondary | ICD-10-CM | POA: Diagnosis present

## 2020-02-11 DIAGNOSIS — Z7901 Long term (current) use of anticoagulants: Secondary | ICD-10-CM | POA: Diagnosis not present

## 2020-02-11 DIAGNOSIS — M8458XA Pathological fracture in neoplastic disease, other specified site, initial encounter for fracture: Principal | ICD-10-CM | POA: Diagnosis present

## 2020-02-11 DIAGNOSIS — J449 Chronic obstructive pulmonary disease, unspecified: Secondary | ICD-10-CM | POA: Diagnosis present

## 2020-02-11 DIAGNOSIS — R748 Abnormal levels of other serum enzymes: Secondary | ICD-10-CM | POA: Diagnosis present

## 2020-02-11 DIAGNOSIS — M8458XD Pathological fracture in neoplastic disease, other specified site, subsequent encounter for fracture with routine healing: Secondary | ICD-10-CM | POA: Diagnosis not present

## 2020-02-11 DIAGNOSIS — I714 Abdominal aortic aneurysm, without rupture: Secondary | ICD-10-CM | POA: Diagnosis present

## 2020-02-11 DIAGNOSIS — I5032 Chronic diastolic (congestive) heart failure: Secondary | ICD-10-CM | POA: Diagnosis present

## 2020-02-11 DIAGNOSIS — E872 Acidosis: Secondary | ICD-10-CM | POA: Diagnosis present

## 2020-02-11 DIAGNOSIS — C7951 Secondary malignant neoplasm of bone: Secondary | ICD-10-CM | POA: Diagnosis present

## 2020-02-11 DIAGNOSIS — M546 Pain in thoracic spine: Secondary | ICD-10-CM | POA: Diagnosis not present

## 2020-02-11 DIAGNOSIS — M898X8 Other specified disorders of bone, other site: Secondary | ICD-10-CM | POA: Diagnosis not present

## 2020-02-11 DIAGNOSIS — N281 Cyst of kidney, acquired: Secondary | ICD-10-CM | POA: Diagnosis not present

## 2020-02-11 DIAGNOSIS — Z66 Do not resuscitate: Secondary | ICD-10-CM | POA: Diagnosis not present

## 2020-02-11 DIAGNOSIS — Z7951 Long term (current) use of inhaled steroids: Secondary | ICD-10-CM

## 2020-02-11 DIAGNOSIS — M899 Disorder of bone, unspecified: Secondary | ICD-10-CM | POA: Diagnosis not present

## 2020-02-11 DIAGNOSIS — C3411 Malignant neoplasm of upper lobe, right bronchus or lung: Secondary | ICD-10-CM | POA: Diagnosis present

## 2020-02-11 DIAGNOSIS — E441 Mild protein-calorie malnutrition: Secondary | ICD-10-CM | POA: Diagnosis not present

## 2020-02-11 DIAGNOSIS — C801 Malignant (primary) neoplasm, unspecified: Secondary | ICD-10-CM | POA: Diagnosis not present

## 2020-02-11 DIAGNOSIS — J984 Other disorders of lung: Secondary | ICD-10-CM | POA: Diagnosis not present

## 2020-02-11 DIAGNOSIS — L8990 Pressure ulcer of unspecified site, unspecified stage: Secondary | ICD-10-CM | POA: Insufficient documentation

## 2020-02-11 DIAGNOSIS — M4804 Spinal stenosis, thoracic region: Secondary | ICD-10-CM | POA: Diagnosis present

## 2020-02-11 DIAGNOSIS — I700 Atherosclerosis of aorta: Secondary | ICD-10-CM | POA: Diagnosis not present

## 2020-02-11 DIAGNOSIS — S22089A Unspecified fracture of T11-T12 vertebra, initial encounter for closed fracture: Secondary | ICD-10-CM

## 2020-02-11 DIAGNOSIS — N400 Enlarged prostate without lower urinary tract symptoms: Secondary | ICD-10-CM | POA: Diagnosis not present

## 2020-02-11 DIAGNOSIS — Z7189 Other specified counseling: Secondary | ICD-10-CM | POA: Diagnosis not present

## 2020-02-11 DIAGNOSIS — J9809 Other diseases of bronchus, not elsewhere classified: Secondary | ICD-10-CM | POA: Diagnosis not present

## 2020-02-11 DIAGNOSIS — M545 Low back pain: Secondary | ICD-10-CM | POA: Diagnosis not present

## 2020-02-11 DIAGNOSIS — Z79899 Other long term (current) drug therapy: Secondary | ICD-10-CM

## 2020-02-11 DIAGNOSIS — I10 Essential (primary) hypertension: Secondary | ICD-10-CM | POA: Diagnosis present

## 2020-02-11 DIAGNOSIS — Z515 Encounter for palliative care: Secondary | ICD-10-CM | POA: Diagnosis not present

## 2020-02-11 DIAGNOSIS — C3490 Malignant neoplasm of unspecified part of unspecified bronchus or lung: Secondary | ICD-10-CM | POA: Diagnosis not present

## 2020-02-11 DIAGNOSIS — S22088A Other fracture of T11-T12 vertebra, initial encounter for closed fracture: Secondary | ICD-10-CM | POA: Diagnosis not present

## 2020-02-11 DIAGNOSIS — R791 Abnormal coagulation profile: Secondary | ICD-10-CM | POA: Diagnosis not present

## 2020-02-11 LAB — CBC WITH DIFFERENTIAL/PLATELET
Abs Immature Granulocytes: 0.2 10*3/uL — ABNORMAL HIGH (ref 0.00–0.07)
Basophils Absolute: 0.1 10*3/uL (ref 0.0–0.1)
Basophils Relative: 1 %
Eosinophils Absolute: 0 10*3/uL (ref 0.0–0.5)
Eosinophils Relative: 0 %
HCT: 44.5 % (ref 39.0–52.0)
Hemoglobin: 14 g/dL (ref 13.0–17.0)
Immature Granulocytes: 2 %
Lymphocytes Relative: 7 %
Lymphs Abs: 0.8 10*3/uL (ref 0.7–4.0)
MCH: 29.7 pg (ref 26.0–34.0)
MCHC: 31.5 g/dL (ref 30.0–36.0)
MCV: 94.5 fL (ref 80.0–100.0)
Monocytes Absolute: 1 10*3/uL (ref 0.1–1.0)
Monocytes Relative: 9 %
Neutro Abs: 9.1 10*3/uL — ABNORMAL HIGH (ref 1.7–7.7)
Neutrophils Relative %: 81 %
Platelets: 381 10*3/uL (ref 150–400)
RBC: 4.71 MIL/uL (ref 4.22–5.81)
RDW: 13.2 % (ref 11.5–15.5)
WBC: 11.1 10*3/uL — ABNORMAL HIGH (ref 4.0–10.5)
nRBC: 0 % (ref 0.0–0.2)

## 2020-02-11 LAB — LIPASE, BLOOD: Lipase: 49 U/L (ref 11–51)

## 2020-02-11 LAB — URINALYSIS, ROUTINE W REFLEX MICROSCOPIC
Bilirubin Urine: NEGATIVE
Glucose, UA: NEGATIVE mg/dL
Ketones, ur: NEGATIVE mg/dL
Leukocytes,Ua: NEGATIVE
Nitrite: NEGATIVE
Protein, ur: NEGATIVE mg/dL
Specific Gravity, Urine: 1.012 (ref 1.005–1.030)
pH: 5 (ref 5.0–8.0)

## 2020-02-11 LAB — COMPREHENSIVE METABOLIC PANEL
ALT: 25 U/L (ref 0–44)
AST: 41 U/L (ref 15–41)
Albumin: 3.7 g/dL (ref 3.5–5.0)
Alkaline Phosphatase: 115 U/L (ref 38–126)
Anion gap: 20 — ABNORMAL HIGH (ref 5–15)
BUN: 99 mg/dL — ABNORMAL HIGH (ref 8–23)
CO2: 25 mmol/L (ref 22–32)
Calcium: 11.8 mg/dL — ABNORMAL HIGH (ref 8.9–10.3)
Chloride: 96 mmol/L — ABNORMAL LOW (ref 98–111)
Creatinine, Ser: 2.52 mg/dL — ABNORMAL HIGH (ref 0.61–1.24)
GFR calc Af Amer: 28 mL/min — ABNORMAL LOW (ref >60)
GFR calc non Af Amer: 24 mL/min — ABNORMAL LOW (ref >60)
Glucose, Bld: 150 mg/dL — ABNORMAL HIGH (ref 70–99)
Potassium: 4.6 mmol/L (ref 3.5–5.1)
Sodium: 141 mmol/L (ref 135–145)
Total Bilirubin: 0.8 mg/dL (ref 0.3–1.2)
Total Protein: 8 g/dL (ref 6.5–8.1)

## 2020-02-11 LAB — LACTIC ACID, PLASMA
Lactic Acid, Venous: 3.3 mmol/L (ref 0.5–1.9)
Lactic Acid, Venous: 2.2 mmol/L (ref 0.5–1.9)

## 2020-02-11 LAB — NA AND K (SODIUM & POTASSIUM), RAND UR
Potassium Urine: 20 mmol/L
Sodium, Ur: 84 mmol/L

## 2020-02-11 LAB — SARS CORONAVIRUS 2 BY RT PCR (HOSPITAL ORDER, PERFORMED IN ~~LOC~~ HOSPITAL LAB): SARS Coronavirus 2: NEGATIVE

## 2020-02-11 LAB — D-DIMER, QUANTITATIVE: D-Dimer, Quant: >20 ug/mL-FEU — ABNORMAL HIGH (ref 0.00–0.50)

## 2020-02-11 LAB — ACETAMINOPHEN LEVEL: Acetaminophen (Tylenol), Serum: <10 ug/mL — ABNORMAL LOW (ref 10–30)

## 2020-02-11 LAB — BRAIN NATRIURETIC PEPTIDE: B Natriuretic Peptide: 26 pg/mL (ref 0.0–100.0)

## 2020-02-11 IMAGING — MR MR LUMBAR SPINE W/O CM
5 series · 32 of 48 positions shown · non-contrast
Comparison: None.

CLINICAL DATA: Low back pain with difficulty walking, persistently
worsening.

EXAM:
MRI LUMBAR SPINE WITHOUT CONTRAST
TECHNIQUE: Multiplanar, multisequence MR imaging of the lumbar spine was
performed. No intravenous contrast was administered.

[Series 5: T1 · sagittal · 4.0mm · 0.81mm/px · 6 of 19 slices shown (1 of 2)]
[im 1/19]
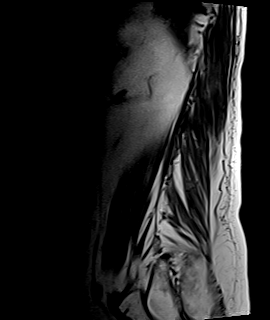
[im 4/19]
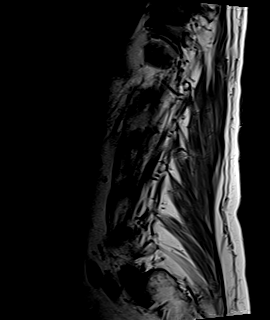
[im 8/19]
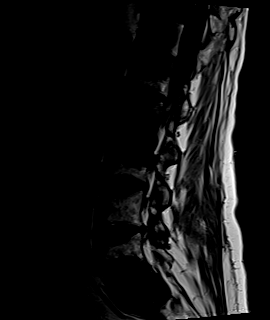
[im 11/19]
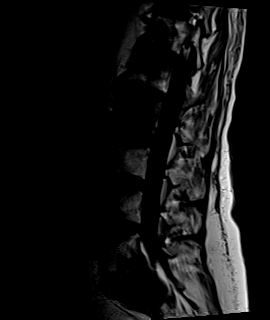
[im 15/19]
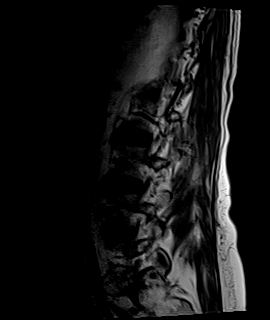
[im 19/19]
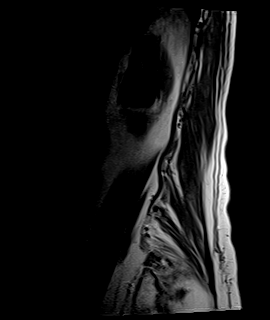

[Series 6: T2 · sagittal · 4.0mm · 0.81mm/px · 7 of 19 slices shown (1 of 2)]
[im 1/19]
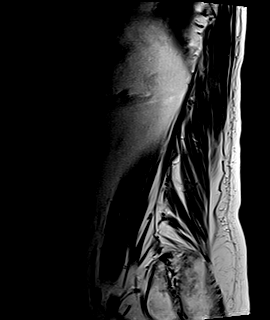
[im 4/19]
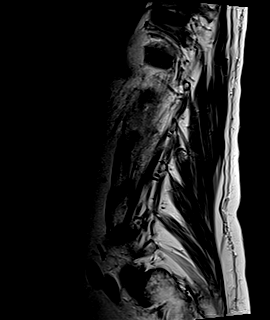
[im 7/19]
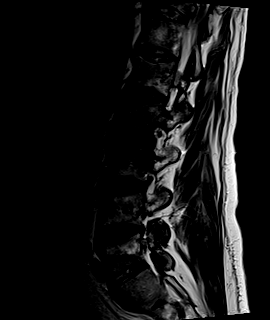
[im 10/19]
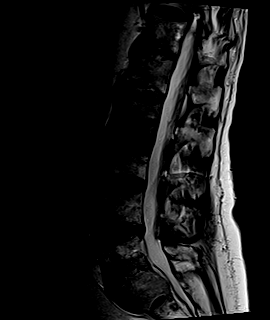
[im 13/19]
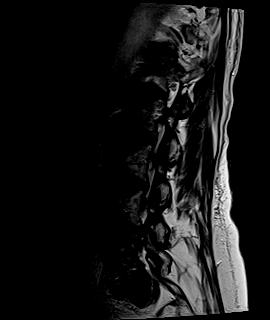
[im 16/19]
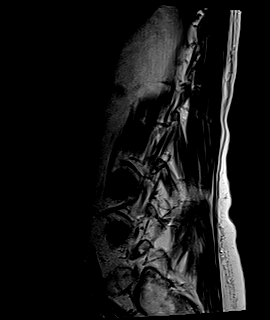
[im 19/19]
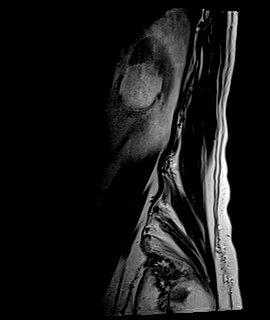

[Series 7: STIR · sagittal · 4.0mm · 0.51mm/px · 3 of 19 slices shown]
[im 1/19]
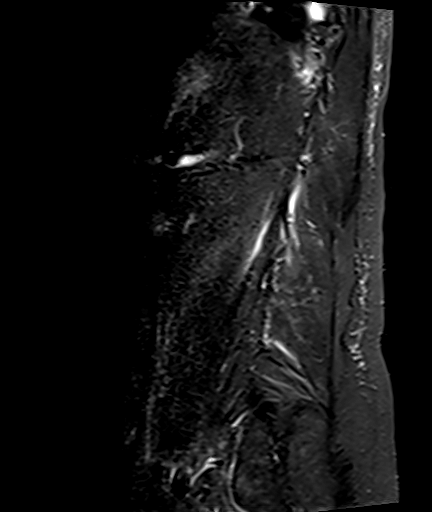
[im 4/19]
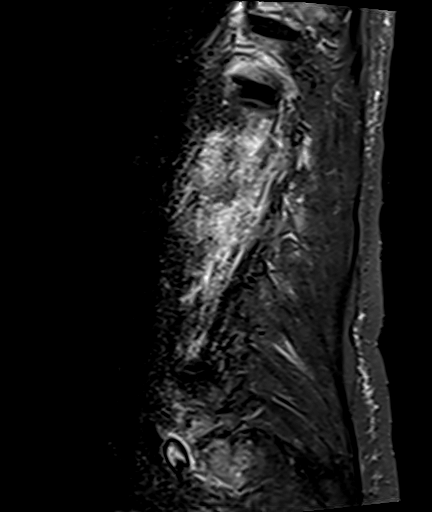
[im 7/19]
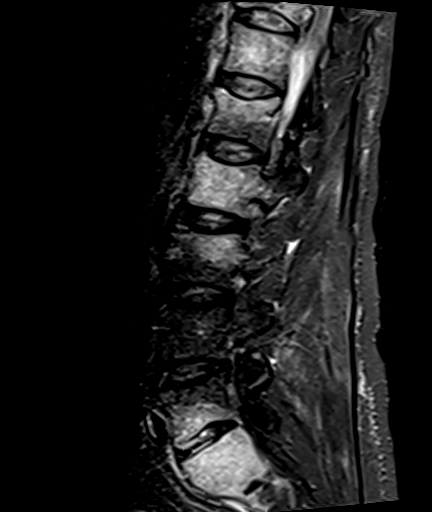

[Series 8: T2 · axial · 4.0mm · 0.62mm/px · z∈[-155,+72]mm · 8 of 41 slices shown (2 of 2)]
[im 1/41]
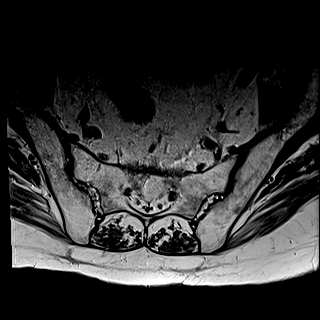
[im 7/41]
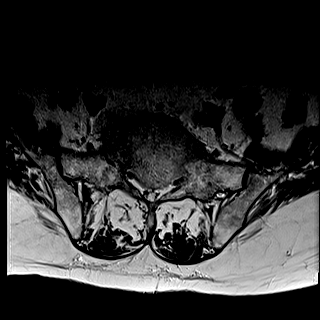
[im 13/41]
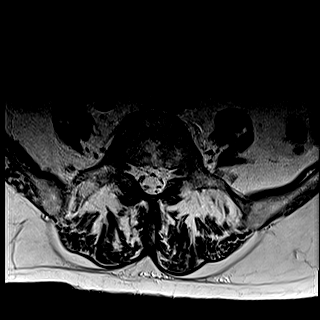
[im 19/41]
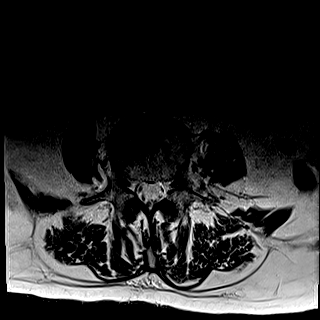
[im 22/41]
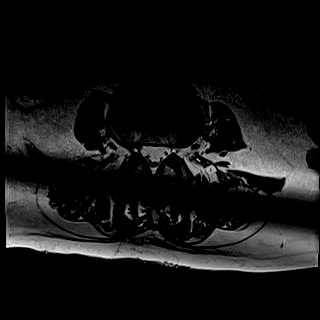
[im 28/41]
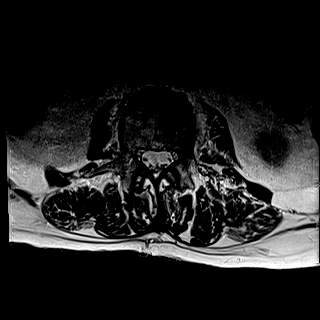
[im 34/41]
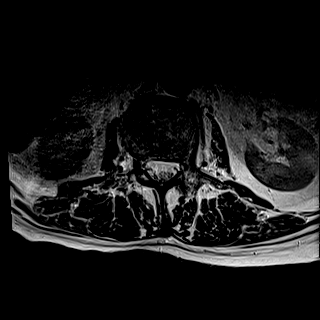
[im 41/41]
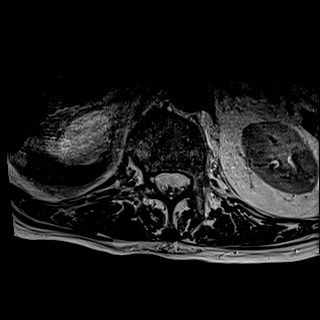

[Series 9: T1 · axial · 4.0mm · 0.39mm/px · z∈[-155,+72]mm · 8 of 41 slices shown (2 of 2)]
[im 1/41]
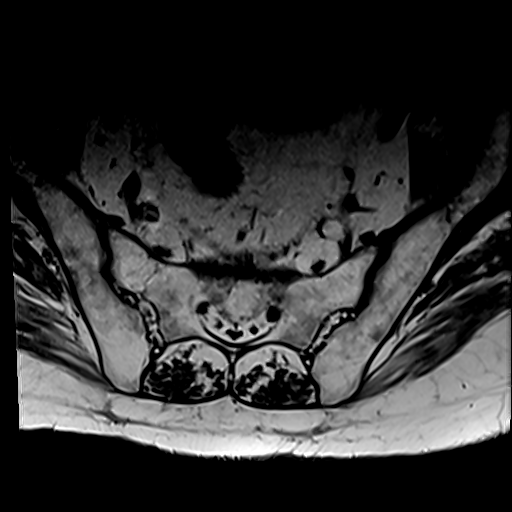
[im 7/41]
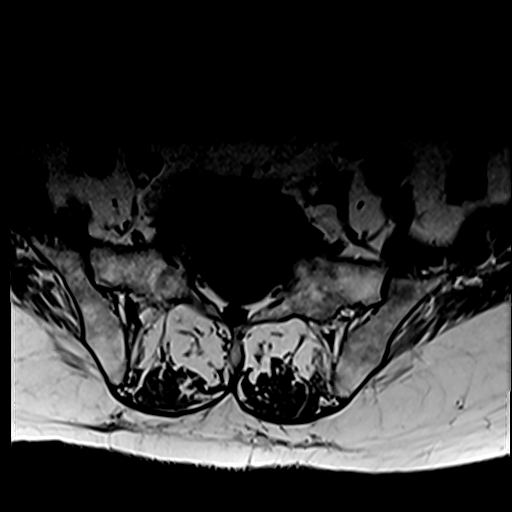
[im 13/41]
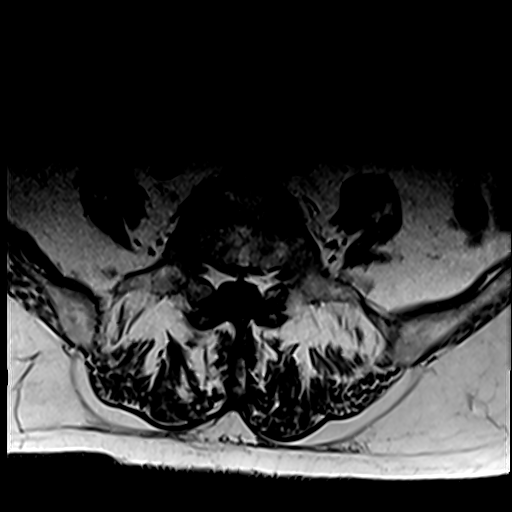
[im 19/41]
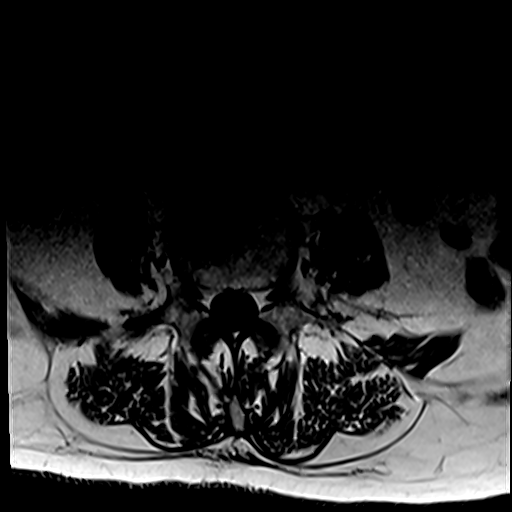
[im 22/41]
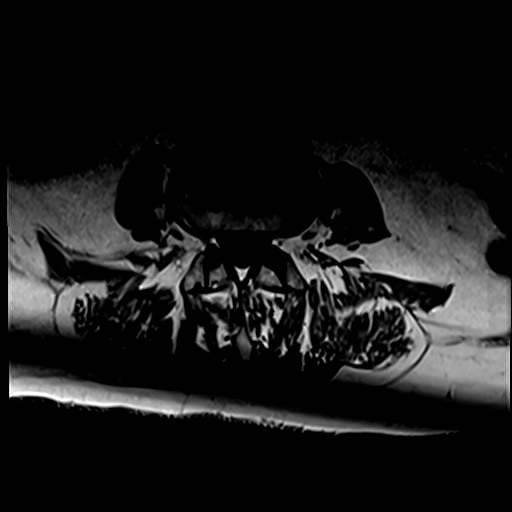
[im 28/41]
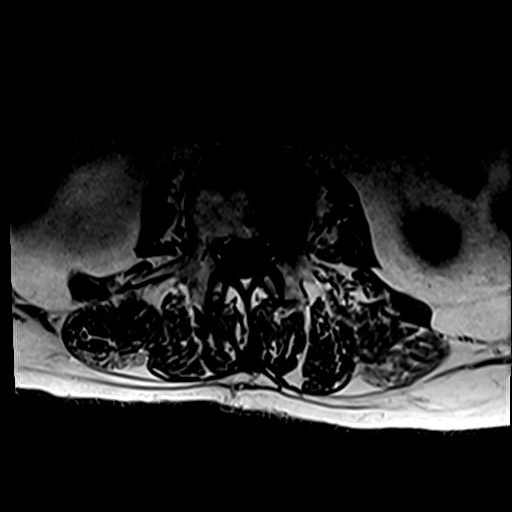
[im 34/41]
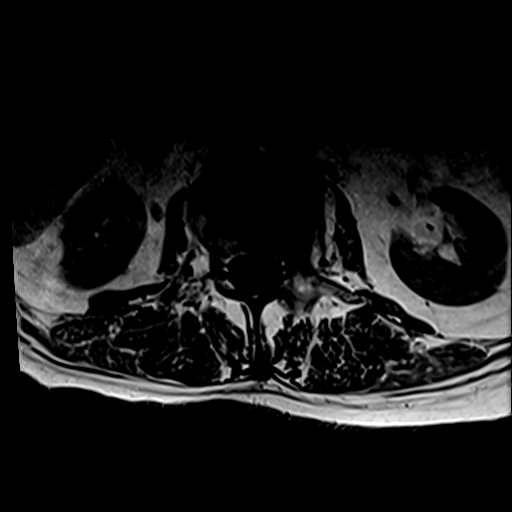
[im 41/41]
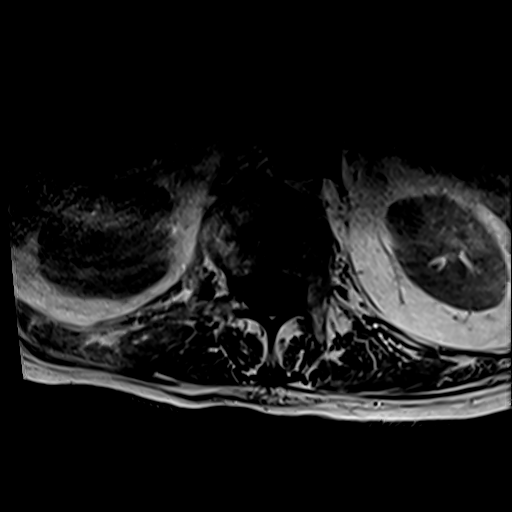

[32 of 48 positions shown; findings below may reference images not displayed]

FINDINGS: Segmentation:  5 lumbar type vertebral bodies.

Alignment:  Normal

Vertebrae: Metastatic disease or myeloma throughout the region with
involvement each vertebral level from inferior T11 through S1.
Extensive confluent disease replacing much of the marrow. Minor
superior endplate deformity at L1 which could be at a fracture
related to the underlying tumor or could be chronic. Extraosseous
tumor extension at the S1 level involving the ventral epidural
space. Lesion also seen within the right iliac bone. At the barely
visible inferior T11 level, it appears that tumor bulges into the
anterior aspect of the spinal canal. This was not studied
completely. There is some potential that this could affect the
distal thoracic cord.

Conus medullaris and cauda equina: Conus extends to the T12-L1
level. Conus and cauda equina appear normal.

Paraspinal and other soft tissues: Negative. No visible
retroperitoneal adenopathy. Question focal aneurysm of the
infrarenal abdominal aorta measuring up to 3.5 cm. This was not
fully evaluated.

Disc levels:

No disc pathology at L2-3 or above.

L3-4: Mild bulging of the disc.  No compressive stenosis.

L5-S1: Chronic fusion across this disc space. Sufficient patency of
the canal and foramina. As noted above, there is ventral epidural
bulging of tumor at the S1 level but this does not cause visible
neural compression.

L4-5: Disc bulge. Mild facet and ligamentous hypertrophy. Mild
bilateral lateral recess stenosis.
IMPRESSION: Extensive metastatic tumor or myeloma throughout the spine at all
levels imaged, from inferior T11 through S1.

Bulging of tumor into the ventral spinal canal at the inferior T11
level, barely visible on this study. Complete spinal imaging
recommended.

Bulging of tumor at the S1 level, encroaching upon the ventral
epidural space but without gross neural compression.

## 2020-02-11 IMAGING — CR DG CHEST 2V
2 series · 2 of 2 positions shown · non-contrast
Comparison: [DATE].

CLINICAL DATA: Shortness of breath.

EXAM:
CHEST - 2 VIEW

[w chest lat]
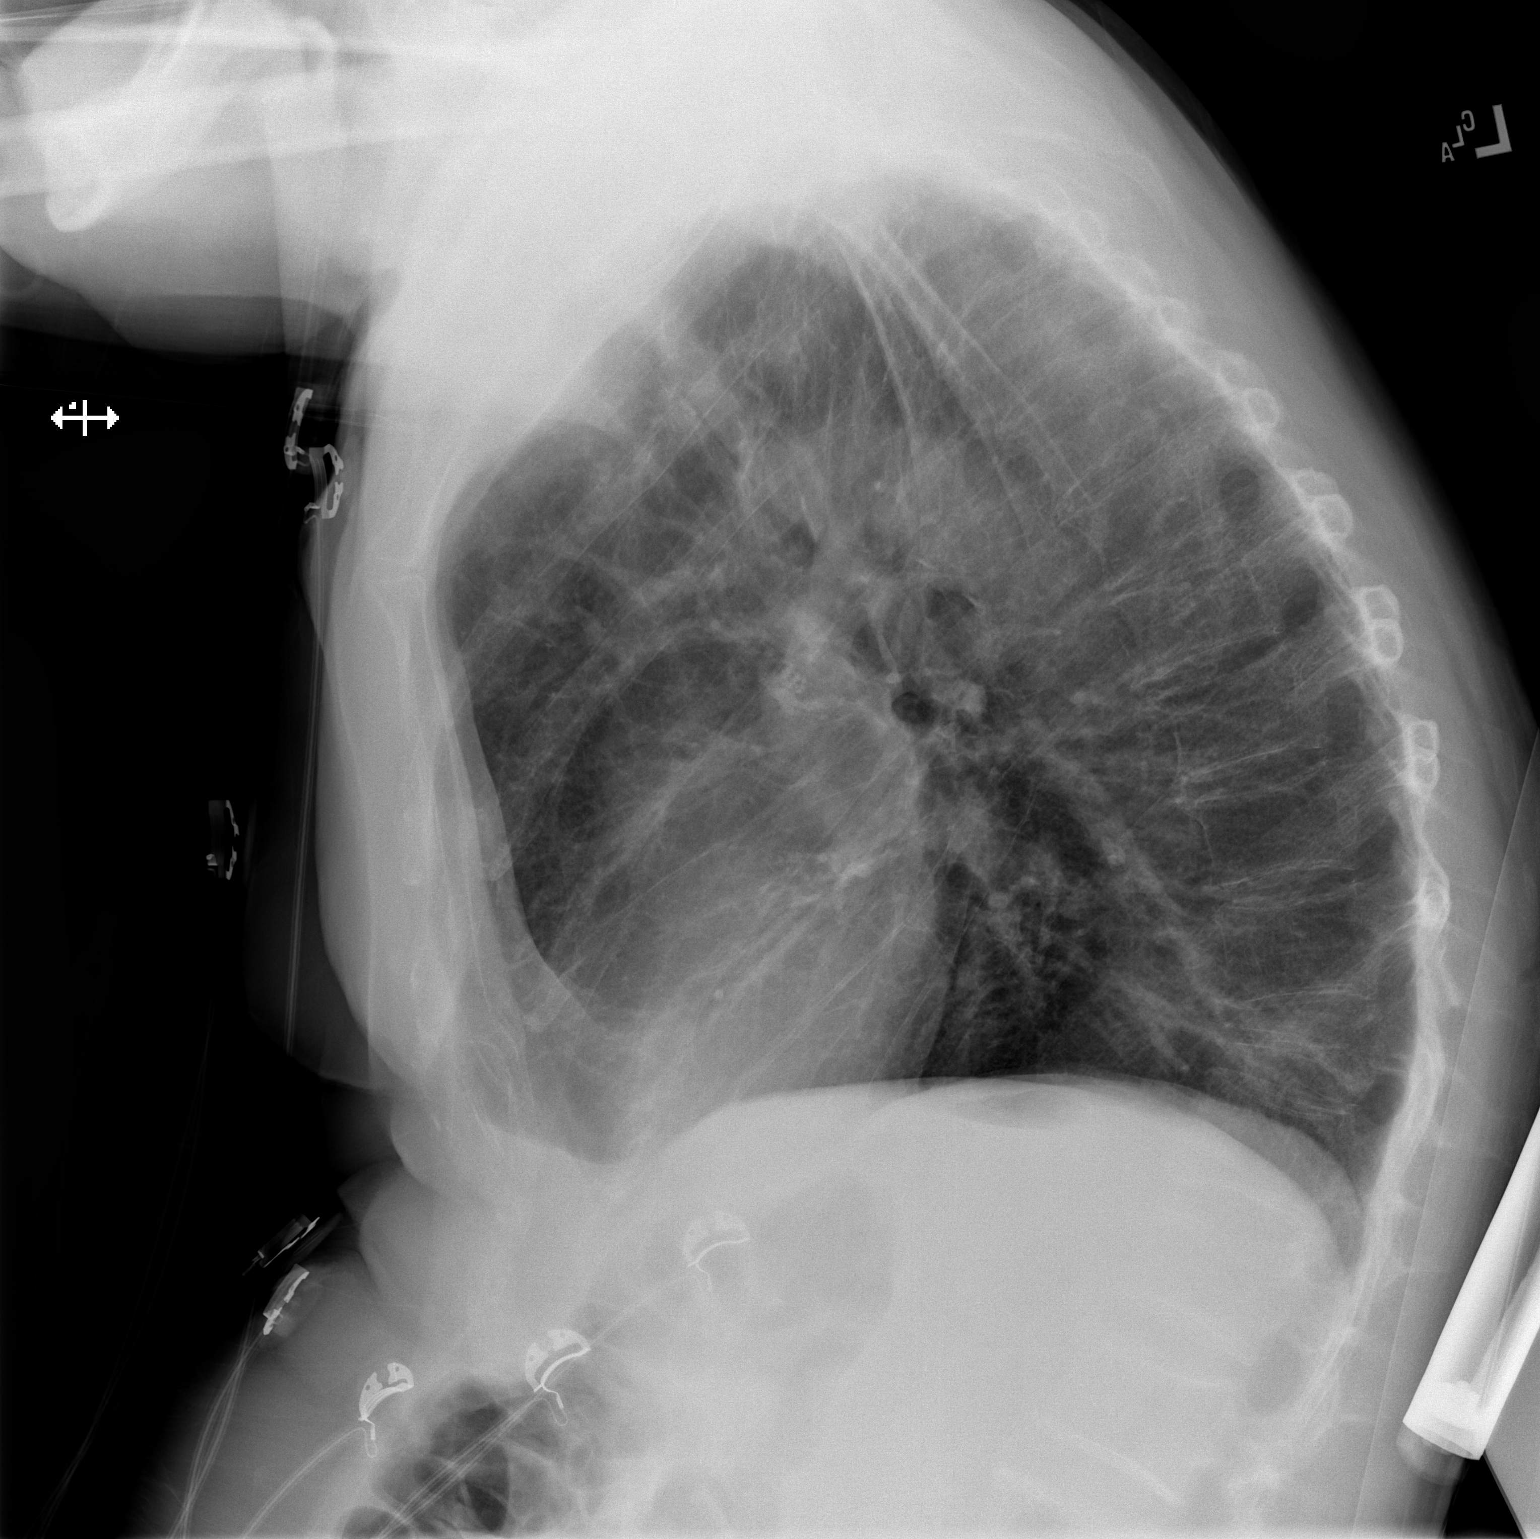

[x chest ap]
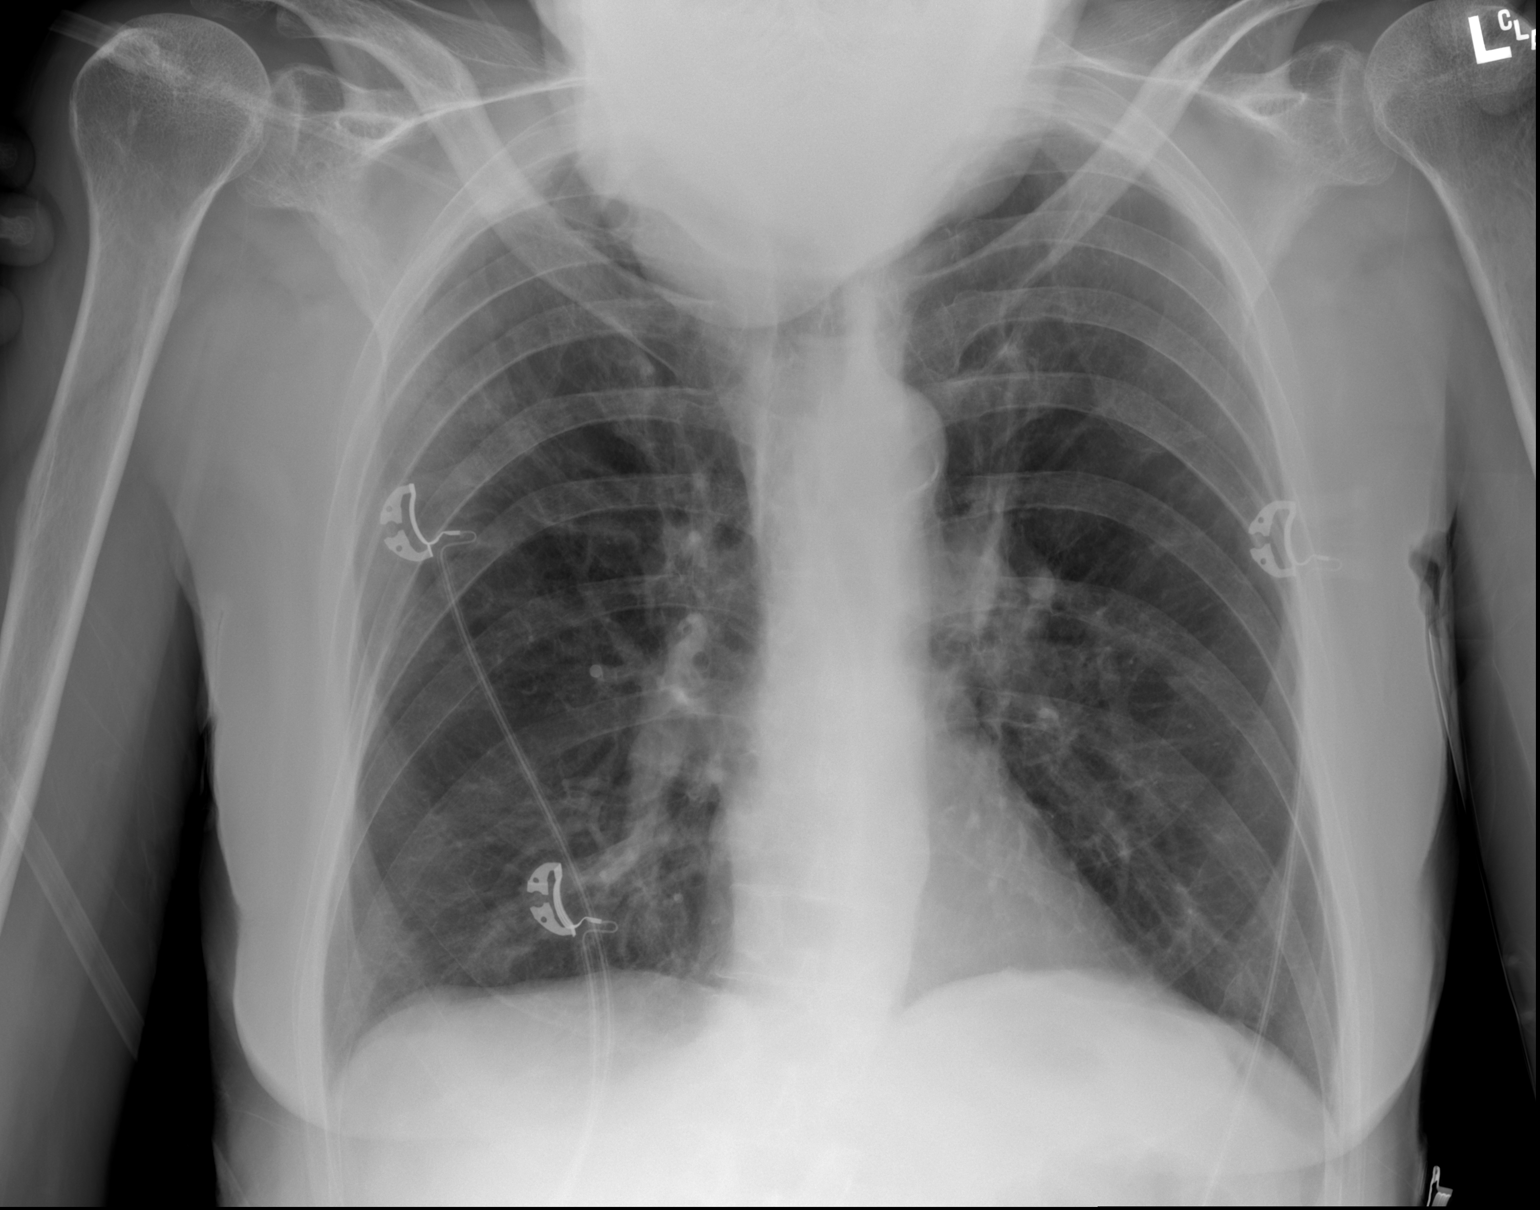

[2 of 2 positions shown; findings below may reference images not displayed]

FINDINGS: The heart size and mediastinal contours are within normal limits.
Both lungs are clear. No pneumothorax or pleural effusion is noted.
The visualized skeletal structures are unremarkable.
IMPRESSION: No active cardiopulmonary disease.

Aortic Atherosclerosis ([4A]-[4A]).

## 2020-02-11 IMAGING — US US RENAL
1 series · 14 of 25 positions shown · non-contrast
Comparison: Ultrasound [DATE]

CLINICAL DATA: Acute kidney injury

EXAM:
RENAL / URINARY TRACT ULTRASOUND COMPLETE

[Series 1: us renal · 14 of 32 slices shown]
[im 1/32]
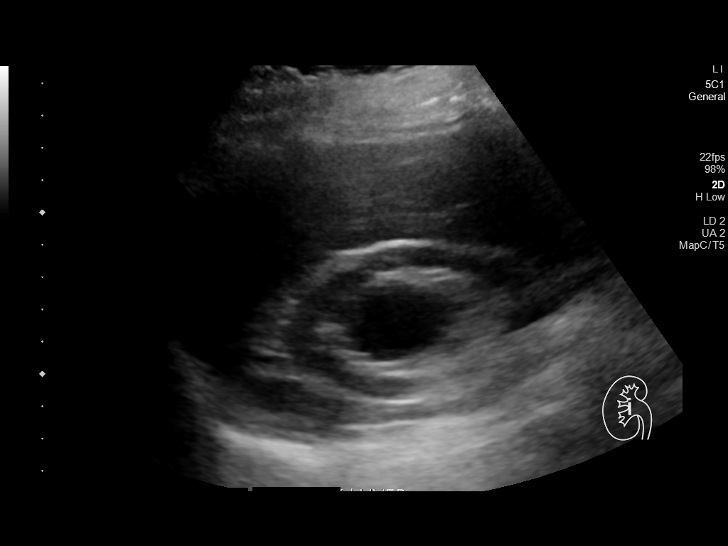
[im 3/32]
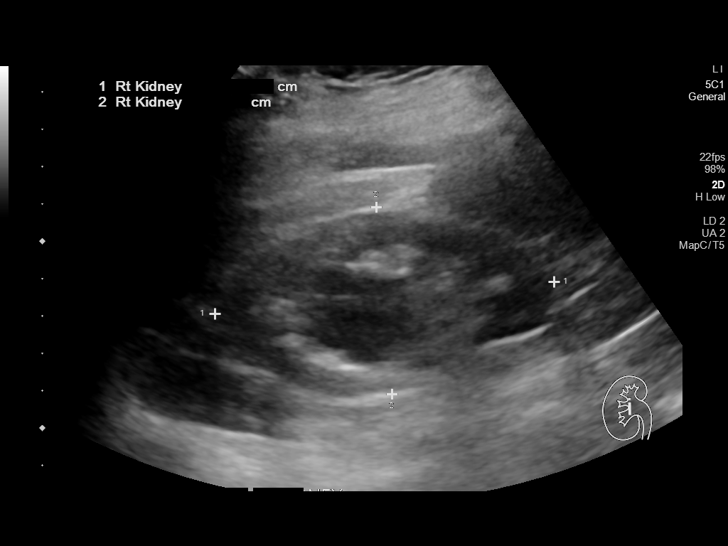
[im 6/32]
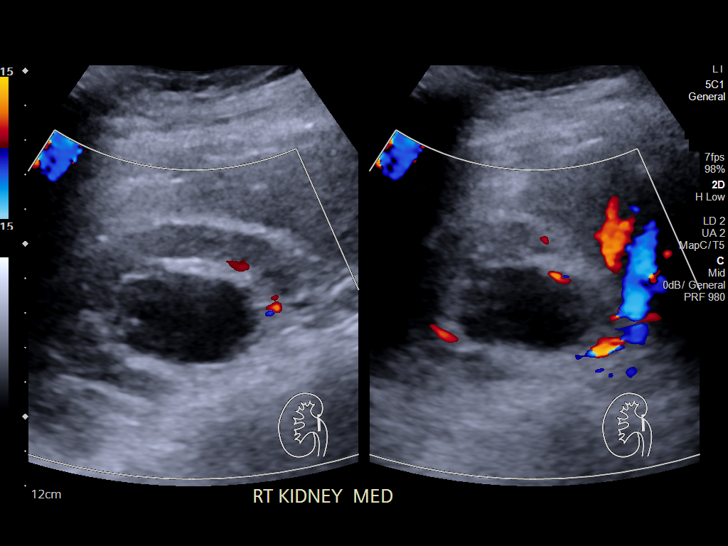
[im 8/32]
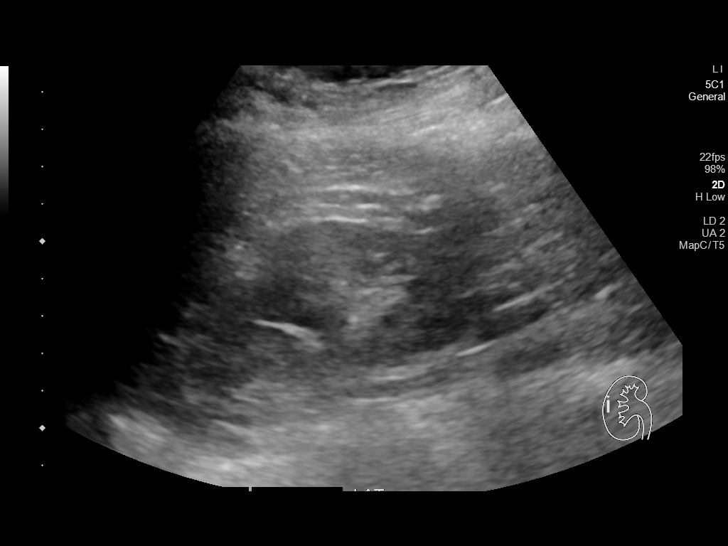
[im 11/32]
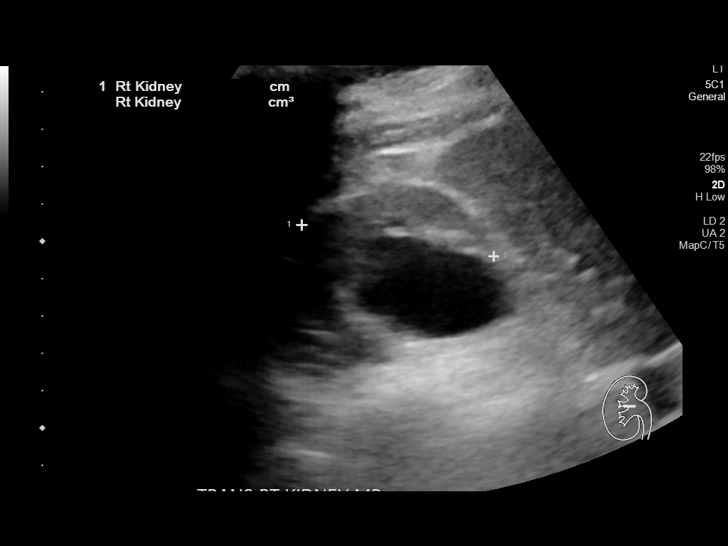
[im 12/32]
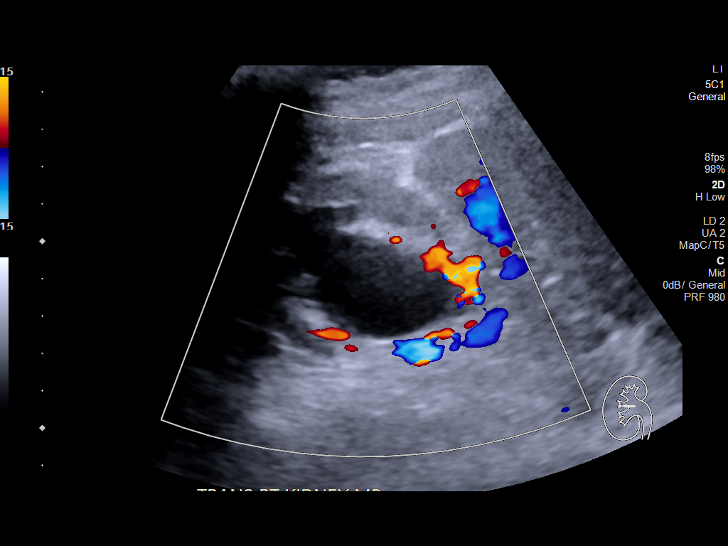
[im 15/32]
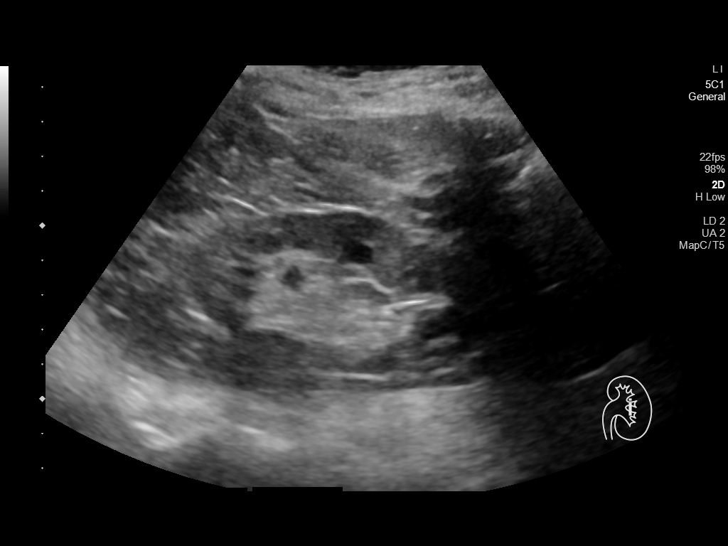
[im 17/32]
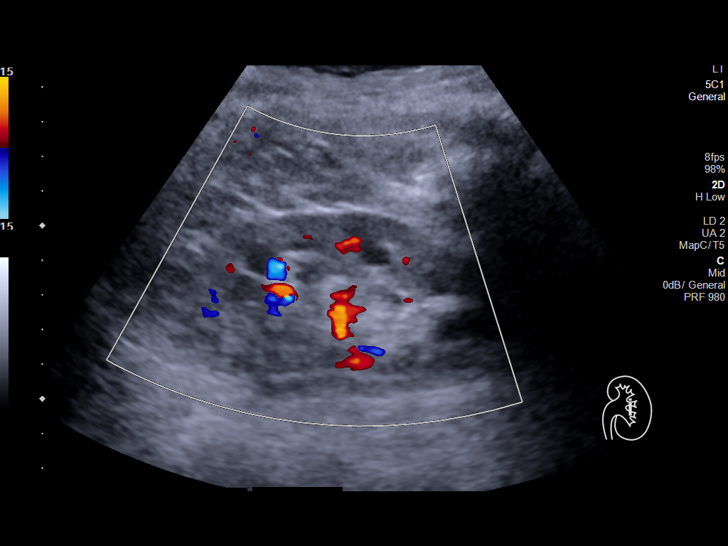
[im 20/32]
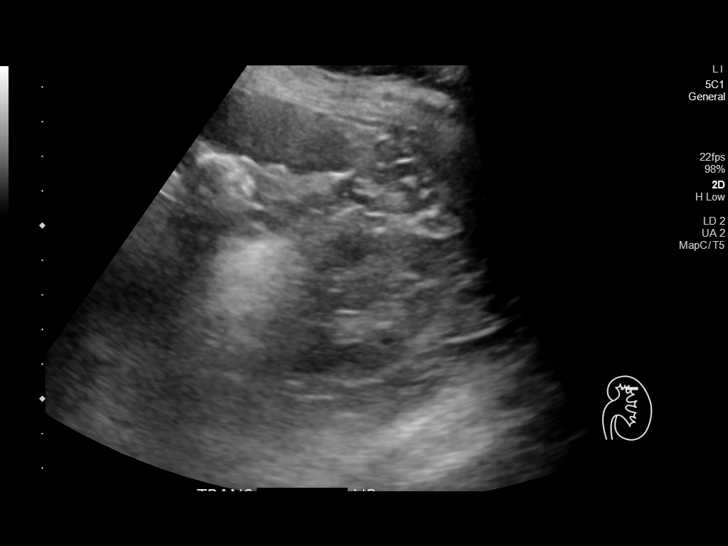
[im 21/32]
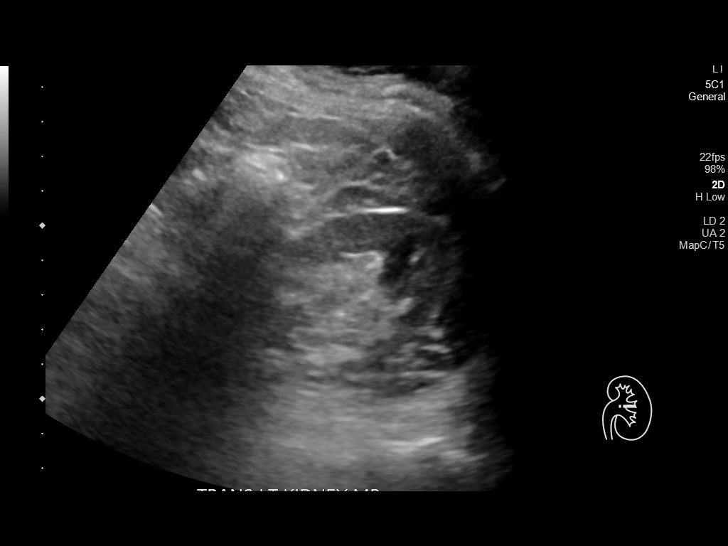
[im 24/32]
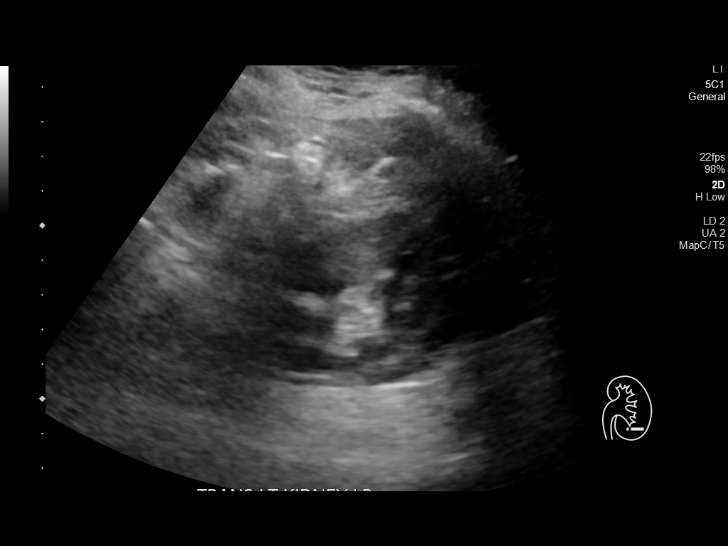
[im 26/32]
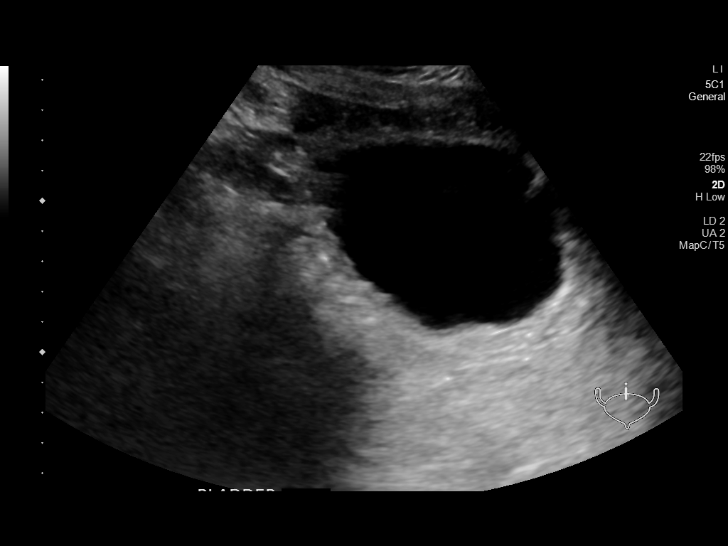
[im 29/32]
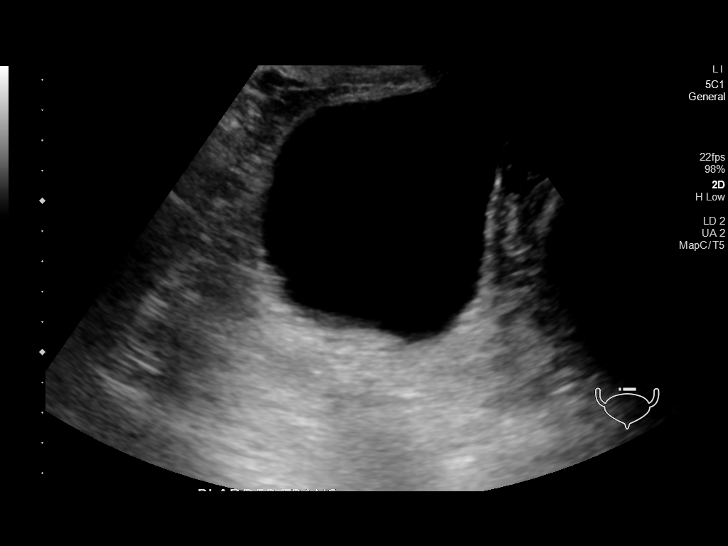
[im 32/32]
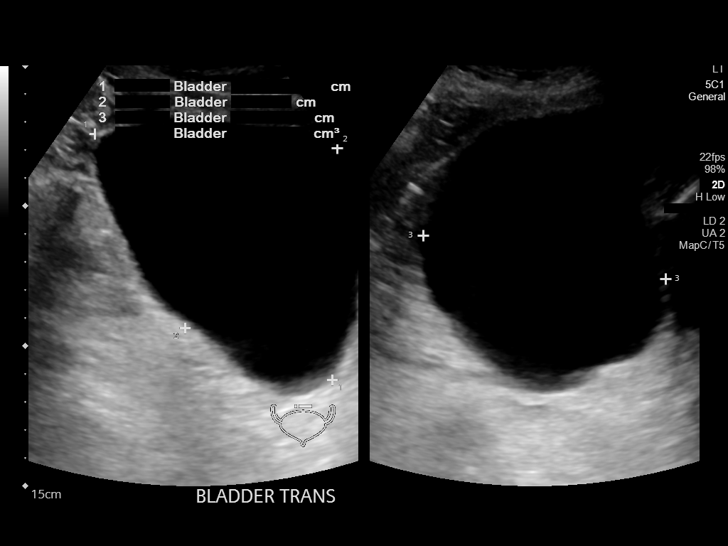

[14 of 25 positions shown; findings below may reference images not displayed]

FINDINGS: Right Kidney:

Renal measurements: 9.1 by 5 x 5.2 cm = volume: 125 mL. Cortical
echogenicity within normal limits. No hydronephrosis. 4.2 x 2.9 x
4.2 cm parapelvic cyst, previously 4.3 cm.

Left Kidney:

Renal measurements: 8.8 x 4.6 x 4.7 cm = volume: 100 mL.
Echogenicity within normal limits. No mass or hydronephrosis
visualized.

Bladder:

Appears normal for degree of bladder distention.

Other:

Bladder volume of 473 mL. Patient unable to void to get postvoid
residual.
IMPRESSION: 1. Negative for hydronephrosis.
2. Similar 4.3 cm right parapelvic cyst

## 2020-02-11 MED ORDER — SENNOSIDES-DOCUSATE SODIUM 8.6-50 MG PO TABS: 1.0000 | ORAL_TABLET | Freq: Every evening | ORAL | Status: DC | PRN

## 2020-02-11 MED ORDER — ALBUTEROL SULFATE (2.5 MG/3ML) 0.083% IN NEBU: 2.5000 mg | INHALATION_SOLUTION | Freq: Four times a day (QID) | RESPIRATORY_TRACT | Status: DC | PRN

## 2020-02-11 MED ORDER — HYDRALAZINE HCL 25 MG PO TABS
25.0000 mg | ORAL_TABLET | Freq: Three times a day (TID) | ORAL | Status: DC | PRN
  Administered 2020-02-17: 25 mg via ORAL
  Filled 2020-02-11: qty 1

## 2020-02-11 MED ORDER — UMECLIDINIUM BROMIDE 62.5 MCG/INH IN AEPB
1.0000 | INHALATION_SPRAY | Freq: Every day | RESPIRATORY_TRACT | Status: DC
  Administered 2020-02-12 – 2020-02-23 (×12): 1 via RESPIRATORY_TRACT
  Filled 2020-02-11 (×2): qty 7

## 2020-02-11 MED ORDER — ONDANSETRON HCL 4 MG/2ML IJ SOLN
4.0000 mg | Freq: Once | INTRAMUSCULAR | Status: AC
  Administered 2020-02-11: 4 mg via INTRAVENOUS
  Filled 2020-02-11: qty 2

## 2020-02-11 MED ORDER — ONDANSETRON HCL 4 MG PO TABS
4.0000 mg | ORAL_TABLET | Freq: Four times a day (QID) | ORAL | Status: DC | PRN
  Filled 2020-02-11: qty 1

## 2020-02-11 MED ORDER — ATORVASTATIN CALCIUM 40 MG PO TABS
80.0000 mg | ORAL_TABLET | Freq: Every day | ORAL | Status: DC
  Administered 2020-02-12 – 2020-02-22 (×11): 80 mg via ORAL
  Filled 2020-02-11 (×12): qty 2

## 2020-02-11 MED ORDER — FLUTICASONE-UMECLIDIN-VILANT 100-62.5-25 MCG/INH IN AEPB: 1.0000 | INHALATION_SPRAY | Freq: Every day | RESPIRATORY_TRACT | Status: DC

## 2020-02-11 MED ORDER — ALBUTEROL SULFATE HFA 108 (90 BASE) MCG/ACT IN AERS: 2.0000 | INHALATION_SPRAY | RESPIRATORY_TRACT | Status: DC | PRN

## 2020-02-11 MED ORDER — OXYCODONE HCL 5 MG PO TABS
5.0000 mg | ORAL_TABLET | Freq: Four times a day (QID) | ORAL | Status: DC | PRN
  Administered 2020-02-11: 5 mg via ORAL
  Filled 2020-02-11: qty 1

## 2020-02-11 MED ORDER — LACTATED RINGERS IV BOLUS
1000.0000 mL | Freq: Once | INTRAVENOUS | Status: AC
  Administered 2020-02-11: 1000 mL via INTRAVENOUS

## 2020-02-11 MED ORDER — ONDANSETRON HCL 4 MG/2ML IJ SOLN
4.0000 mg | Freq: Four times a day (QID) | INTRAMUSCULAR | Status: DC | PRN
  Administered 2020-02-19: 17:00:00 4 mg via INTRAVENOUS
  Filled 2020-02-11: qty 2

## 2020-02-11 MED ORDER — ACETAMINOPHEN 325 MG PO TABS
650.0000 mg | ORAL_TABLET | Freq: Four times a day (QID) | ORAL | Status: DC | PRN
  Administered 2020-02-12 – 2020-02-21 (×9): 650 mg via ORAL
  Filled 2020-02-11 (×12): qty 2

## 2020-02-11 MED ORDER — ENOXAPARIN SODIUM 30 MG/0.3ML ~~LOC~~ SOLN
30.0000 mg | SUBCUTANEOUS | Status: DC
  Administered 2020-02-11: 30 mg via SUBCUTANEOUS
  Filled 2020-02-11: qty 0.3

## 2020-02-11 MED ORDER — SODIUM CHLORIDE 0.9 % IV BOLUS
1000.0000 mL | Freq: Once | INTRAVENOUS | Status: AC
  Administered 2020-02-11: 1000 mL via INTRAVENOUS

## 2020-02-11 MED ORDER — FLUTICASONE FUROATE-VILANTEROL 100-25 MCG/INH IN AEPB
1.0000 | INHALATION_SPRAY | Freq: Every day | RESPIRATORY_TRACT | Status: DC
  Administered 2020-02-12 – 2020-02-23 (×12): 1 via RESPIRATORY_TRACT
  Filled 2020-02-11: qty 28

## 2020-02-11 MED ORDER — ACETAMINOPHEN 650 MG RE SUPP: 650.0000 mg | Freq: Four times a day (QID) | RECTAL | Status: DC | PRN

## 2020-02-11 MED ORDER — ALBUTEROL SULFATE (2.5 MG/3ML) 0.083% IN NEBU
2.5000 mg | INHALATION_SOLUTION | RESPIRATORY_TRACT | Status: DC | PRN
  Administered 2020-02-14: 2.5 mg via RESPIRATORY_TRACT
  Filled 2020-02-11: qty 3

## 2020-02-11 MED ORDER — TECHNETIUM TO 99M ALBUMIN AGGREGATED
3.8000 | Freq: Once | INTRAVENOUS | Status: AC
  Administered 2020-02-11: 3.8 via INTRAVENOUS

## 2020-02-11 MED ORDER — SODIUM CHLORIDE 0.9 % IV SOLN: INTRAVENOUS | Status: DC

## 2020-02-11 NOTE — H&P (Addendum)
History and Physical    George Frazier RCV:[id number] DOB: 08-22-1945 DOA: 02/11/2020  PCP: Patient, No Pcp Per   Patient coming from:   Chief Complaint  Patient presents with  . Weakness  . Shortness of Breath     HPI: George Frazier is a 74 y.o. male with medical history significant for COPD, on home o2 2l [place], lung nodule, hypertension, history of noncompliance, prediabetes, hx of AAA, hx of CHF- echo 2018 with EF 55 to 10% grade 1 diastolic dysfunction comes to the ED for evaluation of generalized weakness decreased appetite. Pt lives with his nephew. He C/o loss appetite, and loss of taste for 2 wks and had poor oral intake. Denies any diarrhea, nuasea vomiting, fever, focal weakness/ He has been c/o low back pain for the same duration, has been taking tylenol but no motrin. He says he is peeing fine, no incontinence. He complains of not being able to walk due to pain in back since last night as it hurts to stand up. He is abel to move his legs but unable to raise Rt leg fully due to pain.His Nephew used rollater and commode yesterday and had accident last night. He denies any dysurea/frequency or suprapubic pain.  Patient denies any fever, headache, focal weakness, abdominal pain, nausea, vomiting.  ED Course: Initially hypotensive in 25E to 52D systolic tachycardic in 782, needing 2 to 3 L supplemental nasal cannula oxygen.  Work showed elevated BUN 99 creatinine 2.5, lactic acid elevated 3.3.  WBC 11.1, BNP 26, D dimer more than 20.,  Chest x-ray-no acute finding.  Patient was afebrile.  Thought to be hypovolemic dehydrated, he is on multiple diuretics including Lasix, ARB and HCTZ at home and has been taking it regularly. Patient needed 3 L bolus Ringer's lactate and blood pressure has improved in 110s to 120s for last 2 hours and his lactic acid is downtrending 2.2.  Due to elevated D-dimer duplex of the leg and nuclear perfusion scan has been ordered and pending.  Review of Systems:  All systems were reviewed and were negative except as mentioned in HPI above. Negative for fever Negative for headache Negative for shortness of breath  Past Medical History:  Diagnosis Date  . CHF (congestive heart failure) ([place])   . COPD (chronic obstructive pulmonary disease) ([place])   . Hypertension     History reviewed. No pertinent surgical history.   reports that he quit smoking about 13 years ago. He has a 40.00 pack-year smoking history. He has quit using smokeless tobacco. He reports that he does not drink alcohol and does not use drugs.  No Known Allergies  Family History  Problem Relation Age of Onset  . CAD Mother 82  . Hypertension Mother   . Stroke Mother   . COPD Father 66  . AAA (abdominal aortic aneurysm) Father   . COPD Brother   . COPD Brother   . Cancer Neg Hx      Prior to Admission medications   Medication Sig Start Date End Date Taking? Authorizing Provider  albuterol (PROVENTIL HFA;VENTOLIN HFA) 108 (90 Base) MCG/ACT inhaler Inhale 2 puffs into the lungs every 4 (four) hours as needed for wheezing or shortness of breath. 09/21/17   Shahmehdi, Valeria Batman, MD  albuterol (PROVENTIL) (2.5 MG/3ML) 0.083% nebulizer solution Take 3 mLs (2.5 mg total) by nebulization 3 (three) times daily. 09/05/18   Mannam, Hart Robinsons, MD  atorvastatin (LIPITOR) 80 MG tablet Take 1 tablet (80 mg total) by mouth  daily at 6 PM. 12/16/19   Abonza, Maritza, PA-C  Black Elderberry 50 MG/5ML SYRP Take 10 mLs by mouth daily.    [provider]  Cholecalciferol (VITAMIN D3) 5000 units TABS 5,000 IU OTC vitamin D3 daily. 12/13/17   Opalski, Neoma Laming, DO  furosemide (LASIX) 20 MG tablet Take 0.5 tablets (10 mg total) by mouth daily. 12/16/19   Lorrene Reid, PA-C  metoprolol succinate (TOPROL-XL) 50 MG 24 hr tablet Take 1 tablet (50 mg total) by mouth daily. 12/16/19   Abonza, Maritza, PA-C  Olmesartan-amLODIPine-HCTZ 20-5-12.5 MG TABS TAKE 1 TABLET BY MOUTH DAILY. 12/16/19   Abonza, Maritza,  PA-C  TRELEGY ELLIPTA 100-62.5-25 MCG/INH AEPB INHALE 1 PUFF INTO THE LUNGS DAILY. NEED APPOINTMENT FOR FURTHER REFILLS 12/09/19   Marshell Garfinkel, MD    Physical Exam: Vitals:   02/11/20 1415 02/11/20 1455 02/11/20 1530 02/11/20 1545  BP: (!) 116/53 (!) 83/74 125/76 (!) 110/88  Pulse: 93 87 85 95  Resp: 20 20 18 18   Temp:      TempSrc:      SpO2: 96% 100% 96% 100%  Weight:      Height:        General exam: AAO x3, NAD, weak appearing.  Appears ill and frail HEENT:Oral mucosa moist, Ear/Nose WNL grossly, dentition normal. Respiratory system: bilaterally clear,no wheezing or crackles,no use of accessory muscle Cardiovascular system: S1 & S2 +, No JVD,. Gastrointestinal system: Abdomen soft, NT,ND, BS+ Nervous System:Alert, awake, moving extremities , unable to full extend in rt leg due to pain in his back, Intact sensation in his extremities, no saddle anesthesia. Extremities: No edema, distal peripheral pulses palpable.  Skin: No rashes,no icterus. MSK: Normal muscle bulk,tone, power   Labs on Admission: I have personally reviewed following labs and imaging studies  CBC: Recent Labs  Lab 02/11/20 1249  WBC 11.1*  NEUTROABS 9.1*  HGB 14.0  HCT 44.5  MCV 94.5  PLT 062   Basic Metabolic Panel: Recent Labs  Lab 02/11/20 1249  NA 141  K 4.6  CL 96*  CO2 25  GLUCOSE 150*  BUN 99*  CREATININE 2.52*  CALCIUM 11.8*   GFR: Estimated Creatinine Clearance: 28.2 mL/min (A) (by C-G formula based on SCr of 2.52 mg/dL (H)). Liver Function Tests: Recent Labs  Lab 02/11/20 1249  AST 41  ALT 25  ALKPHOS 115  BILITOT 0.8  PROT 8.0  ALBUMIN 3.7   Recent Labs  Lab 02/11/20 1249  LIPASE 49   No results for input(s): AMMONIA in the last 168 hours. Coagulation Profile: No results for input(s): INR, PROTIME in the last 168 hours. Cardiac Enzymes: No results for input(s): CKTOTAL, CKMB, CKMBINDEX, TROPONINI in the last 168 hours. BNP (last 3 results) No results for  input(s): PROBNP in the last 8760 hours. HbA1C: No results for input(s): HGBA1C in the last 72 hours. CBG: No results for input(s): GLUCAP in the last 168 hours. Lipid Profile: No results for input(s): CHOL, HDL, LDLCALC, TRIG, CHOLHDL, LDLDIRECT in the last 72 hours. Thyroid Function Tests: No results for input(s): TSH, T4TOTAL, FREET4, T3FREE, THYROIDAB in the last 72 hours. Anemia Panel: No results for input(s): VITAMINB12, FOLATE, FERRITIN, TIBC, IRON, RETICCTPCT in the last 72 hours. Urine analysis:    Component Value Date/Time   COLORURINE YELLOW 09/17/2017 1830   APPEARANCEUR CLEAR 09/17/2017 1830   LABSPEC 1.023 09/17/2017 1830   PHURINE 5.0 09/17/2017 1830   GLUCOSEU 50 (A) 09/17/2017 [place] NEGATIVE 09/17/2017 1830  [place] NEGATIVE 09/17/2017 [place] 09/17/2017 1830   PROTEINUR 100 (A) 09/17/2017 1830   NITRITE NEGATIVE 09/17/2017 1830   LEUKOCYTESUR NEGATIVE 09/17/2017 1830    Radiological Exams on Admission: DG Chest 2 View  Result Date: 02/11/2020 CLINICAL DATA:  Shortness of breath. EXAM: CHEST - 2 VIEW COMPARISON:  August 08, 2018. FINDINGS: The heart size and mediastinal contours are within normal limits. Both lungs are clear. No pneumothorax or pleural effusion is noted. The visualized skeletal structures are unremarkable. IMPRESSION: No active cardiopulmonary disease. Aortic Atherosclerosis (ICD10-I70.0). Electronically Signed   By: Marijo Conception M.D.   On: 02/11/2020 12:24   US RENAL  Result Date: 02/11/2020 CLINICAL DATA:  Acute kidney injury EXAM: RENAL / URINARY TRACT ULTRASOUND COMPLETE COMPARISON:  Ultrasound 09/18/2017 FINDINGS: Right Kidney: Renal measurements: 9.1 by 5 x 5.2 cm = volume: 125 mL. Cortical echogenicity within normal limits. No hydronephrosis. 4.2 x 2.9 x 4.2 cm parapelvic cyst, previously 4.3 cm. Left Kidney: Renal measurements: 8.8 x 4.6 x 4.7 cm = volume: 100 mL. Echogenicity within normal limits. No mass  or hydronephrosis visualized. Bladder: Appears normal for degree of bladder distention. Other: Bladder volume of 473 mL. Patient unable to void to get postvoid residual. IMPRESSION: 1. Negative for hydronephrosis. 2. Similar 4.3 cm right parapelvic cyst Electronically Signed   By: Donavan Foil M.D.   On: 02/11/2020 16:30     Assessment/Plan  Hypotension suspect to be from prerenal/volume depletion dehydration as well as recent diuretics and antihypertensive.  Holding his multiple antihypertensives and diuretics, he is on Lasix and HCTZ at home.  After IV fluid resuscitation blood pressure has improved.  Continue IV fluids, consult dietitian encourage oral intake.  No obvious signs and symptoms of infection currently denies any excessive cough, chest x-ray clear, no urinary symptoms.  No fever.  Lactic acidosis suspecting from hypotension: Level improving after IV fluids.  AKI suspecting from hypovolemia and volume depletion and antihypertensive medication including HCTZ, ARB use and Lasix.  We will hold the offending medication, hydrate him, avoid hypotension, check renal ultrasound electrolytes.  Labs in the morning  Low back pain:Taking tylenol at home, hx of back pain may years ago a and had seen chripractor many years ago.  No saddle anesthesia but reports unable to ambulate due to pain will obtain MRI lumbar spine.  Continue pain control.  Positive D-dimer, pending VQ scan for eval as well as LE duplex.  Tylenol use at home- level normal.  Chronic hypoxic respiratory failure on 2 L cannula: Continue the same. COPD, group D followed by [facility]: I will continue the patient on home inhaler, supplemental oxygen.  Is not currently wheezing.  Hypertension: Hypotension here and holding his home meds.  Solitary pulmonary nodule- R middle lobe- followed by pulm, it seems he had PET scan in 2020.  CHF hx, with preserved EF: currently volume depleted needing IV fluids.  BNP normal.   Once AKI improves adjust ivf  Hypercalcemia, ?etiology- repeat level after hydration, ? From dehydration,after hydration if still high would send further workup including vit d level, PTH, r/o myeloma.  Body mass index is 23.46 kg/m.   Severity of Illness:  * I certify that at the point of admission it is my clinical judgment that the patient will require inpatient hospital care spanning beyond 2 midnights from the point of admission due to high intensity of service, high risk for further deterioration and high frequency of surveillance required due to significant  AKI and hypotension   DVT prophylaxis: Lovenox Code Status: DNR as per patient's wishes.  Patient nephew at the bedside Family Communication: Admission, patients condition and plan of care including tests being ordered have been discussed with the patient and his nephew who indicate understanding and agree with the plan and Code Status.  Consults called:   Antonieta Pert MD [facility]  If 7PM-7AM, please contact night-coverage [email or web address]  02/11/2020, 4:38 PM

## 2020-02-11 NOTE — ED Notes (Signed)
Date and time results received: 02/11/20 3:09 PM (use smartphrase ".now" to insert current time)  Test: Lactic Critical Value: 3.3  Name of Provider Notified: Dr Antonieta Pert  Orders Received? Or Actions Taken?: Orders Received - See Orders for details

## 2020-02-11 NOTE — ED Provider Notes (Signed)
[facility] DEPT Provider Note   CSN: [id number] Arrival date & time: 02/11/20  1133     History Chief Complaint  Patient presents with  . Weakness  . Shortness of Breath    George Frazier is a 74 y.o. male presenting for evaluation of generalized weakness.  Patient states that the past 2 weeks, he has had gradually worsening poor appetite, generalized weakness, and generalized back pain.  Patient states there is a point where he can no longer walk due to both pain and weakness.  He has associated nausea, no vomiting.  He denies fevers, chills, chest pain, shortness of breath, cough, abdominal pain, urinary symptoms, abnormal bowel movements.  He wears oxygen at baseline, states he is not using any breathing treatments or inhalers.  He has not received his Covid vaccines.  He denies sick contacts.  He lives with his nephew at home.  Additional history taken chart review.  Patient with a history of COPD on oxygen, CHF, hypertension, AAA without rupture, history of noncompliance, prediabetes (2018).   HPI     Past Medical History:  Diagnosis Date  . CHF (congestive heart failure) ([place])   . COPD (chronic obstructive pulmonary disease) ([place])   . Hypertension     Patient Active Problem List   Diagnosis Date Noted  . AAA (abdominal aortic aneurysm) without rupture ([place]) 09/15/2019  . H/O noncompliance with medical treatment, presenting hazards to health 09/04/2019  . History of prediabetes-  A1c 6.1 in December 2018 12/23/2018  . Solitary pulmonary nodule- R middle lobe- followed by pulm 08/20/2018  . Accelerated hypertension 08/08/2018  . Hyperlipidemia 12/13/2017  . Vitamin D insufficiency 12/13/2017  . Physical deconditioning 11/15/2017  . Hypertension 11/06/2017  . Stopped smoking 2008 with greater than 40 pack year history 11/06/2017  . Chronic fatigue 11/06/2017  . Dyspnea on minimal exertion-likely due to COPD 11/06/2017  . Chronic  respiratory failure with hypoxia ([place]) 09/17/2017  . CAP (community acquired pneumonia) 07/14/2017  . COPD, group D, by GOLD 2017 classification ([place]) 07/14/2017  . Heart failure ([place]) 07/14/2017    History reviewed. No pertinent surgical history.     Family History  Problem Relation Age of Onset  . CAD Mother 37  . Hypertension Mother   . Stroke Mother   . COPD Father 45  . AAA (abdominal aortic aneurysm) Father   . COPD Brother   . COPD Brother   . Cancer Neg Hx     Social History   Tobacco Use  . Smoking status: Former Smoker    Packs/day: 1.00    Years: 40.00    Pack years: 40.00    Quit date: 2008    Years since quitting: 13.5  . Smokeless tobacco: Former Network engineer  . Vaping Use: Never used  Substance Use Topics  . Alcohol use: No  . Drug use: No    Home Medications Prior to Admission medications   Medication Sig Start Date End Date Taking? Authorizing Provider  albuterol (PROVENTIL HFA;VENTOLIN HFA) 108 (90 Base) MCG/ACT inhaler Inhale 2 puffs into the lungs every 4 (four) hours as needed for wheezing or shortness of breath. 09/21/17   Shahmehdi, Valeria Batman, MD  albuterol (PROVENTIL) (2.5 MG/3ML) 0.083% nebulizer solution Take 3 mLs (2.5 mg total) by nebulization 3 (three) times daily. 09/05/18   Mannam, Hart Robinsons, MD  atorvastatin (LIPITOR) 80 MG tablet Take 1 tablet (80 mg total) by mouth daily at 6 PM. 12/16/19   Abonza,  Maritza, PA-C  Black Elderberry 50 MG/5ML SYRP Take 10 mLs by mouth daily.    [provider]  Cholecalciferol (VITAMIN D3) 5000 units TABS 5,000 IU OTC vitamin D3 daily. 12/13/17   Opalski, Neoma Laming, DO  furosemide (LASIX) 20 MG tablet Take 0.5 tablets (10 mg total) by mouth daily. 12/16/19   Lorrene Reid, PA-C  metoprolol succinate (TOPROL-XL) 50 MG 24 hr tablet Take 1 tablet (50 mg total) by mouth daily. 12/16/19   Abonza, Maritza, PA-C  Olmesartan-amLODIPine-HCTZ 20-5-12.5 MG TABS TAKE 1 TABLET BY MOUTH DAILY. 12/16/19   Abonza, Maritza,  PA-C  TRELEGY ELLIPTA 100-62.5-25 MCG/INH AEPB INHALE 1 PUFF INTO THE LUNGS DAILY. NEED APPOINTMENT FOR FURTHER REFILLS 12/09/19   Marshell Garfinkel, MD    Allergies    Patient has no known allergies.  Review of Systems   Review of Systems  Constitutional: Positive for appetite change.  Gastrointestinal: Positive for nausea.  Musculoskeletal: Positive for back pain.  Neurological: Positive for weakness.  All other systems reviewed and are negative.   Physical Exam Updated Vital Signs BP (!) 116/53   Pulse 93   Temp (!) 97.5 F (36.4 C) (Oral)   Resp 20   Ht 6' (1.829 m)   Wt 78.5 kg   SpO2 96%   BMI 23.46 kg/m   Physical Exam Vitals and nursing note reviewed.  Constitutional:      General: He is not in acute distress.    Appearance: He is well-developed. He is ill-appearing.     Comments: Appears chronically ill  HENT:     Head: Normocephalic and atraumatic.  Eyes:     Extraocular Movements: Extraocular movements intact.     Conjunctiva/sclera: Conjunctivae normal.     Pupils: Pupils are equal, round, and reactive to light.  Cardiovascular:     Rate and Rhythm: Regular rhythm. Tachycardia present.     Pulses: Normal pulses.     Comments: Tachycardic around 115 Pulmonary:     Effort: Pulmonary effort is normal. No respiratory distress.     Breath sounds: Normal breath sounds. Decreased air movement present. No wheezing.     Comments: Decreased air movement in all fields.  Sats stable on home O2 Abdominal:     General: There is no distension.     Palpations: Abdomen is soft. There is no mass.     Tenderness: There is no abdominal tenderness. There is no guarding or rebound.     Comments: No TTP of the abdomen  Musculoskeletal:        General: Normal range of motion.     Cervical back: Normal range of motion and neck supple.     Comments: Generalized tenderness palpation of the entire back.  No focal pain.  No obvious deformity.  Pedal pulses 2+ bilaterally.  Good  distal sensation.  Patellar reflexes equal bilaterally.  No saddle paresthesias.  Skin:    General: Skin is warm and dry.     Capillary Refill: Capillary refill takes less than 2 seconds.  Neurological:     Mental Status: He is alert and oriented to person, place, and time.     ED Results / Procedures / Treatments   Labs (all labs ordered are listed, but only abnormal results are displayed) Labs Reviewed  CBC WITH DIFFERENTIAL/PLATELET - Abnormal; Notable for the following components:      Result Value   WBC 11.1 (*)    Neutro Abs 9.1 (*)    Abs Immature Granulocytes 0.20 (*)  All other components within normal limits  COMPREHENSIVE METABOLIC PANEL - Abnormal; Notable for the following components:   Chloride 96 (*)    Glucose, Bld 150 (*)    BUN 99 (*)    Creatinine, Ser 2.52 (*)    Calcium 11.8 (*)    GFR calc non Af Amer 24 (*)    GFR calc Af Amer 28 (*)    Anion gap 20 (*)    All other components within normal limits  D-DIMER, QUANTITATIVE (NOT AT [facility]) - Abnormal; Notable for the following components:   D-Dimer, Quant >20.00 (*)    All other components within normal limits  URINE CULTURE  SARS CORONAVIRUS 2 BY RT PCR (HOSPITAL ORDER, [facility])  LIPASE, BLOOD  URINALYSIS, ROUTINE W REFLEX MICROSCOPIC  BRAIN NATRIURETIC PEPTIDE  LACTIC ACID, PLASMA  LACTIC ACID, PLASMA  ACETAMINOPHEN LEVEL    EKG None  Radiology DG Chest 2 View  Result Date: 02/11/2020 CLINICAL DATA:  Shortness of breath. EXAM: CHEST - 2 VIEW COMPARISON:  August 08, 2018. FINDINGS: The heart size and mediastinal contours are within normal limits. Both lungs are clear. No pneumothorax or pleural effusion is noted. The visualized skeletal structures are unremarkable. IMPRESSION: No active cardiopulmonary disease. Aortic Atherosclerosis (ICD10-I70.0). Electronically Signed   By: Marijo Conception M.D.   On: 02/11/2020 12:24    Procedures .Critical Care Performed by:  Franchot Heidelberg, PA-C Authorized by: Franchot Heidelberg, PA-C   Critical care provider statement:    Critical care time (minutes):  50   Critical care time was exclusive of:  Separately billable procedures and treating other patients and teaching time   Critical care was necessary to treat or prevent imminent or life-threatening deterioration of the following conditions:  Dehydration   Critical care was time spent personally by me on the following activities:  Blood draw for specimens, development of treatment plan with patient or surrogate, discussions with primary provider, evaluation of patient's response to treatment, obtaining history from patient or surrogate, ordering and performing treatments and interventions, ordering and review of laboratory studies, ordering and review of radiographic studies, pulse oximetry, re-evaluation of patient's condition and review of old charts   I assumed direction of critical care for this patient from another provider in my specialty: no   Comments:     Pt hypotensive in the 60's on arrival. Required 2 L fluids and admission   (including critical care time)  Medications Ordered in ED Medications  sodium chloride 0.9 % bolus 1,000 mL (0 mLs Intravenous Stopped 02/11/20 1418)  ondansetron (ZOFRAN) injection 4 mg (4 mg Intravenous Given 02/11/20 1250)  lactated ringers bolus 1,000 mL (1,000 mLs Intravenous Bolus from Bag 02/11/20 1418)    ED Course  I have reviewed the triage vital signs and the nursing notes.  Pertinent labs & imaging results that were available during my care of the patient were reviewed by me and considered in my medical decision making (see chart for details).    MDM Rules/Calculators/A&P                          Patient presenting for evaluation of generalized weakness.  He reports decreased p.o. intake, which may be contributing to this.  On arrival, patient is found to be hypotensive in the 60s.  He is also tachycardic, but not  febrile.  Consider infection, though less likely without fever.  Consider PE.  Consider significant dehydration.  Consider electrolyte abnormality.  Will obtain labs, give fluids, a pain EKG, chest x-ray, and reassess.  Her liter of fluids, patient's blood pressure improved to 90.  However it started to decrease again, second liter given.  Labs show mild leukocytosis of 11.  Lactic mildly elevated at 2.2.  However without fever or source, low suspicion for sepsis.  Patient does have an AKI with a creatinine of 2.52, BUN elevated 99, indicating dehydration.  Dimer greater than 20.  Unfortunately, patient is not a candidate for CTA due to his poor kidney function.  Will order VQ scan.  Patient will need to be admitted for rehydration, VQ scan, and continued monitoring of his symptoms.  Discussed findings and plan with patient, who is agreeable.  Patient's nephew reports patient has taken a bottle of Tylenol over the past week to 2 for back pain.  It was not an attempt to self-harm.  Will add on Tylenol level.  Discussed with Dr. Lupita Leash from [facility], patient to be admitted.  Of note, patient did have a PET scan 02/2019 which was concerning for bronchogenic carcinoma.    Final Clinical Impression(s) / ED Diagnoses Final diagnoses:  Positive D dimer  Generalized weakness  AKI (acute kidney injury) ([place])  Dehydration    Rx / DC Orders ED Discharge Orders    None       Franchot Heidelberg, PA-C 02/11/20 1442    Davonna Belling, MD 02/11/20 1457

## 2020-02-11 NOTE — ED Triage Notes (Addendum)
Pt arrived via walk in from home, c/o SOB and increasing generalized weakness and no appetite x2 weeks. No neuro deficit. Denies any CP, n/v or diarrhea. Denies any sick contacts.   On 2L [place] at baseline

## 2020-02-11 NOTE — ED Notes (Signed)
Sophia Caccavale, PA aware pt BP 67/32.

## 2020-02-12 ENCOUNTER — Inpatient Hospital Stay (HOSPITAL_COMMUNITY): Payer: Medicare Other

## 2020-02-12 ENCOUNTER — Encounter (HOSPITAL_COMMUNITY): Payer: Medicare Other

## 2020-02-12 DIAGNOSIS — G8929 Other chronic pain: Secondary | ICD-10-CM

## 2020-02-12 DIAGNOSIS — L8990 Pressure ulcer of unspecified site, unspecified stage: Secondary | ICD-10-CM | POA: Insufficient documentation

## 2020-02-12 DIAGNOSIS — M545 Low back pain: Secondary | ICD-10-CM

## 2020-02-12 LAB — LACTIC ACID, PLASMA: Lactic Acid, Venous: 1.3 mmol/L (ref 0.5–1.9)

## 2020-02-12 LAB — COMPREHENSIVE METABOLIC PANEL
ALT: 23 U/L (ref 0–44)
AST: 37 U/L (ref 15–41)
Albumin: 3.3 g/dL — ABNORMAL LOW (ref 3.5–5.0)
Alkaline Phosphatase: 101 U/L (ref 38–126)
Anion gap: 8 (ref 5–15)
BUN: 71 mg/dL — ABNORMAL HIGH (ref 8–23)
CO2: 30 mmol/L (ref 22–32)
Calcium: 10.8 mg/dL — ABNORMAL HIGH (ref 8.9–10.3)
Chloride: 105 mmol/L (ref 98–111)
Creatinine, Ser: 1.5 mg/dL — ABNORMAL HIGH (ref 0.61–1.24)
GFR calc Af Amer: 52 mL/min — ABNORMAL LOW (ref >60)
GFR calc non Af Amer: 45 mL/min — ABNORMAL LOW (ref >60)
Glucose, Bld: 107 mg/dL — ABNORMAL HIGH (ref 70–99)
Potassium: 5 mmol/L (ref 3.5–5.1)
Sodium: 143 mmol/L (ref 135–145)
Total Bilirubin: 0.5 mg/dL (ref 0.3–1.2)
Total Protein: 6.8 g/dL (ref 6.5–8.1)

## 2020-02-12 LAB — CBC
HCT: 38.2 % — ABNORMAL LOW (ref 39.0–52.0)
Hemoglobin: 11.8 g/dL — ABNORMAL LOW (ref 13.0–17.0)
MCH: 29.9 pg (ref 26.0–34.0)
MCHC: 30.9 g/dL (ref 30.0–36.0)
MCV: 96.7 fL (ref 80.0–100.0)
Platelets: 286 10*3/uL (ref 150–400)
RBC: 3.95 MIL/uL — ABNORMAL LOW (ref 4.22–5.81)
RDW: 13.1 % (ref 11.5–15.5)
WBC: 7.8 10*3/uL (ref 4.0–10.5)
nRBC: 0 % (ref 0.0–0.2)

## 2020-02-12 LAB — URINE CULTURE: Culture: <10000 — AB

## 2020-02-12 LAB — OSMOLALITY, URINE: Osmolality, Ur: 487 mOsm/kg (ref 300–900)

## 2020-02-12 IMAGING — MR MR THORACIC SPINE W/O CM
6 of 7 series · 33 of 48 positions shown · non-contrast
Comparison: Prior MRI of the lumbar spine from [DATE].

CLINICAL DATA: Initial evaluation for mid back pain.

EXAM:
MRI THORACIC SPINE WITHOUT CONTRAST
TECHNIQUE: Multiplanar, multisequence MR imaging of the thoracic spine was
performed. No intravenous contrast was administered.

[Series 20: T1 · sagittal · 4.0mm · 1.72mm/px · 3 of 13 slices shown (1 of 3)]
[im 1/13]
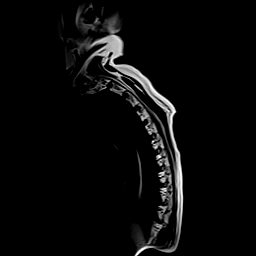
[im 7/13]
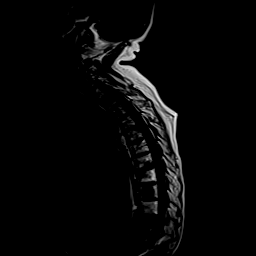
[im 13/13]
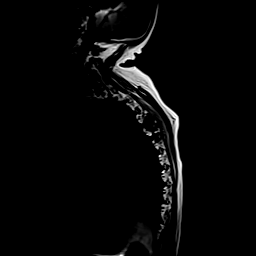

[Series 21: STIR · sagittal · 3.0mm · 0.94mm/px · 4 of 19 slices shown]
[im 1/19]
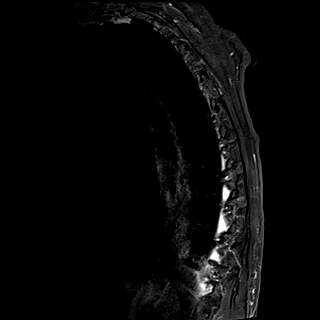
[im 5/19]
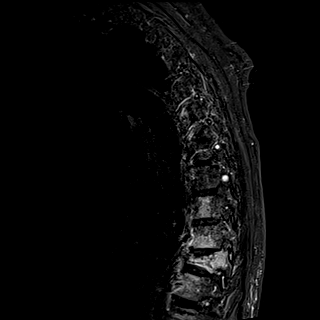
[im 10/19]
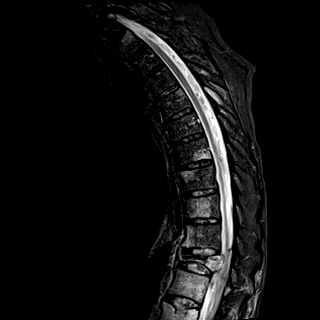
[im 14/19]
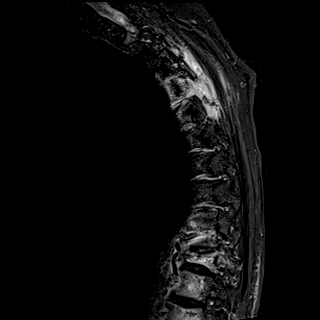

[Series 22: T1 · sagittal · 3.0mm · 0.94mm/px · 5 of 19 slices shown (2 of 3)]
[im 1/19]
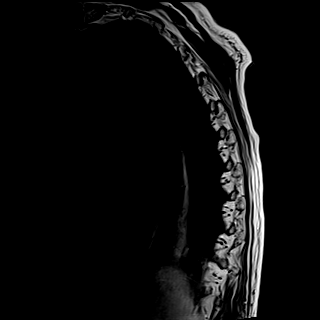
[im 5/19]
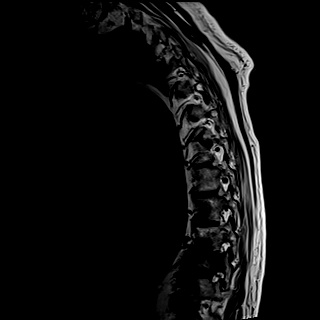
[im 10/19]
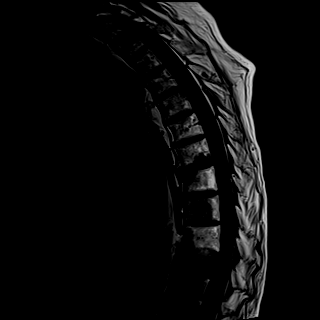
[im 14/19]
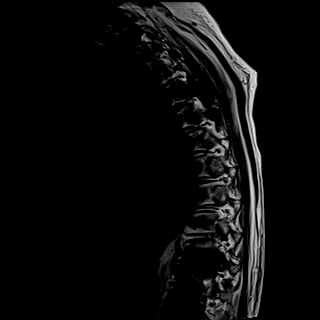
[im 19/19]
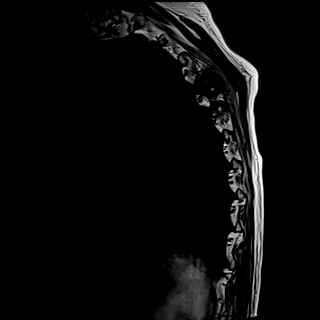

[Series 23: T2 · sagittal · 3.0mm · 0.83mm/px · 5 of 19 slices shown (1 of 2)]
[im 1/19]
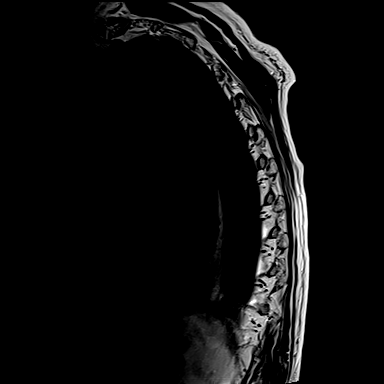
[im 5/19]
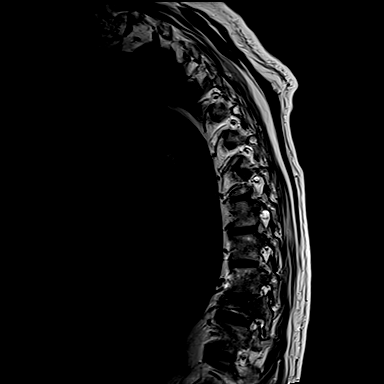
[im 10/19]
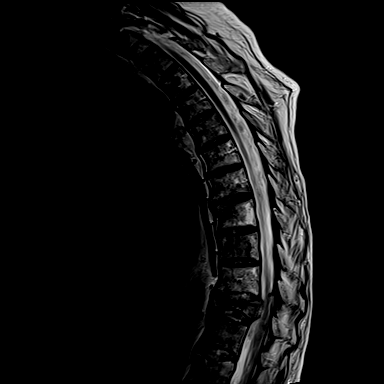
[im 14/19]
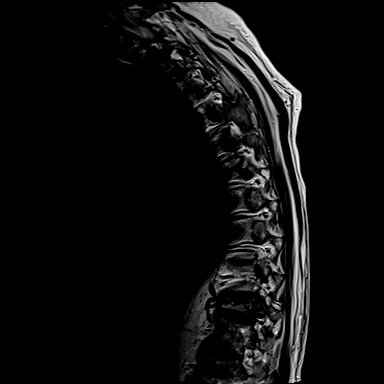
[im 19/19]
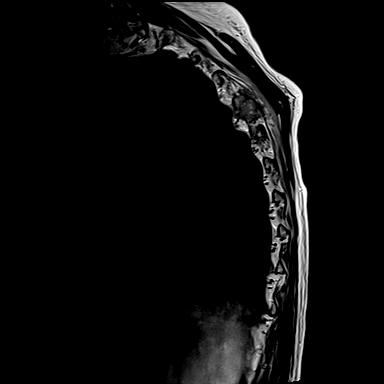

[Series 24: T2 · axial · 4.0mm · 0.78mm/px · z∈[-324,-4]mm · 8 of 39 slices shown (2 of 2)]
[im 1/39]
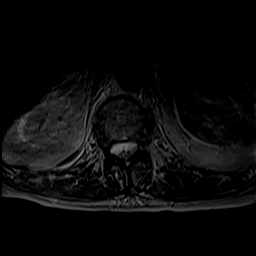
[im 5/39]
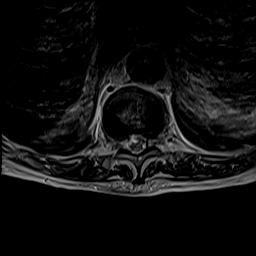
[im 13/39]
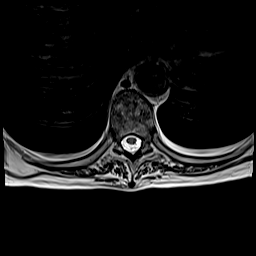
[im 17/39]
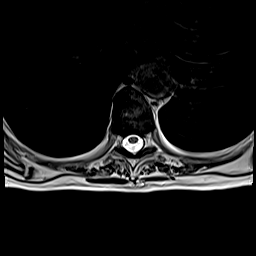
[im 22/39]
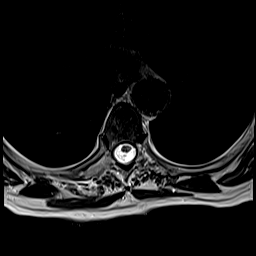
[im 26/39]
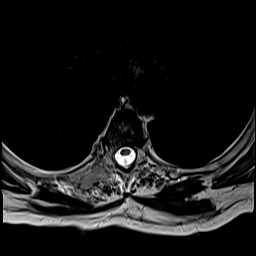
[im 34/39]
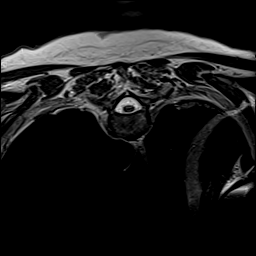
[im 39/39]
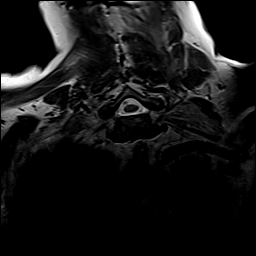

[Series 26: T1 · axial · 4.0mm · 0.39mm/px · z∈[-324,-4]mm · 8 of 39 slices shown (3 of 3)]
[im 1/39]
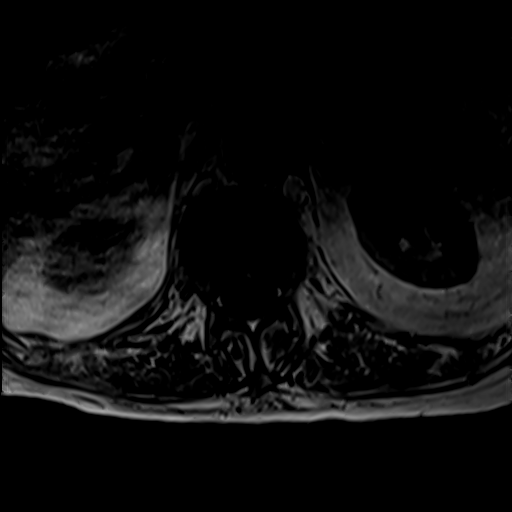
[im 5/39]
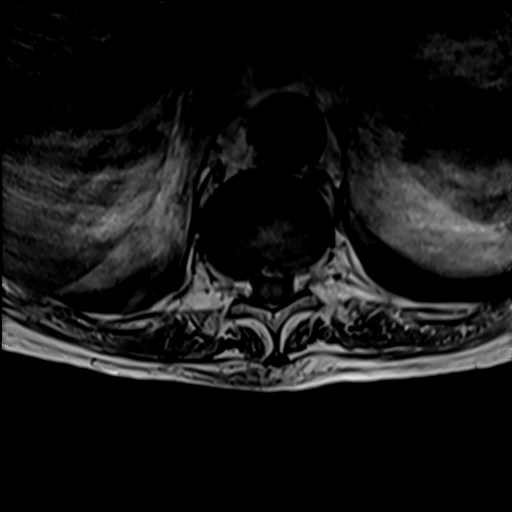
[im 13/39]
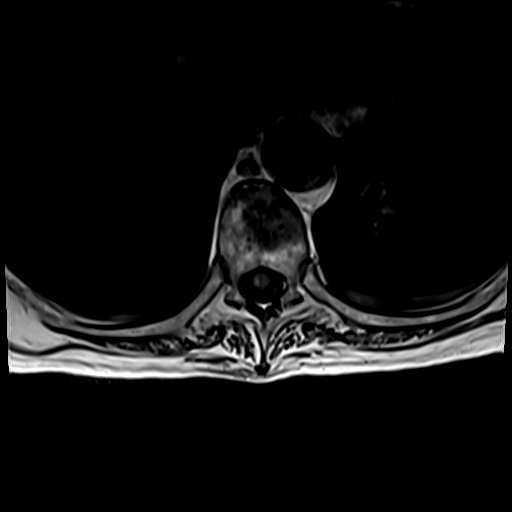
[im 17/39]
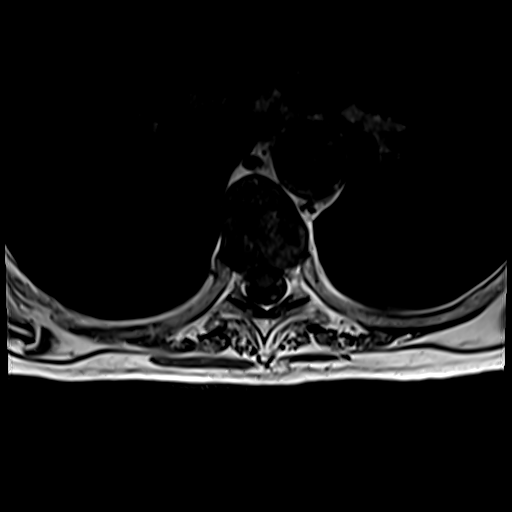
[im 22/39]
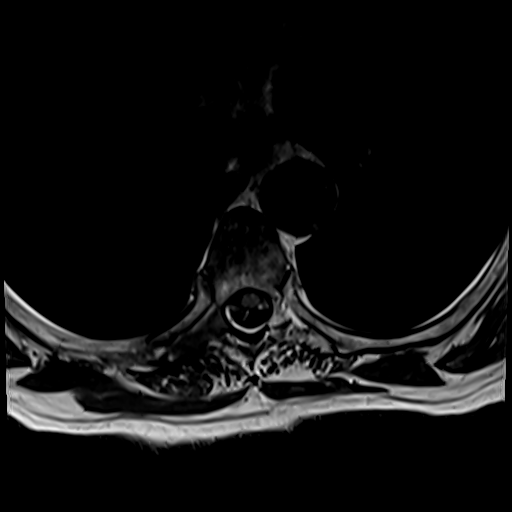
[im 26/39]
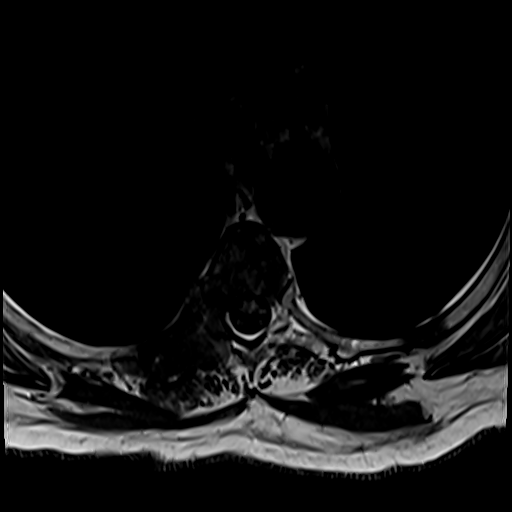
[im 34/39]
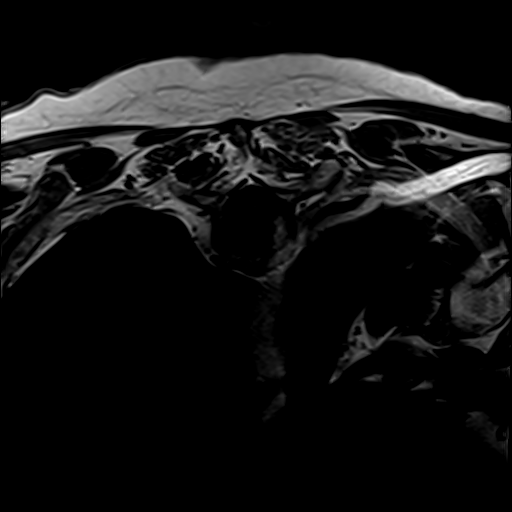
[im 39/39]
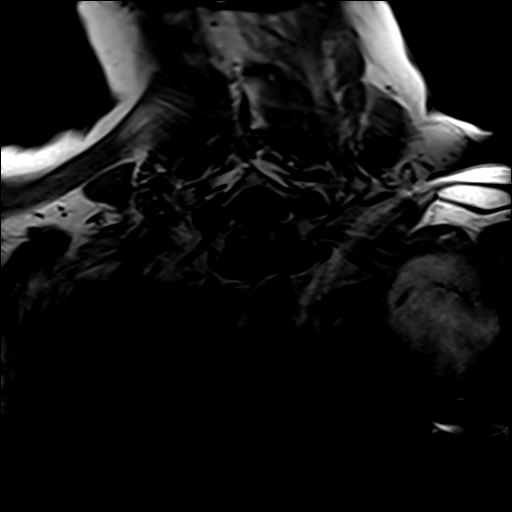

[33 of 48 positions shown; findings below may reference images not displayed]

FINDINGS: Alignment: Exaggeration of the normal thoracic kyphosis with
underlying mild dextroscoliosis. No listhesis.

Vertebrae: Abnormal marrow signal intensity consistent with
metastatic disease or possibly myeloma seen throughout the thoracic
spine, with involvement of nearly all levels. Involvement most
pronounced within the mid and lower thoracic spine extending from T7
inferiorly. Complete involvement of the T11 vertebral body with
associated pathologic compression fracture, with height loss
measuring up to 75%. 5 mm of retropulsed bone and/or tumor protrudes
into the spinal canal. Resultant mild spinal stenosis with minimal
flattening of the ventral spinal cord, but no frank cord compression
or cord signal changes.

Probable additional compression fracture noted extending through the
superior endplate of L1, partially visualized, better evaluated on
previous lumbar MRI. No other pathologic fracture identified.
Prominent tumor involvement of the right posterior elements noted at
T5 (series 25, image 8). No other significant extra osseous or
epidural tumor identified. Probable scattered rib involvement noted
as well.

Cord: Signal intensity within the thoracic spinal cord is within
normal limits.

Paraspinal and other soft tissues: Soft tissue edema noted within
the right upper posterior back at the level of T4-5 related to
prominent tumor burden involving the adjacent right T5 posterior
elements (series 21, image 5). Paraspinous soft tissues demonstrate
no other acute finding. Trace layering bilateral pleural effusions
noted.

Disc levels:

No significant disc pathology seen within the thoracic spine. No
other significant canal or foraminal stenosis at this time.
IMPRESSION: 1. Abnormal marrow signal intensity throughout the thoracic spine,
consistent with metastatic disease or myeloma.
2. Complete involvement of the T11 vertebral body with associated
pathologic compression fracture, with 5 mm of retropulsed bone
and/or tumor protruding into the spinal canal. Resultant mild spinal
stenosis without frank cord compression or cord signal changes.
3. Additional prominent tumor involvement involving the right
posterior elements of T5 with associated adjacent paraspinous edema.
4. No other significant epidural or extraosseous tumor identified.
No significant disc pathology or stenosis.

## 2020-02-12 IMAGING — CT CT CHEST W/O CM
2 of 4 series · 11 of 36 positions shown, 13 images · non-contrast
Comparison: CT chest [DATE].

CLINICAL DATA: Metastatic disease of unknown primary.

EXAM:
CT CHEST, ABDOMEN AND PELVIS WITHOUT CONTRAST
TECHNIQUE: Multidetector CT imaging of the chest, abdomen and pelvis was
performed following the standard protocol without IV contrast.

[Series 2: cap w/o · axial · non-contrast · 0.84mm/px · z∈[+229,+774]mm · 8 of 135 slices shown, 10 images]
[im 13/135  mediastinal]
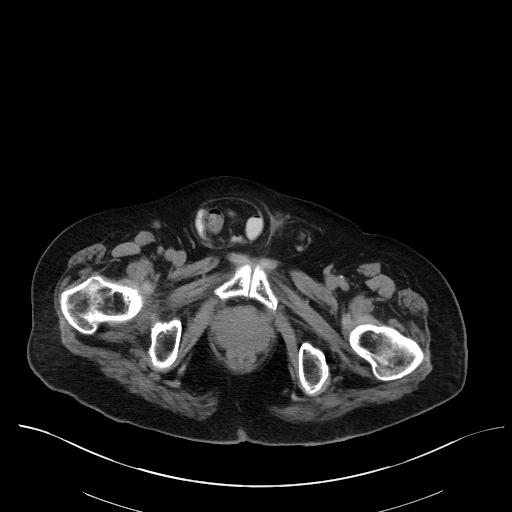
[im 13/135  lung]
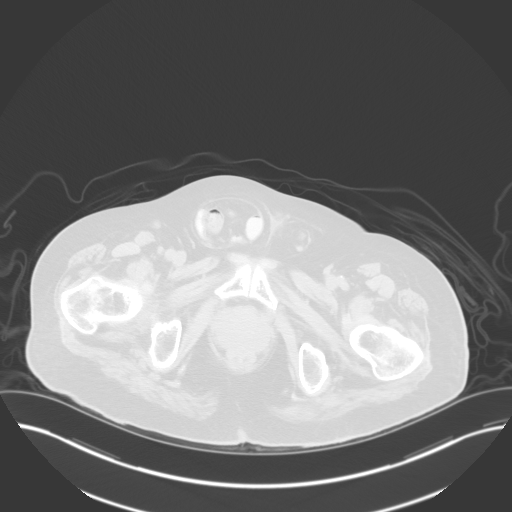
[im 25/135  lung]
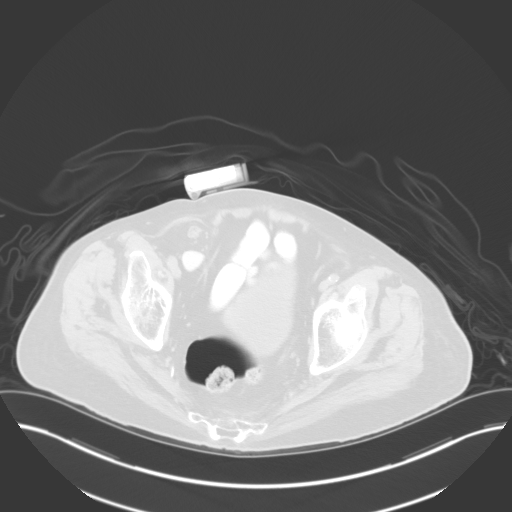
[im 49/135  lung]
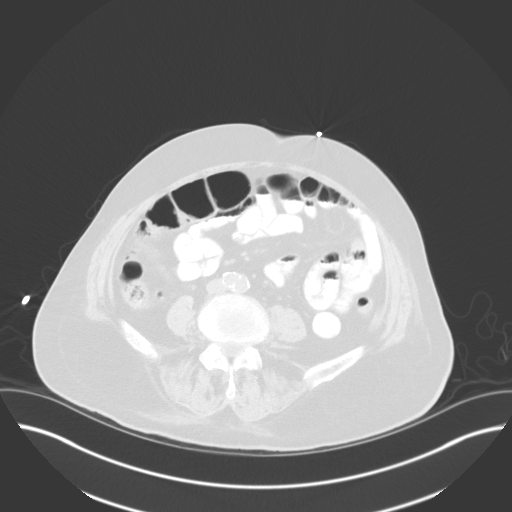
[im 61/135  lung]
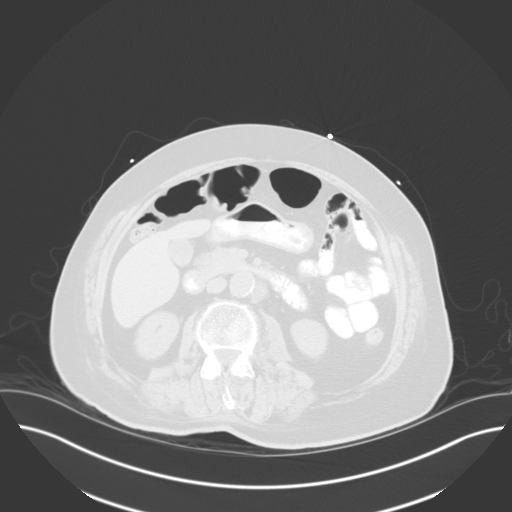
[im 74/135  mediastinal]
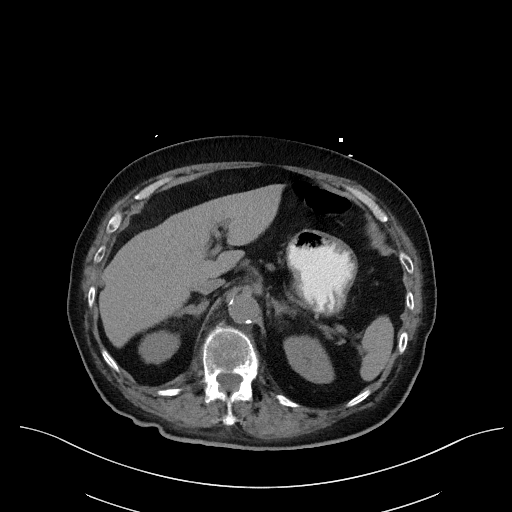
[im 74/135  lung]
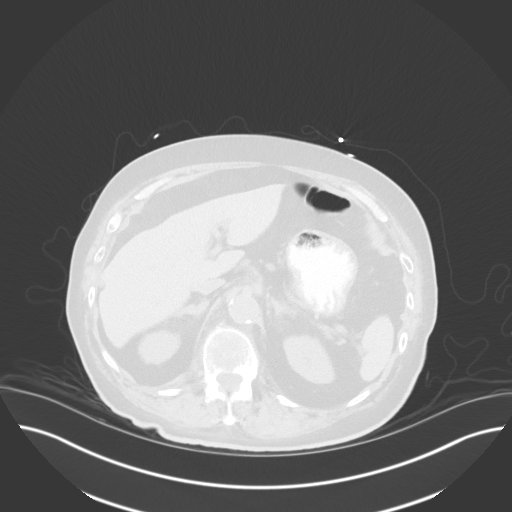
[im 86/135  lung]
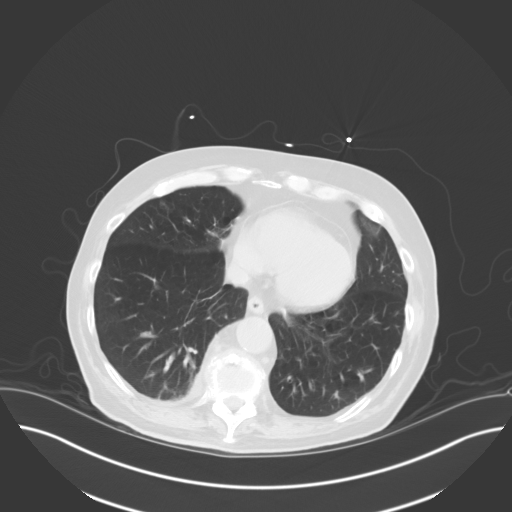
[im 110/135  lung]
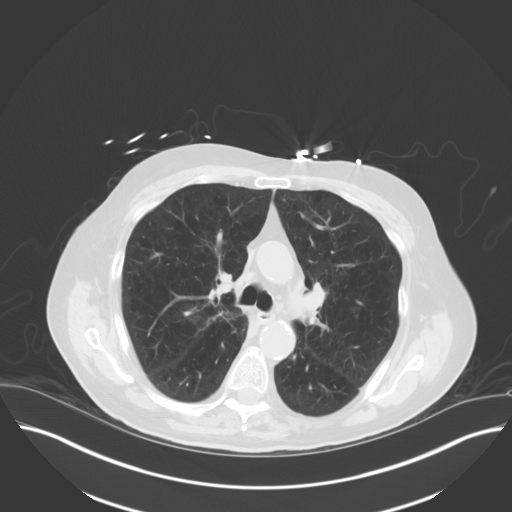
[im 122/135  lung]
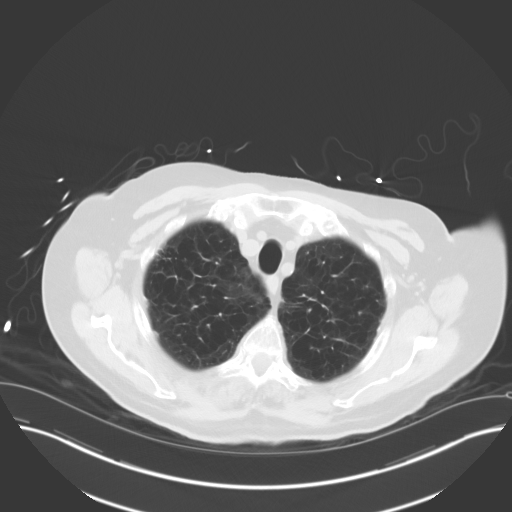

[Series 5: coronals · coronal · 0.70mm/px · 3 of 142 slices shown]
[im 29/142  lung]
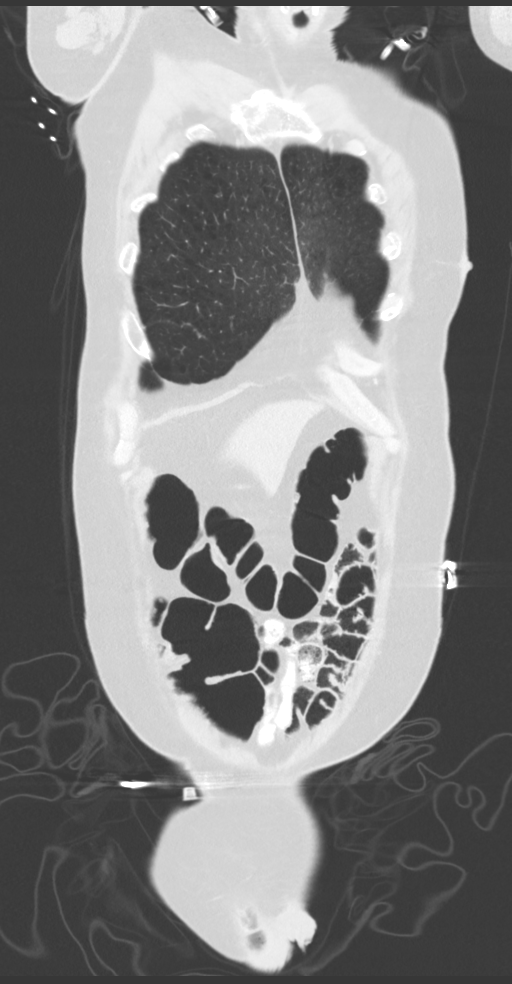
[im 57/142  lung]
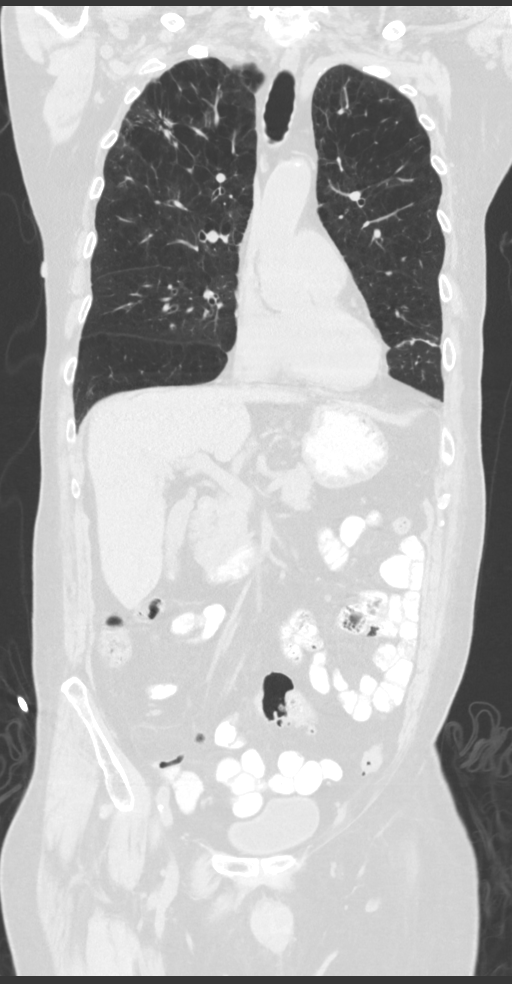
[im 85/142  lung]
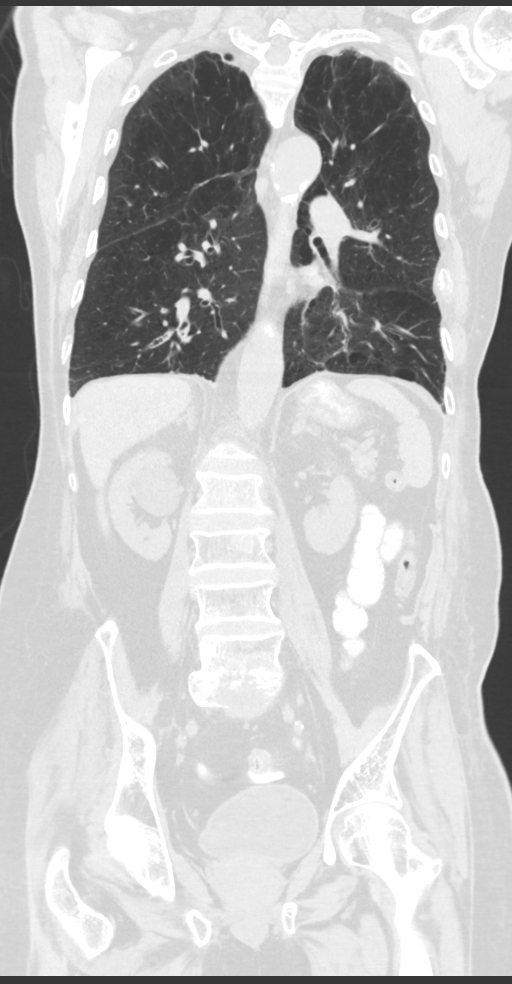

[11 of 36 positions shown; findings below may reference images not displayed]

FINDINGS: CT CHEST FINDINGS

Cardiovascular: The heart size is normal. No substantial pericardial
effusion. Coronary artery calcification is evident. Atherosclerotic
calcification is noted in the wall of the thoracic aorta.

Mediastinum/Nodes: No mediastinal lymphadenopathy. No evidence for
gross hilar lymphadenopathy although assessment is limited by the
lack of intravenous contrast on today's study. The esophagus has
normal imaging features. There is no axillary lymphadenopathy.

Lungs/Pleura: Centrilobular and paraseptal emphysema evident.
Bullous change noted right lung apex with biapical pleuroparenchymal
scarring. 10 mm spiculated right upper lobe pulmonary nodule
identified on image 43/series 4. 5 mm posterior right upper lobe
nodule evident on 58/4. Scattered areas of architectural distortion
and parenchymal scarring noted. Circumferential bronchial wall
thickening evident bilaterally with substantial peripheral airway
impaction in the posterior right lower lobe. Atelectasis noted
posterior costophrenic sulci.

Musculoskeletal: Lytic lesions are identified in the manubrium
measuring up to 13 mm (sagittal image 96/series 6). T11 compression
fracture evident. 4.4 x 3.4 cm soft tissue mass destroys the right
T5 pedicle and transverse process with involvement of the posterior
right fifth rib.

CT ABDOMEN PELVIS FINDINGS

Hepatobiliary: Not well demonstrated on this noncontrast study,
suspicion for a 1.8 cm lesion in the dome of the lateral segment
left liver (narrow windows on axial 56/2). Small area of low
attenuation in the anterior liver, adjacent to the falciform
ligament, is in a characteristic location for focal fatty
deposition. There is no evidence for gallstones, gallbladder wall
thickening, or pericholecystic fluid. No intrahepatic or
extrahepatic biliary dilation.

Pancreas: No focal mass lesion. No dilatation of the main duct. No
intraparenchymal cyst. No peripancreatic edema.

Spleen: No splenomegaly. No focal mass lesion.

Adrenals/Urinary Tract: No adrenal nodule or mass. Left kidney has
unremarkable noncontrast appearance. Cystic lesion in the upper pole
right kidney likely focally dilated upper pole calyx although
central sinus cysts could have this appearance. No evidence for
hydroureter. The urinary bladder appears normal for the degree of
distention.

Stomach/Bowel: Stomach is unremarkable. No gastric wall thickening.
No evidence of outlet obstruction. Duodenum is normally positioned
as is the ligament of Treitz. No small bowel wall thickening. No
small bowel dilatation. The terminal ileum is normal. The appendix
is normal. No gross colonic mass. No colonic wall thickening.
Diverticular changes are noted in the left colon without evidence of
diverticulitis.

Vascular/Lymphatic: Saccular aneurysm identified infrarenal
abdominal aorta measuring up to 3.2 cm in maximum diameter. There is
no gastrohepatic or hepatoduodenal ligament lymphadenopathy. No
retroperitoneal or mesenteric lymphadenopathy. No pelvic sidewall
lymphadenopathy.

Reproductive: Prostate gland is enlarged.

Other: No intraperitoneal free fluid.

Musculoskeletal: Right groin hernia contains distal ileal loops
without obstruction or complicating features. Destructive tumor
identified at S1, better characterized on recent MRI. Additional
bony metastatic disease also better assessed at MRI.
IMPRESSION: 1. 10 mm spiculated right upper lobe pulmonary nodule. Primary
bronchogenic neoplasm versus metastatic deposit.
2. 5 mm posterior right upper lobe pulmonary nodule. Metastatic
disease not excluded. Close attention on follow-up recommended.
3. Circumferential bronchial wall thickening with substantial
peripheral airway impaction in the posterior right lower lobe.
Imaging features may be related to atypical infection although
aspiration could have this appearance in the appropriate clinical
setting.
4. 1.8 cm suspected lesion in the dome of the lateral segment left
liver, not well evaluated on this noncontrast exam metastatic
disease a concern.
5. Metastatic disease identified in the manubrium and bulky soft
tissue metastatic deposit involves the right T5 pedicle, transverse
process and posterior right fifth rib.
6. Metastatic disease in the lumbosacral spine better characterized
on recent MRI.
7. Right groin hernia contains distal ileal loops without
obstruction or complicating features.
8. Saccular infrarenal abdominal aortic aneurysm measuring up to
cm in maximum diameter. Attention on follow-up recommended.
9. Prostatomegaly.
10. Aortic Atherosclerosis ([C2]-[C2]) and Emphysema
([C2]-[C2]).

## 2020-02-12 IMAGING — CT CT ABD-PELV W/O CM
2 of 4 series · 11 of 36 positions shown, 13 images · non-contrast
Comparison: CT chest [DATE].

CLINICAL DATA: Metastatic disease of unknown primary.

EXAM:
CT CHEST, ABDOMEN AND PELVIS WITHOUT CONTRAST
TECHNIQUE: Multidetector CT imaging of the chest, abdomen and pelvis was
performed following the standard protocol without IV contrast.

[Series 2: cap w/o · axial · non-contrast · 0.84mm/px · z∈[+229,+774]mm · 8 of 135 slices shown, 10 images]
[im 13/135  mediastinal]
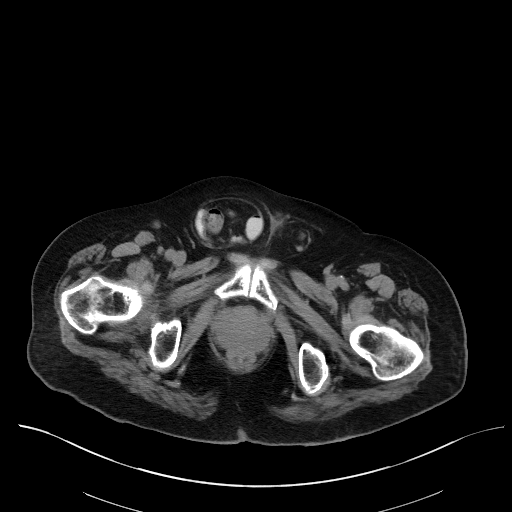
[im 13/135  lung]
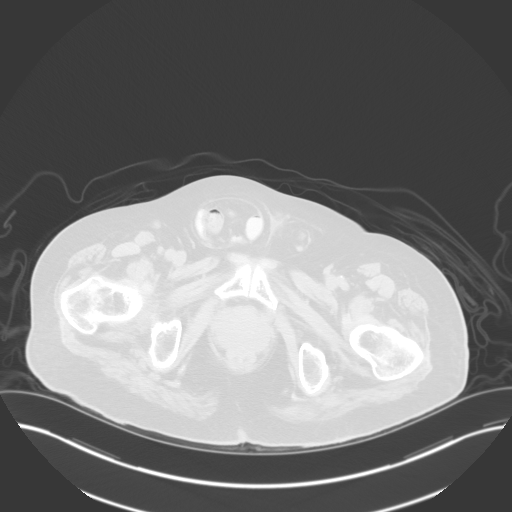
[im 25/135  lung]
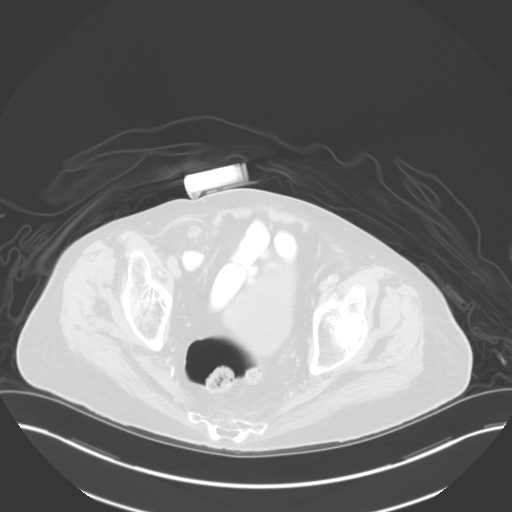
[im 49/135  lung]
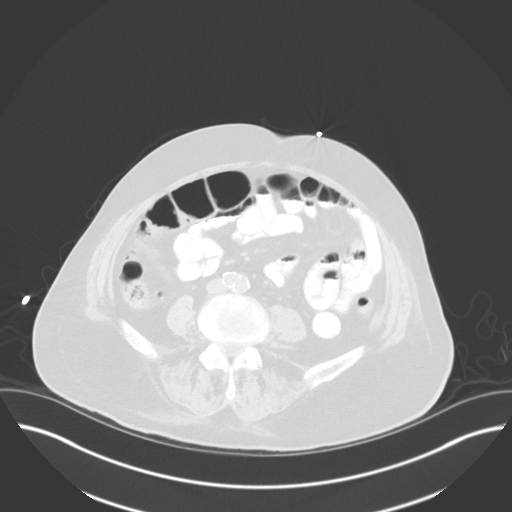
[im 61/135  lung]
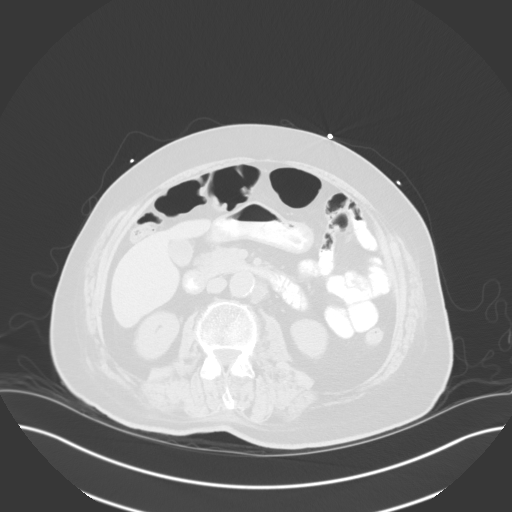
[im 74/135  mediastinal]
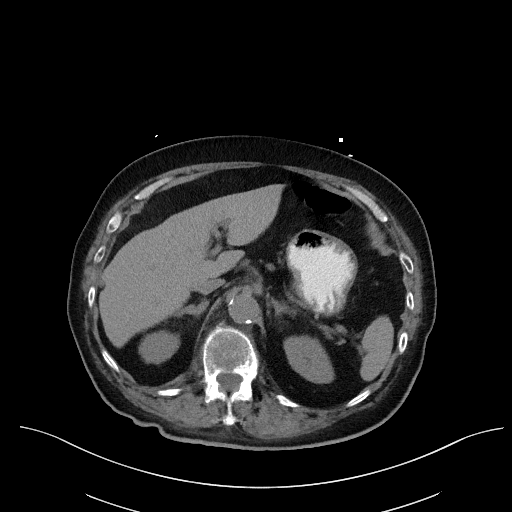
[im 74/135  lung]
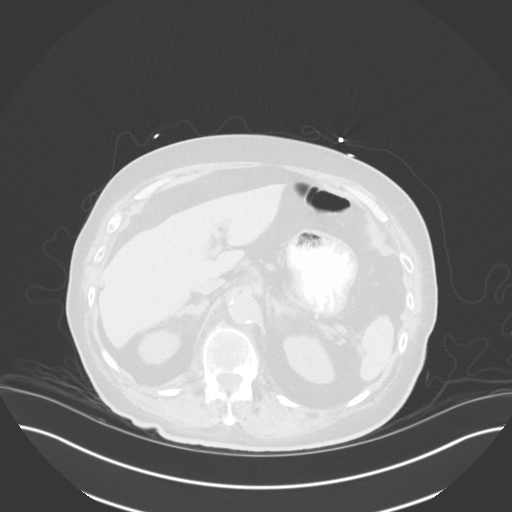
[im 86/135  lung]
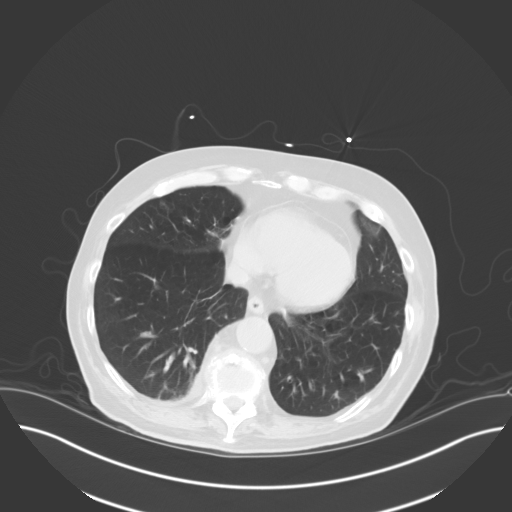
[im 110/135  lung]
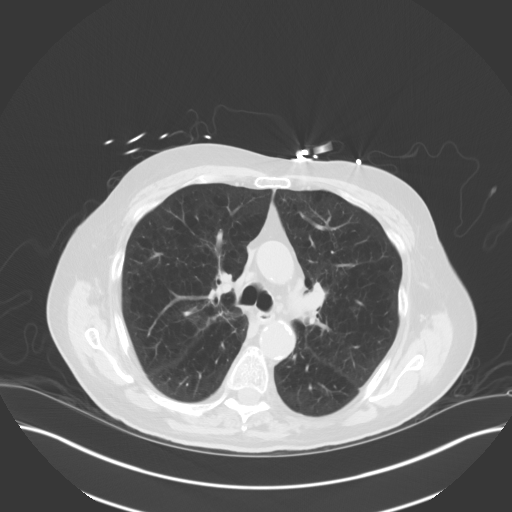
[im 122/135  lung]
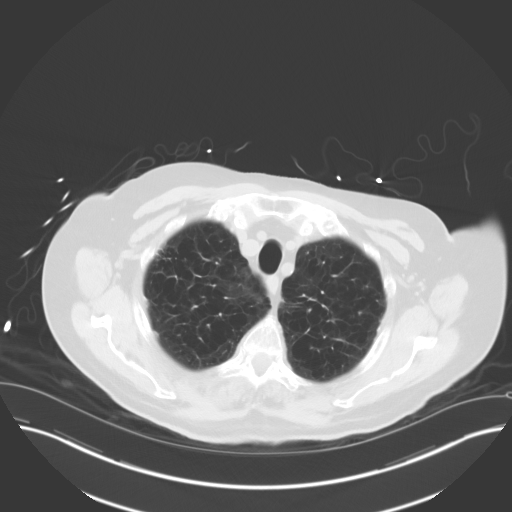

[Series 5: coronals · coronal · 0.70mm/px · 3 of 142 slices shown]
[im 29/142  lung]
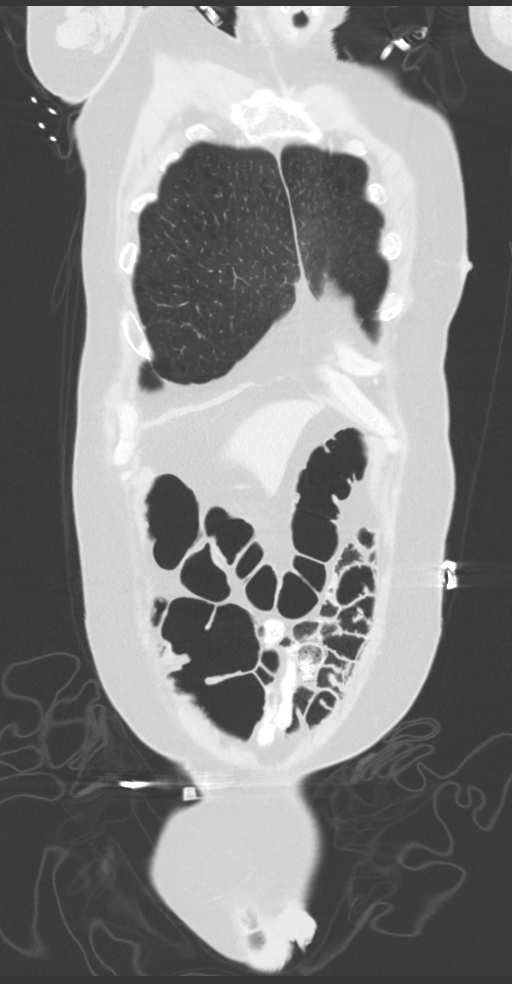
[im 57/142  lung]
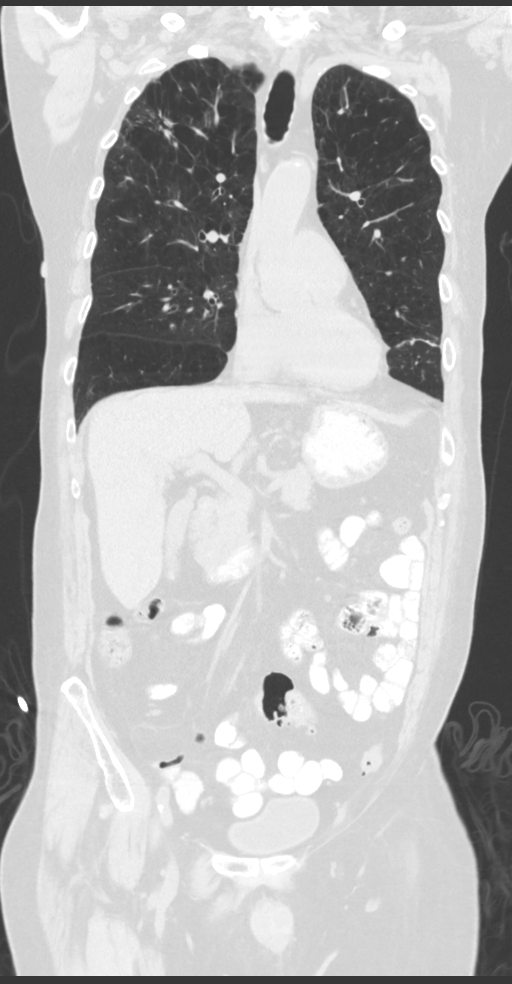
[im 85/142  lung]
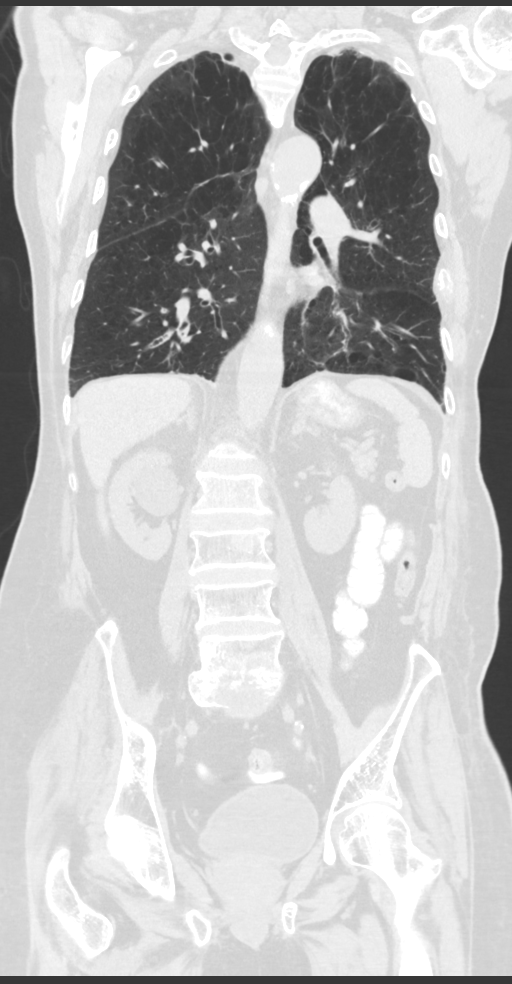

[11 of 36 positions shown; findings below may reference images not displayed]

FINDINGS: CT CHEST FINDINGS

Cardiovascular: The heart size is normal. No substantial pericardial
effusion. Coronary artery calcification is evident. Atherosclerotic
calcification is noted in the wall of the thoracic aorta.

Mediastinum/Nodes: No mediastinal lymphadenopathy. No evidence for
gross hilar lymphadenopathy although assessment is limited by the
lack of intravenous contrast on today's study. The esophagus has
normal imaging features. There is no axillary lymphadenopathy.

Lungs/Pleura: Centrilobular and paraseptal emphysema evident.
Bullous change noted right lung apex with biapical pleuroparenchymal
scarring. 10 mm spiculated right upper lobe pulmonary nodule
identified on image 43/series 4. 5 mm posterior right upper lobe
nodule evident on 58/4. Scattered areas of architectural distortion
and parenchymal scarring noted. Circumferential bronchial wall
thickening evident bilaterally with substantial peripheral airway
impaction in the posterior right lower lobe. Atelectasis noted
posterior costophrenic sulci.

Musculoskeletal: Lytic lesions are identified in the manubrium
measuring up to 13 mm (sagittal image 96/series 6). T11 compression
fracture evident. 4.4 x 3.4 cm soft tissue mass destroys the right
T5 pedicle and transverse process with involvement of the posterior
right fifth rib.

CT ABDOMEN PELVIS FINDINGS

Hepatobiliary: Not well demonstrated on this noncontrast study,
suspicion for a 1.8 cm lesion in the dome of the lateral segment
left liver (narrow windows on axial 56/2). Small area of low
attenuation in the anterior liver, adjacent to the falciform
ligament, is in a characteristic location for focal fatty
deposition. There is no evidence for gallstones, gallbladder wall
thickening, or pericholecystic fluid. No intrahepatic or
extrahepatic biliary dilation.

Pancreas: No focal mass lesion. No dilatation of the main duct. No
intraparenchymal cyst. No peripancreatic edema.

Spleen: No splenomegaly. No focal mass lesion.

Adrenals/Urinary Tract: No adrenal nodule or mass. Left kidney has
unremarkable noncontrast appearance. Cystic lesion in the upper pole
right kidney likely focally dilated upper pole calyx although
central sinus cysts could have this appearance. No evidence for
hydroureter. The urinary bladder appears normal for the degree of
distention.

Stomach/Bowel: Stomach is unremarkable. No gastric wall thickening.
No evidence of outlet obstruction. Duodenum is normally positioned
as is the ligament of Treitz. No small bowel wall thickening. No
small bowel dilatation. The terminal ileum is normal. The appendix
is normal. No gross colonic mass. No colonic wall thickening.
Diverticular changes are noted in the left colon without evidence of
diverticulitis.

Vascular/Lymphatic: Saccular aneurysm identified infrarenal
abdominal aorta measuring up to 3.2 cm in maximum diameter. There is
no gastrohepatic or hepatoduodenal ligament lymphadenopathy. No
retroperitoneal or mesenteric lymphadenopathy. No pelvic sidewall
lymphadenopathy.

Reproductive: Prostate gland is enlarged.

Other: No intraperitoneal free fluid.

Musculoskeletal: Right groin hernia contains distal ileal loops
without obstruction or complicating features. Destructive tumor
identified at S1, better characterized on recent MRI. Additional
bony metastatic disease also better assessed at MRI.
IMPRESSION: 1. 10 mm spiculated right upper lobe pulmonary nodule. Primary
bronchogenic neoplasm versus metastatic deposit.
2. 5 mm posterior right upper lobe pulmonary nodule. Metastatic
disease not excluded. Close attention on follow-up recommended.
3. Circumferential bronchial wall thickening with substantial
peripheral airway impaction in the posterior right lower lobe.
Imaging features may be related to atypical infection although
aspiration could have this appearance in the appropriate clinical
setting.
4. 1.8 cm suspected lesion in the dome of the lateral segment left
liver, not well evaluated on this noncontrast exam metastatic
disease a concern.
5. Metastatic disease identified in the manubrium and bulky soft
tissue metastatic deposit involves the right T5 pedicle, transverse
process and posterior right fifth rib.
6. Metastatic disease in the lumbosacral spine better characterized
on recent MRI.
7. Right groin hernia contains distal ileal loops without
obstruction or complicating features.
8. Saccular infrarenal abdominal aortic aneurysm measuring up to
cm in maximum diameter. Attention on follow-up recommended.
9. Prostatomegaly.
10. Aortic Atherosclerosis ([C2]-[C2]) and Emphysema
([C2]-[C2]).

## 2020-02-12 MED ORDER — ENOXAPARIN SODIUM 40 MG/0.4ML ~~LOC~~ SOLN
40.0000 mg | SUBCUTANEOUS | Status: DC
  Administered 2020-02-12 – 2020-02-22 (×10): 40 mg via SUBCUTANEOUS
  Filled 2020-02-12 (×11): qty 0.4

## 2020-02-12 MED ORDER — IOHEXOL 9 MG/ML PO SOLN
500.0000 mL | ORAL | Status: AC
  Administered 2020-02-12 (×2): 500 mL via ORAL

## 2020-02-12 NOTE — Progress Notes (Signed)
PT Cancellation Note  Patient Details Name: Anothony Bursch MRN: [id number] DOB: 12-30-1945   Cancelled Treatment:    Reason Eval/Treat Not Completed: Medical issues which prohibited therapy (noted MRI showed metastatic tumor T11-S1. Will hold PT until further medical guidance given on mobility.)   Philomena Doheny PT 02/12/2020  [facility] Services Pager [phone] Office [phone]

## 2020-02-12 NOTE — Consult Note (Signed)
WOC Nurse Consult Note: Reason for Consult:Nonblanchable erythema to sacrum.  Reports increased back pain, leg weakness and immobility.  Will offload pressure and observe.  Wound type: darkly erythematous unclear if early DTI or stage 1 pressure injury, due to presence of erythema after several hours, concerned for deeper injury.  Pressure Injury POA: Yes Measurement: 5 cm x 3 cm intact ruborous nonblanchable area.  Wound bed:NA Drainage (amount, consistency, odor) NA Periwound:intact Dressing procedure/placement/frequency: Offload pressure by turning and repositioning every two hours avoiding disposable briefs and underpads.  Will not follow at this time.  Please re-consult if needed.  Domenic Moras MSN, RN, FNP-BC [facility] Wound, Ostomy, Continence Nurse Pager [phone]

## 2020-02-12 NOTE — Progress Notes (Addendum)
PROGRESS NOTE    Owin Vignola  KZS:[id number] DOB: Aug 19, 1945 DOA: 02/11/2020 PCP: Patient, No Pcp Per    Chief Complaint  Patient presents with  . Weakness  . Shortness of Breath    Brief Narrative: 74 year old male with prior history of COPD on home oxygen, lung nodule, hypertension, chronic diastolic heart failure presents to ED for generalized weakness and decreased appetite.  Arrival to the ED patient was found to be in AKI, hypercalcemia, elevated lactic acid, D-dimer greater than 20.  Urine analysis was negative for infection.  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 Complete spinal imaging recommended. He was referred to [facility] for admission.   Assessment & Plan:   Principal Problem:   Hypotension Active Problems:   COPD, group D, by GOLD 2017 classification ([place])   Chronic respiratory failure with hypoxia (HCC)   Hypertension   Hyperlipidemia   Solitary pulmonary nodule- R middle lobe- followed by pulm   Back pain   AKI (acute kidney injury) ([place])   Pressure injury of skin   Generalized weakness, back pain and decreased appetite Probably secondary to metastatic disease vs myeloma.  Further evaluation of the spine ordered.  CT chest and abdomen and pelvis ordered.  PSA ordered.     Hypercalcemia;  Probably from malignancy vs dehydration.  Hydrate and repeat calcium in am.  Check PTH.    Essential hypertension  Hypotension earlier this am.  Resolved with fluids.  bp parameters wnl.     AKI:  Probably sec to dehydration , poor oral intake and poor renal perfusion from hypotension  Improving with IV fluids.    Hyperlipidemia: resume statin.   Pressure Injury 02/11/20 Sacrum Left;Right Stage 1 -  Intact skin with non-blanchable redness of a localized  area usually over a bony prominence. (Active)  02/11/20 1816  Location: Sacrum  Location Orientation: Left;Right  Staging: Stage 1 -  Intact skin with non-blanchable redness of a localized area usually over a bony prominence.  Wound Description (Comments):   Present on Admission: Yes   Wound care consulted and recommendations given.    Mild anemia:  Monitor hemoglobin.     Elevated lactic acid:  Resolved with IV fluids.    Chronic respiratory failure with hypoxia 2 L of nasal cannula oxygen. No wheezing heard on exam continue to monitor.    Solitary pulmonary nodule Get CT of the chest without contrast for further evaluation    Discussed the results of the MRI of the lumbar spine with the patient's daughter she wanted to talk to the rest of the family members and the patient and decide whether to pursue aggressive route vs pain control. She would like  to talk to palliative care for goals of care.     DVT prophylaxis: Lovenox.  Code Status: DNR. Family Communication: discussed with daughter over the phone Disposition:   Status is: Inpatient  Remains inpatient appropriate because:Ongoing diagnostic testing needed not appropriate for outpatient work up   Dispo: The patient is from: Home              Anticipated d/c is to: pending.               Anticipated d/c date is: > 3 days  Patient currently is not medically stable to d/c.       Consultants:   Palliative care.   Procedures:  MRI Lumbar spine.  Antimicrobials: none.   Subjective: Reports back pain.   Objective: Vitals:   02/11/20 2212 02/12/20 0629 02/12/20 0803 02/12/20 1157  BP: (!) 129/84 (!) 137/91  (!) 133/80  Pulse: 93 94  89  Resp: 16 16  16   Temp: 98.4 F (36.9 C) 97.9 F (36.6 C)  97.8 F (36.6 C)  TempSrc: Oral Oral  Oral  SpO2: 100% 97% 97% 97%  Weight:      Height:        Intake/Output Summary (Last 24 hours) at 02/12/2020 1508 Last data filed at 02/12/2020  1055 Gross per 24 hour  Intake --  Output 1500 ml  Net -1500 ml   Filed Weights   02/11/20 1151  Weight: 78.5 kg    Examination:  General exam: Appears calm and comfortable  Respiratory system: Clear to auscultation. Respiratory effort normal. Cardiovascular system: S1 & S2 heard, RRR. No JVD,  No pedal edema. Gastrointestinal system: Abdomen is nondistended, soft and nontender. Normal bowel sounds heard. Central nervous system: Alert and oriented. No focal neurological deficits. Extremities: Symmetric 5 x 5 power. Skin: stage 1 sacral decubitus ulcer . Psychiatry: Mood & affect appropriate.     Data Reviewed: I have personally reviewed following labs and imaging studies  CBC: Recent Labs  Lab 02/11/20 1249 02/12/20 0528  WBC 11.1* 7.8  NEUTROABS 9.1*  --   HGB 14.0 11.8*  HCT 44.5 38.2*  MCV 94.5 96.7  PLT 381 254    Basic Metabolic Panel: Recent Labs  Lab 02/11/20 1249 02/12/20 0528  NA 141 143  K 4.6 5.0  CL 96* 105  CO2 25 30  GLUCOSE 150* 107*  BUN 99* 71*  CREATININE 2.52* 1.50*  CALCIUM 11.8* 10.8*    GFR: Estimated Creatinine Clearance: 47.4 mL/min (A) (by C-G formula based on SCr of 1.5 mg/dL (H)).  Liver Function Tests: Recent Labs  Lab 02/11/20 1249 02/12/20 0528  AST 41 37  ALT 25 23  ALKPHOS 115 101  BILITOT 0.8 0.5  PROT 8.0 6.8  ALBUMIN 3.7 3.3*    CBG: No results for input(s): GLUCAP in the last 168 hours.   Recent Results (from the past 240 hour(s))  SARS Coronavirus 2 by RT PCR (hospital order, performed in [facility] hospital lab) Nasopharyngeal Nasopharyngeal Swab     Status: None   Collection Time: 02/11/20  1:24 PM   Specimen: Nasopharyngeal Swab  Result Value Ref Range Status   SARS Coronavirus 2 NEGATIVE NEGATIVE Final    Comment: (NOTE) SARS-CoV-2 target nucleic acids are NOT DETECTED.  The SARS-CoV-2 RNA is generally detectable in upper and lower respiratory specimens during the acute phase of infection.  The lowest concentration of SARS-CoV-2 viral copies this assay can detect is 250 copies / mL. A negative result does not preclude SARS-CoV-2 infection and should not be used as the sole basis for treatment or other patient management decisions.  A negative result may occur with improper specimen collection / handling, submission of specimen other than nasopharyngeal swab, presence of viral mutation(s) within the areas targeted by this assay, and inadequate number of viral copies (<250 copies / mL). A negative result must be combined with clinical observations, patient history, and epidemiological information.  Fact Sheet for Patients:   StrictlyIdeas.no  Fact Sheet for Healthcare Providers: BankingDealers.co.za  This test is not  yet approved or  cleared by the [place] and has been authorized for detection and/or diagnosis of SARS-CoV-2 by FDA under an Emergency Use Authorization (EUA).  This EUA will remain in effect (meaning this test can be used) for the duration of the COVID-19 declaration under Section 564(b)(1) of the Act, 21 U.S.C. section 360bbb-3(b)(1), unless the authorization is terminated or revoked sooner.  Performed at [facility], [place] [address] [place], [place] [address]          Radiology Studies: DG Chest 2 View  Result Date: 02/11/2020 CLINICAL DATA:  Shortness of breath. EXAM: CHEST - 2 VIEW COMPARISON:  August 08, 2018. FINDINGS: The heart size and mediastinal contours are within normal limits. Both lungs are clear. No pneumothorax or pleural effusion is noted. The visualized skeletal structures are unremarkable. IMPRESSION: No active cardiopulmonary disease. Aortic Atherosclerosis (ICD10-I70.0). Electronically Signed   By: Marijo Conception M.D.   On: 02/11/2020 12:24   MR LUMBAR SPINE WO CONTRAST  Result Date: 02/11/2020 CLINICAL DATA:  Low back pain with difficulty walking,  persistently worsening. EXAM: MRI LUMBAR SPINE WITHOUT CONTRAST TECHNIQUE: Multiplanar, multisequence MR imaging of the lumbar spine was performed. No intravenous contrast was administered. COMPARISON:  None. FINDINGS: Segmentation:  5 lumbar type vertebral bodies. Alignment:  Normal Vertebrae: Metastatic disease or myeloma throughout the region with involvement each vertebral level from inferior T11 through S1. Extensive confluent disease replacing much of the marrow. Minor superior endplate deformity at L1 which could be at a fracture related to the underlying tumor or could be chronic. Extraosseous tumor extension at the S1 level involving the ventral epidural space. Lesion also seen within the right iliac bone. At the barely visible inferior T11 level, it appears that tumor bulges into the anterior aspect of the spinal canal. This was not studied completely. There is some potential that this could affect the distal thoracic cord. Conus medullaris and cauda equina: Conus extends to the T12-L1 level. Conus and cauda equina appear normal. Paraspinal and other soft tissues: Negative. No visible retroperitoneal adenopathy. Question focal aneurysm of the infrarenal abdominal aorta measuring up to 3.5 cm. This was not fully evaluated. Disc levels: No disc pathology at L2-3 or above. L3-4: Mild bulging of the disc.  No compressive stenosis. L5-S1: Chronic fusion across this disc space. Sufficient patency of the canal and foramina. As noted above, there is ventral epidural bulging of tumor at the S1 level but this does not cause visible neural compression. L4-5: Disc bulge. Mild facet and ligamentous hypertrophy. Mild bilateral lateral recess stenosis. 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 Bulging of tumor into the ventral spinal canal at the inferior T11 level, barely visible on this study. Complete spinal imaging recommended. Bulging  of tumor at the S1 level, encroaching upon the ventral epidural space but without gross neural compression. Electronically Signed   By: Nelson Chimes M.D.   On: 02/11/2020 19:36   NM Pulmonary Perfusion  Result Date: 02/11/2020 CLINICAL DATA:  PE suspected, high prob Shortness of breath.  Weakness. EXAM: NUCLEAR MEDICINE PERFUSION LUNG SCAN TECHNIQUE: Perfusion images were obtained in multiple projections after intravenous injection of radiopharmaceutical. Ventilation scans intentionally deferred if perfusion scan and chest x-ray adequate for interpretation during COVID 19 epidemic. RADIOPHARMACEUTICALS:  3.8 mCi Tc-25m MAA IV COMPARISON:  Chest radiograph earlier this day. FINDINGS: Lung perfusion is diffusely heterogeneous. There are no discrete wedge-shaped perfusion defects. IMPRESSION:  Diffusely heterogeneous lung perfusion without discrete wedge-shaped perfusion defect. No scintigraphic evidence of pulmonary embolus. Electronically Signed   By: Keith Rake M.D.   On: 02/11/2020 16:50   US RENAL  Result Date: 02/11/2020 CLINICAL DATA:  Acute kidney injury EXAM: RENAL / URINARY TRACT ULTRASOUND COMPLETE COMPARISON:  Ultrasound 09/18/2017 FINDINGS: Right Kidney: Renal measurements: 9.1 by 5 x 5.2 cm = volume: 125 mL. Cortical echogenicity within normal limits. No hydronephrosis. 4.2 x 2.9 x 4.2 cm parapelvic cyst, previously 4.3 cm. Left Kidney: Renal measurements: 8.8 x 4.6 x 4.7 cm = volume: 100 mL. Echogenicity within normal limits. No mass or hydronephrosis visualized. Bladder: Appears normal for degree of bladder distention. Other: Bladder volume of 473 mL. Patient unable to void to get postvoid residual. IMPRESSION: 1. Negative for hydronephrosis. 2. Similar 4.3 cm right parapelvic cyst Electronically Signed   By: Donavan Foil M.D.   On: 02/11/2020 16:30        Scheduled Meds: . atorvastatin  80 mg Oral q1800  . enoxaparin (LOVENOX) injection  40 mg Subcutaneous Q24H  . fluticasone  furoate-vilanterol  1 puff Inhalation Daily   And  . umeclidinium bromide  1 puff Inhalation Daily   Continuous Infusions: . sodium chloride 125 mL/hr at 02/12/20 1256     LOS: 1 day        Hosie Poisson, MD [facility]   To contact the attending provider between 7A-7P or the covering provider during after hours 7P-7A, please log into the web site [email or web address] and access using universal [place] password for that web site. If you do not have the password, please call the hospital operator.  02/12/2020, 3:08 PM

## 2020-02-13 ENCOUNTER — Inpatient Hospital Stay (HOSPITAL_COMMUNITY): Payer: Medicare Other

## 2020-02-13 DIAGNOSIS — R7989 Other specified abnormal findings of blood chemistry: Secondary | ICD-10-CM

## 2020-02-13 DIAGNOSIS — G893 Neoplasm related pain (acute) (chronic): Secondary | ICD-10-CM

## 2020-02-13 LAB — BASIC METABOLIC PANEL
Anion gap: 16 — ABNORMAL HIGH (ref 5–15)
BUN: 32 mg/dL — ABNORMAL HIGH (ref 8–23)
CO2: 24 mmol/L (ref 22–32)
Calcium: 10.3 mg/dL (ref 8.9–10.3)
Chloride: 100 mmol/L (ref 98–111)
Creatinine, Ser: 1.07 mg/dL (ref 0.61–1.24)
GFR calc Af Amer: >60 mL/min (ref >60)
GFR calc non Af Amer: >60 mL/min (ref >60)
Glucose, Bld: 105 mg/dL — ABNORMAL HIGH (ref 70–99)
Potassium: 4.5 mmol/L (ref 3.5–5.1)
Sodium: 140 mmol/L (ref 135–145)

## 2020-02-13 LAB — CBC
HCT: 38.9 % — ABNORMAL LOW (ref 39.0–52.0)
Hemoglobin: 12.2 g/dL — ABNORMAL LOW (ref 13.0–17.0)
MCH: 30 pg (ref 26.0–34.0)
MCHC: 31.4 g/dL (ref 30.0–36.0)
MCV: 95.6 fL (ref 80.0–100.0)
Platelets: 298 10*3/uL (ref 150–400)
RBC: 4.07 MIL/uL — ABNORMAL LOW (ref 4.22–5.81)
RDW: 13 % (ref 11.5–15.5)
WBC: 9.8 10*3/uL (ref 4.0–10.5)
nRBC: 0 % (ref 0.0–0.2)

## 2020-02-13 MED ORDER — HYDROMORPHONE HCL 1 MG/ML IJ SOLN
0.5000 mg | INTRAMUSCULAR | Status: DC | PRN
  Filled 2020-02-13: qty 0.5

## 2020-02-13 MED ORDER — DEXAMETHASONE SODIUM PHOSPHATE 10 MG/ML IJ SOLN
20.0000 mg | Freq: Once | INTRAMUSCULAR | Status: AC
  Administered 2020-02-13: 20 mg via INTRAVENOUS
  Filled 2020-02-13: qty 2

## 2020-02-13 NOTE — Care Management Important Message (Signed)
Important Message  Patient Details IM Letter given to the Patient Name: Tristan Proto MRN: [id number] Date of Birth: April 28, 1946   Medicare Important Message Given:  Yes     Kerin Salen 02/13/2020, 1:15 PM

## 2020-02-13 NOTE — Progress Notes (Signed)
Lower extremity venous has been completed.   Preliminary results in CV Proc.   Abram Sander 02/13/2020 2:51 PM

## 2020-02-13 NOTE — Progress Notes (Signed)
PROGRESS NOTE    George Frazier  WUJ:[id number] DOB: October 15, 1945 DOA: 02/11/2020 PCP: George Frazier, No Pcp Per    Chief Complaint  George Frazier presents with   Weakness   Shortness of Breath    Brief Narrative: 74 year old male with prior history of COPD on home oxygen, lung nodule, hypertension, chronic diastolic heart failure presents to ED for generalized weakness and decreased appetite.  Arrival to the ED George Frazier was found to be in AKI, hypercalcemia, elevated lactic acid, D-dimer greater than 20.  Urine analysis was negative for infection.  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 Complete spinal imaging recommended. He was referred to [facility] for admission.  Bilateral lower extremity duplex is negative for DVT.  CT of the chest without contrast and abdomen and pelvis shows 10 mm spiculated pulmonary nodule in the right upper lobe and a 5 mm nodule in the right upper lobe of the lungs. Reviewed the results with the oncologist on call Dr. Marin Olp who recommended IV Decadron 20 mg once followed by 10 mg twice daily until family decides regarding treatment versus hospice.  Meanwhile myeloma panel ordered for further evaluation.  Assessment & Plan:   Principal Problem:   Hypotension Active Problems:   COPD, group D, by GOLD 2017 classification ([place])   Chronic respiratory failure with hypoxia (HCC)   Hypertension   Hyperlipidemia   Solitary pulmonary nodule- R middle lobe- followed by pulm   Back pain   AKI (acute kidney injury) ([place])   Pressure injury of skin   Generalized weakness, back pain and decreased appetite Probably secondary to metastatic disease vs myeloma.  Myeloma panel ordered.  CT of the chest without contrast shows 2 pulmonary nodules , 10 mm and 5 mm nodule in size in the right  upper lobe of the lung suspicious for primary bronchogenic carcinoma. Pt has a 40 year history of smoking. Further evaluation of the spine ordered and the results reviewed with the George Frazier and her daughter. Discussed with on-call oncologist who recommended 20 mg of IV Decadron followed by 10 mg twice daily of IV Decadron while we wait for the family decision regarding aggressive management versus comfort approach. pSA ordered and pending.      Hypercalcemia;  Probably from malignancy vs dehydration.  Hydrate and repeat calcium level is 10.3 this morning. Check PTH, pending   Essential hypertension Blood pressure parameters are well controlled  Hypotension resolved with IV fluids.    AKI:  Probably sec to dehydration , poor oral intake and poor renal perfusion from hypotension  Improving with IV fluids.  Creatinine back to baseline today.  Creatinine is at 1.07.   Hyperlipidemia: resume statin.   Pressure Injury 02/11/20 Sacrum Left;Right Stage 1 -  Intact skin with non-blanchable redness of a localized area usually over a bony prominence. (Active)  02/11/20 1816  Location: Sacrum  Location Orientation: Left;Right  Staging: Stage 1 -  Intact skin with non-blanchable redness of a localized area usually over a bony prominence.  Wound Description (Comments):   Present on Admission: Yes   Wound care consulted and recommendations given.    Mild anemia:  Hemoglobin around 12 today.    Elevated lactic acid:  Resolved with IV fluids.    Chronic respiratory failure with hypoxia 2 L of nasal cannula oxygen. No wheezing  heard on exam continue to monitor.    Solitary pulmonary nodule 10 mm spiculated pulmonary nodule seen in the right upper lobe suspicious for primary bronchogenic carcinoma.    Discussed the results of the MRI of the lumbar spine with the George Frazier's daughter she wanted to talk to the rest of the family members and the George Frazier and decide whether to pursue  aggressive route vs pain control. She would like  to talk to palliative care for goals of care. Palliative care meeting meeting today and family would like to tell us their decision regarding aggressive management versus comfort measures in the morning.     DVT prophylaxis: Lovenox.  Code Status: DNR. Family Communication: discussed with daughter over the phone Disposition:   Status is: Inpatient  Remains inpatient appropriate because:Ongoing diagnostic testing needed not appropriate for outpatient work up   Dispo: The George Frazier is from: Home              Anticipated d/c is to: pending.               Anticipated d/c date is: > 3 days              George Frazier currently is not medically stable to d/c.       Consultants:   Palliative care.   Procedures:  MRI Lumbar spine.  Antimicrobials: none.   Subjective: George Frazier reports pain is better controlled with medications.  Objective: Vitals:   02/12/20 2155 02/13/20 0656 02/13/20 0925 02/13/20 1427  BP: (!) 138/88 (!) 143/107  (!) 151/94  Pulse: 100 102  101  Resp: 16 16  14   Temp: 98.5 F (36.9 C) 98.7 F (37.1 C)  98.6 F (37 C)  TempSrc: Oral   Oral  SpO2: 97% 96% 95% 96%  Weight:      Height:        Intake/Output Summary (Last 24 hours) at 02/13/2020 1755 Last data filed at 02/13/2020 1651 Gross per 24 hour  Intake 2134.7 ml  Output 2575 ml  Net -440.3 ml   Filed Weights   02/11/20 1151  Weight: 78.5 kg    Examination:  General exam: Alert and comfortable, not in distress Respiratory system: Clear to auscultation bilaterally, no wheezing or rhonchi Cardiovascular system: S1 and S2 heard, regular rate rhythm, no JVD,  Gastrointestinal system: Abdomen is soft, nontender, nondistended, bowel sounds normal Central nervous system: Alert and oriented  extremities: No cyanosis or clubbing Skin: No rashes seen Psychiatry: Mood is appropriate    Data Reviewed: I have personally reviewed following labs and  imaging studies  CBC: Recent Labs  Lab 02/11/20 1249 02/12/20 0528 02/13/20 1032  WBC 11.1* 7.8 9.8  NEUTROABS 9.1*  --   --   HGB 14.0 11.8* 12.2*  HCT 44.5 38.2* 38.9*  MCV 94.5 96.7 95.6  PLT 381 286 161    Basic Metabolic Panel: Recent Labs  Lab 02/11/20 1249 02/12/20 0528 02/13/20 1032  NA 141 143 140  K 4.6 5.0 4.5  CL 96* 105 100  CO2 25 30 24   GLUCOSE 150* 107* 105*  BUN 99* 71* 32*  CREATININE 2.52* 1.50* 1.07  CALCIUM 11.8* 10.8* 10.3    GFR: Estimated Creatinine Clearance: 66.5 mL/min (by C-G formula based on SCr of 1.07 mg/dL).  Liver Function Tests: Recent Labs  Lab 02/11/20 1249 02/12/20 0528  AST 41 37  ALT 25 23  ALKPHOS 115 101  BILITOT 0.8 0.5  PROT 8.0 6.8  ALBUMIN 3.7 3.3*  CBG: No results for input(s): GLUCAP in the last 168 hours.   Recent Results (from the past 240 hour(s))  SARS Coronavirus 2 by RT PCR (hospital order, performed in [facility] hospital lab) Nasopharyngeal Nasopharyngeal Swab     Status: None   Collection Time: 02/11/20  1:24 PM   Specimen: Nasopharyngeal Swab  Result Value Ref Range Status   SARS Coronavirus 2 NEGATIVE NEGATIVE Final    Comment: (NOTE) SARS-CoV-2 target nucleic acids are NOT DETECTED.  The SARS-CoV-2 RNA is generally detectable in upper and lower respiratory specimens during the acute phase of infection. The lowest concentration of SARS-CoV-2 viral copies this assay can detect is 250 copies / mL. A negative result does not preclude SARS-CoV-2 infection and should not be used as the sole basis for treatment or other George Frazier management decisions.  A negative result may occur with improper specimen collection / handling, submission of specimen other than nasopharyngeal swab, presence of viral mutation(s) within the areas targeted by this assay, and inadequate number of viral copies (<250 copies / mL). A negative result must be combined with clinical observations, George Frazier history, and  epidemiological information.  Fact Sheet for Patients:   StrictlyIdeas.no  Fact Sheet for Healthcare Providers: BankingDealers.co.za  This test is not yet approved or  cleared by the [place] FDA and has been authorized for detection and/or diagnosis of SARS-CoV-2 by FDA under an Emergency Use Authorization (EUA).  This EUA will remain in effect (meaning this test can be used) for the duration of the COVID-19 declaration under Section 564(b)(1) of the Act, 21 U.S.C. section 360bbb-3(b)(1), unless the authorization is terminated or revoked sooner.  Performed at [facility], [place] [address] [place], [place] [address]   Urine culture     Status: Abnormal   Collection Time: 02/11/20  8:30 PM   Specimen: Urine, Clean Catch  Result Value Ref Range Status   Specimen Description   Final    URINE, CLEAN CATCH Performed at [facility], [place] [address], [place], [place] [address]    Special Requests   Final    NONE Performed at [facility], [place] [address] [place], [place] [address]    Culture (A)  Final    <10,000 COLONIES/mL INSIGNIFICANT GROWTH Performed at [place] [address] [place], [place] [address]    Report Status 02/12/2020 FINAL  Final         Radiology Studies: CT ABDOMEN PELVIS WO CONTRAST  Result Date: 02/13/2020 CLINICAL DATA:  Metastatic disease of unknown primary. EXAM: CT CHEST, ABDOMEN AND PELVIS WITHOUT CONTRAST TECHNIQUE: Multidetector CT imaging of the chest, abdomen and pelvis was performed following the standard protocol without IV contrast. COMPARISON:  CT chest 02/19/2019. FINDINGS: CT CHEST FINDINGS Cardiovascular: The heart size is normal. No substantial pericardial effusion. Coronary artery calcification is evident. Atherosclerotic calcification is noted in the wall of the thoracic aorta. Mediastinum/Nodes: No mediastinal  lymphadenopathy. No evidence for gross hilar lymphadenopathy although assessment is limited by the lack of intravenous contrast on today's study. The esophagus has normal imaging features. There is no axillary lymphadenopathy. Lungs/Pleura: Centrilobular and paraseptal emphysema evident. Bullous change noted right lung apex with biapical pleuroparenchymal scarring. 10 mm spiculated right upper lobe pulmonary nodule identified on image 43/series 4. 5 mm posterior right upper lobe nodule evident on 58/4. Scattered areas of architectural distortion and parenchymal scarring noted. Circumferential bronchial wall thickening evident bilaterally with substantial peripheral airway impaction in the posterior  right lower lobe. Atelectasis noted posterior costophrenic sulci. Musculoskeletal: Lytic lesions are identified in the manubrium measuring up to 13 mm (sagittal image 96/series 6). T11 compression fracture evident. 4.4 x 3.4 cm soft tissue mass destroys the right T5 pedicle and transverse process with involvement of the posterior right fifth rib. CT ABDOMEN PELVIS FINDINGS Hepatobiliary: Not well demonstrated on this noncontrast study, suspicion for a 1.8 cm lesion in the dome of the lateral segment left liver (narrow windows on axial 56/2). Small area of low attenuation in the anterior liver, adjacent to the falciform ligament, is in a characteristic location for focal fatty deposition. There is no evidence for gallstones, gallbladder wall thickening, or pericholecystic fluid. No intrahepatic or extrahepatic biliary dilation. Pancreas: No focal mass lesion. No dilatation of the main duct. No intraparenchymal cyst. No peripancreatic edema. Spleen: No splenomegaly. No focal mass lesion. Adrenals/Urinary Tract: No adrenal nodule or mass. Left kidney has unremarkable noncontrast appearance. Cystic lesion in the upper pole right kidney likely focally dilated upper pole calyx although central sinus cysts could have this  appearance. No evidence for hydroureter. The urinary bladder appears normal for the degree of distention. Stomach/Bowel: Stomach is unremarkable. No gastric wall thickening. No evidence of outlet obstruction. Duodenum is normally positioned as is the ligament of Treitz. No small bowel wall thickening. No small bowel dilatation. The terminal ileum is normal. The appendix is normal. No gross colonic mass. No colonic wall thickening. Diverticular changes are noted in the left colon without evidence of diverticulitis. Vascular/Lymphatic: Saccular aneurysm identified infrarenal abdominal aorta measuring up to 3.2 cm in maximum diameter. There is no gastrohepatic or hepatoduodenal ligament lymphadenopathy. No retroperitoneal or mesenteric lymphadenopathy. No pelvic sidewall lymphadenopathy. Reproductive: Prostate gland is enlarged. Other: No intraperitoneal free fluid. Musculoskeletal: Right groin hernia contains distal ileal loops without obstruction or complicating features. Destructive tumor identified at S1, better characterized on recent MRI. Additional bony metastatic disease also better assessed at MRI. IMPRESSION: 1. 10 mm spiculated right upper lobe pulmonary nodule. Primary bronchogenic neoplasm versus metastatic deposit. 2. 5 mm posterior right upper lobe pulmonary nodule. Metastatic disease not excluded. Close attention on follow-up recommended. 3. Circumferential bronchial wall thickening with substantial peripheral airway impaction in the posterior right lower lobe. Imaging features may be related to atypical infection although aspiration could have this appearance in the appropriate clinical setting. 4. 1.8 cm suspected lesion in the dome of the lateral segment left liver, not well evaluated on this noncontrast exam metastatic disease a concern. 5. Metastatic disease identified in the manubrium and bulky soft tissue metastatic deposit involves the right T5 pedicle, transverse process and posterior right  fifth rib. 6. 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 7. Right groin hernia contains distal ileal loops without obstruction or complicating features. 8. Saccular infrarenal abdominal aortic aneurysm measuring up to 3.2 cm in maximum diameter. Attention on follow-up recommended. 9. Prostatomegaly. 10. Aortic Atherosclerosis (ICD10-I70.0) and Emphysema (ICD10-J43.9). Electronically Signed   By: Misty Stanley M.D.   On: 02/13/2020 08:11   CT CHEST WO CONTRAST  Result Date: 02/13/2020 CLINICAL DATA:  Metastatic disease of unknown primary. EXAM: CT CHEST, ABDOMEN AND PELVIS WITHOUT CONTRAST TECHNIQUE: Multidetector CT imaging of the chest, abdomen and pelvis was performed following the standard protocol without IV contrast. COMPARISON:  CT chest 02/19/2019. FINDINGS: CT CHEST FINDINGS Cardiovascular: The heart size is normal. No substantial pericardial effusion. Coronary artery calcification is evident. Atherosclerotic calcification is noted in the wall of the thoracic aorta.  Mediastinum/Nodes: No mediastinal lymphadenopathy. No evidence for gross hilar lymphadenopathy although assessment is limited by the lack of intravenous contrast on today's study. The esophagus has normal imaging features. There is no axillary lymphadenopathy. Lungs/Pleura: Centrilobular and paraseptal emphysema evident. Bullous change noted right lung apex with biapical pleuroparenchymal scarring. 10 mm spiculated right upper lobe pulmonary nodule identified on image 43/series 4. 5 mm posterior right upper lobe nodule evident on 58/4. Scattered areas of architectural distortion and parenchymal scarring noted. Circumferential bronchial wall thickening evident bilaterally with substantial peripheral airway impaction in the posterior right lower lobe. Atelectasis noted posterior costophrenic sulci. Musculoskeletal: Lytic lesions are identified in the manubrium measuring up to 13 mm (sagittal image 96/series  6). T11 compression fracture evident. 4.4 x 3.4 cm soft tissue mass destroys the right T5 pedicle and transverse process with involvement of the posterior right fifth rib. CT ABDOMEN PELVIS FINDINGS Hepatobiliary: Not well demonstrated on this noncontrast study, suspicion for a 1.8 cm lesion in the dome of the lateral segment left liver (narrow windows on axial 56/2). Small area of low attenuation in the anterior liver, adjacent to the falciform ligament, is in a characteristic location for focal fatty deposition. There is no evidence for gallstones, gallbladder wall thickening, or pericholecystic fluid. No intrahepatic or extrahepatic biliary dilation. Pancreas: No focal mass lesion. No dilatation of the main duct. No intraparenchymal cyst. No peripancreatic edema. Spleen: No splenomegaly. No focal mass lesion. Adrenals/Urinary Tract: No adrenal nodule or mass. Left kidney has unremarkable noncontrast appearance. Cystic lesion in the upper pole right kidney likely focally dilated upper pole calyx although central sinus cysts could have this appearance. No evidence for hydroureter. The urinary bladder appears normal for the degree of distention. Stomach/Bowel: Stomach is unremarkable. No gastric wall thickening. No evidence of outlet obstruction. Duodenum is normally positioned as is the ligament of Treitz. No small bowel wall thickening. No small bowel dilatation. The terminal ileum is normal. The appendix is normal. No gross colonic mass. No colonic wall thickening. Diverticular changes are noted in the left colon without evidence of diverticulitis. Vascular/Lymphatic: Saccular aneurysm identified infrarenal abdominal aorta measuring up to 3.2 cm in maximum diameter. There is no gastrohepatic or hepatoduodenal ligament lymphadenopathy. No retroperitoneal or mesenteric lymphadenopathy. No pelvic sidewall lymphadenopathy. Reproductive: Prostate gland is enlarged. Other: No intraperitoneal free fluid.  Musculoskeletal: Right groin hernia contains distal ileal loops without obstruction or complicating features. Destructive tumor identified at S1, better characterized on recent MRI. Additional bony metastatic disease also better assessed at MRI. IMPRESSION: 1. 10 mm spiculated right upper lobe pulmonary nodule. Primary bronchogenic neoplasm versus metastatic deposit. 2. 5 mm posterior right upper lobe pulmonary nodule. Metastatic disease not excluded. Close attention on follow-up recommended. 3. Circumferential bronchial wall thickening with substantial peripheral airway impaction in the posterior right lower lobe. Imaging features may be related to atypical infection although aspiration could have this appearance in the appropriate clinical setting. 4. 1.8 cm suspected lesion in the dome of the lateral segment left liver, not well evaluated on this noncontrast exam metastatic disease a concern. 5. Metastatic disease identified in the manubrium and bulky soft tissue metastatic deposit involves the right T5 pedicle, transverse process and posterior right fifth rib. 6. 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 7. Right groin hernia contains distal ileal loops without obstruction or complicating features. 8. Saccular infrarenal abdominal aortic aneurysm measuring up to 3.2 cm in maximum diameter. Attention on follow-up recommended. 9. Prostatomegaly. 10. Aortic Atherosclerosis (ICD10-I70.0) and  Emphysema (ICD10-J43.9). Electronically Signed   By: Misty Stanley M.D.   On: 02/13/2020 08:11   MR THORACIC SPINE WO CONTRAST  Result Date: 02/12/2020 CLINICAL DATA:  Initial evaluation for mid back pain. EXAM: MRI THORACIC SPINE WITHOUT CONTRAST TECHNIQUE: Multiplanar, multisequence MR imaging of the thoracic spine was performed. No intravenous contrast was administered. COMPARISON:  Prior MRI of the lumbar spine from 02/11/2020. FINDINGS: Alignment: Exaggeration of the normal thoracic  kyphosis with underlying mild dextroscoliosis. No listhesis. Vertebrae: Abnormal marrow signal intensity consistent with metastatic disease or possibly myeloma seen throughout the thoracic spine, with involvement of nearly all levels. Involvement most pronounced within the mid and lower thoracic spine extending from T7 inferiorly. Complete involvement of the T11 vertebral body with associated pathologic compression fracture, with height loss measuring up to 75%. 5 mm of retropulsed bone and/or tumor protrudes into the spinal canal. Resultant mild spinal stenosis with minimal flattening of the ventral spinal cord, but no frank cord compression or cord signal changes. Probable additional compression fracture noted extending through the superior endplate of L1, partially visualized, better evaluated on previous lumbar MRI. No other pathologic fracture identified. Prominent tumor involvement of the right posterior elements noted at T5 (series 25, image 8). No other significant extra osseous or epidural tumor identified. Probable scattered rib involvement noted as well. Cord: Signal intensity within the thoracic spinal cord is within normal limits. Paraspinal and other soft tissues: Soft tissue edema noted within the right upper posterior back at the level of T4-5 related to prominent tumor burden involving the adjacent right T5 posterior elements (series 21, image 5). Paraspinous soft tissues demonstrate no other acute finding. Trace layering bilateral pleural effusions noted. Disc levels: No significant disc pathology seen within the thoracic spine. No other significant canal or foraminal stenosis at this time. IMPRESSION: 1. Abnormal marrow signal intensity throughout the thoracic spine, consistent with metastatic disease or myeloma. 2. Complete involvement of the T11 vertebral body with associated pathologic compression fracture, with 5 mm of retropulsed bone and/or tumor protruding into the spinal canal. Resultant  mild spinal stenosis without frank cord compression or cord signal changes. 3. Additional prominent tumor involvement involving the right posterior elements of T5 with associated adjacent paraspinous edema. 4. No other significant epidural or extraosseous tumor identified. No significant disc pathology or stenosis. Electronically Signed   By: Jeannine Boga M.D.   On: 02/12/2020 22:52   MR LUMBAR SPINE WO CONTRAST  Result Date: 02/11/2020 CLINICAL DATA:  Low back pain with difficulty walking, persistently worsening. EXAM: MRI LUMBAR SPINE WITHOUT CONTRAST TECHNIQUE: Multiplanar, multisequence MR imaging of the lumbar spine was performed. No intravenous contrast was administered. COMPARISON:  None. FINDINGS: Segmentation:  5 lumbar type vertebral bodies. Alignment:  Normal Vertebrae: Metastatic disease or myeloma throughout the region with involvement each vertebral level from inferior T11 through S1. Extensive confluent disease replacing much of the marrow. Minor superior endplate deformity at L1 which could be at a fracture related to the underlying tumor or could be chronic. Extraosseous tumor extension at the S1 level involving the ventral epidural space. Lesion also seen within the right iliac bone. At the barely visible inferior T11 level, it appears that tumor bulges into the anterior aspect of the spinal canal. This was not studied completely. There is some potential that this could affect the distal thoracic cord. Conus medullaris and cauda equina: Conus extends to the T12-L1 level. Conus and cauda equina appear normal. Paraspinal and other soft tissues: Negative. No visible retroperitoneal  adenopathy. Question focal aneurysm of the infrarenal abdominal aorta measuring up to 3.5 cm. This was not fully evaluated. Disc levels: No disc pathology at L2-3 or above. L3-4: Mild bulging of the disc.  No compressive stenosis. L5-S1: Chronic fusion across this disc space. Sufficient patency of the canal and  foramina. As noted above, there is ventral epidural bulging of tumor at the S1 level but this does not cause visible neural compression. L4-5: Disc bulge. Mild facet and ligamentous hypertrophy. Mild bilateral lateral recess stenosis. 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 Bulging of tumor into the ventral spinal canal at the inferior T11 level, barely visible on this study. Complete spinal imaging recommended. Bulging of tumor at the S1 level, encroaching upon the ventral epidural space but without gross neural compression. Electronically Signed   By: Nelson Chimes M.D.   On: 02/11/2020 19:36   VAS [place] LOWER EXTREMITY VENOUS (DVT)  Result Date: 02/13/2020  Lower Venous DVTStudy Indications: Elevated ddimer.  Comparison Study: no prior Performing Technologist: Abram Sander RVS  Examination Guidelines: A complete evaluation includes B-mode imaging, spectral Doppler, color Doppler, and power Doppler as needed of all accessible portions of each vessel. Bilateral testing is considered an integral part of a complete examination. Limited examinations for reoccurring indications may be performed as noted. The reflux portion of the exam is performed with the George Frazier in reverse Trendelenburg.  +---------+---------------+---------+-----------+----------+--------------+  RIGHT     Compressibility Phasicity Spontaneity Properties Thrombus Aging  +---------+---------------+---------+-----------+----------+--------------+  CFV       Full            Yes       Yes                                    +---------+---------------+---------+-----------+----------+--------------+  SFJ       Full                                                             +---------+---------------+---------+-----------+----------+--------------+  FV Prox   Full                                                              +---------+---------------+---------+-----------+----------+--------------+  FV Mid    Full                                                             +---------+---------------+---------+-----------+----------+--------------+  FV Distal Full                                                             +---------+---------------+---------+-----------+----------+--------------+  PFV       Full                                                             +---------+---------------+---------+-----------+----------+--------------+  POP       Full            Yes       Yes                                    +---------+---------------+---------+-----------+----------+--------------+  PTV       Full                                                             +---------+---------------+---------+-----------+----------+--------------+  PERO      Full                                                             +---------+---------------+---------+-----------+----------+--------------+   +---------+---------------+---------+-----------+----------+--------------+  LEFT      Compressibility Phasicity Spontaneity Properties Thrombus Aging  +---------+---------------+---------+-----------+----------+--------------+  CFV       Full            Yes       Yes                                    +---------+---------------+---------+-----------+----------+--------------+  SFJ       Full                                                             +---------+---------------+---------+-----------+----------+--------------+  FV Prox   Full                                                             +---------+---------------+---------+-----------+----------+--------------+  FV Mid    Full                                                             +---------+---------------+---------+-----------+----------+--------------+  FV Distal Full                                                              +---------+---------------+---------+-----------+----------+--------------+  PFV       Full                                                             +---------+---------------+---------+-----------+----------+--------------+  POP       Full            Yes       Yes                                    +---------+---------------+---------+-----------+----------+--------------+  PTV       Full                                                             +---------+---------------+---------+-----------+----------+--------------+  PERO      Full                                                             +---------+---------------+---------+-----------+----------+--------------+     Summary: BILATERAL: - No evidence of deep vein thrombosis seen in the lower extremities, bilaterally. - No evidence of superficial venous thrombosis in the lower extremities, bilaterally. -   *See table(s) above for measurements and observations.    Preliminary         Scheduled Meds:  atorvastatin  80 mg Oral q1800   dexamethasone (DECADRON) injection  20 mg Intravenous Once   enoxaparin (LOVENOX) injection  40 mg Subcutaneous Q24H   fluticasone furoate-vilanterol  1 puff Inhalation Daily   And   umeclidinium bromide  1 puff Inhalation Daily   Continuous Infusions:  sodium chloride 125 mL/hr at 02/13/20 1058     LOS: 2 days        Hosie Poisson, MD [facility]   To contact the attending provider between 7A-7P or the covering provider during after hours 7P-7A, please log into the web site [email or web address] and access using universal [place] password for that web site. If you do not have the password, please call the hospital operator.  02/13/2020, 5:55 PM

## 2020-02-13 NOTE — Progress Notes (Signed)
PT Cancellation Note  Patient Details Name: George Frazier MRN: [id number] DOB: 07/03/1946   Cancelled Treatment:    Reason Eval/Treat Not Completed: Other (comment) (Per Dr. Karleen Hampshire, family would like to talk to palliative care, then decide course of tx. Will hold and follow.)  Philomena Doheny PT 02/13/2020  Acute Rehabilitation Services Pager [phone] Office [phone]

## 2020-02-14 DIAGNOSIS — C801 Malignant (primary) neoplasm, unspecified: Secondary | ICD-10-CM

## 2020-02-14 LAB — PSA, TOTAL AND FREE
PSA, Free Pct: 27.9 %
PSA, Free: 0.53 ng/mL
Prostate Specific Ag, Serum: 1.9 ng/mL (ref 0.0–4.0)

## 2020-02-14 LAB — PTH, INTACT AND CALCIUM
Calcium, Total (PTH): 10.5 mg/dL — ABNORMAL HIGH (ref 8.6–10.2)
PTH: 13 pg/mL — ABNORMAL LOW (ref 15–65)

## 2020-02-14 MED ORDER — ENSURE ENLIVE PO LIQD
237.0000 mL | Freq: Three times a day (TID) | ORAL | Status: DC
  Administered 2020-02-14 – 2020-02-23 (×16): 237 mL via ORAL

## 2020-02-14 MED ORDER — DEXAMETHASONE SODIUM PHOSPHATE 10 MG/ML IJ SOLN
10.0000 mg | Freq: Two times a day (BID) | INTRAMUSCULAR | Status: DC
  Administered 2020-02-14 – 2020-02-23 (×17): 10 mg via INTRAVENOUS
  Filled 2020-02-14 (×18): qty 1

## 2020-02-14 NOTE — Consult Note (Signed)
Palliative Care Consult  Reason for Consult: Goals of care in light of likely metastatic disease  Medicare consult received.  Chart reviewed including personal review of pertinent labs and imaging.  Discussed case with Dr. Karleen Hampshire.  Briefly, Mr. Riches is a 74 year old male with past medical history of COPD on home oxygen, lung nodule discovered in August 2020 (declined bronchoscopy at that time), hypertension, chronic diastolic heart failure who presented to the ED with generalized weakness and decreased appetite.  On arrival to ED he was found to have AKI, hypercalcemia and elevated lactic acid.  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  Follow-up MRI revealed that this also extended throughout thoracic spine as well.  Palliative care consulted to discuss goals of care.  I met today with Mr. Rosenberry, his daughter, and his nephew.  I introduced palliative care as specialized medical care for people living with serious illness. It focuses on providing relief from the symptoms and stress of a serious illness. The goal is to improve quality of life for both the patient and the family.  We discussed clinical course as well as wishes moving forward in regard to advanced directives.  Concepts specific to code status and rehospitalization discussed.  We discussed difference between a aggressive medical intervention path and a palliative, comfort focused care path.  Values and goals of care important to patient and family were attempted to be elicited.  We discussed the continued decline he has had in his nutrition and functional status for the last several weeks.  Discussed how functional status can relate to his ability to receive treatment as well as prognosis.  We reviewed options moving forward including consideration for disease modifying therapy versus pursuing only symptomatic management and best  supportive care.  I discussed with him regarding potential biopsy and this would be next step if he does want to know exactly what type of cancer this is and what options for care moving forward would look like.  He states that he would not want to pursue interventions that would cause him to feel worse, and he is not sure he would want any sort of chemotherapy.  Discussed other options for intervention including possibility of immunotherapy versus radiation versus other depending upon etiology of his metastatic disease.    We also discussed option of forgoing further testing and working to go home with the support of hospice.  Goals of care: He would like to consider options moving forward.  At this point in time, we are trying to delineate if he would like to have biopsy or work to transition home with hospice support.  We will plan to follow-up tomorrow at 1100.  Cancer-related pain: Reports that he has pain with movement.  This is incident pain which can be very difficult to treat.  While we are working to further delineate goals, I will work to transition back to IV medication as this will work much quicker than oral formulation.  Depending on goals, consider steroids and bisphosphonate therapy prior to discharge.  Start time: 1440 End time: 1600 Total time: 80 minutes  Greater than 50%  of this time was spent counseling and coordinating care related to the above assessment and plan.  Micheline Rough, MD [facility] [phone]

## 2020-02-14 NOTE — Progress Notes (Signed)
PROGRESS NOTE    Cree Kunert  QAS:[id number] DOB: 11-28-1945 DOA: 02/11/2020 PCP: Patient, No Pcp Per    Chief Complaint  Patient presents with  . Weakness  . Shortness of Breath    Brief Narrative: 74 year old male with prior history of COPD on home oxygen, lung nodule, hypertension, chronic diastolic heart failure presents to ED for generalized weakness and decreased appetite.  Arrival to the ED patient was found to be in AKI, hypercalcemia, elevated lactic acid, D-dimer greater than 20.  Urine analysis was negative for infection.  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 Complete spinal imaging recommended. He was referred to [facility] for admission.  Bilateral lower extremity duplex is negative for DVT.  CT of the chest without contrast and abdomen and pelvis shows 10 mm spiculated pulmonary nodule in the right upper lobe and a 5 mm nodule in the right upper lobe of the lungs. Reviewed the results with the oncologist on call Dr. Marin Olp who recommended IV Decadron 20 mg once followed by 10 mg twice daily until family decides regarding aggresive treatment versus hospice.  Meanwhile myeloma panel ordered for further evaluation. Pt seen and examined at bedside, he appears comfortable, not in distress. No new complaints. Would like to wait for the family to come by and discuss further .   Assessment & Plan:   Principal Problem:   Hypotension Active Problems:   COPD, group D, by GOLD 2017 classification ([place])   Chronic respiratory failure with hypoxia (HCC)   Hypertension   Hyperlipidemia   Solitary pulmonary nodule- R middle lobe- followed by pulm   Back pain   AKI (acute kidney injury) ([place])   Pressure injury of skin   Generalized weakness, back pain and decreased appetite Probably  secondary to metastatic disease vs myeloma.  Myeloma panel ordered.  CT of the chest without contrast shows 2 pulmonary nodules , 10 mm and 5 mm nodule in size in the right upper lobe of the lung suspicious for primary bronchogenic carcinoma. Pt has a 40 year history of smoking. Further evaluation of the spine ordered and the results reviewed with the patient and her daughter. Discussed with on-call oncologist who recommended 20 mg of IV Decadron followed by 10 mg twice daily of IV Decadron while we wait for the family decision regarding aggressive management versus comfort approach. pSA ordered and wnl. Will wait for palliative meeting today and proceed further.     Hypercalcemia;  Probably from malignancy vs dehydration.  Hydrate and repeat calcium level is 10.3 this morning. Check PTH, pending   Essential hypertension Well controlled.   Hypotension resolved with IV fluids.    AKI:  Probably sec to dehydration , poor oral intake and poor renal perfusion from hypotension  Improving with IV fluids.   Creatinine is back to baseline.    Hyperlipidemia: resume statin.     Pressure Injury present on admission.  Pressure Injury 02/11/20 Sacrum Left;Right Stage 1 -  Intact skin with non-blanchable redness of a localized area usually over a bony prominence. (Active)  02/11/20 1816  Location: Sacrum  Location Orientation: Left;Right  Staging: Stage 1 -  Intact skin with non-blanchable redness of a localized area usually over a bony prominence.  Wound Description (Comments):   Present on Admission: Yes   Wound care  consulted and recommendations given.    Mild anemia:  Stable hemoglobin around 12.     Elevated lactic acid:  Resolved with IV fluids.    Chronic respiratory failure with hypoxia 2 L of nasal cannula oxygen. No wheezing heard on exam continue to monitor.    Solitary pulmonary nodule 10 mm spiculated pulmonary nodule seen in the right upper lobe suspicious  for primary bronchogenic carcinoma.    Discussed the results of the MRI of the lumbar spine with the patient's daughter she wanted to talk to the rest of the family members and the patient and decide whether to pursue aggressive route vs pain control. She would like  to talk to palliative care for goals of care. Palliative care meeting meeting today around 11am  and family would like to tell us their decision regarding aggressive management versus comfort measures .      DVT prophylaxis: Lovenox.  Code Status: DNR. Family Communication: discussed with daughter over the phone Disposition:   Status is: Inpatient  Remains inpatient appropriate because:Ongoing diagnostic testing needed not appropriate for outpatient work up   Dispo: The patient is from: Home              Anticipated d/c is to: pending.               Anticipated d/c date is: > 3 days              Patient currently is not medically stable to d/c.       Consultants:   Palliative care.   Procedures:  MRI Lumbar spine.  Antimicrobials: none.   Subjective: Pain better with IV DECADRON. No chest pain or sob. No nausea, vomiting or abd pain.   Objective: Vitals:   02/13/20 2200 02/13/20 2243 02/14/20 0502 02/14/20 0736  BP:   (!) 153/93   Pulse:   104   Resp: 17 18 20    Temp:   98.3 F (36.8 C)   TempSrc:   Oral   SpO2:   98% 95%  Weight:      Height:        Intake/Output Summary (Last 24 hours) at 02/14/2020 1054 Last data filed at 02/14/2020 0900 Gross per 24 hour  Intake 1735.73 ml  Output 1125 ml  Net 610.73 ml   Filed Weights   02/11/20 1151  Weight: 78.5 kg    Examination:  General exam: alert and comfortable, no distress.  Respiratory system: clear to auscultation, no wheezing or rhonchi.  Cardiovascular system: S1-S2 heard, regular rate rhythm, no JVD, no pedal edema Gastrointestinal system: Diminished soft, nontender, nondistended, bowel sounds normal Central nervous system: Alert and  oriented extremities: No cyanosis or clubbing Skin: Stage I sacral decubitus Psychiatry: Mood is appropriate    Data Reviewed: I have personally reviewed following labs and imaging studies  CBC: Recent Labs  Lab 02/11/20 1249 02/12/20 0528 02/13/20 1032  WBC 11.1* 7.8 9.8  NEUTROABS 9.1*  --   --   HGB 14.0 11.8* 12.2*  HCT 44.5 38.2* 38.9*  MCV 94.5 96.7 95.6  PLT 381 286 379    Basic Metabolic Panel: Recent Labs  Lab 02/11/20 1249 02/12/20 0528 02/13/20 1032  NA 141 143 140  K 4.6 5.0 4.5  CL 96* 105 100  CO2 25 30 24   GLUCOSE 150* 107* 105*  BUN 99* 71* 32*  CREATININE 2.52* 1.50* 1.07  CALCIUM 11.8* 10.8* 10.3    GFR: Estimated Creatinine Clearance: 66.5 mL/min (by C-G  formula based on SCr of 1.07 mg/dL).  Liver Function Tests: Recent Labs  Lab 02/11/20 1249 02/12/20 0528  AST 41 37  ALT 25 23  ALKPHOS 115 101  BILITOT 0.8 0.5  PROT 8.0 6.8  ALBUMIN 3.7 3.3*    CBG: No results for input(s): GLUCAP in the last 168 hours.   Recent Results (from the past 240 hour(s))  SARS Coronavirus 2 by RT PCR (hospital order, performed in [facility] hospital lab) Nasopharyngeal Nasopharyngeal Swab     Status: None   Collection Time: 02/11/20  1:24 PM   Specimen: Nasopharyngeal Swab  Result Value Ref Range Status   SARS Coronavirus 2 NEGATIVE NEGATIVE Final    Comment: (NOTE) SARS-CoV-2 target nucleic acids are NOT DETECTED.  The SARS-CoV-2 RNA is generally detectable in upper and lower respiratory specimens during the acute phase of infection. The lowest concentration of SARS-CoV-2 viral copies this assay can detect is 250 copies / mL. A negative result does not preclude SARS-CoV-2 infection and should not be used as the sole basis for treatment or other patient management decisions.  A negative result may occur with improper specimen collection / handling, submission of specimen other than nasopharyngeal swab, presence of viral mutation(s) within  the areas targeted by this assay, and inadequate number of viral copies (<250 copies / mL). A negative result must be combined with clinical observations, patient history, and epidemiological information.  Fact Sheet for Patients:   StrictlyIdeas.no  Fact Sheet for Healthcare Providers: BankingDealers.co.za  This test is not yet approved or  cleared by the [place] FDA and has been authorized for detection and/or diagnosis of SARS-CoV-2 by FDA under an Emergency Use Authorization (EUA).  This EUA will remain in effect (meaning this test can be used) for the duration of the COVID-19 declaration under Section 564(b)(1) of the Act, 21 U.S.C. section 360bbb-3(b)(1), unless the authorization is terminated or revoked sooner.  Performed at [facility], [place] [address] [place], [place] [address]   Urine culture     Status: Abnormal   Collection Time: 02/11/20  8:30 PM   Specimen: Urine, Clean Catch  Result Value Ref Range Status   Specimen Description   Final    URINE, CLEAN CATCH Performed at [facility], [place] [address], [place], [place] [address]    Special Requests   Final    NONE Performed at [facility], [place] [address] [place], [place] [address]    Culture (A)  Final    <10,000 COLONIES/mL INSIGNIFICANT GROWTH Performed at [place] [address], [place],  [address]    Report Status 02/12/2020 FINAL  Final         Radiology Studies: CT ABDOMEN PELVIS WO CONTRAST  Result Date: 02/13/2020 CLINICAL DATA:  Metastatic disease of unknown primary. EXAM: CT CHEST, ABDOMEN AND PELVIS WITHOUT CONTRAST TECHNIQUE: Multidetector CT imaging of the chest, abdomen and pelvis was performed following the standard protocol without IV contrast. COMPARISON:  CT chest 02/19/2019. FINDINGS: CT CHEST FINDINGS Cardiovascular: The heart size is normal. No substantial  pericardial effusion. Coronary artery calcification is evident. Atherosclerotic calcification is noted in the wall of the thoracic aorta. Mediastinum/Nodes: No mediastinal lymphadenopathy. No evidence for gross hilar lymphadenopathy although assessment is limited by the lack of intravenous contrast on today's study. The esophagus has normal imaging features. There is no axillary lymphadenopathy. Lungs/Pleura: Centrilobular and paraseptal emphysema evident. Bullous change noted right lung apex with biapical pleuroparenchymal scarring.  10 mm spiculated right upper lobe pulmonary nodule identified on image 43/series 4. 5 mm posterior right upper lobe nodule evident on 58/4. Scattered areas of architectural distortion and parenchymal scarring noted. Circumferential bronchial wall thickening evident bilaterally with substantial peripheral airway impaction in the posterior right lower lobe. Atelectasis noted posterior costophrenic sulci. Musculoskeletal: Lytic lesions are identified in the manubrium measuring up to 13 mm (sagittal image 96/series 6). T11 compression fracture evident. 4.4 x 3.4 cm soft tissue mass destroys the right T5 pedicle and transverse process with involvement of the posterior right fifth rib. CT ABDOMEN PELVIS FINDINGS Hepatobiliary: Not well demonstrated on this noncontrast study, suspicion for a 1.8 cm lesion in the dome of the lateral segment left liver (narrow windows on axial 56/2). Small area of low attenuation in the anterior liver, adjacent to the falciform ligament, is in a characteristic location for focal fatty deposition. There is no evidence for gallstones, gallbladder wall thickening, or pericholecystic fluid. No intrahepatic or extrahepatic biliary dilation. Pancreas: No focal mass lesion. No dilatation of the main duct. No intraparenchymal cyst. No peripancreatic edema. Spleen: No splenomegaly. No focal mass lesion. Adrenals/Urinary Tract: No adrenal nodule or mass. Left kidney has  unremarkable noncontrast appearance. Cystic lesion in the upper pole right kidney likely focally dilated upper pole calyx although central sinus cysts could have this appearance. No evidence for hydroureter. The urinary bladder appears normal for the degree of distention. Stomach/Bowel: Stomach is unremarkable. No gastric wall thickening. No evidence of outlet obstruction. Duodenum is normally positioned as is the ligament of Treitz. No small bowel wall thickening. No small bowel dilatation. The terminal ileum is normal. The appendix is normal. No gross colonic mass. No colonic wall thickening. Diverticular changes are noted in the left colon without evidence of diverticulitis. Vascular/Lymphatic: Saccular aneurysm identified infrarenal abdominal aorta measuring up to 3.2 cm in maximum diameter. There is no gastrohepatic or hepatoduodenal ligament lymphadenopathy. No retroperitoneal or mesenteric lymphadenopathy. No pelvic sidewall lymphadenopathy. Reproductive: Prostate gland is enlarged. Other: No intraperitoneal free fluid. Musculoskeletal: Right groin hernia contains distal ileal loops without obstruction or complicating features. Destructive tumor identified at S1, better characterized on recent MRI. Additional bony metastatic disease also better assessed at MRI. IMPRESSION: 1. 10 mm spiculated right upper lobe pulmonary nodule. Primary bronchogenic neoplasm versus metastatic deposit. 2. 5 mm posterior right upper lobe pulmonary nodule. Metastatic disease not excluded. Close attention on follow-up recommended. 3. Circumferential bronchial wall thickening with substantial peripheral airway impaction in the posterior right lower lobe. Imaging features may be related to atypical infection although aspiration could have this appearance in the appropriate clinical setting. 4. 1.8 cm suspected lesion in the dome of the lateral segment left liver, not well evaluated on this noncontrast exam metastatic disease a  concern. 5. Metastatic disease identified in the manubrium and bulky soft tissue metastatic deposit involves the right T5 pedicle, transverse process and posterior right fifth rib. 6. 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 7. Right groin hernia contains distal ileal loops without obstruction or complicating features. 8. Saccular infrarenal abdominal aortic aneurysm measuring up to 3.2 cm in maximum diameter. Attention on follow-up recommended. 9. Prostatomegaly. 10. Aortic Atherosclerosis (ICD10-I70.0) and Emphysema (ICD10-J43.9). Electronically Signed   By: Misty Stanley M.D.   On: 02/13/2020 08:11   CT CHEST WO CONTRAST  Result Date: 02/13/2020 CLINICAL DATA:  Metastatic disease of unknown primary. EXAM: CT CHEST, ABDOMEN AND PELVIS WITHOUT CONTRAST TECHNIQUE: Multidetector CT imaging of the chest, abdomen  and pelvis was performed following the standard protocol without IV contrast. COMPARISON:  CT chest 02/19/2019. FINDINGS: CT CHEST FINDINGS Cardiovascular: The heart size is normal. No substantial pericardial effusion. Coronary artery calcification is evident. Atherosclerotic calcification is noted in the wall of the thoracic aorta. Mediastinum/Nodes: No mediastinal lymphadenopathy. No evidence for gross hilar lymphadenopathy although assessment is limited by the lack of intravenous contrast on today's study. The esophagus has normal imaging features. There is no axillary lymphadenopathy. Lungs/Pleura: Centrilobular and paraseptal emphysema evident. Bullous change noted right lung apex with biapical pleuroparenchymal scarring. 10 mm spiculated right upper lobe pulmonary nodule identified on image 43/series 4. 5 mm posterior right upper lobe nodule evident on 58/4. Scattered areas of architectural distortion and parenchymal scarring noted. Circumferential bronchial wall thickening evident bilaterally with substantial peripheral airway impaction in the posterior right  lower lobe. Atelectasis noted posterior costophrenic sulci. Musculoskeletal: Lytic lesions are identified in the manubrium measuring up to 13 mm (sagittal image 96/series 6). T11 compression fracture evident. 4.4 x 3.4 cm soft tissue mass destroys the right T5 pedicle and transverse process with involvement of the posterior right fifth rib. CT ABDOMEN PELVIS FINDINGS Hepatobiliary: Not well demonstrated on this noncontrast study, suspicion for a 1.8 cm lesion in the dome of the lateral segment left liver (narrow windows on axial 56/2). Small area of low attenuation in the anterior liver, adjacent to the falciform ligament, is in a characteristic location for focal fatty deposition. There is no evidence for gallstones, gallbladder wall thickening, or pericholecystic fluid. No intrahepatic or extrahepatic biliary dilation. Pancreas: No focal mass lesion. No dilatation of the main duct. No intraparenchymal cyst. No peripancreatic edema. Spleen: No splenomegaly. No focal mass lesion. Adrenals/Urinary Tract: No adrenal nodule or mass. Left kidney has unremarkable noncontrast appearance. Cystic lesion in the upper pole right kidney likely focally dilated upper pole calyx although central sinus cysts could have this appearance. No evidence for hydroureter. The urinary bladder appears normal for the degree of distention. Stomach/Bowel: Stomach is unremarkable. No gastric wall thickening. No evidence of outlet obstruction. Duodenum is normally positioned as is the ligament of Treitz. No small bowel wall thickening. No small bowel dilatation. The terminal ileum is normal. The appendix is normal. No gross colonic mass. No colonic wall thickening. Diverticular changes are noted in the left colon without evidence of diverticulitis. Vascular/Lymphatic: Saccular aneurysm identified infrarenal abdominal aorta measuring up to 3.2 cm in maximum diameter. There is no gastrohepatic or hepatoduodenal ligament lymphadenopathy. No  retroperitoneal or mesenteric lymphadenopathy. No pelvic sidewall lymphadenopathy. Reproductive: Prostate gland is enlarged. Other: No intraperitoneal free fluid. Musculoskeletal: Right groin hernia contains distal ileal loops without obstruction or complicating features. Destructive tumor identified at S1, better characterized on recent MRI. Additional bony metastatic disease also better assessed at MRI. IMPRESSION: 1. 10 mm spiculated right upper lobe pulmonary nodule. Primary bronchogenic neoplasm versus metastatic deposit. 2. 5 mm posterior right upper lobe pulmonary nodule. Metastatic disease not excluded. Close attention on follow-up recommended. 3. Circumferential bronchial wall thickening with substantial peripheral airway impaction in the posterior right lower lobe. Imaging features may be related to atypical infection although aspiration could have this appearance in the appropriate clinical setting. 4. 1.8 cm suspected lesion in the dome of the lateral segment left liver, not well evaluated on this noncontrast exam metastatic disease a concern. 5. Metastatic disease identified in the manubrium and bulky soft tissue metastatic deposit involves the right T5 pedicle, transverse process and posterior right fifth rib. 6. Metastatic disease in  the lumbosacral spine better characterized on recent MRI. 7. Right groin hernia contains distal ileal loops without obstruction or complicating features. 8. Saccular infrarenal abdominal aortic aneurysm measuring up to 3.2 cm in maximum diameter. Attention on follow-up recommended. 9. Prostatomegaly. 10. Aortic Atherosclerosis (ICD10-I70.0) and Emphysema (ICD10-J43.9). Electronically Signed   By: Misty Stanley M.D.   On: 02/13/2020 08:11   MR THORACIC SPINE WO CONTRAST  Result Date: 02/12/2020 CLINICAL DATA:  Initial evaluation for mid back pain. EXAM: MRI THORACIC SPINE WITHOUT CONTRAST TECHNIQUE: Multiplanar, multisequence MR imaging of the thoracic spine was  performed. No intravenous contrast was administered. COMPARISON:  Prior MRI of the lumbar spine from 02/11/2020. FINDINGS: Alignment: Exaggeration of the normal thoracic kyphosis with underlying mild dextroscoliosis. No listhesis. Vertebrae: Abnormal marrow signal intensity consistent with metastatic disease or possibly myeloma seen throughout the thoracic spine, with involvement of nearly all levels. Involvement most pronounced within the mid and lower thoracic spine extending from T7 inferiorly. Complete involvement of the T11 vertebral body with associated pathologic compression fracture, with height loss measuring up to 75%. 5 mm of retropulsed bone and/or tumor protrudes into the spinal canal. Resultant mild spinal stenosis with minimal flattening of the ventral spinal cord, but no frank cord compression or cord signal changes. Probable additional compression fracture noted extending through the superior endplate of L1, partially visualized, better evaluated on previous lumbar MRI. No other pathologic fracture identified. Prominent tumor involvement of the right posterior elements noted at T5 (series 25, image 8). No other significant extra osseous or epidural tumor identified. Probable scattered rib involvement noted as well. Cord: Signal intensity within the thoracic spinal cord is within normal limits. Paraspinal and other soft tissues: Soft tissue edema noted within the right upper posterior back at the level of T4-5 related to prominent tumor burden involving the adjacent right T5 posterior elements (series 21, image 5). Paraspinous soft tissues demonstrate no other acute finding. Trace layering bilateral pleural effusions noted. Disc levels: No significant disc pathology seen within the thoracic spine. No other significant canal or foraminal stenosis at this time. IMPRESSION: 1. Abnormal marrow signal intensity throughout the thoracic spine, consistent with metastatic disease or myeloma. 2. Complete  involvement of the T11 vertebral body with associated pathologic compression fracture, with 5 mm of retropulsed bone and/or tumor protruding into the spinal canal. Resultant mild spinal stenosis without frank cord compression or cord signal changes. 3. Additional prominent tumor involvement involving the right posterior elements of T5 with associated adjacent paraspinous edema. 4. No other significant epidural or extraosseous tumor identified. No significant disc pathology or stenosis. Electronically Signed   By: Jeannine Boga M.D.   On: 02/12/2020 22:52   VAS [place] LOWER EXTREMITY VENOUS (DVT)  Result Date: 02/14/2020  Lower Venous DVTStudy Indications: Elevated ddimer.  Comparison Study: no prior Performing Technologist: Abram Sander RVS  Examination Guidelines: A complete evaluation includes B-mode imaging, spectral Doppler, color Doppler, and power Doppler as needed of all accessible portions of each vessel. Bilateral testing is considered an integral part of a complete examination. Limited examinations for reoccurring indications may be performed as noted. The reflux portion of the exam is performed with the patient in reverse Trendelenburg.  +---------+---------------+---------+-----------+----------+--------------+ RIGHT    CompressibilityPhasicitySpontaneityPropertiesThrombus Aging +---------+---------------+---------+-----------+----------+--------------+ CFV      Full           Yes      Yes                                 +---------+---------------+---------+-----------+----------+--------------+  SFJ      Full                                                        +---------+---------------+---------+-----------+----------+--------------+ FV Prox  Full                                                        +---------+---------------+---------+-----------+----------+--------------+ FV Mid   Full                                                         +---------+---------------+---------+-----------+----------+--------------+ FV DistalFull                                                        +---------+---------------+---------+-----------+----------+--------------+ PFV      Full                                                        +---------+---------------+---------+-----------+----------+--------------+ POP      Full           Yes      Yes                                 +---------+---------------+---------+-----------+----------+--------------+ PTV      Full                                                        +---------+---------------+---------+-----------+----------+--------------+ PERO     Full                                                        +---------+---------------+---------+-----------+----------+--------------+   +---------+---------------+---------+-----------+----------+--------------+ LEFT     CompressibilityPhasicitySpontaneityPropertiesThrombus Aging +---------+---------------+---------+-----------+----------+--------------+ CFV      Full           Yes      Yes                                 +---------+---------------+---------+-----------+----------+--------------+ SFJ      Full                                                        +---------+---------------+---------+-----------+----------+--------------+  FV Prox  Full                                                        +---------+---------------+---------+-----------+----------+--------------+ FV Mid   Full                                                        +---------+---------------+---------+-----------+----------+--------------+ FV DistalFull                                                        +---------+---------------+---------+-----------+----------+--------------+ PFV      Full                                                         +---------+---------------+---------+-----------+----------+--------------+ POP      Full           Yes      Yes                                 +---------+---------------+---------+-----------+----------+--------------+ PTV      Full                                                        +---------+---------------+---------+-----------+----------+--------------+ PERO     Full                                                        +---------+---------------+---------+-----------+----------+--------------+     Summary: BILATERAL: - No evidence of deep vein thrombosis seen in the lower extremities, bilaterally. - No evidence of superficial venous thrombosis in the lower extremities, bilaterally. -   *See table(s) above for measurements and observations. Electronically signed by Deitra Mayo MD on 02/14/2020 at 4:18:52 AM.    Final         Scheduled Meds: . atorvastatin  80 mg Oral q1800  . dexamethasone (DECADRON) injection  10 mg Intravenous Q12H  . enoxaparin (LOVENOX) injection  40 mg Subcutaneous Q24H  . fluticasone furoate-vilanterol  1 puff Inhalation Daily   And  . umeclidinium bromide  1 puff Inhalation Daily   Continuous Infusions: . sodium chloride 75 mL/hr at 02/13/20 2348     LOS: 3 days        Hosie Poisson, MD [facility]   To contact the attending provider between 7A-7P or the covering provider during after hours 7P-7A, please log into the web site [email or web address] and access using universal [place]  password for that web site. If you do not have the password, please call the hospital operator.  02/14/2020, 10:54 AM

## 2020-02-14 NOTE — Consult Note (Signed)
Chief Complaint: T5 paraspinal mass. Request is for T5 biopsy.  Referring Physician(s): Dr. Venia Minks  Supervising Physician: Sandi Mariscal  Patient Status: [facility] - In-pt  History of Present Illness: George Frazier is a 74 y.o. male  History of HTN, COPD on 2L home O2, CHF with a known RUL pulmonary nodule ( declined bronchoscopy in 2020). Patient presented to [facility] ED with generalized weakness and decreased appetite x  Days. Patient was found to be in AKI with elevated lactic acid and D-dimer.  CT abdomen pelvis from 7.30.21 reads 4.4 X 3.4 soft tissue mass destroys the right T5 pedicle and transverse process with involvement of the posterior right fifth rib.Team initially request was for a lung biopsy and evaluation of possible kyphoplasty. After review of the case IR Attending  Dr. Owens Shark recommends a T5 biopsy with review of a possible kyphoplasty when pathology has resulted. This was communicated directly to the Team who is in agreement with the plan of care..    Past Medical History:  Diagnosis Date  . CHF (congestive heart failure) ([place])   . COPD (chronic obstructive pulmonary disease) ([place])   . Hypertension     History reviewed. No pertinent surgical history.  Allergies: Patient has no known allergies.  Medications: Prior to Admission medications   Medication Sig Start Date End Date Taking? Authorizing Provider  albuterol (PROVENTIL HFA;VENTOLIN HFA) 108 (90 Base) MCG/ACT inhaler Inhale 2 puffs into the lungs every 4 (four) hours as needed for wheezing or shortness of breath. 09/21/17  Yes Shahmehdi, Seyed A, MD  albuterol (PROVENTIL) (2.5 MG/3ML) 0.083% nebulizer solution Take 3 mLs (2.5 mg total) by nebulization 3 (three) times daily. Patient taking differently: Take 2.5 mg by nebulization every 6 (six) hours as needed for wheezing.  09/05/18  Yes Mannam, Praveen, MD  atorvastatin (LIPITOR) 80 MG tablet Take 1 tablet (80 mg total) by mouth daily at 6 PM. 12/16/19  Yes Abonza,  Maritza, PA-C  furosemide (LASIX) 20 MG tablet Take 0.5 tablets (10 mg total) by mouth daily. 12/16/19  Yes Abonza, Maritza, PA-C  metoprolol succinate (TOPROL-XL) 50 MG 24 hr tablet Take 1 tablet (50 mg total) by mouth daily. 12/16/19  Yes Abonza, Maritza, PA-C  Olmesartan-amLODIPine-HCTZ 20-5-12.5 MG TABS TAKE 1 TABLET BY MOUTH DAILY. 12/16/19  Yes Abonza, Maritza, PA-C  TRELEGY ELLIPTA 100-62.5-25 MCG/INH AEPB INHALE 1 PUFF INTO THE LUNGS DAILY. NEED APPOINTMENT FOR FURTHER REFILLS Patient taking differently: Inhale 1 puff into the lungs daily.  12/09/19  Yes Marshell Garfinkel, MD     Family History  Problem Relation Age of Onset  . CAD Mother 42  . Hypertension Mother   . Stroke Mother   . COPD Father 88  . AAA (abdominal aortic aneurysm) Father   . COPD Brother   . COPD Brother   . Cancer Neg Hx     Social History   Socioeconomic History  . Marital status: Single    Spouse name: Not on file  . Number of children: Not on file  . Years of education: Not on file  . Highest education level: Not on file  Occupational History  . Occupation: retired  Tobacco Use  . Smoking status: Former Smoker    Packs/day: 1.00    Years: 40.00    Pack years: 40.00    Quit date: 2008    Years since quitting: 13.5  . Smokeless tobacco: Former Network engineer  . Vaping Use: Never used  Substance and Sexual  Activity  . Alcohol use: No  . Drug use: No  . Sexual activity: Never  Other Topics Concern  . Not on file  Social History Narrative  . Not on file   Social Determinants of Health   Financial Resource Strain:   . Difficulty of Paying Living Expenses:   Food Insecurity:   . Worried About Charity fundraiser in the Last Year:   . Arboriculturist in the Last Year:   Transportation Needs:   . Film/video editor (Medical):   Marland Kitchen Lack of Transportation (Non-Medical):   Physical Activity:   . Days of Exercise per Week:   . Minutes of Exercise per Session:   Stress:   . Feeling of  Stress :   Social Connections:   . Frequency of Communication with Friends and Family:   . Frequency of Social Gatherings with Friends and Family:   . Attends Religious Services:   . Active Member of Clubs or Organizations:   . Attends Archivist Meetings:   Marland Kitchen Marital Status:     Review of Systems: A 12 point ROS discussed and pertinent positives are indicated in the HPI above.  All other systems are negative.  Review of Systems  Constitutional: Negative for fever.  HENT: Negative for congestion.   Respiratory: Negative for cough and shortness of breath.   Cardiovascular: Negative for chest pain.  Gastrointestinal: Negative for abdominal pain.  Musculoskeletal: Positive for back pain ( Patient reports an improvement since admission.).  Neurological: Negative for headaches.  Psychiatric/Behavioral: Negative for behavioral problems and confusion.    Vital Signs: BP (!) 152/97 (BP Location: Right Arm)   Pulse 98   Temp 98.2 F (36.8 C) (Oral)   Resp 18   Ht 6' (1.829 m)   Wt 173 lb (78.5 kg)   SpO2 95%   BMI 23.46 kg/m   Physical Exam Vitals and nursing note reviewed.  Constitutional:      Appearance: He is well-developed.  HENT:     Head: Normocephalic.  Cardiovascular:     Rate and Rhythm: Normal rate.  Pulmonary:     Effort: Pulmonary effort is normal.     Breath sounds: Examination of the right-upper field reveals wheezing. Examination of the left-upper field reveals wheezing. Examination of the right-middle field reveals wheezing. Examination of the left-middle field reveals wheezing. Examination of the right-lower field reveals wheezing. Examination of the left-lower field reveals wheezing. Wheezing present.     Comments: On nasal cannula O2 Musculoskeletal:        General: Normal range of motion.     Cervical back: Normal range of motion.  Skin:    General: Skin is dry.  Neurological:     Mental Status: He is alert and oriented to person, place, and  time.     Imaging: CT ABDOMEN PELVIS WO CONTRAST  Result Date: 02/13/2020 CLINICAL DATA:  Metastatic disease of unknown primary. EXAM: CT CHEST, ABDOMEN AND PELVIS WITHOUT CONTRAST TECHNIQUE: Multidetector CT imaging of the chest, abdomen and pelvis was performed following the standard protocol without IV contrast. COMPARISON:  CT chest 02/19/2019. FINDINGS: CT CHEST FINDINGS Cardiovascular: The heart size is normal. No substantial pericardial effusion. Coronary artery calcification is evident. Atherosclerotic calcification is noted in the wall of the thoracic aorta. Mediastinum/Nodes: No mediastinal lymphadenopathy. No evidence for gross hilar lymphadenopathy although assessment is limited by the lack of intravenous contrast on today's study. The esophagus has normal imaging features. There is no axillary  lymphadenopathy. Lungs/Pleura: Centrilobular and paraseptal emphysema evident. Bullous change noted right lung apex with biapical pleuroparenchymal scarring. 10 mm spiculated right upper lobe pulmonary nodule identified on image 43/series 4. 5 mm posterior right upper lobe nodule evident on 58/4. Scattered areas of architectural distortion and parenchymal scarring noted. Circumferential bronchial wall thickening evident bilaterally with substantial peripheral airway impaction in the posterior right lower lobe. Atelectasis noted posterior costophrenic sulci. Musculoskeletal: Lytic lesions are identified in the manubrium measuring up to 13 mm (sagittal image 96/series 6). T11 compression fracture evident. 4.4 x 3.4 cm soft tissue mass destroys the right T5 pedicle and transverse process with involvement of the posterior right fifth rib. CT ABDOMEN PELVIS FINDINGS Hepatobiliary: Not well demonstrated on this noncontrast study, suspicion for a 1.8 cm lesion in the dome of the lateral segment left liver (narrow windows on axial 56/2). Small area of low attenuation in the anterior liver, adjacent to the falciform  ligament, is in a characteristic location for focal fatty deposition. There is no evidence for gallstones, gallbladder wall thickening, or pericholecystic fluid. No intrahepatic or extrahepatic biliary dilation. Pancreas: No focal mass lesion. No dilatation of the main duct. No intraparenchymal cyst. No peripancreatic edema. Spleen: No splenomegaly. No focal mass lesion. Adrenals/Urinary Tract: No adrenal nodule or mass. Left kidney has unremarkable noncontrast appearance. Cystic lesion in the upper pole right kidney likely focally dilated upper pole calyx although central sinus cysts could have this appearance. No evidence for hydroureter. The urinary bladder appears normal for the degree of distention. Stomach/Bowel: Stomach is unremarkable. No gastric wall thickening. No evidence of outlet obstruction. Duodenum is normally positioned as is the ligament of Treitz. No small bowel wall thickening. No small bowel dilatation. The terminal ileum is normal. The appendix is normal. No gross colonic mass. No colonic wall thickening. Diverticular changes are noted in the left colon without evidence of diverticulitis. Vascular/Lymphatic: Saccular aneurysm identified infrarenal abdominal aorta measuring up to 3.2 cm in maximum diameter. There is no gastrohepatic or hepatoduodenal ligament lymphadenopathy. No retroperitoneal or mesenteric lymphadenopathy. No pelvic sidewall lymphadenopathy. Reproductive: Prostate gland is enlarged. Other: No intraperitoneal free fluid. Musculoskeletal: Right groin hernia contains distal ileal loops without obstruction or complicating features. Destructive tumor identified at S1, better characterized on recent MRI. Additional bony metastatic disease also better assessed at MRI. IMPRESSION: 1. 10 mm spiculated right upper lobe pulmonary nodule. Primary bronchogenic neoplasm versus metastatic deposit. 2. 5 mm posterior right upper lobe pulmonary nodule. Metastatic disease not excluded. Close  attention on follow-up recommended. 3. Circumferential bronchial wall thickening with substantial peripheral airway impaction in the posterior right lower lobe. Imaging features may be related to atypical infection although aspiration could have this appearance in the appropriate clinical setting. 4. 1.8 cm suspected lesion in the dome of the lateral segment left liver, not well evaluated on this noncontrast exam metastatic disease a concern. 5. Metastatic disease identified in the manubrium and bulky soft tissue metastatic deposit involves the right T5 pedicle, transverse process and posterior right fifth rib. 6. 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 7. Right groin hernia contains distal ileal loops without obstruction or complicating features. 8. Saccular infrarenal abdominal aortic aneurysm measuring up to 3.2 cm in maximum diameter. Attention on follow-up recommended. 9. Prostatomegaly. 10. Aortic Atherosclerosis (ICD10-I70.0) and Emphysema (ICD10-J43.9). Electronically Signed   By: Misty Stanley M.D.   On: 02/13/2020 08:11   DG Chest 2 View  Result Date: 02/11/2020 CLINICAL DATA:  Shortness of breath. EXAM:  CHEST - 2 VIEW COMPARISON:  August 08, 2018. FINDINGS: The heart size and mediastinal contours are within normal limits. Both lungs are clear. No pneumothorax or pleural effusion is noted. The visualized skeletal structures are unremarkable. IMPRESSION: No active cardiopulmonary disease. Aortic Atherosclerosis (ICD10-I70.0). Electronically Signed   By: Marijo Conception M.D.   On: 02/11/2020 12:24   CT CHEST WO CONTRAST  Result Date: 02/13/2020 CLINICAL DATA:  Metastatic disease of unknown primary. EXAM: CT CHEST, ABDOMEN AND PELVIS WITHOUT CONTRAST TECHNIQUE: Multidetector CT imaging of the chest, abdomen and pelvis was performed following the standard protocol without IV contrast. COMPARISON:  CT chest 02/19/2019. FINDINGS: CT CHEST FINDINGS Cardiovascular:  The heart size is normal. No substantial pericardial effusion. Coronary artery calcification is evident. Atherosclerotic calcification is noted in the wall of the thoracic aorta. Mediastinum/Nodes: No mediastinal lymphadenopathy. No evidence for gross hilar lymphadenopathy although assessment is limited by the lack of intravenous contrast on today's study. The esophagus has normal imaging features. There is no axillary lymphadenopathy. Lungs/Pleura: Centrilobular and paraseptal emphysema evident. Bullous change noted right lung apex with biapical pleuroparenchymal scarring. 10 mm spiculated right upper lobe pulmonary nodule identified on image 43/series 4. 5 mm posterior right upper lobe nodule evident on 58/4. Scattered areas of architectural distortion and parenchymal scarring noted. Circumferential bronchial wall thickening evident bilaterally with substantial peripheral airway impaction in the posterior right lower lobe. Atelectasis noted posterior costophrenic sulci. Musculoskeletal: Lytic lesions are identified in the manubrium measuring up to 13 mm (sagittal image 96/series 6). T11 compression fracture evident. 4.4 x 3.4 cm soft tissue mass destroys the right T5 pedicle and transverse process with involvement of the posterior right fifth rib. CT ABDOMEN PELVIS FINDINGS Hepatobiliary: Not well demonstrated on this noncontrast study, suspicion for a 1.8 cm lesion in the dome of the lateral segment left liver (narrow windows on axial 56/2). Small area of low attenuation in the anterior liver, adjacent to the falciform ligament, is in a characteristic location for focal fatty deposition. There is no evidence for gallstones, gallbladder wall thickening, or pericholecystic fluid. No intrahepatic or extrahepatic biliary dilation. Pancreas: No focal mass lesion. No dilatation of the main duct. No intraparenchymal cyst. No peripancreatic edema. Spleen: No splenomegaly. No focal mass lesion. Adrenals/Urinary Tract: No  adrenal nodule or mass. Left kidney has unremarkable noncontrast appearance. Cystic lesion in the upper pole right kidney likely focally dilated upper pole calyx although central sinus cysts could have this appearance. No evidence for hydroureter. The urinary bladder appears normal for the degree of distention. Stomach/Bowel: Stomach is unremarkable. No gastric wall thickening. No evidence of outlet obstruction. Duodenum is normally positioned as is the ligament of Treitz. No small bowel wall thickening. No small bowel dilatation. The terminal ileum is normal. The appendix is normal. No gross colonic mass. No colonic wall thickening. Diverticular changes are noted in the left colon without evidence of diverticulitis. Vascular/Lymphatic: Saccular aneurysm identified infrarenal abdominal aorta measuring up to 3.2 cm in maximum diameter. There is no gastrohepatic or hepatoduodenal ligament lymphadenopathy. No retroperitoneal or mesenteric lymphadenopathy. No pelvic sidewall lymphadenopathy. Reproductive: Prostate gland is enlarged. Other: No intraperitoneal free fluid. Musculoskeletal: Right groin hernia contains distal ileal loops without obstruction or complicating features. Destructive tumor identified at S1, better characterized on recent MRI. Additional bony metastatic disease also better assessed at MRI. IMPRESSION: 1. 10 mm spiculated right upper lobe pulmonary nodule. Primary bronchogenic neoplasm versus metastatic deposit. 2. 5 mm posterior right upper lobe pulmonary nodule. Metastatic disease not excluded.  Close attention on follow-up recommended. 3. Circumferential bronchial wall thickening with substantial peripheral airway impaction in the posterior right lower lobe. Imaging features may be related to atypical infection although aspiration could have this appearance in the appropriate clinical setting. 4. 1.8 cm suspected lesion in the dome of the lateral segment left liver, not well evaluated on this  noncontrast exam metastatic disease a concern. 5. Metastatic disease identified in the manubrium and bulky soft tissue metastatic deposit involves the right T5 pedicle, transverse process and posterior right fifth rib. 6. 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 7. Right groin hernia contains distal ileal loops without obstruction or complicating features. 8. Saccular infrarenal abdominal aortic aneurysm measuring up to 3.2 cm in maximum diameter. Attention on follow-up recommended. 9. Prostatomegaly. 10. Aortic Atherosclerosis (ICD10-I70.0) and Emphysema (ICD10-J43.9). Electronically Signed   By: Misty Stanley M.D.   On: 02/13/2020 08:11   MR THORACIC SPINE WO CONTRAST  Result Date: 02/12/2020 CLINICAL DATA:  Initial evaluation for mid back pain. EXAM: MRI THORACIC SPINE WITHOUT CONTRAST TECHNIQUE: Multiplanar, multisequence MR imaging of the thoracic spine was performed. No intravenous contrast was administered. COMPARISON:  Prior MRI of the lumbar spine from 02/11/2020. FINDINGS: Alignment: Exaggeration of the normal thoracic kyphosis with underlying mild dextroscoliosis. No listhesis. Vertebrae: Abnormal marrow signal intensity consistent with metastatic disease or possibly myeloma seen throughout the thoracic spine, with involvement of nearly all levels. Involvement most pronounced within the mid and lower thoracic spine extending from T7 inferiorly. Complete involvement of the T11 vertebral body with associated pathologic compression fracture, with height loss measuring up to 75%. 5 mm of retropulsed bone and/or tumor protrudes into the spinal canal. Resultant mild spinal stenosis with minimal flattening of the ventral spinal cord, but no frank cord compression or cord signal changes. Probable additional compression fracture noted extending through the superior endplate of L1, partially visualized, better evaluated on previous lumbar MRI. No other pathologic fracture  identified. Prominent tumor involvement of the right posterior elements noted at T5 (series 25, image 8). No other significant extra osseous or epidural tumor identified. Probable scattered rib involvement noted as well. Cord: Signal intensity within the thoracic spinal cord is within normal limits. Paraspinal and other soft tissues: Soft tissue edema noted within the right upper posterior back at the level of T4-5 related to prominent tumor burden involving the adjacent right T5 posterior elements (series 21, image 5). Paraspinous soft tissues demonstrate no other acute finding. Trace layering bilateral pleural effusions noted. Disc levels: No significant disc pathology seen within the thoracic spine. No other significant canal or foraminal stenosis at this time. IMPRESSION: 1. Abnormal marrow signal intensity throughout the thoracic spine, consistent with metastatic disease or myeloma. 2. Complete involvement of the T11 vertebral body with associated pathologic compression fracture, with 5 mm of retropulsed bone and/or tumor protruding into the spinal canal. Resultant mild spinal stenosis without frank cord compression or cord signal changes. 3. Additional prominent tumor involvement involving the right posterior elements of T5 with associated adjacent paraspinous edema. 4. No other significant epidural or extraosseous tumor identified. No significant disc pathology or stenosis. Electronically Signed   By: Jeannine Boga M.D.   On: 02/12/2020 22:52   MR LUMBAR SPINE WO CONTRAST  Result Date: 02/11/2020 CLINICAL DATA:  Low back pain with difficulty walking, persistently worsening. EXAM: MRI LUMBAR SPINE WITHOUT CONTRAST TECHNIQUE: Multiplanar, multisequence MR imaging of the lumbar spine was performed. No intravenous contrast was administered. COMPARISON:  None. FINDINGS:  Segmentation:  5 lumbar type vertebral bodies. Alignment:  Normal Vertebrae: Metastatic disease or myeloma throughout the region with  involvement each vertebral level from inferior T11 through S1. Extensive confluent disease replacing much of the marrow. Minor superior endplate deformity at L1 which could be at a fracture related to the underlying tumor or could be chronic. Extraosseous tumor extension at the S1 level involving the ventral epidural space. Lesion also seen within the right iliac bone. At the barely visible inferior T11 level, it appears that tumor bulges into the anterior aspect of the spinal canal. This was not studied completely. There is some potential that this could affect the distal thoracic cord. Conus medullaris and cauda equina: Conus extends to the T12-L1 level. Conus and cauda equina appear normal. Paraspinal and other soft tissues: Negative. No visible retroperitoneal adenopathy. Question focal aneurysm of the infrarenal abdominal aorta measuring up to 3.5 cm. This was not fully evaluated. Disc levels: No disc pathology at L2-3 or above. L3-4: Mild bulging of the disc.  No compressive stenosis. L5-S1: Chronic fusion across this disc space. Sufficient patency of the canal and foramina. As noted above, there is ventral epidural bulging of tumor at the S1 level but this does not cause visible neural compression. L4-5: Disc bulge. Mild facet and ligamentous hypertrophy. Mild bilateral lateral recess stenosis. 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 Bulging of tumor into the ventral spinal canal at the inferior T11 level, barely visible on this study. Complete spinal imaging recommended. Bulging of tumor at the S1 level, encroaching upon the ventral epidural space but without gross neural compression. Electronically Signed   By: Nelson Chimes M.D.   On: 02/11/2020 19:36   NM Pulmonary Perfusion  Result Date: 02/11/2020 CLINICAL DATA:  PE suspected, high prob Shortness of breath.  Weakness. EXAM: NUCLEAR MEDICINE PERFUSION LUNG SCAN  TECHNIQUE: Perfusion images were obtained in multiple projections after intravenous injection of radiopharmaceutical. Ventilation scans intentionally deferred if perfusion scan and chest x-ray adequate for interpretation during COVID 19 epidemic. RADIOPHARMACEUTICALS:  3.8 mCi Tc-23m MAA IV COMPARISON:  Chest radiograph earlier this day. FINDINGS: Lung perfusion is diffusely heterogeneous. There are no discrete wedge-shaped perfusion defects. IMPRESSION: Diffusely heterogeneous lung perfusion without discrete wedge-shaped perfusion defect. No scintigraphic evidence of pulmonary embolus. Electronically Signed   By: Keith Rake M.D.   On: 02/11/2020 16:50   US RENAL  Result Date: 02/11/2020 CLINICAL DATA:  Acute kidney injury EXAM: RENAL / URINARY TRACT ULTRASOUND COMPLETE COMPARISON:  Ultrasound 09/18/2017 FINDINGS: Right Kidney: Renal measurements: 9.1 by 5 x 5.2 cm = volume: 125 mL. Cortical echogenicity within normal limits. No hydronephrosis. 4.2 x 2.9 x 4.2 cm parapelvic cyst, previously 4.3 cm. Left Kidney: Renal measurements: 8.8 x 4.6 x 4.7 cm = volume: 100 mL. Echogenicity within normal limits. No mass or hydronephrosis visualized. Bladder: Appears normal for degree of bladder distention. Other: Bladder volume of 473 mL. Patient unable to void to get postvoid residual. IMPRESSION: 1. Negative for hydronephrosis. 2. Similar 4.3 cm right parapelvic cyst Electronically Signed   By: Donavan Foil M.D.   On: 02/11/2020 16:30   VAS [place] LOWER EXTREMITY VENOUS (DVT)  Result Date: 02/14/2020  Lower Venous DVTStudy Indications: Elevated ddimer.  Comparison Study: no prior Performing Technologist: Abram Sander RVS  Examination Guidelines: A complete evaluation includes B-mode imaging, spectral Doppler, color Doppler, and power Doppler as needed of all accessible portions of each vessel. Bilateral testing  is considered an integral part of a complete examination. Limited examinations for reoccurring  indications may be performed as noted. The reflux portion of the exam is performed with the patient in reverse Trendelenburg.  +---------+---------------+---------+-----------+----------+--------------+ RIGHT    CompressibilityPhasicitySpontaneityPropertiesThrombus Aging +---------+---------------+---------+-----------+----------+--------------+ CFV      Full           Yes      Yes                                 +---------+---------------+---------+-----------+----------+--------------+ SFJ      Full                                                        +---------+---------------+---------+-----------+----------+--------------+ FV Prox  Full                                                        +---------+---------------+---------+-----------+----------+--------------+ FV Mid   Full                                                        +---------+---------------+---------+-----------+----------+--------------+ FV DistalFull                                                        +---------+---------------+---------+-----------+----------+--------------+ PFV      Full                                                        +---------+---------------+---------+-----------+----------+--------------+ POP      Full           Yes      Yes                                 +---------+---------------+---------+-----------+----------+--------------+ PTV      Full                                                        +---------+---------------+---------+-----------+----------+--------------+ PERO     Full                                                        +---------+---------------+---------+-----------+----------+--------------+   +---------+---------------+---------+-----------+----------+--------------+ LEFT     CompressibilityPhasicitySpontaneityPropertiesThrombus Aging  +---------+---------------+---------+-----------+----------+--------------+ CFV  Full           Yes      Yes                                 +---------+---------------+---------+-----------+----------+--------------+ SFJ      Full                                                        +---------+---------------+---------+-----------+----------+--------------+ FV Prox  Full                                                        +---------+---------------+---------+-----------+----------+--------------+ FV Mid   Full                                                        +---------+---------------+---------+-----------+----------+--------------+ FV DistalFull                                                        +---------+---------------+---------+-----------+----------+--------------+ PFV      Full                                                        +---------+---------------+---------+-----------+----------+--------------+ POP      Full           Yes      Yes                                 +---------+---------------+---------+-----------+----------+--------------+ PTV      Full                                                        +---------+---------------+---------+-----------+----------+--------------+ PERO     Full                                                        +---------+---------------+---------+-----------+----------+--------------+     Summary: BILATERAL: - No evidence of deep vein thrombosis seen in the lower extremities, bilaterally. - No evidence of superficial venous thrombosis in the lower extremities, bilaterally. -   *See table(s) above for measurements and observations. Electronically signed by Deitra Mayo MD on 02/14/2020 at 4:18:52 AM.    Final     Labs:  CBC: Recent Labs    02/11/20 1249 02/12/20 0528 02/13/20 1032  WBC 11.1* 7.8 9.8  HGB 14.0 11.8* 12.2*  HCT 44.5 38.2* 38.9*  PLT 381 286 298     COAGS: No results for input(s): INR, APTT in the last 8760 hours.  BMP: Recent Labs    02/11/20 1249 02/12/20 0528 02/13/20 0530 02/13/20 1032  NA 141 143  --  140  K 4.6 5.0  --  4.5  CL 96* 105  --  100  CO2 25 30  --  24  GLUCOSE 150* 107*  --  105*  BUN 99* 71*  --  32*  CALCIUM 11.8* 10.8* 10.5* 10.3  CREATININE 2.52* 1.50*  --  1.07  GFRNONAA 24* 45*  --  >60  GFRAA 28* 52*  --  >60    LIVER FUNCTION TESTS: Recent Labs    02/11/20 1249 02/12/20 0528  BILITOT 0.8 0.5  AST 41 37  ALT 25 23  ALKPHOS 115 101  PROT 8.0 6.8  ALBUMIN 3.7 3.3*    TUMOR MARKERS: No results for input(s): AFPTM, CEA, CA199, CHROMGRNA in the last 8760 hours.  Assessment and Plan:  74 y.o, male inpatient. History of HTN, COPD on 2L home O2, CHF with a known RUL pulmonary nodule ( declined bronchoscopy in 2020). Patient presented to [facility] ED with generalized weakness and decreased appetite x  Days. Patient was found to be in AKI with elevated lactic acid and D-dimer.  CT abdomen pelvis from 7.30.21 reads 4.4 X 3.4 soft tissue mass destroys the right T5 pedicle and transverse process with involvement of the posterior right fifth rib.Team initially request was for a lung biopsy and evaluation of possible kyphoplasty. After review of the case IR Attending  Dr. Owens Shark recommends a T5 biopsy with review of a possible kyphoplasty when pathology has resulted. This was communicated directly to the Team who is in agreement with the plan of care.    NKDA. BUN 32 All other labs  are within acceptable parameters. Patient is on subcutaneous prophylactic dose of lovenox.  Patient is afebrile.  IR consulted for possible lung biopsy and kyphoplasty. Case has been reviewed and procedure approved by Dr. Pascal Lux. Recommendation to Team that patient have a T5 biopsy with discussion for possible kyphoplasty when pathology has resulted. Team and patient are in agreement with the plan of care.  Patient tentatively  scheduled for 8.2.21.  Team instructed to: Keep Patient to be NPO after midnight Hold prophylactic anticoagulation 24 hours prior to scheduled procedure.   IR will call patient when ready.  Risks and benefits of T5 biopsy was discussed with the patient and/or patient's family including, but not limited to bleeding, infection, damage to adjacent structures or low yield requiring additional tests.  All of the questions were answered and there is agreement to proceed.  Consent signed and in chart.      Thank you for this interesting consult.  I greatly enjoyed meeting George Frazier and look forward to participating in their care.  A copy of this report was sent to the requesting provider on this date.  Electronically Signed: Jacqualine Mau, NP 02/14/2020, 6:17 PM   I spent a total of 40 Minutes in face to face in clinical consultation, greater than 50% of which was counseling/coordinating care for T5 aspiration.

## 2020-02-14 NOTE — Progress Notes (Signed)
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  Discussed with bedside care team.  I met today with George Frazier, his daughter, and his nephew.  He reports that he is feeling a little better today.  Pain in his back seems to be better controlled since getting dose of steroids.  He still does not have an appetite.  We discussed care options moving forward including considerations we discussed yesterday of pursuing biopsy for further differentiation of likely cancer versus foregoing biopsy with likely recommendation to transition home with the support of hospice.  George Frazier reports that he does not want to undergo substantially invasive procedures nor does he want to undergo treatment that would have high burden of side effects and make him feel worse.  He understands that with his widespread disease, this is quite possibly a very advanced cancer and his prognosis may be very limited regardless of interventions moving forward.  He also understands that we would need to have biopsy to better classify and discuss options for treatment.  Family tells me that his brother had a bronchoscopy that resulted in collapsed lung and this is why he has been so resistant to consider this option.  He tells me that if there is the option for less invasive biopsy through interventional radiology this is something that he would consider.  He would also be open to talking with him about potential for kyphoplasty to help pain in his compression fracture site if this is an option.  He is not really interested in chemotherapy but seems to be open for consideration for immunotherapy depending upon results of biopsy.  If his biopsy is inconclusive or if it reveals disease that require treatment with agents that have high side effect profile, he reports he would likely want to go home with hospice rather than pursue any sort of disease modifying therapy.  We also  discussed potential for radiation for painful bony lesions and he states this is something he may consider.  -DNR/DNI -His #1 goal moving forward is to be able to spend time with his family and enjoy as best quality of life as he can. -He would like his daughter and nephew to work as a Warehouse manager in the event that he cannot make his own medical decisions.  He does not have HC POA paperwork established and his daughter is his decision-maker by default.  He will consider if this is something he would like to complete here in the hospital. -He would like evaluation by IR to see about potential for biopsy.  He is not interested in more invasive procedures such as bronchoscopy if lesions are not amenable to less invasive option such as needle biopsy.  He would also be open to evaluation by IR for potential kyphoplasty if they believe this would help his pain at a level of compression fracture. -Once biopsy results are back, he would be open to consideration likely for immunotherapy or radiation if these are likely to decrease his pain or add quality time to his life.  He wants to be very judicious in choosing options for care moving forward and wants to avoid interventions that may cause significant side effects or cause him to have poor quality of life. -He reports that his pain is currently better controlled since starting steroids.  He still has Dilaudid available as needed but has not required any doses overnight. -Palliative will continue to follow along.  Plan to check in again with him on Monday.  Start time: 1130 End time: 1210 Total time: 40 minutes  Greater than 50%  of this time was spent counseling and coordinating care related to the above assessment and plan.  Micheline Rough, MD [place] Team [phone]

## 2020-02-14 NOTE — Progress Notes (Signed)
PT Cancellation Note  Patient Details Name: George Frazier MRN: [id number] DOB: 1945/10/29   Cancelled Treatment:    Reason Eval/Treat Not Completed: Other (comment)PT will await GOC after neXT pALLIATIVE [place], PER CHART.    Claretha Cooper 02/14/2020, 11:53 AM [place] Pager [phone] Office [phone]

## 2020-02-15 NOTE — Progress Notes (Signed)
PROGRESS NOTE    George Frazier  FOY:[id number] DOB: [birth year] DOA: 02/11/2020 PCP: Patient, No Pcp Per    Chief Complaint  Patient presents with  . Weakness  . Shortness of Breath    Brief Narrative:  74 year old male with prior history of COPD on home oxygen, lung nodule, hypertension, chronic diastolic heart failure presents to ED for generalized weakness and decreased appetite.  Arrival to the ED patient was found to be in AKI, hypercalcemia, elevated lactic acid, D-dimer greater than 20.  Urine analysis was negative for infection.  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 Complete spinal imaging recommended. He was referred to [facility] for admission.  Bilateral lower extremity duplex is negative for DVT.  CT of the chest without contrast and abdomen and pelvis shows 10 mm spiculated pulmonary nodule in the right upper lobe and a 5 mm nodule in the right upper lobe of the lungs. Reviewed the results with the oncologist on call Dr. Marin Olp who recommended IV Decadron 20 mg once followed by 10 mg twice daily until family decides regarding aggresive treatment versus hospice.  Meanwhile myeloma panel ordered for further evaluation. Palliative meeting on 7/31, patient would like to be evaluated by IR for biopsy. He is not interested in more invasive procedures such as bronchoscopy if lesions are not amenable to less invasive options such as needle biopsy. He would also be open to evaluation by IR for potential kyphoplasty. 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  Patient seen and examined at bedside reports his pain is much better with steroids.  Assessment & Plan:   Principal Problem:   Hypotension Active  Problems:   COPD, group D, by GOLD 2017 classification ([place])   Chronic respiratory failure with hypoxia (HCC)   Hypertension   Hyperlipidemia   Solitary pulmonary nodule- R middle lobe- followed by pulm   Back pain   AKI (acute kidney injury) ([place])   Pressure injury of skin   Generalized weakness, back pain and decreased appetite Probably secondary to metastatic disease vs myeloma.  Myeloma panel ordered.  CT of the chest without contrast shows 2 pulmonary nodules , 10 mm and 5 mm nodule in size in the right upper lobe of the lung suspicious for primary bronchogenic carcinoma. Pt has a 40 year history of smoking. Further evaluation of the spine ordered and the results reviewed with the patient and her daughter. Discussed with on-call oncologist who recommended 20 mg of IV Decadron followed by 10 mg twice daily of IV Decadron while we wait for the family decision regarding aggressive management versus comfort approach. pSA ordered and wnl. Palliative care meeting yesterday. , patient would like to be evaluated by IR for biopsy. He is not interested in more invasive procedures such as bronchoscopy if lesions are not amenable to less invasive options such as needle biopsy. He would also be open to evaluation by IR for potential kyphoplasty. 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  PTH is low at 13. Calcium is 10.5.    Essential hypertension Well controlled.  Hypotension resolved with IV fluids.    AKI:  Probably sec to dehydration , poor oral intake and poor renal perfusion from hypotension  Improving with IV fluids.   creatinine at baseline.   Hyperlipidemia: resume statin.     Pressure Injury present on admission.  Pressure Injury 02/11/20 Sacrum Left;Right Stage 1 -  Intact skin with non-blanchable redness of a localized area usually  over a bony prominence. (Active)  02/11/20 1816  Location: Sacrum  Location Orientation: Left;Right  Staging: Stage 1 -  Intact skin with non-blanchable redness of a localized area usually over a bony prominence.  Wound Description (Comments):   Present on Admission: Yes   Wound care consulted and recommendations given.    Mild anemia:  Stable hemoglobin around 12. Recheck labs in am.     Elevated lactic acid:  Resolved with IV fluids.    Chronic respiratory failure with hypoxia 2 L of nasal cannula oxygen. No wheezing heard on exam, resume duonebs as needed.  continue to monitor.    Solitary pulmonary nodule 10 mm spiculated pulmonary nodule seen in the right upper lobe suspicious for primary bronchogenic carcinoma.    Discussed the results of the MRI of the lumbar spine with the patient's daughter she wanted to talk to the rest of the family members and the patient and decide whether to pursue aggressive route vs pain control. She would like  to talk to palliative care for goals of care. palliative care meeting today , patient would like to be evaluated by IR for biopsy. He is not interested in more invasive procedures such as bronchoscopy if lesions are not amenable to less invasive options such as needle biopsy. He would also be open to evaluation by IR for potential kyphoplasty. 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  Code Status: DNR. Family Communication: discussed with daughter over the phone Disposition:   Status is: Inpatient  Remains inpatient appropriate because:Ongoing diagnostic testing needed not appropriate for outpatient work up   Dispo: The patient is from: Home              Anticipated d/c is to: pending.               Anticipated d/c date is: > 3 days              Patient currently is not medically stable to d/c.       Consultants:    Palliative care.   Procedures:  MRI Lumbar spine.  Antimicrobials: none.   Subjective: Pain well controlled. No chest pain or sob.    Objective: Vitals:   02/14/20 2052 02/15/20 0450 02/15/20 0814 02/15/20 1242  BP: (!) 143/93 (!) 138/84  (!) 150/93  Pulse: 98 92  94  Resp: 20 20  22   Temp: 98.1 F (36.7 C) 98.3 F (36.8 C)  98.7 F (37.1 C)  TempSrc: Oral Oral  Oral  SpO2: 98% 99% 97% 98%  Weight:      Height:        Intake/Output Summary (Last 24 hours) at 02/15/2020 1420 Last data filed at 02/15/2020 1000 Gross per 24 hour  Intake 2248.01 ml  Output 700 ml  Net 1548.01 ml   Filed Weights   02/11/20 1151  Weight: 78.5 kg    Examination:  General exam: Alert and comfortable, not in any distress,  Respiratory system: Diminished air entry at bases, no wheezing or rhonchi, on 2 L ofNC oxygen.  Cardiovascular system: S1-S2 heard regular rate rhythm, no JVD, no pedal edema Gastrointestinal system: Abdomen is soft, nontender, nondistended, bowel sounds Central nervous system: Alert and oriented, able to move all extremities  Extremities: No cyanosis or clubbing. Skin: Stage I sacral decubitus ulcer Psychiatry: Mood is appropriate   Data Reviewed: I have personally reviewed following labs and imaging studies  CBC: Recent Labs  Lab 02/11/20 1249 02/12/20 0528 02/13/20 1032  WBC 11.1* 7.8 9.8  NEUTROABS 9.1*  --   --   HGB 14.0 11.8* 12.2*  HCT 44.5 38.2* 38.9*  MCV 94.5 96.7 95.6  PLT 381 286 998    Basic Metabolic Panel: Recent Labs  Lab 02/11/20 1249 02/12/20 0528 02/13/20 0530 02/13/20 1032  NA 141 143  --  140  K 4.6 5.0  --  4.5  CL 96* 105  --  100  CO2 25 30  --  24  GLUCOSE 150* 107*  --  105*  BUN 99* 71*  --  32*  CREATININE 2.52* 1.50*  --  1.07  CALCIUM 11.8* 10.8* 10.5* 10.3    GFR: Estimated Creatinine Clearance: 66.5 mL/min (by C-G formula based on SCr of 1.07 mg/dL).  Liver Function Tests: Recent Labs  Lab 02/11/20 1249  02/12/20 0528  AST 41 37  ALT 25 23  ALKPHOS 115 101  BILITOT 0.8 0.5  PROT 8.0 6.8  ALBUMIN 3.7 3.3*    CBG: No results for input(s): GLUCAP in the last 168 hours.   Recent Results (from the past 240 hour(s))  SARS Coronavirus 2 by RT PCR (hospital order, performed in [facility] hospital lab) Nasopharyngeal Nasopharyngeal Swab     Status: None   Collection Time: 02/11/20  1:24 PM   Specimen: Nasopharyngeal Swab  Result Value Ref Range Status   SARS Coronavirus 2 NEGATIVE NEGATIVE Final    Comment: (NOTE) SARS-CoV-2 target nucleic acids are NOT DETECTED.  The SARS-CoV-2 RNA is generally detectable in upper and lower respiratory specimens during the acute phase of infection. The lowest concentration of SARS-CoV-2 viral copies this assay can detect is 250 copies / mL. A negative result does not preclude SARS-CoV-2 infection and should not be used as the sole basis for treatment or other patient management decisions.  A negative result may occur with improper specimen collection / handling, submission of specimen other than nasopharyngeal swab, presence of viral mutation(s) within the areas targeted by this assay, and inadequate number of viral copies (<250 copies / mL). A negative result must be combined with clinical observations, patient history, and epidemiological information.  Fact Sheet for Patients:   StrictlyIdeas.no  Fact Sheet for Healthcare Providers: BankingDealers.co.za  This test is not yet approved or  cleared by the [place] FDA and has been authorized for detection and/or diagnosis of SARS-CoV-2 by FDA under an Emergency Use Authorization (EUA).  This EUA will remain in effect (meaning this test can be used) for the duration of the COVID-19 declaration under Section 564(b)(1) of the Act, 21 U.S.C. section 360bbb-3(b)(1), unless the authorization is terminated or revoked sooner.  Performed at [facility], [place] [address], [place], [place] [address]   Urine culture     Status: Abnormal   Collection Time: 02/11/20  8:30 PM   Specimen: Urine, Clean Catch  Result Value Ref Range Status   Specimen Description   Final    URINE,  CLEAN CATCH Performed at [facility], [place] [address], [place], [place] [address]    Special Requests   Final    NONE Performed at [facility], [place] [address], [place], [place] [address]    Culture (A)  Final    <10,000 COLONIES/mL INSIGNIFICANT GROWTH Performed at [place] [address], [place],  [address]    Report Status 02/12/2020 FINAL  Final         Radiology Studies: VAS [place] LOWER EXTREMITY VENOUS (DVT)  Result Date: 02/14/2020  Lower Venous DVTStudy Indications: Elevated ddimer.  Comparison Study: no prior Performing Technologist: Abram Sander RVS  Examination Guidelines: A complete evaluation includes B-mode imaging, spectral Doppler, color Doppler, and power Doppler as needed of all accessible portions of each vessel. Bilateral testing is considered an integral part of a complete examination. Limited examinations for reoccurring indications may be performed as noted. The reflux portion of the exam is performed with the patient in reverse Trendelenburg.  +---------+---------------+---------+-----------+----------+--------------+ RIGHT    CompressibilityPhasicitySpontaneityPropertiesThrombus Aging +---------+---------------+---------+-----------+----------+--------------+ CFV      Full           Yes      Yes                                 +---------+---------------+---------+-----------+----------+--------------+ SFJ      Full                                                        +---------+---------------+---------+-----------+----------+--------------+ FV Prox  Full                                                         +---------+---------------+---------+-----------+----------+--------------+ FV Mid   Full                                                        +---------+---------------+---------+-----------+----------+--------------+ FV DistalFull                                                        +---------+---------------+---------+-----------+----------+--------------+ PFV      Full                                                        +---------+---------------+---------+-----------+----------+--------------+ POP      Full           Yes      Yes                                 +---------+---------------+---------+-----------+----------+--------------+  PTV      Full                                                        +---------+---------------+---------+-----------+----------+--------------+ PERO     Full                                                        +---------+---------------+---------+-----------+----------+--------------+   +---------+---------------+---------+-----------+----------+--------------+ LEFT     CompressibilityPhasicitySpontaneityPropertiesThrombus Aging +---------+---------------+---------+-----------+----------+--------------+ CFV      Full           Yes      Yes                                 +---------+---------------+---------+-----------+----------+--------------+ SFJ      Full                                                        +---------+---------------+---------+-----------+----------+--------------+ FV Prox  Full                                                        +---------+---------------+---------+-----------+----------+--------------+ FV Mid   Full                                                        +---------+---------------+---------+-----------+----------+--------------+ FV DistalFull                                                         +---------+---------------+---------+-----------+----------+--------------+ PFV      Full                                                        +---------+---------------+---------+-----------+----------+--------------+ POP      Full           Yes      Yes                                 +---------+---------------+---------+-----------+----------+--------------+ PTV      Full                                                        +---------+---------------+---------+-----------+----------+--------------+  PERO     Full                                                        +---------+---------------+---------+-----------+----------+--------------+     Summary: BILATERAL: - No evidence of deep vein thrombosis seen in the lower extremities, bilaterally. - No evidence of superficial venous thrombosis in the lower extremities, bilaterally. -   *See table(s) above for measurements and observations. Electronically signed by Deitra Mayo MD on 02/14/2020 at 4:18:52 AM.    Final         Scheduled Meds: . atorvastatin  80 mg Oral q1800  . dexamethasone (DECADRON) injection  10 mg Intravenous Q12H  . enoxaparin (LOVENOX) injection  40 mg Subcutaneous Q24H  . feeding supplement (ENSURE ENLIVE)  237 mL Oral TID BM  . fluticasone furoate-vilanterol  1 puff Inhalation Daily   And  . umeclidinium bromide  1 puff Inhalation Daily   Continuous Infusions: . sodium chloride 75 mL/hr at 02/15/20 1000     LOS: 4 days        Hosie Poisson, MD [facility]   To contact the attending provider between 7A-7P or the covering provider during after hours 7P-7A, please log into the web site [email or web address] and access using universal [place] password for that web site. If you do not have the password, please call the hospital operator.  02/15/2020, 2:20 PM

## 2020-02-15 NOTE — Progress Notes (Addendum)
Daily Progress Note   Patient Name: George Frazier       Date: 02/15/2020 DOB: 1946-04-08  Age: 74 y.o. MRN#: [id number] Attending Physician: Hosie Poisson, MD Primary Care Physician: Patient, No Pcp Per Admit Date: 02/11/2020  Reason for Consultation/Follow-up: Establishing goals of care and Pain control  Subjective: I met with Mr. George Frazier and we discussed again regarding pain management.  He continues to report pain with movement, but feels that his pain in improved from admission.  We also reviewed his goals and plan for biopsy by IR that is tentatively scheduled for tomorrow.  Length of Stay: 4  Current Medications: Scheduled Meds:  . atorvastatin  80 mg Oral q1800  . dexamethasone (DECADRON) injection  10 mg Intravenous Q12H  . enoxaparin (LOVENOX) injection  40 mg Subcutaneous Q24H  . feeding supplement (ENSURE ENLIVE)  237 mL Oral TID BM  . fluticasone furoate-vilanterol  1 puff Inhalation Daily   And  . umeclidinium bromide  1 puff Inhalation Daily    Continuous Infusions: . sodium chloride 75 mL/hr at 02/15/20 1000    PRN Meds: acetaminophen **OR** acetaminophen, albuterol, hydrALAZINE, HYDROmorphone (DILAUDID) injection, ondansetron **OR** ondansetron (ZOFRAN) IV, senna-docusate  Physical Exam         General: Alert, awake, in no acute distress.  HEENT: No bruits, no goiter, no JVD Heart: Regular rate and rhythm. No murmur appreciated. Lungs: Good air movement, clear Abdomen: Soft, nontender, nondistended, positive bowel sounds.  Ext: No significant edema Skin: Warm and dry Neuro: Grossly intact, nonfocal.  Vital Signs: BP (!) 150/93 (BP Location: Right Arm)   Pulse 94   Temp 98.7 F (37.1 C) (Oral)   Resp 22   Ht 6' (1.829 m)   Wt 78.5 kg   SpO2 98%    BMI 23.46 kg/m  SpO2: SpO2: 98 % O2 Device: O2 Device: Nasal Cannula O2 Flow Rate: O2 Flow Rate (L/min): 2 L/min  Intake/output summary:   Intake/Output Summary (Last 24 hours) at 02/15/2020 1514 Last data filed at 02/15/2020 1400 Gross per 24 hour  Intake 2014.43 ml  Output 700 ml  Net 1314.43 ml   LBM: Last BM Date: 02/14/20 Baseline Weight: Weight: 78.5 kg Most recent weight: Weight: 78.5 kg       Palliative Assessment/Data:    Flowsheet Rows  Most Recent Value  Intake Tab  Referral Department Hospitalist  Unit at Time of Referral Med/Surg Unit  Palliative Care Primary Diagnosis Cancer  Date Notified 02/12/20  Date of Admission 02/11/20  Date first seen by Palliative Care 02/14/20  # of days Palliative referral response time 2 Day(s)  # of days IP prior to Palliative referral 1  Clinical Assessment  Palliative Performance Scale Score 40%  Psychosocial & Spiritual Assessment  Palliative Care Outcomes  Patient/Family meeting held? Yes  Who was at the meeting? patient, daughter, nephew      Patient Active Problem List   Diagnosis Date Noted  . Pressure injury of skin 02/12/2020  . Hypotension 02/11/2020  . Back pain 02/11/2020  . AKI (acute kidney injury) ([place]) 02/11/2020  . AAA (abdominal aortic aneurysm) without rupture ([place]) 09/15/2019  . H/O noncompliance with medical treatment, presenting hazards to health 09/04/2019  . History of prediabetes-  A1c 6.1 in December 2018 12/23/2018  . Solitary pulmonary nodule- R middle lobe- followed by pulm 08/20/2018  . Accelerated hypertension 08/08/2018  . Hyperlipidemia 12/13/2017  . Vitamin D insufficiency 12/13/2017  . Physical deconditioning 11/15/2017  . Hypertension 11/06/2017  . Stopped smoking 2008 with greater than 40 pack year history 11/06/2017  . Chronic fatigue 11/06/2017  . Dyspnea on minimal exertion-likely due to COPD 11/06/2017  . Chronic respiratory failure with hypoxia ([place]) 09/17/2017  . CAP  (community acquired pneumonia) 07/14/2017  . COPD, group D, by GOLD 2017 classification ([place]) 07/14/2017  . Heart failure ([place]) 07/14/2017    Palliative Care Assessment & Plan   Recommendations/Plan: - Cancer related pain:  Improved on steroids per patient report.  Continue same regimen for now.  Consideration for possible interventions depending on results of biopsy. - GOC: Overall goal is to focus on interventions to add quality time to his life.  Plan for biopsy of T5 lesion by IR Monday.  He will need to be evaluated by PT for recommendations on safe discharge.  Pain is better, but not sure if he will be able to manage at home vs needing course of rehab if he does not elect home hospice.  Nephew expressed concern for him being able to be at home with functional status he had prior to admission.  Code Status:    Code Status Orders  (From admission, onward)         Start     Ordered   02/11/20 1710  Do not attempt resuscitation (DNR)  Continuous       Question Answer Comment  In the event of cardiac or respiratory ARREST Do not call a "code blue"   In the event of cardiac or respiratory ARREST Do not perform Intubation, CPR, defibrillation or ACLS   In the event of cardiac or respiratory ARREST Use medication by any route, position, wound care, and other measures to relive pain and suffering. May use oxygen, suction and manual treatment of airway obstruction as needed for comfort.      02/11/20 1713        Code Status History    Date Active Date Inactive Code Status Order ID Comments User Context   08/08/2018 2207 08/10/2018 1708 Full Code [id number]  Bonnielee Haff, MD Inpatient   09/17/2017 1847 09/21/2017 1626 Full Code [id number]  Evans Lance, MD ED   07/14/2017 2049 07/18/2017 1847 DNR [id number]  Karmen Bongo, MD Inpatient   Advance Care Planning Activity       Prognosis:   Unable to  determine, but if he forgos therapy, I believe that his prognosis would be less than  6 months and he should qualify for hospice if so desired.  Discharge Planning:  To Be Determined  Care plan was discussed with patient  Thank you for allowing the Palliative Medicine Team to assist in the care of this patient.   Total Time 20 Prolonged Time Billed No      Greater than 50%  of this time was spent counseling and coordinating care related to the above assessment and plan.  Micheline Rough, MD  Please contact Palliative Medicine Team phone at [phone] for questions and concerns.

## 2020-02-16 ENCOUNTER — Encounter (HOSPITAL_COMMUNITY): Payer: Self-pay | Admitting: Internal Medicine

## 2020-02-16 ENCOUNTER — Inpatient Hospital Stay (HOSPITAL_COMMUNITY): Payer: Medicare Other

## 2020-02-16 LAB — CBC
HCT: 37.7 % — ABNORMAL LOW (ref 39.0–52.0)
HCT: 39.5 % (ref 39.0–52.0)
Hemoglobin: 11.8 g/dL — ABNORMAL LOW (ref 13.0–17.0)
Hemoglobin: 12.2 g/dL — ABNORMAL LOW (ref 13.0–17.0)
MCH: 29.5 pg (ref 26.0–34.0)
MCH: 29.7 pg (ref 26.0–34.0)
MCHC: 31.3 g/dL (ref 30.0–36.0)
MCHC: 30.9 g/dL (ref 30.0–36.0)
MCV: 94.3 fL (ref 80.0–100.0)
MCV: 96.1 fL (ref 80.0–100.0)
Platelets: 351 10*3/uL (ref 150–400)
Platelets: 322 10*3/uL (ref 150–400)
RBC: 4 MIL/uL — ABNORMAL LOW (ref 4.22–5.81)
RBC: 4.11 MIL/uL — ABNORMAL LOW (ref 4.22–5.81)
RDW: 13 % (ref 11.5–15.5)
RDW: 13.1 % (ref 11.5–15.5)
WBC: 11.7 10*3/uL — ABNORMAL HIGH (ref 4.0–10.5)
WBC: 11.5 10*3/uL — ABNORMAL HIGH (ref 4.0–10.5)
nRBC: 0 % (ref 0.0–0.2)
nRBC: 0 % (ref 0.0–0.2)

## 2020-02-16 LAB — COMPREHENSIVE METABOLIC PANEL
ALT: 48 U/L — ABNORMAL HIGH (ref 0–44)
AST: 73 U/L — ABNORMAL HIGH (ref 15–41)
Albumin: 3.1 g/dL — ABNORMAL LOW (ref 3.5–5.0)
Alkaline Phosphatase: 89 U/L (ref 38–126)
Anion gap: 11 (ref 5–15)
BUN: 41 mg/dL — ABNORMAL HIGH (ref 8–23)
CO2: 26 mmol/L (ref 22–32)
Calcium: 9.5 mg/dL (ref 8.9–10.3)
Chloride: 106 mmol/L (ref 98–111)
Creatinine, Ser: 1 mg/dL (ref 0.61–1.24)
GFR calc Af Amer: >60 mL/min (ref >60)
GFR calc non Af Amer: >60 mL/min (ref >60)
Glucose, Bld: 117 mg/dL — ABNORMAL HIGH (ref 70–99)
Potassium: 4.2 mmol/L (ref 3.5–5.1)
Sodium: 143 mmol/L (ref 135–145)
Total Bilirubin: 0.6 mg/dL (ref 0.3–1.2)
Total Protein: 6.2 g/dL — ABNORMAL LOW (ref 6.5–8.1)

## 2020-02-16 IMAGING — CT CT BIOPSY
1 of 3 series · 15 of 28 positions shown, 19 images · non-contrast
Comparison: none

INDICATION: 74-year-old male with multifocal lytic osseous lesions concerning
for metastatic disease. Primary malignancy is uncertain at this
time.

[Series 2: i-spiral 5.0 br40 · axial · 0.98mm/px · z∈[+1524,+1622]mm · 15 of 32 slices shown, 19 images]
[im 2/32  mediastinal]
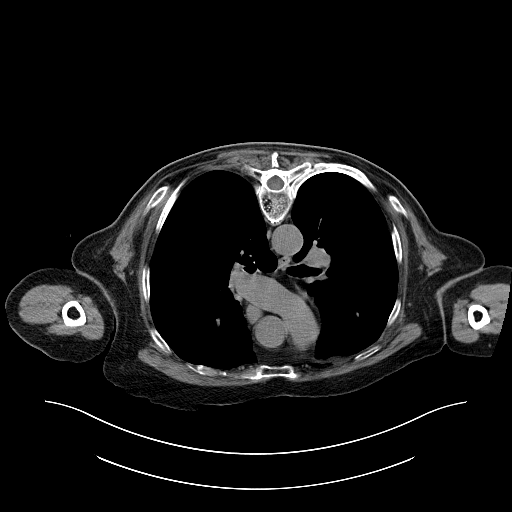
[im 2/32  lung]
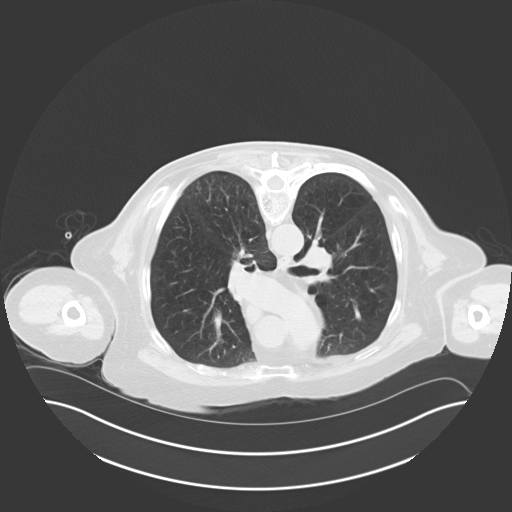
[im 5/32  lung]
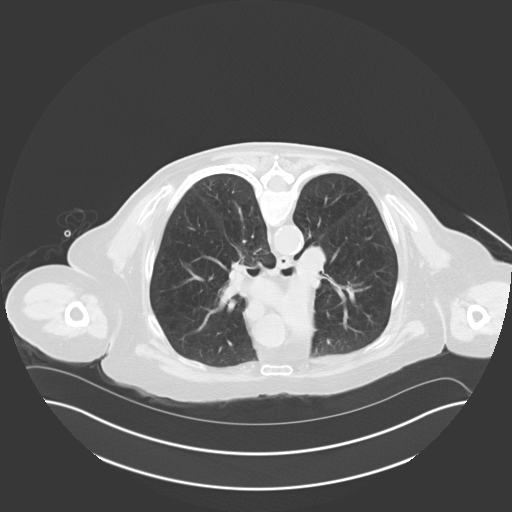
[im 6/32  lung]
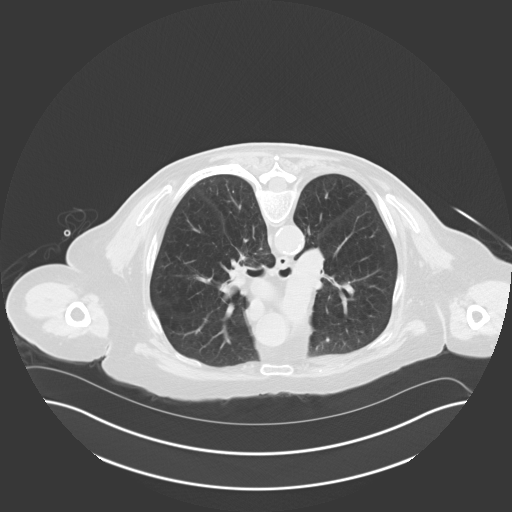
[im 8/32  lung]
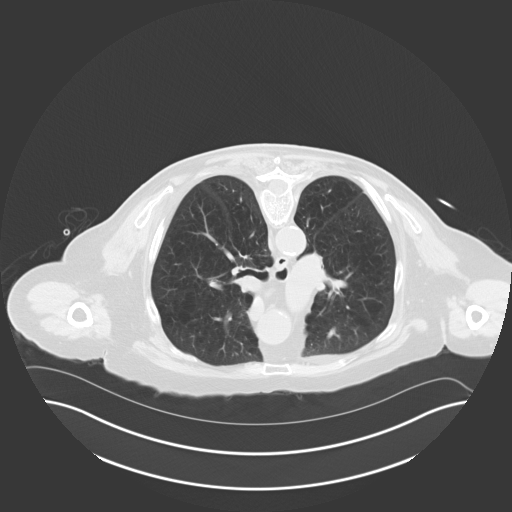
[im 11/32  mediastinal]
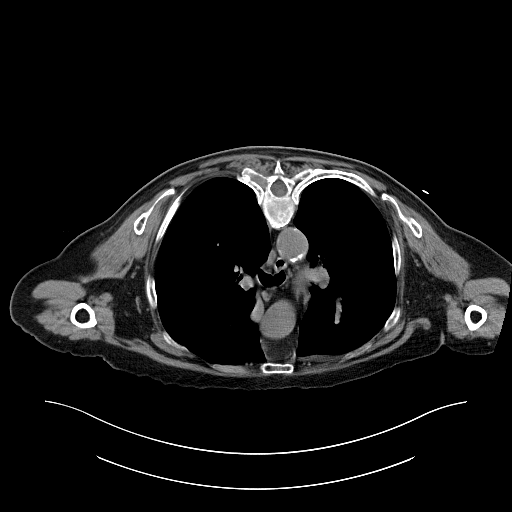
[im 11/32  lung]
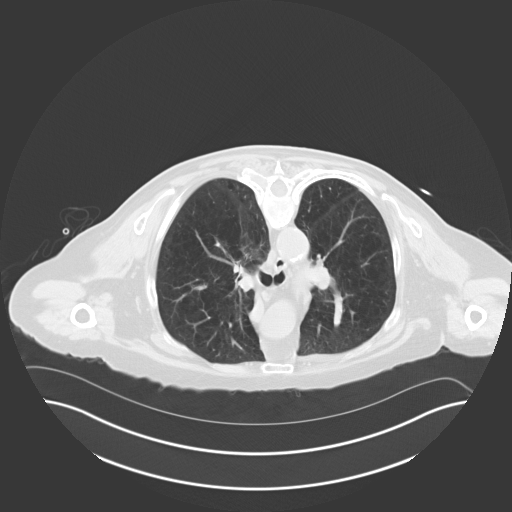
[im 12/32  lung]
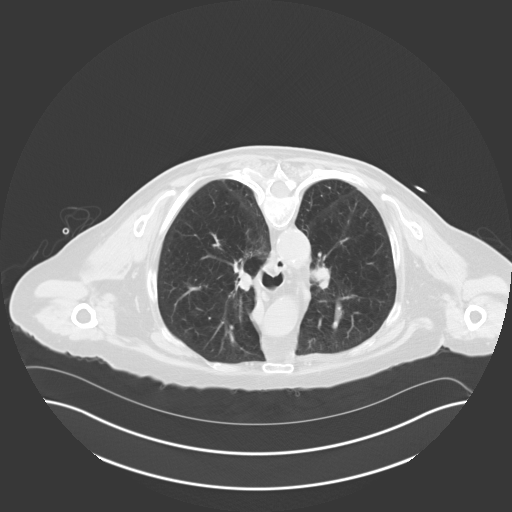
[im 14/32  lung]
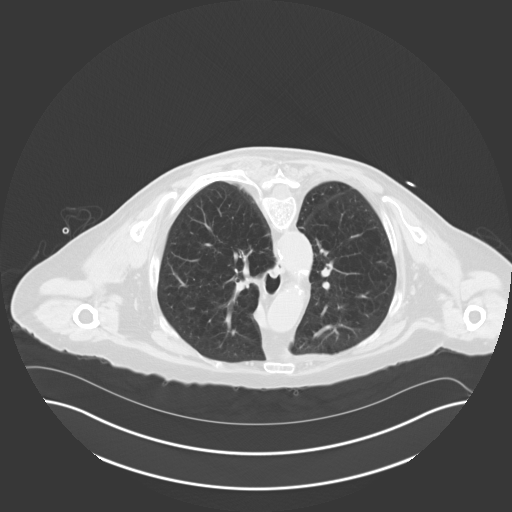
[im 17/32  lung]
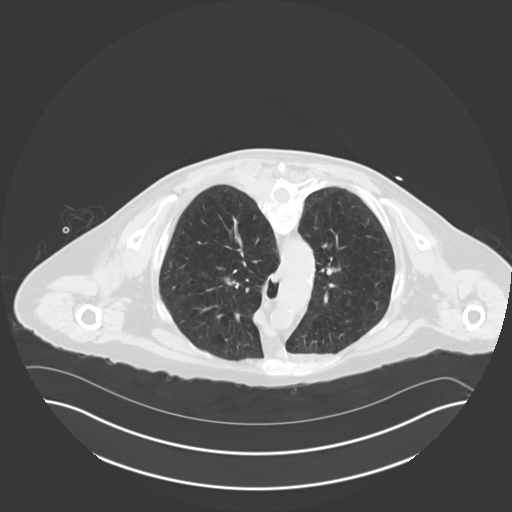
[im 18/32  mediastinal]
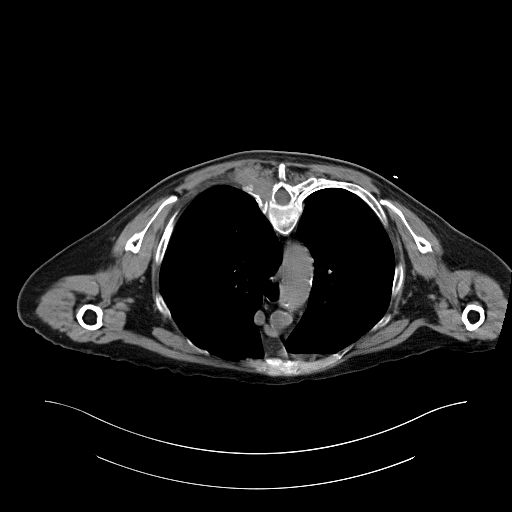
[im 18/32  lung]
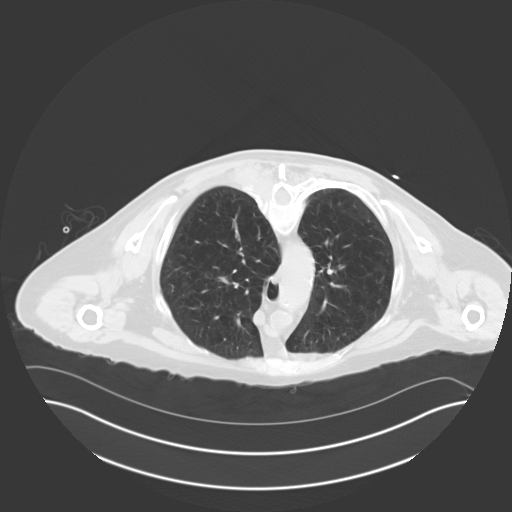
[im 20/32  lung]
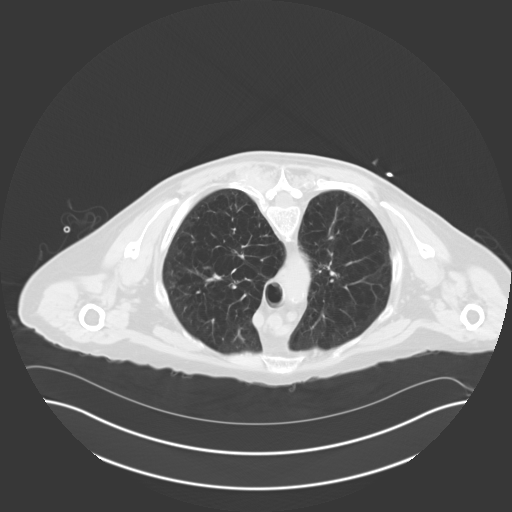
[im 23/32  lung]
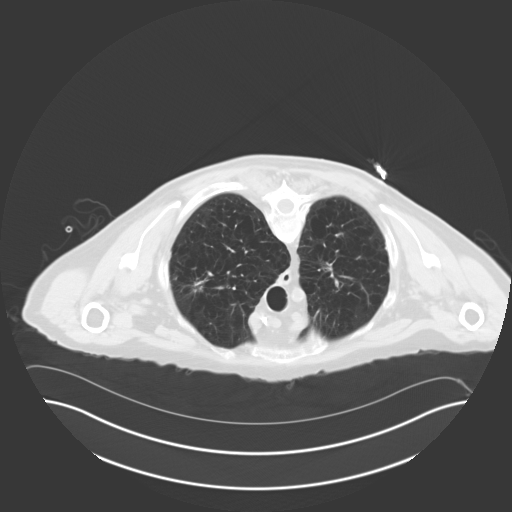
[im 24/32  lung]
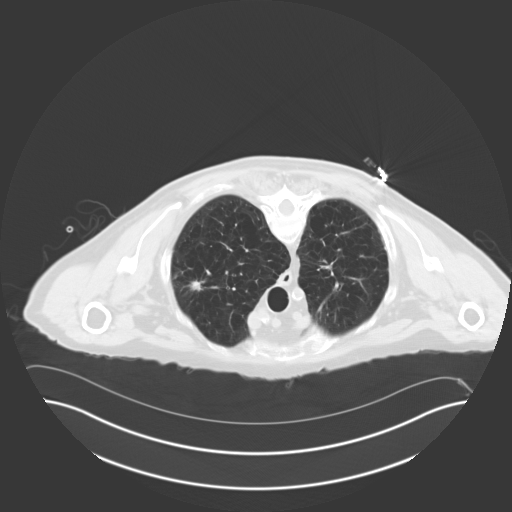
[im 26/32  mediastinal]
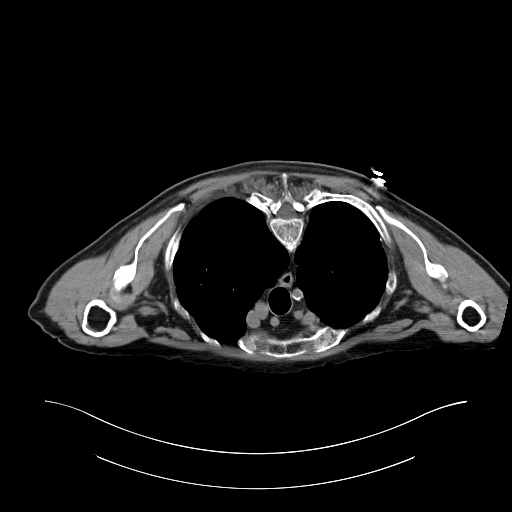
[im 26/32  lung]
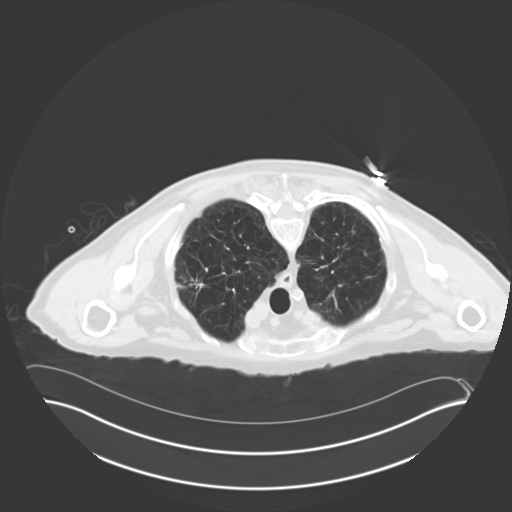
[im 29/32  lung]
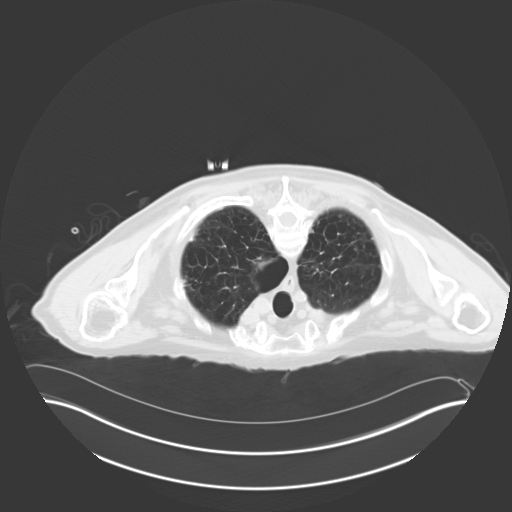
[im 30/32  lung]
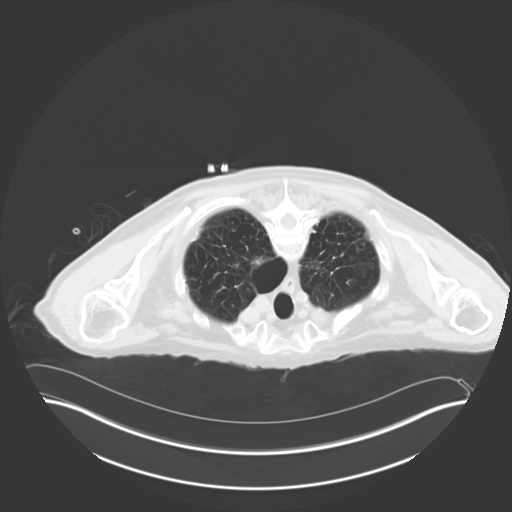

[15 of 28 positions shown; findings below may reference images not displayed]

EXAM:
CT-guided bone biopsy

MEDICATIONS:
None.

ANESTHESIA/SEDATION:
Moderate (conscious) sedation was employed during this procedure. A
total of Versed 2 mg and Fentanyl 100 mcg was administered
intravenously.

Moderate Sedation Time: 13 minutes. The patient's level of
consciousness and vital signs were monitored continuously by
radiology nursing throughout the procedure under my direct
supervision.

FLUOROSCOPY TIME:  None

COMPLICATIONS:
None immediate.

PROCEDURE:
Informed written consent was obtained from the patient after a
thorough discussion of the procedural risks, benefits and
alternatives. All questions were addressed. Maximal Sterile Barrier
Technique was utilized including caps, mask, sterile gowns, sterile
gloves, sterile drape, hand hygiene and skin antiseptic. A timeout
was performed prior to the initiation of the procedure.

The patient was placed in the prone position. A planning axial CT
imaging was obtained. The soft tissue mass emanating from the
posterior aspect of the right fifth rib and adjacent T5 vertebral
body was successfully identified. A suitable skin entry site was
selected and marked. Sterile prep and drape was then performed using
chlorhexidine skin prep. Local anesthesia was attained by
infiltration with 1% lidocaine. A small dermatotomy was made. Under
intermittent CT guidance, a 17 gauge introducer needle was advanced
into the margin of the mass. Multiple 18 gauge core biopsies were
then obtained using the JUSKEVIC automated biopsy device. Biopsy
specimens were placed in formalin and delivered to pathology for
further analysis.

The biopsy device was removed. Hemostasis was attained by gentle
manual pressure. The patient tolerated the procedure well.
IMPRESSION: Technically successful CT-guided core biopsy of T5 paraspinal mass.

## 2020-02-16 MED ORDER — MIDAZOLAM HCL 2 MG/2ML IJ SOLN
INTRAMUSCULAR | Status: AC | PRN
  Administered 2020-02-16 (×2): 1 mg via INTRAVENOUS

## 2020-02-16 MED ORDER — MIDAZOLAM HCL 2 MG/2ML IJ SOLN
INTRAMUSCULAR | Status: AC
  Filled 2020-02-16: qty 4

## 2020-02-16 MED ORDER — FLUMAZENIL 0.5 MG/5ML IV SOLN
INTRAVENOUS | Status: AC
  Filled 2020-02-16: qty 5

## 2020-02-16 MED ORDER — FENTANYL CITRATE (PF) 100 MCG/2ML IJ SOLN
INTRAMUSCULAR | Status: AC | PRN
  Administered 2020-02-16 (×2): 50 ug via INTRAVENOUS

## 2020-02-16 MED ORDER — FENTANYL CITRATE (PF) 100 MCG/2ML IJ SOLN
INTRAMUSCULAR | Status: AC
  Filled 2020-02-16: qty 4

## 2020-02-16 MED ORDER — NALOXONE HCL 0.4 MG/ML IJ SOLN
INTRAMUSCULAR | Status: AC
  Filled 2020-02-16: qty 1

## 2020-02-16 NOTE — Progress Notes (Signed)
PT Cancellation Note  Patient Details Name: George Frazier MRN: [id number] DOB: July 16, 1946   Cancelled Treatment:    Reason Eval/Treat Not Completed: Patient declined, no reason specified Discussed physical therapy with pt, and mobility and functional status to determine d/c recommendations.  Pt very pleasant and eventually agreeable however he reports his pain is finally under control right now, and he fears moving at this time.  Pt to have biopsy today as well.  Will check back as schedule permits.   Davionna Blacksher,KATHrine E 02/16/2020, 10:15 AM Arlyce Dice, DPT Acute Rehabilitation Services Pager: [phone] Office: [phone]

## 2020-02-16 NOTE — Progress Notes (Signed)
MEDICATION-RELATED CONSULT NOTE   IR Procedure Consult - Anticoagulant/Antiplatelet PTA/Inpatient Med List Review by Pharmacist    Procedure: CT guided bx of T5 paraspinal mass    Completed: 8/2 @ 5615  Post-Procedural bleeding risk per IR MD assessment:  low  Antithrombotic medications on inpatient or PTA profile prior to procedure:    Enoxaparin 40mg  SQ q24h   Recommended restart time per IR Post-Procedure Guidelines:   Day 0, at least 4 hours or at next standard dose interval  Other considerations:      Plan:     Enoxaparin 40mg  SQ q24h, scheduled for 2000, continue as ordered  Dolly Rias RPh 02/16/2020, 1:34 PM

## 2020-02-16 NOTE — Progress Notes (Signed)
PROGRESS NOTE    George Frazier  TMH:[id number] DOB: 01-23-1946 DOA: 02/11/2020 PCP: Patient, No Pcp Per    Chief Complaint  Patient presents with  . Weakness  . Shortness of Breath    Brief Narrative:  74 year old male with prior history of COPD on home oxygen, lung nodule, hypertension, chronic diastolic heart failure presents to ED for generalized weakness and decreased appetite.  Arrival to the ED patient was found to be in AKI, hypercalcemia, elevated lactic acid, D-dimer greater than 20.  Urine analysis was negative for infection.  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 Complete spinal imaging recommended. He was referred to [facility] for admission.  Bilateral lower extremity duplex is negative for DVT.  CT of the chest without contrast and abdomen and pelvis shows 10 mm spiculated pulmonary nodule in the right upper lobe and a 5 mm nodule in the right upper lobe of the lungs. Reviewed the results with the oncologist on call Dr. Marin Olp who recommended IV Decadron 20 mg once followed by 10 mg twice daily until family decides regarding aggresive treatment versus hospice.  Meanwhile myeloma panel ordered for further evaluation. Palliative meeting on 7/31, patient would like to be evaluated by IR for biopsy. He is not interested in more invasive procedures such as bronchoscopy if lesions are not amenable to less invasive options such as needle biopsy. He would also be open to evaluation by IR for potential kyphoplasty. IR consulted and underwnt for CT guided Bx of paraspinal mass adjacent to the T5 vertebral body on Monday, 8/2, sent to pathology .   Seen and examined at bedside reports his pain is controlled at this time with medications.  No new complaints at this time.  Assessment & Plan:     Principal Problem:   Hypotension Active Problems:   COPD, group D, by GOLD 2017 classification ([place])   Chronic respiratory failure with hypoxia (HCC)   Hypertension   Hyperlipidemia   Solitary pulmonary nodule- R middle lobe- followed by pulm   Back pain   AKI (acute kidney injury) ([place])   Pressure injury of skin   Generalized weakness, back pain and decreased appetite Probably secondary to metastatic disease vs myeloma.  Myeloma panel ordered and is pending..  CT of the chest without contrast shows 2 pulmonary nodules , 10 mm and 5 mm nodule in size in the right upper lobe of the lung suspicious for primary bronchogenic carcinoma. Pt has a 40 year history of smoking. Further evaluation of the spine ordered and the results reviewed with the patient and her daughter. Discussed with on-call oncologist who recommended 20 mg of IV Decadron followed by 10 mg twice daily of IV Decadron while we wait for the family decision regarding aggressive management versus comfort approach. pSA ordered and wnl. Palliative care meeting on 02/14/2020, patient would like to be evaluated by IR for biopsy. He is not interested in more invasive procedures such as bronchoscopy if lesions are not amenable to less invasive options such as needle biopsy. He would also be open to evaluation by IR for potential kyphoplasty. 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  Meanwhile continue with IV Decadron and await myeloma  panel results and PT evaluation.    Hypercalcemia;  Probably from malignancy vs dehydration.  PTH is low at 13. Calcium is 10.5.    Essential hypertension Suboptimally controlled probably secondary to back pain. As needed hydralazine will be added.  Hypotension resolved with IV fluids.    AKI:  Probably sec to dehydration , poor oral intake and poor renal perfusion from  hypotension  Improving with IV fluids.   creatinine at baseline.   Hyperlipidemia: resume statin.     Pressure Injury present on admission.  Pressure Injury 02/11/20 Sacrum Left;Right Stage 1 -  Intact skin with non-blanchable redness of a localized area usually over a bony prominence. (Active)  02/11/20 1816  Location: Sacrum  Location Orientation: Left;Right  Staging: Stage 1 -  Intact skin with non-blanchable redness of a localized area usually over a bony prominence.  Wound Description (Comments):   Present on Admission: Yes   Wound care consulted and recommendations given.    Mild anemia:  Stable hemoglobin around 12. Recheck labs in am.     Elevated lactic acid:  Resolved with IV fluids.    Chronic respiratory failure with hypoxia 2 L of nasal cannula oxygen. No wheezing heard on exam, resume duonebs as needed.  continue to monitor.    Solitary pulmonary nodule 10 mm spiculated pulmonary nodule seen in the right upper lobe suspicious for primary bronchogenic carcinoma.   Mildly elevated liver enzymes Patient denies any nausea vomiting or abdominal pain. ? Liver mets.    Discussed the results of the MRI of the lumbar spine with the patient's daughter she wanted to talk to the rest of the family members and the patient and decide whether to pursue aggressive route vs pain control. She would like  to talk to palliative care for goals of care. palliative care meeting on 02/14/2020, patient would like to be evaluated by IR for biopsy. He is not interested in more invasive procedures such as bronchoscopy if lesions are not amenable to less invasive options such as needle biopsy. He would also be open to evaluation by IR for potential kyphoplasty. IR consulted and he underwent CT guided Bx of paraspinal mass adjacent to the T5 vertebral body on Monday, 8/2, .     DVT prophylaxis: Lovenox.  Code Status: DNR. Family Communication: discussed with daughter over the  phone Disposition:   Status is: Inpatient  Remains inpatient appropriate because:Ongoing diagnostic testing needed not appropriate for outpatient work up   Dispo: The patient is from: Home              Anticipated d/c is to: pending.  waiting for PTe valuation.               Anticipated d/c date is: > 3 days              Patient currently is not medically stable to d/c.       Consultants:   Palliative care.   Procedures:  MRI Lumbar spine.  Antimicrobials: none.   Subjective: Pain well controlled. No chest pain or sob.    Objective: Vitals:   02/16/20 1235 02/16/20 1303 02/16/20 1354 02/16/20 1456  BP: (!) 147/98 (!) 152/98 (!) 140/97 (!) 155/88  Pulse: (!) 115 (!) 110 94 86  Resp: (!) 24 19 18 18   Temp:  98.7 F (37.1 C) 97.6 F (36.4 C) 97.7 F (36.5 C)  TempSrc:  Oral Oral Oral  SpO2: 100% 98% 97% 100%  Weight:  Height:        Intake/Output Summary (Last 24 hours) at 02/16/2020 1640 Last data filed at 02/16/2020 1129 Gross per 24 hour  Intake 30 ml  Output 1200 ml  Net -1170 ml   Filed Weights   02/11/20 1151  Weight: 78.5 kg    Examination:  General exam: Comfortably sleeping not in any kind of distress Respiratory system: Diminished air entry at bases no tachypnea on 2 L of nasal cannula oxygen. Cardiovascular system: S1-S2 heard, regular rate rhythm, no JVD, Gastrointestinal system: Abdomen is soft, nontender, nondistended, bowel sounds normal Central nervous system: Alert and oriented, able to move all extremities Extremities: No cyanosis or clubbing Skin: Stage I sacral decubitus ulcer Psychiatry: mood Is appropriate   Data Reviewed: I have personally reviewed following labs and imaging studies  CBC: Recent Labs  Lab 02/11/20 1249 02/12/20 0528 02/13/20 1032 02/16/20 0534  WBC 11.1* 7.8 9.8 11.7*  NEUTROABS 9.1*  --   --   --   HGB 14.0 11.8* 12.2* 11.8*  HCT 44.5 38.2* 38.9* 37.7*  MCV 94.5 96.7 95.6 94.3  PLT 381 286 298 351     Basic Metabolic Panel: Recent Labs  Lab 02/11/20 1249 02/12/20 0528 02/13/20 0530 02/13/20 1032 02/16/20 0534  NA 141 143  --  140 143  K 4.6 5.0  --  4.5 4.2  CL 96* 105  --  100 106  CO2 25 30  --  24 26  GLUCOSE 150* 107*  --  105* 117*  BUN 99* 71*  --  32* 41*  CREATININE 2.52* 1.50*  --  1.07 1.00  CALCIUM 11.8* 10.8* 10.5* 10.3 9.5    GFR: Estimated Creatinine Clearance: 71.1 mL/min (by C-G formula based on SCr of 1 mg/dL).  Liver Function Tests: Recent Labs  Lab 02/11/20 1249 02/12/20 0528 02/16/20 0534  AST 41 37 73*  ALT 25 23 48*  ALKPHOS 115 101 89  BILITOT 0.8 0.5 0.6  PROT 8.0 6.8 6.2*  ALBUMIN 3.7 3.3* 3.1*    CBG: No results for input(s): GLUCAP in the last 168 hours.   Recent Results (from the past 240 hour(s))  SARS Coronavirus 2 by RT PCR (hospital order, performed in [facility] hospital lab) Nasopharyngeal Nasopharyngeal Swab     Status: None   Collection Time: 02/11/20  1:24 PM   Specimen: Nasopharyngeal Swab  Result Value Ref Range Status   SARS Coronavirus 2 NEGATIVE NEGATIVE Final    Comment: (NOTE) SARS-CoV-2 target nucleic acids are NOT DETECTED.  The SARS-CoV-2 RNA is generally detectable in upper and lower respiratory specimens during the acute phase of infection. The lowest concentration of SARS-CoV-2 viral copies this assay can detect is 250 copies / mL. A negative result does not preclude SARS-CoV-2 infection and should not be used as the sole basis for treatment or other patient management decisions.  A negative result may occur with improper specimen collection / handling, submission of specimen other than nasopharyngeal swab, presence of viral mutation(s) within the areas targeted by this assay, and inadequate number of viral copies (<250 copies / mL). A negative result must be combined with clinical observations, patient history, and epidemiological information.  Fact Sheet for Patients:    StrictlyIdeas.no  Fact Sheet for Healthcare Providers: BankingDealers.co.za  This test is not yet approved or  cleared by the [place] FDA and has been authorized for detection and/or diagnosis of SARS-CoV-2 by FDA under an Emergency Use Authorization (EUA).  This EUA will  remain in effect (meaning this test can be used) for the duration of the COVID-19 declaration under Section 564(b)(1) of the Act, 21 U.S.C. section 360bbb-3(b)(1), unless the authorization is terminated or revoked sooner.  Performed at [facility], [place] [address], [place], [place] [address]   Urine culture     Status: Abnormal   Collection Time: 02/11/20  8:30 PM   Specimen: Urine, Clean Catch  Result Value Ref Range Status   Specimen Description   Final    URINE, CLEAN CATCH Performed at [facility], [place] [address], [place], [place] [address]    Special Requests   Final    NONE Performed at [facility], [place] [address], [place], [place] [address]    Culture (A)  Final    <10,000 COLONIES/mL INSIGNIFICANT GROWTH Performed at [place] [address], [place], [place] [address]    Report Status 02/12/2020 FINAL  Final         Radiology Studies: CT BIOPSY  Result Date: 03/15/20 INDICATION: 74 year old male with multifocal lytic osseous lesions concerning for metastatic disease. Primary malignancy is uncertain at this time. EXAM: CT-guided bone biopsy MEDICATIONS: None. ANESTHESIA/SEDATION: Moderate (conscious) sedation was employed during this procedure. A total of Versed 2 mg and Fentanyl 100 mcg was administered intravenously. Moderate Sedation Time: 13 minutes. The patient's level of consciousness and vital signs were monitored continuously by radiology nursing throughout the procedure under my direct supervision. FLUOROSCOPY TIME:  None COMPLICATIONS: None immediate. PROCEDURE:  Informed written consent was obtained from the patient after a thorough discussion of the procedural risks, benefits and alternatives. All questions were addressed. Maximal Sterile Barrier Technique was utilized including caps, mask, sterile gowns, sterile gloves, sterile drape, hand hygiene and skin antiseptic. A timeout was performed prior to the initiation of the procedure. The patient was placed in the prone position. A planning axial CT imaging was obtained. The soft tissue mass emanating from the posterior aspect of the right fifth rib and adjacent T5 vertebral body was successfully identified. A suitable skin entry site was selected and marked. Sterile prep and drape was then performed using chlorhexidine skin prep. Local anesthesia was attained by infiltration with 1% lidocaine. A small dermatotomy was made. Under intermittent CT guidance, a 17 gauge introducer needle was advanced into the margin of the mass. Multiple 18 gauge core biopsies were then obtained using the bio Pince automated biopsy device. Biopsy specimens were placed in formalin and delivered to pathology for further analysis. The biopsy device was removed. Hemostasis was attained by gentle manual pressure. The patient tolerated the procedure well. IMPRESSION: Technically successful CT-guided core biopsy of T5 paraspinal mass. Electronically Signed   By: Jacqulynn Cadet M.D.   On: 03-15-20 13:53        Scheduled Meds: . atorvastatin  80 mg Oral q1800  . dexamethasone (DECADRON) injection  10 mg Intravenous Q12H  . enoxaparin (LOVENOX) injection  40 mg Subcutaneous Q24H  . feeding supplement (ENSURE ENLIVE)  237 mL Oral TID BM  . fentaNYL      . flumazenil      . fluticasone furoate-vilanterol  1 puff Inhalation Daily   And  . umeclidinium bromide  1 puff Inhalation Daily  . midazolam      . naloxone       Continuous Infusions: . sodium chloride 75 mL/hr at 02/15/20 1745     LOS: 5 days        Hosie Poisson,  MD [facility]   To contact the attending provider between 7A-7P or the covering provider during after hours 7P-7A, please log into the web site [email or web address] and access using universal [place] password for that web site. If you do not have the password, please call the hospital operator.  02/16/2020, 4:40 PM

## 2020-02-16 NOTE — Procedures (Signed)
Interventional Radiology Procedure Note  Procedure: CT guided bx of T5 paraspinal mass  Complications: None  Estimated Blood Loss: None  Recommendations: - Path sent - Bedrest x 1 hr   Signed,  Criselda Peaches, MD

## 2020-02-16 NOTE — Care Management Important Message (Signed)
Important Message  Patient Details IM Letter given to the Patient Name: George Frazier MRN: [id number] Date of Birth: 22-Jun-1946   Medicare Important Message Given:  Yes     Kerin Salen 02/16/2020, 10:33 AM

## 2020-02-17 DIAGNOSIS — M546 Pain in thoracic spine: Secondary | ICD-10-CM

## 2020-02-17 DIAGNOSIS — Z515 Encounter for palliative care: Secondary | ICD-10-CM

## 2020-02-17 DIAGNOSIS — R911 Solitary pulmonary nodule: Secondary | ICD-10-CM

## 2020-02-17 DIAGNOSIS — R531 Weakness: Secondary | ICD-10-CM

## 2020-02-17 DIAGNOSIS — Z7189 Other specified counseling: Secondary | ICD-10-CM

## 2020-02-17 LAB — MULTIPLE MYELOMA PANEL, SERUM

## 2020-02-17 MED ORDER — AMLODIPINE BESYLATE 10 MG PO TABS
10.0000 mg | ORAL_TABLET | Freq: Every day | ORAL | Status: DC
  Administered 2020-02-17 – 2020-02-23 (×6): 10 mg via ORAL
  Filled 2020-02-17 (×6): qty 1

## 2020-02-17 NOTE — Evaluation (Signed)
Physical Therapy Evaluation Patient Details Name: George Frazier MRN: [id number] DOB: Jan 24, 1946 Today's Date: 02/17/2020   History of Present Illness  74 yo male admitted with hypotension, back pain likely due to met cancer, weakness. Hx of COPD-O2 dep, CHF, AAA, medical noncompliance  Clinical Impression  On eval, pt required Mod assist +2 for mobility. He was able to walk ~5 feet across the room with a RW. Pt presents with general weakness, decreased activity tolerance, and impaired gait and balance. Mobility was limited by weakness, pain, and dyspnea. 93% on 3L [place]. Discussed d/c plan-pt would like to be able to go home. At this time, recommendation is for SNF rehab, if pt is agreeable. Will continue to follow during hospital stay and progress activity as able.     Follow Up Recommendations SNF (if pt is agreeable)    Equipment Recommendations   (continuing to assess; may be determined at next venue if pt goes to SNF)    Recommendations for Other Services       Precautions / Restrictions Precautions Precautions: Fall Restrictions Weight Bearing Restrictions: No      Mobility  Bed Mobility               General bed mobility comments: oob in recliner  Transfers Overall transfer level: Needs assistance Equipment used: Rolling walker (2 wheeled) Transfers: Sit to/from Stand Sit to Stand: Mod assist;+2 physical assistance;+2 safety/equipment         General transfer comment: Assist to power up, stabilize, control descent. VCs safety, technique, hand placement. Increased time. Shaky and dyspneic.  Ambulation/Gait Ambulation/Gait assistance: Min assist;+2 safety/equipment;+2 physical assistance Gait Distance (Feet): 5 Feet Assistive device: Rolling walker (2 wheeled) Gait Pattern/deviations: Decreased stride length;Decreased step length - right;Decreased step length - left     General Gait Details: Assist to stabilize throughout distance. Unsteady and dyspneic.  Fatigues quickly. Followed closely with recliner. 93% on 3L [place].  Stairs            Wheelchair Mobility    Modified Rankin (Stroke Patients Only)       Balance Overall balance assessment: Needs assistance         Standing balance support: Bilateral upper extremity supported Standing balance-Leahy Scale: Poor                               Pertinent Vitals/Pain Pain Assessment: 0-10 Pain Score: 7  Pain Location: back, L flank Pain Descriptors / Indicators: Discomfort;Sore;Aching Pain Intervention(s): Limited activity within patient's tolerance;Monitored during session;Repositioned    Home Living Family/patient expects to be discharged to:: Private residence Living Arrangements: Other relatives (nephew) Available Help at Discharge: Family Type of Home: House Home Access: Stairs to enter   Technical brewer of Steps: 1 Home Layout: One level Home Equipment: Environmental consultant - 2 wheels;Cane - single point      Prior Function Level of Independence: Needs assistance   Gait / Transfers Assistance Needed: independent  ADL's / Homemaking Assistance Needed: family assisting with cooking, cleaning        Hand Dominance        Extremity/Trunk Assessment   Upper Extremity Assessment Upper Extremity Assessment: Generalized weakness    Lower Extremity Assessment Lower Extremity Assessment: Generalized weakness (~3+/5 throughout)    Cervical / Trunk Assessment Cervical / Trunk Assessment: Normal  Communication   Communication: No difficulties  Cognition Arousal/Alertness: Awake/alert Behavior During Therapy: WFL for tasks assessed/performed Overall Cognitive Status: Within  Functional Limits for tasks assessed                                        General Comments      Exercises     Assessment/Plan    PT Assessment Patient needs continued PT services  PT Problem List Decreased strength;Decreased mobility;Decreased activity  tolerance;Decreased balance;Pain;Decreased knowledge of use of DME       PT Treatment Interventions DME instruction;Gait training;Therapeutic activities;Therapeutic exercise;Patient/family education;Balance training;Functional mobility training    PT Goals (Current goals can be found in the Care Plan section)  Acute Rehab PT Goals Patient Stated Goal: less pain. to be able to go home PT Goal Formulation: With patient Time For Goal Achievement: 03/02/20 Potential to Achieve Goals: Fair    Frequency Min 3X/week   Barriers to discharge        Co-evaluation               AM-PAC PT "6 Clicks" Mobility  Outcome Measure Help needed turning from your back to your side while in a flat bed without using bedrails?: A Lot Help needed moving from lying on your back to sitting on the side of a flat bed without using bedrails?: A Lot Help needed moving to and from a bed to a chair (including a wheelchair)?: A Lot Help needed standing up from a chair using your arms (e.g., wheelchair or bedside chair)?: A Lot Help needed to walk in hospital room?: A Lot Help needed climbing 3-5 steps with a railing? : Total 6 Click Score: 11    End of Session Equipment Utilized During Treatment: Gait belt;Oxygen Activity Tolerance: Patient limited by fatigue Patient left: in chair;with call bell/phone within reach;with chair alarm set   PT Visit Diagnosis: Muscle weakness (generalized) (M62.81);Difficulty in walking, not elsewhere classified (R26.2)    Time: 5974-7185 PT Time Calculation (min) (ACUTE ONLY): 17 min   Charges:   PT Evaluation $PT Eval Low Complexity: [place], PT Acute Rehabilitation  Office: [phone] Pager: [phone]

## 2020-02-17 NOTE — Progress Notes (Signed)
PROGRESS NOTE    George Frazier  TDD:[id number] DOB: February 14, 1946 DOA: 02/11/2020 PCP: Patient, No Pcp Per    Chief Complaint  Patient presents with  . Weakness  . Shortness of Breath    Brief Narrative:  74 year old male with prior history of COPD on home oxygen, lung nodule, hypertension, chronic diastolic heart failure presents to ED for generalized weakness and decreased appetite. On arrival to the ED patient was found to be in AKI, hypercalcemic,  elevated lactic acid, D-dimer greater than 20.  Urine analysis was negative for infection.  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 Complete spinal imaging recommended. He was referred to [facility] for admission.  Bilateral lower extremity duplex is negative for DVT.  CT of the chest, abdomen and pelvis shows 10 mm spiculated pulmonary nodule in the right upper lobe and a 5 mm nodule in the right upper lobe of the lungs. Reviewed the results with the oncologist on call Dr. Marin Olp who recommended IV Decadron 20 mg once followed by 10 mg twice daily until family decides regarding aggresive treatment versus hospice.  Meanwhile myeloma panel ordered for further evaluation. Palliative meeting on 7/31, patient would like to be evaluated by IR for biopsy. He is not interested in more invasive procedures such as bronchoscopy if lesions are not amenable to less invasive options such as needle biopsy. He would also be open to evaluation by IR for potential kyphoplasty. 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 Currently waiting for the results of paraspinal mass biopsy , PT evaluation and the timing of kyphoplasty by IR.   Assessment & Plan:   Principal  Problem:   Hypotension Active Problems:   COPD, group D, by GOLD 2017 classification ([place])   Chronic respiratory failure with hypoxia (HCC)   Hypertension   Hyperlipidemia   Solitary pulmonary nodule- R middle lobe- followed by pulm   Back pain   AKI (acute kidney injury) ([place])   Pressure injury of skin   Generalized weakness, back pain and decreased appetite Probably secondary to metastatic disease vs myeloma.  Myeloma panel ordered and is pending..  CT of the chest without contrast shows 2 pulmonary nodules , 10 mm and 5 mm nodule in size in the right upper lobe of the lung suspicious for primary bronchogenic carcinoma. Pt has a 40 year history of smoking. Further evaluation of the spine ordered and the results reviewed with the patient and her daughter. Discussed with on-call oncologist who recommended 20 mg of IV Decadron followed by 10 mg twice daily of IV Decadron while we wait for the family decision regarding aggressive management versus comfort approach. PSA ordered and wnl. Palliative care meeting on 02/14/2020, patient would like to be evaluated by IR for biopsy. He is not interested in more invasive procedures such as bronchoscopy if lesions are not amenable to less invasive options such as needle biopsy. He would also be open to evaluation by IR for potential kyphoplasty. 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  Meanwhile continue with IV Decadron and await myeloma panel results and PT evaluation.  No new events overnight.    Hypercalcemia;  Probably from malignancy vs dehydration.  PTH is low at 13. Repeat calcium is 9.5.  Essential hypertension Suboptimally controlled probably secondary to back pain. As needed hydralazine will be added. Added norvasc 10 mg daily.   Hypotension on admission resolved with IV fluids.   AKI:  Probably sec to  dehydration , poor oral intake and poor renal perfusion from hypotension  Improving with IV fluids.   creatinine at baseline.   Hyperlipidemia: resume statin.     Pressure Injury present on admission.  Pressure Injury 02/11/20 Sacrum Left;Right Stage 1 -  Intact skin with non-blanchable redness of a localized area usually over a bony prominence. (Active)  02/11/20 1816  Location: Sacrum  Location Orientation: Left;Right  Staging: Stage 1 -  Intact skin with non-blanchable redness of a localized area usually over a bony prominence.  Wound Description (Comments):   Present on Admission: Yes   Wound care consulted and recommendations given.    Mild anemia:  Stable hemoglobin around 12.     Elevated lactic acid:  Resolved with IV fluids.    Chronic respiratory failure with hypoxia secondary to COPD 2 L of nasal cannula oxygen No wheezing. Resume Breo Ellipta and no wheezing on exam today.     Solitary pulmonary nodule 10 mm spiculated pulmonary nodule seen in the right upper lobe suspicious for primary bronchogenic carcinoma. Await results of the paraspinal biopsy.    Mildly elevated liver enzymes Patient denies any nausea vomiting or abdominal pain. ? Liver mets.    Constipation:  On senna and colace, will add miralax and dulcolax as needed.    Discussed the results of the MRI of the lumbar spine with the patient's daughter she wanted to talk to the rest of the family members and the patient and decide whether to pursue aggressive route vs pain control. She would like  to talk to palliative care for goals of care. palliative care meeting on 02/14/2020, patient would like to be evaluated by IR for biopsy. He is not interested in more invasive procedures such as bronchoscopy if lesions are not amenable to less invasive options such as needle biopsy. He would also be open to evaluation by IR for potential kyphoplasty. IR consulted and he underwent CT guided Bx of paraspinal  mass adjacent to the T5 vertebral body on Monday, 8/2, .     DVT prophylaxis: Lovenox.  Code Status: DNR. Family Communication: discussed with daughter over the phone on 8/2. Left message on 8/3 Disposition:   Status is: Inpatient  Remains inpatient appropriate because:Ongoing diagnostic testing needed not appropriate for outpatient work up   Dispo: The patient is from: Home              Anticipated d/c is to: pending.  waiting for PT evaluation.               Anticipated d/c date is: > 3 days              Patient currently is not medically stable to d/c.       Consultants:   Palliative care.   Procedures:  MRI Lumbar spine.  Antimicrobials: none.   Subjective: No new complaints today, talked about working with PT today.   Objective: Vitals:   02/16/20 1456 02/16/20 2048 02/17/20 0448 02/17/20 0650  BP: (!) 155/88 (!) 153/94 (!) 180/110 (!) 160/95  Pulse: 86 91 92   Resp: 18 (!) 22 16  Temp: 97.7 F (36.5 C) 97.9 F (36.6 C) 98.3 F (36.8 C)   TempSrc: Oral Oral Oral   SpO2: 100% 96% 96%   Weight:      Height:        Intake/Output Summary (Last 24 hours) at 02/17/2020 1220 Last data filed at 02/17/2020 0600 Gross per 24 hour  Intake 1586.25 ml  Output 50 ml  Net 1536.25 ml   Filed Weights   02/11/20 1151  Weight: 78.5 kg    Examination:  General exam: Alert and comfortable on 2 L of nasal cannula oxygen. Respiratory system: Air entry fair bilateral, no wheezing or rhonchi, no tachypnea on 2 L of nasal cannula oxygen. Cardiovascular system: S1-S2 heard, regular rate rhythm, no JVD, no pedal edema Gastrointestinal system: Abdomen is soft, nontender, nondistended, bowel sounds are normal Central nervous system: Alert and oriented to person and place, grossly nonfocal Extremities: No pedal edema, cyanosis or clubbing Skin: Stage I sacral decubitus ulcer. Psychiatry: Mood is appropriate   Data Reviewed: I have personally reviewed following labs and  imaging studies  CBC: Recent Labs  Lab 02/11/20 1249 02/12/20 0528 02/13/20 1032 02/16/20 0534 02/16/20 1928  WBC 11.1* 7.8 9.8 11.7* 11.5*  NEUTROABS 9.1*  --   --   --   --   HGB 14.0 11.8* 12.2* 11.8* 12.2*  HCT 44.5 38.2* 38.9* 37.7* 39.5  MCV 94.5 96.7 95.6 94.3 96.1  PLT 381 286 298 351 299    Basic Metabolic Panel: Recent Labs  Lab 02/11/20 1249 02/12/20 0528 02/13/20 0530 02/13/20 1032 02/16/20 0534  NA 141 143  --  140 143  K 4.6 5.0  --  4.5 4.2  CL 96* 105  --  100 106  CO2 25 30  --  24 26  GLUCOSE 150* 107*  --  105* 117*  BUN 99* 71*  --  32* 41*  CREATININE 2.52* 1.50*  --  1.07 1.00  CALCIUM 11.8* 10.8* 10.5* 10.3 9.5    GFR: Estimated Creatinine Clearance: 71.1 mL/min (by C-G formula based on SCr of 1 mg/dL).  Liver Function Tests: Recent Labs  Lab 02/11/20 1249 02/12/20 0528 02/16/20 0534  AST 41 37 73*  ALT 25 23 48*  ALKPHOS 115 101 89  BILITOT 0.8 0.5 0.6  PROT 8.0 6.8 6.2*  ALBUMIN 3.7 3.3* 3.1*    CBG: No results for input(s): GLUCAP in the last 168 hours.   Recent Results (from the past 240 hour(s))  SARS Coronavirus 2 by RT PCR (hospital order, performed in [facility] hospital lab) Nasopharyngeal Nasopharyngeal Swab     Status: None   Collection Time: 02/11/20  1:24 PM   Specimen: Nasopharyngeal Swab  Result Value Ref Range Status   SARS Coronavirus 2 NEGATIVE NEGATIVE Final    Comment: (NOTE) SARS-CoV-2 target nucleic acids are NOT DETECTED.  The SARS-CoV-2 RNA is generally detectable in upper and lower respiratory specimens during the acute phase of infection. The lowest concentration of SARS-CoV-2 viral copies this assay can detect is 250 copies / mL. A negative result does not preclude SARS-CoV-2 infection and should not be used as the sole basis for treatment or other patient management decisions.  A negative result may occur with improper specimen collection / handling, submission of specimen other than  nasopharyngeal swab, presence of viral mutation(s) within the areas targeted by this assay, and inadequate number of viral copies (<250 copies / mL). A negative result must be combined with clinical observations, patient history,  and epidemiological information.  Fact Sheet for Patients:   StrictlyIdeas.no  Fact Sheet for Healthcare Providers: BankingDealers.co.za  This test is not yet approved or  cleared by the [place] FDA and has been authorized for detection and/or diagnosis of SARS-CoV-2 by FDA under an Emergency Use Authorization (EUA).  This EUA will remain in effect (meaning this test can be used) for the duration of the COVID-19 declaration under Section 564(b)(1) of the Act, 21 U.S.C. section 360bbb-3(b)(1), unless the authorization is terminated or revoked sooner.  Performed at [facility], [place] [address], [place], [place] [address]   Urine culture     Status: Abnormal   Collection Time: 02/11/20  8:30 PM   Specimen: Urine, Clean Catch  Result Value Ref Range Status   Specimen Description   Final    URINE, CLEAN CATCH Performed at [facility], [place] [address] [place], [place] [address]    Special Requests   Final    NONE Performed at [facility], [place] [address], [place], [place] [address]    Culture (A)  Final    <10,000 COLONIES/mL INSIGNIFICANT GROWTH Performed at [place] [address] [place], [place] [address]    Report Status 02/12/2020 FINAL  Final         Radiology Studies: CT BIOPSY  Result Date: 17-Feb-2020 INDICATION: 74 year old male with multifocal lytic osseous lesions concerning for metastatic disease. Primary malignancy is uncertain at this time. EXAM: CT-guided bone biopsy MEDICATIONS: None. ANESTHESIA/SEDATION: Moderate (conscious) sedation was employed during this procedure. A total of Versed 2 mg and Fentanyl 100 mcg  was administered intravenously. Moderate Sedation Time: 13 minutes. The patient's level of consciousness and vital signs were monitored continuously by radiology nursing throughout the procedure under my direct supervision. FLUOROSCOPY TIME:  None COMPLICATIONS: None immediate. PROCEDURE: Informed written consent was obtained from the patient after a thorough discussion of the procedural risks, benefits and alternatives. All questions were addressed. Maximal Sterile Barrier Technique was utilized including caps, mask, sterile gowns, sterile gloves, sterile drape, hand hygiene and skin antiseptic. A timeout was performed prior to the initiation of the procedure. The patient was placed in the prone position. A planning axial CT imaging was obtained. The soft tissue mass emanating from the posterior aspect of the right fifth rib and adjacent T5 vertebral body was successfully identified. A suitable skin entry site was selected and marked. Sterile prep and drape was then performed using chlorhexidine skin prep. Local anesthesia was attained by infiltration with 1% lidocaine. A small dermatotomy was made. Under intermittent CT guidance, a 17 gauge introducer needle was advanced into the margin of the mass. Multiple 18 gauge core biopsies were then obtained using the bio Pince automated biopsy device. Biopsy specimens were placed in formalin and delivered to pathology for further analysis. The biopsy device was removed. Hemostasis was attained by gentle manual pressure. The patient tolerated the procedure well. IMPRESSION: Technically successful CT-guided core biopsy of T5 paraspinal mass. Electronically Signed   By: Jacqulynn Cadet M.D.   On: 17-Feb-2020 13:53        Scheduled Meds: . atorvastatin  80 mg Oral q1800  . dexamethasone (DECADRON) injection  10 mg Intravenous Q12H  . enoxaparin (LOVENOX) injection  40 mg Subcutaneous Q24H  . feeding supplement (ENSURE ENLIVE)  237 mL Oral TID BM  . fluticasone  furoate-vilanterol  1 puff Inhalation Daily   And  . umeclidinium bromide  1 puff Inhalation Daily  Continuous Infusions: . sodium chloride 75 mL/hr at 02/17/20 0752     LOS: 6 days        Hosie Poisson, MD [facility]   To contact the attending provider between 7A-7P or the covering provider during after hours 7P-7A, please log into the web site [email or web address] and access using universal [place] password for that web site. If you do not have the password, please call the hospital operator.  02/17/2020, 12:20 PM

## 2020-02-17 NOTE — Progress Notes (Signed)
Daily Progress Note   Patient Name: George Frazier       Date: 02/17/2020 DOB: 28-Dec-1945  Age: 74 y.o. MRN#: [id number] Attending Physician: Hosie Poisson, MD Primary Care Physician: Patient, No Pcp Per Admit Date: 02/11/2020  Reason for Consultation/Follow-up: Establishing goals of care  Subjective:  patient is awake alert resting in bed Pain is reasonably well controlled Had biopsy of T 5 para spinal mass yesterday, results pending.  Hasn't had a bowel movement in 3 days now, we talked about bowel regimen. Appetite is fair See below.   Length of Stay: 6  Current Medications: Scheduled Meds:  . atorvastatin  80 mg Oral q1800  . dexamethasone (DECADRON) injection  10 mg Intravenous Q12H  . enoxaparin (LOVENOX) injection  40 mg Subcutaneous Q24H  . feeding supplement (ENSURE ENLIVE)  237 mL Oral TID BM  . fluticasone furoate-vilanterol  1 puff Inhalation Daily   And  . umeclidinium bromide  1 puff Inhalation Daily    Continuous Infusions: . sodium chloride 75 mL/hr at 02/17/20 0752    PRN Meds: acetaminophen **OR** acetaminophen, albuterol, hydrALAZINE, HYDROmorphone (DILAUDID) injection, ondansetron **OR** ondansetron (ZOFRAN) IV, senna-docusate  Physical Exam         Awake alert No distress mild back pain Regular work of breathing S1 S2 Abdomen not distended No edema Non focal   Vital Signs: BP (!) 160/95   Pulse 92   Temp 98.3 F (36.8 C) (Oral)   Resp 16   Ht 6' (1.829 m)   Wt 78.5 kg   SpO2 96%   BMI 23.46 kg/m  SpO2: SpO2: 96 % O2 Device: O2 Device: Nasal Cannula O2 Flow Rate: O2 Flow Rate (L/min): 3 L/min  Intake/output summary:   Intake/Output Summary (Last 24 hours) at 02/17/2020 1010 Last data filed at 02/17/2020 0600 Gross per 24 hour  Intake  1586.25 ml  Output 350 ml  Net 1236.25 ml   LBM: Last BM Date: 02/14/20 Baseline Weight: Weight: 78.5 kg Most recent weight: Weight: 78.5 kg       Palliative Assessment/Data:    Flowsheet Rows     Most Recent Value  Intake Tab  Referral Department Hospitalist  Unit at Time of Referral Med/Surg Unit  Palliative Care Primary Diagnosis Cancer  Date Notified 02/12/20  Date of Admission 02/11/20  Date  first seen by Palliative Care 02/14/20  # of days Palliative referral response time 2 Day(s)  # of days IP prior to Palliative referral 1  Clinical Assessment  Palliative Performance Scale Score 40%  Psychosocial & Spiritual Assessment  Palliative Care Outcomes  Patient/Family meeting held? Yes  Who was at the meeting? patient, daughter, nephew      Patient Active Problem List   Diagnosis Date Noted  . Pressure injury of skin 02/12/2020  . Hypotension 02/11/2020  . Back pain 02/11/2020  . AKI (acute kidney injury) ([place]) 02/11/2020  . 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 H/O noncompliance with medical treatment, presenting hazards to health 09/04/2019  . History of prediabetes-  A1c 6.1 in December 2018 12/23/2018  . Solitary pulmonary nodule- R middle lobe- followed by pulm 08/20/2018  . Accelerated hypertension 08/08/2018  . Hyperlipidemia 12/13/2017  . Vitamin D insufficiency 12/13/2017  . Physical deconditioning 11/15/2017  . Hypertension 11/06/2017  . Stopped smoking 2008 with greater than 40 pack year history 11/06/2017  . Chronic fatigue 11/06/2017  . Dyspnea on minimal exertion-likely due to COPD 11/06/2017  . Chronic respiratory failure with hypoxia ([place]) 09/17/2017  . CAP (community acquired pneumonia) 07/14/2017  . COPD, group D, by GOLD 2017 classification ([place]) 07/14/2017  . Heart failure ([place]) 07/14/2017    Palliative Care Assessment & Plan   Patient Profile:  74 year old male with prior history of COPD on home oxygen,  lung nodule, hypertension, chronic diastolic heart failure presents to ED for generalized weakness and decreased appetite  Assessment:  generalized weakness, back pain, decreased appetite Constipation Hyper calcemia HTN Solitary pulmonary nodule T 5 para spinal mass biopsy, results pending.   Recommendations/Plan: Med history noted, discussed with patient. He is on Decadron, Tylenol and Dilaudid PRN. Continue to monitor.  May need PT eval, likely SNF rehab with palliative on discharge.  PPS 40%  Code Status:    Code Status Orders  (From admission, onward)         Start     Ordered   02/11/20 1710  Do not attempt resuscitation (DNR)  Continuous       Question Answer Comment  In the event of cardiac or respiratory ARREST Do not call a "code blue"   In the event of cardiac or respiratory ARREST Do not perform Intubation, CPR, defibrillation or ACLS   In the event of cardiac or respiratory ARREST Use medication by any route, position, wound care, and other measures to relive pain and suffering. May use oxygen, suction and manual treatment of airway obstruction as needed for comfort.      02/11/20 1713        Code Status History    Date Active Date Inactive Code Status Order ID Comments User Context   08/08/2018 2207 08/10/2018 1708 Full Code [id number]  Bonnielee Haff, MD Inpatient   09/17/2017 1847 09/21/2017 1626 Full Code [id number]  Evans Lance, MD ED   07/14/2017 2049 07/18/2017 1847 DNR [id number]  Karmen Bongo, MD Inpatient   Advance Care Planning Activity       Prognosis:   Unable to determine  Discharge Planning:  To Be Determined Discussed with patient about home with home health/PT palliative versus SNF with palliative, based on hospital course, biopsy results and overall disease trajectory.   Care plan was discussed with  Patient.   Thank you for allowing the Palliative Medicine Team to assist in the care of this patient.   Time In: 26  Time Out: 11.25  Total Time 25 Prolonged Time Billed No       Greater than 50%  of this time was spent counseling and coordinating care related to the above assessment and plan.  Loistine Chance, MD  Please contact Palliative Medicine Team phone at [phone] for questions and concerns.

## 2020-02-18 DIAGNOSIS — N179 Acute kidney failure, unspecified: Secondary | ICD-10-CM

## 2020-02-18 DIAGNOSIS — J9611 Chronic respiratory failure with hypoxia: Secondary | ICD-10-CM

## 2020-02-18 DIAGNOSIS — C7951 Secondary malignant neoplasm of bone: Secondary | ICD-10-CM

## 2020-02-18 DIAGNOSIS — E860 Dehydration: Secondary | ICD-10-CM

## 2020-02-18 DIAGNOSIS — J449 Chronic obstructive pulmonary disease, unspecified: Secondary | ICD-10-CM

## 2020-02-18 LAB — CREATININE, SERUM
Creatinine, Ser: 0.94 mg/dL (ref 0.61–1.24)
GFR calc Af Amer: >60 mL/min (ref >60)
GFR calc non Af Amer: >60 mL/min (ref >60)

## 2020-02-18 MED ORDER — OXYCODONE HCL 5 MG PO TABS
5.0000 mg | ORAL_TABLET | ORAL | Status: DC | PRN
  Administered 2020-02-18 – 2020-02-21 (×5): 5 mg via ORAL
  Filled 2020-02-18 (×5): qty 1

## 2020-02-18 NOTE — TOC Initial Note (Signed)
Transition of Care [facility]) - Initial/Assessment Note    Patient Details  Name: George Frazier MRN: [id number] Date of Birth: 1946-04-24  Transition of Care [facility]) CM/SW Contact:    Lennart Pall, LCSW Phone Number: 02/18/2020, 4:19 PM  Clinical Narrative:                 Met with pt and have spoken via phone with pt's daughter, Mickel Baas and his sister.  All very pleasant and are aware of tx recommendations for SNF.  Pt is a little apprehensive about dc plan and would like to wait on decision until biopsy results are back.  He appears to have a good, general understanding of his overall situation and realistic about his current functional limitations.  He is agreed to my contacting daughter to discuss plans.   Pt's daughter and sister are very concerned about pt's care needs and admit they are unsure how to prepare and whether to bring pt home vs SNF.  Sister states that there are several family members who could pull together to provide 24/7 but she understands there could be benefit to short term SNF for therapy to improve overall strength.  We talked about making decision/ plans for the immediate time and for longer term needs. We discussed likelihood that he may be wheelchair bound at some point and need to make sure home is accessible - they confirm that it is as pt's sister was w/c bound and they had made the house accessible for her.  We discussed palliative care and hospice care and what that would look like in the home.  Have explained what services would be covered under Medicare in the home vs SNF.   Both appreciative of information and plan to discuss among the family and daughter to talk to patient this evening.  They are asking if biopsy results might be in by tomorrow - will check with MD.  Plan to follow up with pt and daughter tomorrow morning to see if they have reached a decision on home with 24/7 vs SNF.    Expected Discharge Plan: Skilled Nursing Facility Barriers to Discharge: Continued  Medical Work up   Patient Goals and CMS Choice Patient states their goals for this hospitalization and ongoing recovery are:: Pt anxious to receive biopsy report.  He would prefer to d/c home, however, aware may need SNF      Expected Discharge Plan and Services Expected Discharge Plan: [place] In-house Referral: Clinical Social Work     Living arrangements for the past 2 months: Single Family Home                                      Prior Living Arrangements/Services Living arrangements for the past 2 months: Single Family Home Lives with:: Relatives (lives with nephew) Patient language and need for interpreter reviewed:: Yes Do you feel safe going back to the place where you live?: No (Pt uncertain about return home as he has limited support)      Need for Family Participation in Patient Care: Yes (Comment) Care giver support system in place?: Yes (comment) Current home services: DME (has O2 at home) Criminal Activity/Legal Involvement Pertinent to Current Situation/Hospitalization: No - Comment as needed  Activities of Daily Living Home Assistive Devices/Equipment: Oxygen, Dentures (specify type), Eyeglasses (full set dentures, reading glasses) ADL Screening (condition at time of admission) Patient's cognitive ability adequate to  safely complete daily activities?: Yes Is the patient deaf or have difficulty hearing?: Yes Does the patient have difficulty seeing, even when wearing glasses/contacts?: No Does the patient have difficulty concentrating, remembering, or making decisions?: No Patient able to express need for assistance with ADLs?: Yes Does the patient have difficulty dressing or bathing?: Yes Independently performs ADLs?: No Communication: Independent Dressing (OT): Needs assistance Is this a change from baseline?: Change from baseline, expected to last >3 days Grooming: Independent Feeding: Independent Bathing: Needs assistance Is this  a change from baseline?: Change from baseline, expected to last >3 days Toileting: Needs assistance Is this a change from baseline?: Change from baseline, expected to last >3days In/Out Bed: Needs assistance Is this a change from baseline?: Change from baseline, expected to last >3 days Walks in Home: Dependent Is this a change from baseline?: Change from baseline, expected to last >3 days Does the patient have difficulty walking or climbing stairs?: Yes Weakness of Legs: Both Weakness of Arms/Hands: Both  Permission Sought/Granted Permission sought to share information with : Facility Sport and exercise psychologist, Family Supports    Share Information with NAME: Brooke Bonito     Permission granted to share info w Relationship: daughter  Permission granted to share info w Contact Information: [phone]  Emotional Assessment Appearance:: Appears stated age Attitude/Demeanor/Rapport: Gracious Affect (typically observed): Accepting, Pleasant Orientation: : Oriented to Self, Oriented to Place, Oriented to  Time, Oriented to Situation Alcohol / Substance Use: Not Applicable Psych Involvement: No (comment)  Admission diagnosis:  Dehydration [E86.0] Positive D dimer [R79.89] Generalized weakness [R53.1] AKI (acute kidney injury) ([place]) [N17.9] Patient Active Problem List   Diagnosis Date Noted  . Malignant neoplasm metastatic to bone ([place])   . Dehydration   . Pressure injury of skin 02/12/2020  . AKI (acute kidney injury) ([place]) 02/11/2020  . AAA (abdominal aortic aneurysm) without rupture ([place]) 09/15/2019  . H/O noncompliance with medical treatment, presenting hazards to health 09/04/2019  . History of prediabetes-  A1c 6.1 in December 2018 12/23/2018  . Solitary pulmonary nodule- R middle lobe- followed by pulm 08/20/2018  . Accelerated hypertension 08/08/2018  . Hyperlipidemia 12/13/2017  . Vitamin D insufficiency 12/13/2017  . Physical deconditioning 11/15/2017  . Hypertension  11/06/2017  . Stopped smoking 2008 with greater than 40 pack year history 11/06/2017  . Chronic fatigue 11/06/2017  . Dyspnea on minimal exertion-likely due to COPD 11/06/2017  . Chronic respiratory failure with hypoxia ([place]) 09/17/2017  . CAP (community acquired pneumonia) 07/14/2017  . COPD, group D, by GOLD 2017 classification ([place]) 07/14/2017  . Heart failure ([place]) 07/14/2017   PCP:  Patient, No Pcp Per Pharmacy:   [place], [place] [address] Phone: [phone] Fax: [phone]     Social Determinants of Health (SDOH) Interventions    Readmission Risk Interventions No flowsheet data found.

## 2020-02-18 NOTE — Progress Notes (Signed)
Daily Progress Note   Patient Name: George Frazier       Date: 02/18/2020 DOB: 1945-10-30  Age: 74 y.o. MRN#: [id number] Attending Physician: Debbe Odea, MD Primary Care Physician: Patient, No Pcp Per Admit Date: 02/11/2020  Reason for Consultation/Follow-up: Establishing goals of care  Subjective:  patient is awake alert resting in bed Complains of back pain.  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    Appetite is fair See below.   Length of Stay: 7  Current Medications: Scheduled Meds:  . amLODipine  10 mg Oral Daily  . atorvastatin  80 mg Oral q1800  . dexamethasone (DECADRON) injection  10 mg Intravenous Q12H  . enoxaparin (LOVENOX) injection  40 mg Subcutaneous Q24H  . feeding supplement (ENSURE ENLIVE)  237 mL Oral TID BM  . fluticasone furoate-vilanterol  1 puff Inhalation Daily   And  . umeclidinium bromide  1 puff Inhalation Daily    Continuous Infusions:   PRN Meds: acetaminophen **OR** acetaminophen, albuterol, hydrALAZINE, HYDROmorphone (DILAUDID) injection, ondansetron **OR** ondansetron (ZOFRAN) IV, oxyCODONE, senna-docusate  Physical Exam         Awake alert No distress mild back pain Regular work of breathing S1 S2 Abdomen not distended No edema Non focal   Vital Signs: BP (!) 143/84 (BP Location: Left Arm)   Pulse 87   Temp 98 F (36.7 C) (Oral)   Resp 17   Ht 6' (1.829 m)   Wt 78.5 kg   SpO2 99%   BMI 23.46 kg/m  SpO2: SpO2: 99 % O2 Device: O2 Device: Nasal Cannula O2 Flow Rate: O2 Flow Rate (L/min): 3 L/min  Intake/output summary:   Intake/Output Summary (Last 24 hours) at 02/18/2020 1525 Last data filed at 02/18/2020 2458 Gross per 24 hour  Intake 360 ml  Output 1450 ml  Net -1090 ml   LBM: Last  BM Date: 02/17/20 Baseline Weight: Weight: 78.5 kg Most recent weight: Weight: 78.5 kg       Palliative Assessment/Data:    Flowsheet Rows     Most Recent Value  Intake Tab  Referral Department Hospitalist  Unit at Time of Referral Med/Surg Unit  Palliative Care Primary Diagnosis Cancer  Date Notified 02/12/20  Date of Admission 02/11/20  Date first seen by Palliative Care 02/14/20  #  of days Palliative referral response time 2 Day(s)  # of days IP prior to Palliative referral 1  Clinical Assessment  Palliative Performance Scale Score 40%  Psychosocial & Spiritual Assessment  Palliative Care Outcomes  Patient/Family meeting held? Yes  Who was at the meeting? patient, daughter, nephew      Patient Active Problem List   Diagnosis Date Noted  . Pressure injury of skin 02/12/2020  . AKI (acute kidney injury) ([place]) 02/11/2020  . AAA (abdominal aortic aneurysm) without rupture ([place]) 09/15/2019  . H/O noncompliance with medical treatment, presenting hazards to health 09/04/2019  . History of prediabetes-  A1c 6.1 in December 2018 12/23/2018  . Solitary pulmonary nodule- R middle lobe- followed by pulm 08/20/2018  . Accelerated hypertension 08/08/2018  . Hyperlipidemia 12/13/2017  . Vitamin D insufficiency 12/13/2017  . Physical deconditioning 11/15/2017  . Hypertension 11/06/2017  . Stopped smoking 2008 with greater than 40 pack year history 11/06/2017  . Chronic fatigue 11/06/2017  . Dyspnea on minimal exertion-likely due to COPD 11/06/2017  . Chronic respiratory failure with hypoxia ([place]) 09/17/2017  . CAP (community acquired pneumonia) 07/14/2017  . COPD, group D, by GOLD 2017 classification ([place]) 07/14/2017  . Heart failure ([place]) 07/14/2017    Palliative Care Assessment & Plan   Patient Profile:  74 year old male with prior history of COPD on home oxygen, lung nodule, hypertension, chronic diastolic heart failure presents to ED for generalized weakness and decreased  appetite  Assessment:  generalized weakness, back pain, decreased appetite Constipation Hyper calcemia HTN Solitary pulmonary nodule T 5 para spinal mass biopsy, results pending.   Recommendations/Plan: Med history noted, discussed with patient. He is on Decadron, Tylenol and Dilaudid PRN. Discussed with patient about PO Oxy IR PRN.   Continue to monitor.   PT eval, likely SNF rehab with palliative on discharge.  PPS 40%  Code Status:    Code Status Orders  (From admission, onward)         Start     Ordered   02/11/20 1710  Do not attempt resuscitation (DNR)  Continuous       Question Answer Comment  In the event of cardiac or respiratory ARREST Do not call a "code blue"   In the event of cardiac or respiratory ARREST Do not perform Intubation, CPR, defibrillation or ACLS   In the event of cardiac or respiratory ARREST Use medication by any route, position, wound care, and other measures to relive pain and suffering. May use oxygen, suction and manual treatment of airway obstruction as needed for comfort.      02/11/20 1713        Code Status History    Date Active Date Inactive Code Status Order ID Comments User Context   08/08/2018 2207 08/10/2018 1708 Full Code [id number]  Bonnielee Haff, MD Inpatient   09/17/2017 1847 09/21/2017 1626 Full Code [id number]  Evans Lance, MD ED   07/14/2017 2049 07/18/2017 1847 DNR [id number]  Karmen Bongo, MD Inpatient   Advance Care Planning Activity       Prognosis:   Unable to determine  Discharge Planning:  To Be Determined Would encouragege patient to consider SNF with palliative   Care plan was discussed with  Patient.   Thank you for allowing the Palliative Medicine Team to assist in the care of this patient.   Time In: 1500 Time Out: 1525 Total Time 25 Prolonged Time Billed No       Greater than 50%  of this time  was spent counseling and coordinating care related to the above assessment and plan.  Loistine Chance,  MD  Please contact Palliative Medicine Team phone at [phone] for questions and concerns.

## 2020-02-18 NOTE — Progress Notes (Signed)
PROGRESS NOTE    George Frazier   ESP:[id number]  DOB: [birth year]  DOA: 02/11/2020 PCP: Patient, No Pcp Per   Brief Narrative:  George Frazier COPD Gold stage D on 2 L home oxygen, lung nodule, hypertension, chronic diastolic heart failure presents to ED for generalized weakness and decreased appetite x 2 wks.  In ED : SBP in 60-80, tachycardic with a lactic acid of 3.3, elevated Cr of 2.52, calcium 11.8 with normal albumin, dyspnea and a d dimer of > 20. Given 3 L ringer's lactate.   He also complained of back pain.  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 Bulging of tumor into the ventral spinal canal at the inferior T11 level, barely visible on this study  VQ scan: : Diffusely heterogeneous lung perfusion without discrete wedge-shaped perfusion defect.  7/29  CT chest abdomen pelvis:  - 10 mm spiculated right upper lobe pulmonary nodule - 5 mm posterior right upper lobe pulmonary nodule. Metastatic disease not excluded. Close attention on follow-up recommended. - 1.8 cm suspected lesion in the dome of the lateral segment left liver, not well evaluated on this noncontrast exam metastatic disease a concern. -Metastatic disease identified in the manubrium and bulky soft tissue metastatic deposit involves the right T5 pedicle, transverse process and posterior right fifth rib.  Subjective: No complaints. Pain in back is present and he is taking Tylenol for it. He tells me it is working but later told the palliative care doctor that his pain is getting worse.    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    Dispo: The patient is from: Home              Anticipated d/c is to: SNF              Anticipated d/c date is: 1 day              Patient currently is not medically stable to d/c.      Consultants:   Palliative care  oncology Procedures:   Biopsy of thoracic mass Antimicrobials:  Anti-infectives (From admission, onward)   None       Objective: Vitals:   02/18/20 0603 02/18/20 0915 02/18/20 0918 02/18/20 1312  BP: (!) 150/94   (!) 143/84  Pulse: 87  87  Resp: 17     Temp: 98.3 F (36.8 C)   98 F (36.7 C)  TempSrc: Oral   Oral  SpO2: 98% 93% 97% 99%  Weight:      Height:        Intake/Output Summary (Last 24 hours) at 02/18/2020 1519 Last data filed at 02/18/2020 0926 Gross per 24 hour  Intake 360 ml  Output 1450 ml  Net -1090 ml   Filed Weights   02/11/20 1151  Weight: 78.5 kg    Examination: General exam: Appears comfortable  HEENT: PERRLA, oral mucosa moist, no sclera icterus or thrush Respiratory system: Clear to auscultation. Respiratory effort normal. Cardiovascular system: S1 & S2 heard, RRR.   Gastrointestinal system: Abdomen soft, non-tender, nondistended. Normal bowel sounds. Central  nervous system: Alert and oriented. No focal neurological deficits. Extremities: No cyanosis, clubbing or edema Skin: No rashes or ulcers Psychiatry:  Mood & affect appropriate.     Data Reviewed: I have personally reviewed following labs and imaging studies  CBC: Recent Labs  Lab 02/12/20 0528 02/13/20 1032 02/16/20 0534 02/16/20 1928  WBC 7.8 9.8 11.7* 11.5*  HGB 11.8* 12.2* 11.8* 12.2*  HCT 38.2* 38.9* 37.7* 39.5  MCV 96.7 95.6 94.3 96.1  PLT 286 298 351 710   Basic Metabolic Panel: Recent Labs  Lab 02/12/20 0528 02/13/20 0530 02/13/20 1032 02/16/20 0534 02/18/20 0538  NA 143  --  140 143  --   K 5.0  --  4.5 4.2  --   CL 105  --  100 106  --   CO2 30  --  24 26  --   GLUCOSE 107*  --  105* 117*  --   BUN 71*  --  32* 41*  --   CREATININE 1.50*  --  1.07 1.00 0.94  CALCIUM 10.8* 10.5* 10.3 9.5  --    GFR: Estimated Creatinine Clearance: 75.7 mL/min (by C-G formula based on SCr of 0.94 mg/dL). Liver Function Tests: Recent Labs  Lab 02/12/20 0528 02/16/20 0534  AST 37 73*  ALT 23 48*  ALKPHOS 101 89  BILITOT 0.5 0.6  PROT 6.8 6.2*  ALBUMIN 3.3* 3.1*   No results for input(s): LIPASE, AMYLASE in the last 168 hours. No results for input(s): AMMONIA in the last 168 hours. Coagulation Profile: No results for input(s): INR, PROTIME in the last 168 hours. Cardiac Enzymes: No results for input(s): CKTOTAL, CKMB, CKMBINDEX, TROPONINI in the last 168 hours. BNP (last 3 results) No results for input(s): PROBNP in the last 8760 hours. HbA1C: No results for input(s): HGBA1C in the last 72 hours. CBG: No results for input(s): GLUCAP in the last 168 hours. Lipid Profile: No results for input(s): CHOL, HDL, LDLCALC, TRIG, CHOLHDL, LDLDIRECT in the last 72 hours. Thyroid Function Tests: No results for input(s): TSH, T4TOTAL, FREET4, T3FREE, THYROIDAB in the last 72 hours. Anemia Panel: No results for input(s): VITAMINB12, FOLATE, FERRITIN, TIBC, IRON, RETICCTPCT  in the last 72 hours. Urine analysis:    Component Value Date/Time   COLORURINE STRAW (A) 02/11/2020 2030   APPEARANCEUR CLEAR 02/11/2020 2030   LABSPEC 1.012 02/11/2020 2030   [place] 5.0 02/11/2020 2030   GLUCOSEU NEGATIVE 02/11/2020 2030   HGBUR SMALL (A) 02/11/2020 2030   [place] NEGATIVE 02/11/2020 2030   [place] 02/11/2020 2030   PROTEINUR NEGATIVE 02/11/2020 2030   NITRITE NEGATIVE 02/11/2020 2030   LEUKOCYTESUR NEGATIVE 02/11/2020 2030   Sepsis Labs: @LABRCNTIP (procalcitonin:4,lacticidven:4) ) Recent Results (from the  past 240 hour(s))  SARS Coronavirus 2 by RT PCR (hospital order, performed in [facility] hospital lab) Nasopharyngeal Nasopharyngeal Swab     Status: None   Collection Time: 02/11/20  1:24 PM   Specimen: Nasopharyngeal Swab  Result Value Ref Range Status   SARS Coronavirus 2 NEGATIVE NEGATIVE Final    Comment: (NOTE) SARS-CoV-2 target nucleic acids are NOT DETECTED.  The SARS-CoV-2 RNA is generally detectable in upper and lower respiratory specimens during the acute phase of infection. The lowest concentration of SARS-CoV-2 viral copies this assay can detect is 250 copies / mL. A negative result does not preclude SARS-CoV-2 infection and should not be used as the sole basis for treatment or other patient management decisions.  A negative result may occur with improper specimen collection / handling, submission of specimen other than nasopharyngeal swab, presence of viral mutation(s) within the areas targeted by this assay, and inadequate number of viral copies (<250 copies / mL). A negative result must be combined with clinical observations, patient history, and epidemiological information.  Fact Sheet for Patients:   StrictlyIdeas.no  Fact Sheet for Healthcare Providers: BankingDealers.co.za  This test is not yet approved or  cleared by the [place] FDA and has been authorized for  detection and/or diagnosis of SARS-CoV-2 by FDA under an Emergency Use Authorization (EUA).  This EUA will remain in effect (meaning this test can be used) for the duration of the COVID-19 declaration under Section 564(b)(1) of the Act, 21 U.S.C. section 360bbb-3(b)(1), unless the authorization is terminated or revoked sooner.  Performed at [facility], [place] [address] [place], [place] [address]   Urine culture     Status: Abnormal   Collection Time: 02/11/20  8:30 PM   Specimen: Urine, Clean Catch  Result Value Ref Range Status   Specimen Description   Final    URINE, CLEAN CATCH Performed at [facility], [place] [address], [place], [place] [address]    Special Requests   Final    NONE Performed at [facility], [place] [address] [place], [place] [address]    Culture (A)  Final    <10,000 COLONIES/mL INSIGNIFICANT GROWTH Performed at [place] [address] [place], [place] [address]    Report Status 02/12/2020 FINAL  Final      Radiology Studies: No results found.    Scheduled Meds: . amLODipine  10 mg Oral Daily  . atorvastatin  80 mg Oral q1800  . dexamethasone (DECADRON) injection  10 mg Intravenous Q12H  . enoxaparin (LOVENOX) injection  40 mg Subcutaneous Q24H  . feeding supplement (ENSURE ENLIVE)  237 mL Oral TID BM  . fluticasone furoate-vilanterol  1 puff Inhalation Daily   And  . umeclidinium bromide  1 puff Inhalation Daily   Continuous Infusions:   LOS: 7 days      Debbe Odea, MD [facility] Pager: [email or web address] 02/18/2020, 3:19 PM

## 2020-02-18 NOTE — Consult Note (Signed)
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 Underwent MRI of the Lumbar spine which revealed extensive metastatic tumor or myeloma throughout the spine at all levels imaged, from inferior T11 through S1 2) 02/12/2020: MR thoracic spine revealed complete involvement of the T11 vertebral body with associated pathologic compression fracture, with 5 mm of retropulsed bone and/or tumor protruding into the spinal canal. Resultant mild spinal stenosis without frank cord compression or cord signal changes 3) 02/12/2020: CT C/A/P WO contrast revealed 10 mm spiculated right upper lobe pulmonary nodule, 5 mm posterior right upper lobe pulmonary nodule, 1.8 cm suspected lesion in the dome of the lateral segment left liver 4) 02/16/2020: CT-guided core biopsy of T5 paraspinal mass, results consistent with adenocarcinoma (TTF-1 positive) 5) 02/18/2020: establish care with Dr. Lorenso Courier in the hospital  CHIEF COMPLAINTS/PURPOSE OF CONSULTATION:  "Metastatic Adenocarcinoma, like Lung Origin "  HISTORY OF PRESENTING ILLNESS:  Guido Comp 74 y.o. 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   On review of the previous records history Gatta additionally presented the emergency department on 02/11/2020 with gradual weakness and worsening appetite.  He underwent an MRI of the lumbar spine which revealed extensive metastatic tumor throughout the spine at all levels  imaged.  On 02/12/2020 he underwent MRI of thoracic spine which revealed complete involvement of the T11 vertebra associated with compression fracture.  Additionally on that day he underwent a CT chest abdomen pelvis without contrast which revealed a 10 mm spiculated right upper lobe nodule as well as a 5 mm posterior right upper lobe nodule and a 1.8 cm suspected lesion in the dome of the lateral segment of the left liver.  On 02/16/2020 the patient underwent a CT-guided core biopsy of the T5 paraspinal mass results consistent with adenocarcinoma.  This is thought to be secondary to lung or pancreaticobiliary origin.  On 02/18/2020 oncology was consulted for further evaluation and management.  On exam today Mr. Duffner is accompanied by his daughter and his nephew.  He notes that he originally came in because he could not walk and he simply could not get out of bed.  He notes he was also having midline back pain which was significantly worsened.  He notes that he was not having any issues with fevers, chills, sweats, or diarrhea at the time of admission.  He is concerned about his markedly poor appetite as well as nausea that he experiences with the smell of any food.  He notes that he has vomited several times upon smelling any food that is fragment.  He has been eating very little as result of this nausea.  He otherwise denies having any headache, vision changes, or lightheadedness or confusion.  On further discussion the patient reports that he served in [place] and had agent orange exposure.  He also notes that he quit smoking about 16 years ago but smoked for 40 years at 1 pack/day.  He also notes that he had a great uncle with lung cancer, but no other family history remarkable for cancer.  He notes that he is at his baseline level of shortness of breath and oxygen requirement.  A full 10 point ROS is listed below.  Of note the spiculated mass was noted in the chest in August 2020 during evaluation with a PET  CT scan.  On 03/05/2019 it was recommended by Dr. Vaughan Browner that the patient undergo bronchoscopy with navigational biopsy, however the patient was concerned about the complications of this procedure and this was not scheduled.  A 83-monthfollow-up CT chest without contrast was planned, however this was never performed.  MEDICAL HISTORY:  Past Medical History:  Diagnosis Date  . CHF (congestive heart failure) ([place]   . COPD (chronic obstructive pulmonary disease) ([facility]   . Hypertension     SURGICAL HISTORY: History reviewed. No pertinent surgical history.  SOCIAL HISTORY: Social History   Socioeconomic History  . Marital status: Single    Spouse name: Not on file  . Number of children: Not on file  . Years of education: Not on file  . Highest education level: Not on file  Occupational History  . Occupation: retired  Tobacco Use  . Smoking status: Former Smoker    Packs/day: 1.00    Years: 40.00    Pack years: 40.00    Quit date: 2008    Years since quitting: 13.6  . Smokeless tobacco: Former UNetwork engineer . Vaping Use: Never used  Substance and Sexual Activity  . Alcohol use: No  . Drug use: No  . Sexual activity: Never  Other Topics Concern  . Not on file  Social History Narrative  . Not on file   Social Determinants of Health   Financial Resource Strain:   . Difficulty of Paying Living Expenses:   Food Insecurity:   . Worried About RCharity fundraiserin the Last Year:   . RArboriculturistin the Last Year:   Transportation Needs:   . LFilm/video editor(Medical):   .Marland KitchenLack of Transportation (Non-Medical):   Physical Activity:   . Days of Exercise per Week:   . Minutes of Exercise per Session:   Stress:   . Feeling of Stress :   Social Connections:   . Frequency of Communication with Friends and Family:   . Frequency of Social Gatherings with Friends and Family:   . Attends Religious Services:   . Active Member of Clubs or Organizations:   . Attends  CArchivistMeetings:   .Marland KitchenMarital Status:   Intimate Partner Violence:   . Fear of Current or Ex-Partner:   . Emotionally Abused:   .Marland KitchenPhysically Abused:   . Sexually Abused:     FAMILY HISTORY: Family History  Problem Relation Age of Onset  . CAD Mother 873 . Hypertension Mother   . Stroke Mother   . COPD Father 659 . AAA (abdominal aortic aneurysm) Father   . COPD Brother   . COPD Brother   . Cancer Neg Hx     ALLERGIES:  has No Known Allergies.  MEDICATIONS:  Current Facility-Administered Medications  Medication Dose Route Frequency Provider Last Rate Last Admin  . acetaminophen (TYLENOL) tablet 650 mg  650 mg Oral Q6H PRN Kc, Ramesh, MD   650 mg at 02/18/20 0618   Or  . acetaminophen (TYLENOL) suppository 650 mg  650 mg Rectal Q6H PRN Kc, Ramesh, MD      . albuterol (PROVENTIL) (2.5 MG/3ML) 0.083% nebulizer solution 2.5 mg  2.5  mg Nebulization Q2H PRN Antonieta Pert, MD   2.5 mg at 02/14/20 2204  . amLODipine (NORVASC) tablet 10 mg  10 mg Oral Daily Hosie Poisson, MD   10 mg at 02/18/20 1217  . atorvastatin (LIPITOR) tablet 80 mg  80 mg Oral q1800 Antonieta Pert, MD   80 mg at 02/17/20 1830  . dexamethasone (DECADRON) injection 10 mg  10 mg Intravenous Q12H Hosie Poisson, MD   10 mg at 02/17/20 2229  . enoxaparin (LOVENOX) injection 40 mg  40 mg Subcutaneous Q24H Hosie Poisson, MD   40 mg at 02/17/20 2020  . feeding supplement (ENSURE ENLIVE) (ENSURE ENLIVE) liquid 237 mL  237 mL Oral TID BM Domingo Cocking, Gene, MD   237 mL at 02/18/20 1640  . fluticasone furoate-vilanterol (BREO ELLIPTA) 100-25 MCG/INH 1 puff  1 puff Inhalation Daily Kc, Ramesh, MD   1 puff at 02/18/20 0915   And  . umeclidinium bromide (INCRUSE ELLIPTA) 62.5 MCG/INH 1 puff  1 puff Inhalation Daily Kc, Ramesh, MD   1 puff at 02/18/20 0915  . hydrALAZINE (APRESOLINE) tablet 25 mg  25 mg Oral Q8H PRN Antonieta Pert, MD   25 mg at 02/17/20 0559  . HYDROmorphone (DILAUDID) injection 0.5 mg  0.5 mg Intravenous Q4H PRN  Micheline Rough, MD      . ondansetron (ZOFRAN) tablet 4 mg  4 mg Oral Q6H PRN Kc, Ramesh, MD       Or  . ondansetron (ZOFRAN) injection 4 mg  4 mg Intravenous Q6H PRN Kc, Ramesh, MD      . oxyCODONE (Oxy IR/ROXICODONE) immediate release tablet 5 mg  5 mg Oral Q3H PRN Loistine Chance, MD   5 mg at 02/18/20 1640  . senna-docusate (Senokot-S) tablet 1 tablet  1 tablet Oral QHS PRN Kc, Ramesh, MD        REVIEW OF SYSTEMS:   Constitutional: ( - ) fevers, ( - )  chills , ( - ) night sweats Eyes: ( - ) blurriness of vision, ( - ) double vision, ( - ) watery eyes Ears, nose, mouth, throat, and face: ( - ) mucositis, ( - ) sore throat Respiratory: ( - ) cough, ( + ) dyspnea, ( - ) wheezes Cardiovascular: ( - ) palpitation, ( - ) chest discomfort, ( - ) lower extremity swelling Gastrointestinal:  ( +++ ) nausea, ( - ) heartburn, ( - ) change in bowel habits Skin: ( - ) abnormal skin rashes Lymphatics: ( - ) new lymphadenopathy, ( - ) easy bruising Neurological: ( - ) numbness, ( - ) tingling, ( - ) new weaknesses Behavioral/Psych: ( - ) mood change, ( - ) new changes  All other systems were reviewed with the patient and are negative.  PHYSICAL EXAMINATION: ECOG PERFORMANCE STATUS: 4 - Bedbound  Vitals:   02/18/20 0918 02/18/20 1312  BP:  (!) 143/84  Pulse:  87  Resp:  17  Temp:  98 F (36.7 C)  SpO2: 97% 99%   Filed Weights   02/11/20 1151  Weight: 173 lb (78.5 kg)    GENERAL: chronically ill appearing elderly Caucasian male in NAD  SKIN: skin color, texture, turgor are normal, no rashes or significant lesions EYES: conjunctiva are pink and non-injected, sclera clear OROPHARYNX: no exudate, no erythema; lips, buccal mucosa, and tongue normal. Edentulous.  LUNGS: clear to auscultation and percussion with normal breathing effort HEART: regular rate & rhythm and no murmurs and no lower extremity edema ABDOMEN: soft, non-tender, non-distended,  normal bowel sounds Musculoskeletal: no  cyanosis of digits and no clubbing  PSYCH: alert & oriented x 3, fluent speech NEURO: no focal motor/sensory deficits  LABORATORY DATA:  I have reviewed the data as listed CBC Latest Ref Rng & Units 02/16/2020 02/16/2020 02/13/2020  WBC 4.0 - 10.5 K/uL 11.5(H) 11.7(H) 9.8  Hemoglobin 13.0 - 17.0 g/dL 12.2(L) 11.8(L) 12.2(L)  Hematocrit 39 - 52 % 39.5 37.7(L) 38.9(L)  Platelets 150 - 400 K/uL 322 351 298    CMP Latest Ref Rng & Units 02/18/2020 02/16/2020 02/13/2020  Glucose 70 - 99 mg/dL - 117(H) 105(H)  BUN 8 - 23 mg/dL - 41(H) 32(H)  Creatinine 0.61 - 1.24 mg/dL 0.94 1.00 1.07  Sodium 135 - 145 mmol/L - 143 140  Potassium 3.5 - 5.1 mmol/L - 4.2 4.5  Chloride 98 - 111 mmol/L - 106 100  CO2 22 - 32 mmol/L - 26 24  Calcium 8.9 - 10.3 mg/dL - 9.5 10.3  Total Protein 6.5 - 8.1 g/dL - 6.2(L) -  Total Bilirubin 0.3 - 1.2 mg/dL - 0.6 -  Alkaline Phos 38 - 126 U/L - 89 -  AST 15 - 41 U/L - 73(H) -  ALT 0 - 44 U/L - 48(H) -     PATHOLOGY: SURGICAL PATHOLOGY  CASE: [id number]  PATIENT: Merry Proud  Surgical Pathology Report   Clinical History: Multifocal osseous lesions, Multiple myeloma vs. mets  (crm)    FINAL MICROSCOPIC DIAGNOSIS:   A. PARASPINAL MASS, RIGHT T5, NEEDLE CORE BIOPSY:  - Metastatic adenocarcinoma  - See comment   COMMENT:   By immunohistochemistry, the neoplastic cells are positive for  cytokeratin 7 and focally positive for TTF-1 but negative for  cytokeratin 20, CDX2, PSA and prostein. Overall, the differential  diagnosis includes a primary lung and pancreatobiliary. Napsin-A by  immunohistochemistry is pending and will be reported separately. Dr.  Saralyn Pilar reviewed the case and agrees with the above diagnosis. These  results were called to Dr. Wynelle Cleveland on February 18, 2020.    GROSS DESCRIPTION:   The specimen is received in formalin and consists of 2 cores and 2 core  fragments of tan-red soft tissue, ranging from 0.1 cm to 1.8 cm in  length by 0.1  cm in diameter. The specimen is entirely submitted in 2  cassettes. Craig Staggers 02/18/2020)    Final Diagnosis performed by Thressa Sheller, MD.  Electronically signed  02/18/2020  Technical component performed at [facility], [place]  [address], [place], [place] [address].    RADIOGRAPHIC STUDIES: I have personally reviewed the radiological images as listed and agreed with the findings in the report: two small lung lesions, lesion in the caudate lobe of lung. Extensive osseus metastatic disease.  CT ABDOMEN PELVIS WO CONTRAST  Result Date: 02/13/2020 CLINICAL DATA:  Metastatic disease of unknown primary. EXAM: CT CHEST, ABDOMEN AND PELVIS WITHOUT CONTRAST TECHNIQUE: Multidetector CT imaging of the chest, abdomen and pelvis was performed following the standard protocol without IV contrast. COMPARISON:  CT chest 02/19/2019. FINDINGS: CT CHEST FINDINGS Cardiovascular: The heart size is normal. No substantial pericardial effusion. Coronary artery calcification is evident. Atherosclerotic calcification is noted in the wall of the thoracic aorta. Mediastinum/Nodes: No mediastinal lymphadenopathy. No evidence for gross hilar lymphadenopathy although assessment is limited by the lack of intravenous contrast on today's study. The esophagus has normal imaging features. There is no axillary lymphadenopathy. Lungs/Pleura: Centrilobular and paraseptal emphysema evident. Bullous change noted right lung apex with biapical pleuroparenchymal scarring. 10  mm spiculated right upper lobe pulmonary nodule identified on image 43/series 4. 5 mm posterior right upper lobe nodule evident on 58/4. Scattered areas of architectural distortion and parenchymal scarring noted. Circumferential bronchial wall thickening evident bilaterally with substantial peripheral airway impaction in the posterior right lower lobe. Atelectasis noted posterior costophrenic sulci. Musculoskeletal: Lytic lesions are identified in the manubrium  measuring up to 13 mm (sagittal image 96/series 6). T11 compression fracture evident. 4.4 x 3.4 cm soft tissue mass destroys the right T5 pedicle and transverse process with involvement of the posterior right fifth rib. CT ABDOMEN PELVIS FINDINGS Hepatobiliary: Not well demonstrated on this noncontrast study, suspicion for a 1.8 cm lesion in the dome of the lateral segment left liver (narrow windows on axial 56/2). Small area of low attenuation in the anterior liver, adjacent to the falciform ligament, is in a characteristic location for focal fatty deposition. There is no evidence for gallstones, gallbladder wall thickening, or pericholecystic fluid. No intrahepatic or extrahepatic biliary dilation. Pancreas: No focal mass lesion. No dilatation of the main duct. No intraparenchymal cyst. No peripancreatic edema. Spleen: No splenomegaly. No focal mass lesion. Adrenals/Urinary Tract: No adrenal nodule or mass. Left kidney has unremarkable noncontrast appearance. Cystic lesion in the upper pole right kidney likely focally dilated upper pole calyx although central sinus cysts could have this appearance. No evidence for hydroureter. The urinary bladder appears normal for the degree of distention. Stomach/Bowel: Stomach is unremarkable. No gastric wall thickening. No evidence of outlet obstruction. Duodenum is normally positioned as is the ligament of Treitz. No small bowel wall thickening. No small bowel dilatation. The terminal ileum is normal. The appendix is normal. No gross colonic mass. No colonic wall thickening. Diverticular changes are noted in the left colon without evidence of diverticulitis. Vascular/Lymphatic: Saccular aneurysm identified infrarenal abdominal aorta measuring up to 3.2 cm in maximum diameter. There is no gastrohepatic or hepatoduodenal ligament lymphadenopathy. No retroperitoneal or mesenteric lymphadenopathy. No pelvic sidewall lymphadenopathy. Reproductive: Prostate gland is enlarged.  Other: No intraperitoneal free fluid. Musculoskeletal: Right groin hernia contains distal ileal loops without obstruction or complicating features. Destructive tumor identified at S1, better characterized on recent MRI. Additional bony metastatic disease also better assessed at MRI. IMPRESSION: 1. 10 mm spiculated right upper lobe pulmonary nodule. Primary bronchogenic neoplasm versus metastatic deposit. 2. 5 mm posterior right upper lobe pulmonary nodule. Metastatic disease not excluded. Close attention on follow-up recommended. 3. Circumferential bronchial wall thickening with substantial peripheral airway impaction in the posterior right lower lobe. Imaging features may be related to atypical infection although aspiration could have this appearance in the appropriate clinical setting. 4. 1.8 cm suspected lesion in the dome of the lateral segment left liver, not well evaluated on this noncontrast exam metastatic disease a concern. 5. Metastatic disease identified in the manubrium and bulky soft tissue metastatic deposit involves the right T5 pedicle, transverse process and posterior right fifth rib. 6. 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 7. Right groin hernia contains distal ileal loops without obstruction or complicating features. 8. Saccular infrarenal abdominal aortic aneurysm measuring up to 3.2 cm in maximum diameter. Attention on follow-up recommended. 9. Prostatomegaly. 10. Aortic Atherosclerosis (ICD10-I70.0) and Emphysema (ICD10-J43.9). Electronically Signed   By: Misty Stanley M.D.   On: 02/13/2020 08:11   DG Chest 2 View  Result Date: 02/11/2020 CLINICAL DATA:  Shortness of breath. EXAM: CHEST - 2 VIEW COMPARISON:  August 08, 2018. FINDINGS: The heart size and mediastinal contours are within  normal limits. Both lungs are clear. No pneumothorax or pleural effusion is noted. The visualized skeletal structures are unremarkable. IMPRESSION: No active  cardiopulmonary disease. Aortic Atherosclerosis (ICD10-I70.0). Electronically Signed   By: Marijo Conception M.D.   On: 02/11/2020 12:24   CT CHEST WO CONTRAST  Result Date: 02/13/2020 CLINICAL DATA:  Metastatic disease of unknown primary. EXAM: CT CHEST, ABDOMEN AND PELVIS WITHOUT CONTRAST TECHNIQUE: Multidetector CT imaging of the chest, abdomen and pelvis was performed following the standard protocol without IV contrast. COMPARISON:  CT chest 02/19/2019. FINDINGS: CT CHEST FINDINGS Cardiovascular: The heart size is normal. No substantial pericardial effusion. Coronary artery calcification is evident. Atherosclerotic calcification is noted in the wall of the thoracic aorta. Mediastinum/Nodes: No mediastinal lymphadenopathy. No evidence for gross hilar lymphadenopathy although assessment is limited by the lack of intravenous contrast on today's study. The esophagus has normal imaging features. There is no axillary lymphadenopathy. Lungs/Pleura: Centrilobular and paraseptal emphysema evident. Bullous change noted right lung apex with biapical pleuroparenchymal scarring. 10 mm spiculated right upper lobe pulmonary nodule identified on image 43/series 4. 5 mm posterior right upper lobe nodule evident on 58/4. Scattered areas of architectural distortion and parenchymal scarring noted. Circumferential bronchial wall thickening evident bilaterally with substantial peripheral airway impaction in the posterior right lower lobe. Atelectasis noted posterior costophrenic sulci. Musculoskeletal: Lytic lesions are identified in the manubrium measuring up to 13 mm (sagittal image 96/series 6). T11 compression fracture evident. 4.4 x 3.4 cm soft tissue mass destroys the right T5 pedicle and transverse process with involvement of the posterior right fifth rib. CT ABDOMEN PELVIS FINDINGS Hepatobiliary: Not well demonstrated on this noncontrast study, suspicion for a 1.8 cm lesion in the dome of the lateral segment left liver  (narrow windows on axial 56/2). Small area of low attenuation in the anterior liver, adjacent to the falciform ligament, is in a characteristic location for focal fatty deposition. There is no evidence for gallstones, gallbladder wall thickening, or pericholecystic fluid. No intrahepatic or extrahepatic biliary dilation. Pancreas: No focal mass lesion. No dilatation of the main duct. No intraparenchymal cyst. No peripancreatic edema. Spleen: No splenomegaly. No focal mass lesion. Adrenals/Urinary Tract: No adrenal nodule or mass. Left kidney has unremarkable noncontrast appearance. Cystic lesion in the upper pole right kidney likely focally dilated upper pole calyx although central sinus cysts could have this appearance. No evidence for hydroureter. The urinary bladder appears normal for the degree of distention. Stomach/Bowel: Stomach is unremarkable. No gastric wall thickening. No evidence of outlet obstruction. Duodenum is normally positioned as is the ligament of Treitz. No small bowel wall thickening. No small bowel dilatation. The terminal ileum is normal. The appendix is normal. No gross colonic mass. No colonic wall thickening. Diverticular changes are noted in the left colon without evidence of diverticulitis. Vascular/Lymphatic: Saccular aneurysm identified infrarenal abdominal aorta measuring up to 3.2 cm in maximum diameter. There is no gastrohepatic or hepatoduodenal ligament lymphadenopathy. No retroperitoneal or mesenteric lymphadenopathy. No pelvic sidewall lymphadenopathy. Reproductive: Prostate gland is enlarged. Other: No intraperitoneal free fluid. Musculoskeletal: Right groin hernia contains distal ileal loops without obstruction or complicating features. Destructive tumor identified at S1, better characterized on recent MRI. Additional bony metastatic disease also better assessed at MRI. IMPRESSION: 1. 10 mm spiculated right upper lobe pulmonary nodule. Primary bronchogenic neoplasm versus  metastatic deposit. 2. 5 mm posterior right upper lobe pulmonary nodule. Metastatic disease not excluded. Close attention on follow-up recommended. 3. Circumferential bronchial wall thickening with substantial peripheral airway impaction in the  posterior right lower lobe. Imaging features may be related to atypical infection although aspiration could have this appearance in the appropriate clinical setting. 4. 1.8 cm suspected lesion in the dome of the lateral segment left liver, not well evaluated on this noncontrast exam metastatic disease a concern. 5. Metastatic disease identified in the manubrium and bulky soft tissue metastatic deposit involves the right T5 pedicle, transverse process and posterior right fifth rib. 6. 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 7. Right groin hernia contains distal ileal loops without obstruction or complicating features. 8. Saccular infrarenal abdominal aortic aneurysm measuring up to 3.2 cm in maximum diameter. Attention on follow-up recommended. 9. Prostatomegaly. 10. Aortic Atherosclerosis (ICD10-I70.0) and Emphysema (ICD10-J43.9). Electronically Signed   By: Misty Stanley M.D.   On: 02/13/2020 08:11   MR THORACIC SPINE WO CONTRAST  Result Date: 02/12/2020 CLINICAL DATA:  Initial evaluation for mid back pain. EXAM: MRI THORACIC SPINE WITHOUT CONTRAST TECHNIQUE: Multiplanar, multisequence MR imaging of the thoracic spine was performed. No intravenous contrast was administered. COMPARISON:  Prior MRI of the lumbar spine from 02/11/2020. FINDINGS: Alignment: Exaggeration of the normal thoracic kyphosis with underlying mild dextroscoliosis. No listhesis. Vertebrae: Abnormal marrow signal intensity consistent with metastatic disease or possibly myeloma seen throughout the thoracic spine, with involvement of nearly all levels. Involvement most pronounced within the mid and lower thoracic spine extending from T7 inferiorly. Complete  involvement of the T11 vertebral body with associated pathologic compression fracture, with height loss measuring up to 75%. 5 mm of retropulsed bone and/or tumor protrudes into the spinal canal. Resultant mild spinal stenosis with minimal flattening of the ventral spinal cord, but no frank cord compression or cord signal changes. Probable additional compression fracture noted extending through the superior endplate of L1, partially visualized, better evaluated on previous lumbar MRI. No other pathologic fracture identified. Prominent tumor involvement of the right posterior elements noted at T5 (series 25, image 8). No other significant extra osseous or epidural tumor identified. Probable scattered rib involvement noted as well. Cord: Signal intensity within the thoracic spinal cord is within normal limits. Paraspinal and other soft tissues: Soft tissue edema noted within the right upper posterior back at the level of T4-5 related to prominent tumor burden involving the adjacent right T5 posterior elements (series 21, image 5). Paraspinous soft tissues demonstrate no other acute finding. Trace layering bilateral pleural effusions noted. Disc levels: No significant disc pathology seen within the thoracic spine. No other significant canal or foraminal stenosis at this time. IMPRESSION: 1. Abnormal marrow signal intensity throughout the thoracic spine, consistent with metastatic disease or myeloma. 2. Complete involvement of the T11 vertebral body with associated pathologic compression fracture, with 5 mm of retropulsed bone and/or tumor protruding into the spinal canal. Resultant mild spinal stenosis without frank cord compression or cord signal changes. 3. Additional prominent tumor involvement involving the right posterior elements of T5 with associated adjacent paraspinous edema. 4. No other significant epidural or extraosseous tumor identified. No significant disc pathology or stenosis. Electronically Signed    By: Jeannine Boga M.D.   On: 02/12/2020 22:52   MR LUMBAR SPINE WO CONTRAST  Result Date: 02/11/2020 CLINICAL DATA:  Low back pain with difficulty walking, persistently worsening. EXAM: MRI LUMBAR SPINE WITHOUT CONTRAST TECHNIQUE: Multiplanar, multisequence MR imaging of the lumbar spine was performed. No intravenous contrast was administered. COMPARISON:  None. FINDINGS: Segmentation:  5 lumbar type vertebral bodies. Alignment:  Normal Vertebrae: Metastatic disease or myeloma throughout the  region with involvement each vertebral level from inferior T11 through S1. Extensive confluent disease replacing much of the marrow. Minor superior endplate deformity at L1 which could be at a fracture related to the underlying tumor or could be chronic. Extraosseous tumor extension at the S1 level involving the ventral epidural space. Lesion also seen within the right iliac bone. At the barely visible inferior T11 level, it appears that tumor bulges into the anterior aspect of the spinal canal. This was not studied completely. There is some potential that this could affect the distal thoracic cord. Conus medullaris and cauda equina: Conus extends to the T12-L1 level. Conus and cauda equina appear normal. Paraspinal and other soft tissues: Negative. No visible retroperitoneal adenopathy. Question focal aneurysm of the infrarenal abdominal aorta measuring up to 3.5 cm. This was not fully evaluated. Disc levels: No disc pathology at L2-3 or above. L3-4: Mild bulging of the disc.  No compressive stenosis. L5-S1: Chronic fusion across this disc space. Sufficient patency of the canal and foramina. As noted above, there is ventral epidural bulging of tumor at the S1 level but this does not cause visible neural compression. L4-5: Disc bulge. Mild facet and ligamentous hypertrophy. Mild bilateral lateral recess stenosis. 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 Bulging of tumor into the ventral spinal canal at the inferior T11 level, barely visible on this study. Complete spinal imaging recommended. Bulging of tumor at the S1 level, encroaching upon the ventral epidural space but without gross neural compression. Electronically Signed   By: Nelson Chimes M.D.   On: 02/11/2020 19:36   NM Pulmonary Perfusion  Result Date: 02/11/2020 CLINICAL DATA:  PE suspected, high prob Shortness of breath.  Weakness. EXAM: NUCLEAR MEDICINE PERFUSION LUNG SCAN TECHNIQUE: Perfusion images were obtained in multiple projections after intravenous injection of radiopharmaceutical. Ventilation scans intentionally deferred if perfusion scan and chest x-ray adequate for interpretation during COVID 19 epidemic. RADIOPHARMACEUTICALS:  3.8 mCi Tc-77mMAA IV COMPARISON:  Chest radiograph earlier this day. FINDINGS: Lung perfusion is diffusely heterogeneous. There are no discrete wedge-shaped perfusion defects. IMPRESSION: Diffusely heterogeneous lung perfusion without discrete wedge-shaped perfusion defect. No scintigraphic evidence of pulmonary embolus. Electronically Signed   By: MKeith RakeM.D.   On: 02/11/2020 16:50   [place]  Result Date: 02/11/2020 CLINICAL DATA:  Acute kidney injury EXAM: RENAL / URINARY TRACT ULTRASOUND COMPLETE COMPARISON:  Ultrasound 09/18/2017 FINDINGS: Right Kidney: Renal measurements: 9.1 by 5 x 5.2 cm = volume: 125 mL. Cortical echogenicity within normal limits. No hydronephrosis. 4.2 x 2.9 x 4.2 cm parapelvic cyst, previously 4.3 cm. Left Kidney: Renal measurements: 8.8 x 4.6 x 4.7 cm = volume: 100 mL. Echogenicity within normal limits. No mass or hydronephrosis visualized. Bladder: Appears normal for degree of bladder distention. Other: Bladder volume of 473 mL. Patient unable to void to get postvoid residual. IMPRESSION: 1. Negative for hydronephrosis. 2. Similar 4.3 cm right parapelvic cyst Electronically Signed   By: KDonavan FoilM.D.    On: 02/11/2020 16:30   CT BIOPSY  Result Date: 02/16/2020 INDICATION: [age]year old male with multifocal lytic osseous lesions concerning for metastatic disease. Primary malignancy is uncertain at this time. EXAM: CT-guided bone biopsy MEDICATIONS: None. ANESTHESIA/SEDATION: Moderate (conscious) sedation was employed during this procedure. A total of Versed 2 mg and Fentanyl 100 mcg was administered intravenously. Moderate Sedation Time: 13 minutes. The patient's level of consciousness and vital signs were monitored continuously by radiology  nursing throughout the procedure under my direct supervision. FLUOROSCOPY TIME:  None COMPLICATIONS: None immediate. PROCEDURE: Informed written consent was obtained from the patient after a thorough discussion of the procedural risks, benefits and alternatives. All questions were addressed. Maximal Sterile Barrier Technique was utilized including caps, mask, sterile gowns, sterile gloves, sterile drape, hand hygiene and skin antiseptic. A timeout was performed prior to the initiation of the procedure. The patient was placed in the prone position. A planning axial CT imaging was obtained. The soft tissue mass emanating from the posterior aspect of the right fifth rib and adjacent T5 vertebral body was successfully identified. A suitable skin entry site was selected and marked. Sterile prep and drape was then performed using chlorhexidine skin prep. Local anesthesia was attained by infiltration with 1% lidocaine. A small dermatotomy was made. Under intermittent CT guidance, a 17 gauge introducer needle was advanced into the margin of the mass. Multiple 18 gauge core biopsies were then obtained using the bio Pince automated biopsy device. Biopsy specimens were placed in formalin and delivered to pathology for further analysis. The biopsy device was removed. Hemostasis was attained by gentle manual pressure. The patient tolerated the procedure well. IMPRESSION: Technically  successful CT-guided core biopsy of T5 paraspinal mass. Electronically Signed   By: Jacqulynn Cadet M.D.   On: 02/16/2020 13:53   VAS [place] LOWER EXTREMITY VENOUS (DVT)  Result Date: 02/14/2020  Lower Venous DVTStudy Indications: Elevated ddimer.  Comparison Study: no prior Performing Technologist: Abram Sander RVS  Examination Guidelines: A complete evaluation includes B-mode imaging, spectral Doppler, color Doppler, and power Doppler as needed of all accessible portions of each vessel. Bilateral testing is considered an integral part of a complete examination. Limited examinations for reoccurring indications may be performed as noted. The reflux portion of the exam is performed with the patient in reverse Trendelenburg.  +---------+---------------+---------+-----------+----------+--------------+ RIGHT    CompressibilityPhasicitySpontaneityPropertiesThrombus Aging +---------+---------------+---------+-----------+----------+--------------+ CFV      Full           Yes      Yes                                 +---------+---------------+---------+-----------+----------+--------------+ SFJ      Full                                                        +---------+---------------+---------+-----------+----------+--------------+ FV Prox  Full                                                        +---------+---------------+---------+-----------+----------+--------------+ FV Mid   Full                                                        +---------+---------------+---------+-----------+----------+--------------+ FV DistalFull                                                        +---------+---------------+---------+-----------+----------+--------------+  PFV      Full                                                        +---------+---------------+---------+-----------+----------+--------------+ POP      Full           Yes      Yes                                  +---------+---------------+---------+-----------+----------+--------------+ PTV      Full                                                        +---------+---------------+---------+-----------+----------+--------------+ PERO     Full                                                        +---------+---------------+---------+-----------+----------+--------------+   +---------+---------------+---------+-----------+----------+--------------+ LEFT     CompressibilityPhasicitySpontaneityPropertiesThrombus Aging +---------+---------------+---------+-----------+----------+--------------+ CFV      Full           Yes      Yes                                 +---------+---------------+---------+-----------+----------+--------------+ SFJ      Full                                                        +---------+---------------+---------+-----------+----------+--------------+ FV Prox  Full                                                        +---------+---------------+---------+-----------+----------+--------------+ FV Mid   Full                                                        +---------+---------------+---------+-----------+----------+--------------+ FV DistalFull                                                        +---------+---------------+---------+-----------+----------+--------------+ PFV      Full                                                        +---------+---------------+---------+-----------+----------+--------------+  POP      Full           Yes      Yes                                 +---------+---------------+---------+-----------+----------+--------------+ PTV      Full                                                        +---------+---------------+---------+-----------+----------+--------------+ PERO     Full                                                         +---------+---------------+---------+-----------+----------+--------------+     Summary: BILATERAL: - No evidence of deep vein thrombosis seen in the lower extremities, bilaterally. - No evidence of superficial venous thrombosis in the lower extremities, bilaterally. -   *See table(s) above for measurements and observations. Electronically signed by Deitra Mayo MD on 02/14/2020 at 4:18:52 AM.    Final     ASSESSMENT & PLAN [name] 74 y.o. 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  After review the labs, review the imaging, review the pathology, and discussion with the patient the findings are most consistent with an adenocarcinoma of lung origin.  At this time the patient is not an ideal treatment candidate as he is badly deconditioned with numerous comorbidities including COPD.  He is currently immobile as a result of his compression fracture of T11.  The patient is unsure what systemic treatments he would want to pursue, but he would like to know what options are currently available.  As such I recommend that we proceed with genetic testing of this tumor as well as PD-L1 status to determine if there is a targetable mutation.  In the event there is no targetable mutation or lobe TPS score would need to consider systemic chemotherapy, which I would hesitate to start in this patient.  Overall for supportive care I am in agreement with the palliative care consult as well as IR consideration of a kyphoplasty.  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 Will consider ordering Guardant 360 in the outpatient setting if tissue is inadequate. --no hypercalcemia of malignancy, no urgent need to start zometa. Patient is edentulous, but would recommend dental eval prior to start --agree with consideration of kyphoplasty while patient is in house. This is currently under consideration by IR --appreciate the involvement of palliative care with Dr. Domingo Cocking.   All questions were answered. The patient knows to call the clinic with any problems, questions or concerns.  A total of more than  55 minutes were spent on this encounter and over half of that time was spent on counseling and coordination of care as outlined above.   Ledell Peoples, MD Department of Hematology/Oncology [place] [facility] Phone: [phone] Pager: [phone] Email: [email or web address]  02/18/2020 7:33 PM

## 2020-02-19 ENCOUNTER — Inpatient Hospital Stay (HOSPITAL_COMMUNITY): Payer: Medicare Other

## 2020-02-19 DIAGNOSIS — C7951 Secondary malignant neoplasm of bone: Secondary | ICD-10-CM

## 2020-02-19 LAB — IRON AND TIBC
Iron: 54 ug/dL (ref 45–182)
Saturation Ratios: 22 % (ref 17.9–39.5)
TIBC: 250 ug/dL (ref 250–450)
UIBC: 196 ug/dL

## 2020-02-19 LAB — FOLATE: Folate: 5.3 ng/mL — ABNORMAL LOW (ref >5.9)

## 2020-02-19 LAB — RETICULOCYTES
Immature Retic Fract: 9.2 % (ref 2.3–15.9)
RBC.: 3.93 MIL/uL — ABNORMAL LOW (ref 4.22–5.81)
Retic Count, Absolute: 57.8 10*3/uL (ref 19.0–186.0)
Retic Ct Pct: 1.5 % (ref 0.4–3.1)

## 2020-02-19 LAB — VITAMIN B12: Vitamin B-12: 1218 pg/mL — ABNORMAL HIGH (ref 180–914)

## 2020-02-19 LAB — FERRITIN: Ferritin: 1866 ng/mL — ABNORMAL HIGH (ref 24–336)

## 2020-02-19 LAB — SURGICAL PATHOLOGY

## 2020-02-19 IMAGING — MR MR HEAD WO/W CM
16 series · 48 of 48 positions shown · IV contrast (gadavist)
Comparison: Head CT [DATE].

CLINICAL DATA: Non-small cell lung cancer, staging.

EXAM:
MRI HEAD WITHOUT AND WITH CONTRAST
TECHNIQUE: Multiplanar, multiecho pulse sequences of the brain and surrounding
structures were obtained without and with intravenous contrast.
CONTRAST:  8mL GADAVIST GADOBUTROL 1 MMOL/ML IV SOLN

[Series 5: DWI · axial · 3.0mm · 1.36mm/px · z∈[-21,+119]mm · 5 of 107 slices shown (1 of 4)]
[im 1/107]
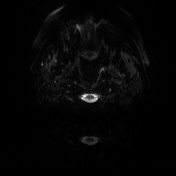
[im 27/107]
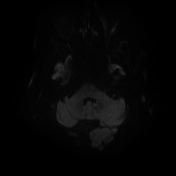
[im 54/107]
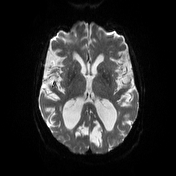
[im 80/107]
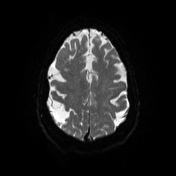
[im 107/107]
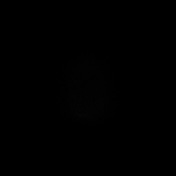

[Series 6: DWI · axial · 3.0mm · 1.36mm/px · z∈[-21,+119]mm · 2 of 54 slices shown (2 of 4)]
[im 1/54]
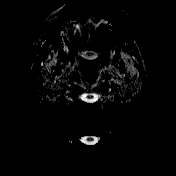
[im 54/54]
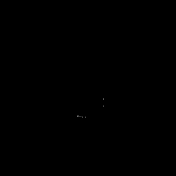

[Series 7: T1 · sagittal · 5.0mm · 0.75mm/px · 1 of 26 slices shown (1 of 2)]
[im 1/26]
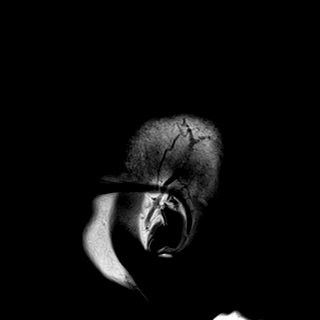

[Series 8: T2 · axial · 5.0mm · 0.62mm/px · 1 of 26 slices shown]
[im 1/26]
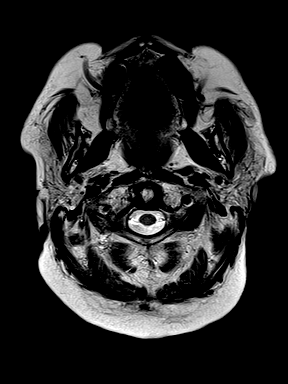

[Series 9: mip_images(sw) · axial · 32.0mm · 0.75mm/px · 1 of 33 slices shown]
[im 1/33]
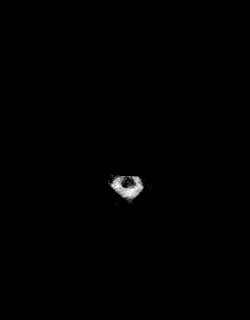

[Series 10: swi_images · axial · 4.0mm · 0.75mm/px · z∈[-14,+122]mm · 2 of 40 slices shown]
[im 1/40]
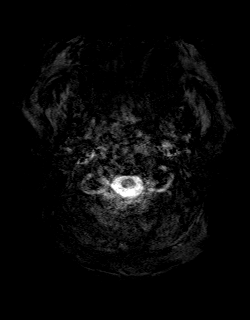
[im 40/40]
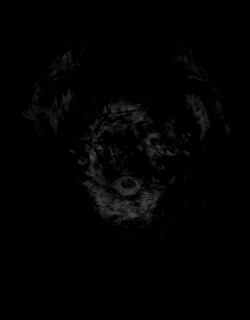

[Series 11: FLAIR · axial · 3.0mm · 0.75mm/px · z∈[-13,+121]mm · 2 of 52 slices shown]
[im 1/52]
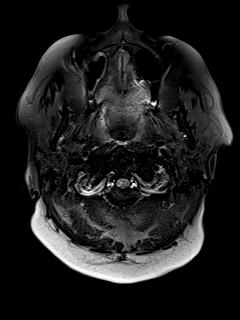
[im 52/52]
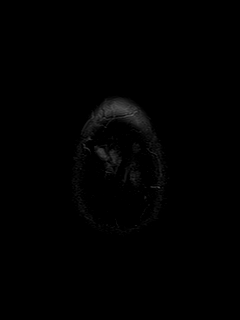

[Series 12: T1 · axial · 1.0mm · 0.94mm/px · z∈[-13,+126]mm · 7 of 160 slices shown (2 of 2)]
[im 1/160]
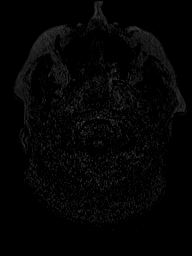
[im 27/160]
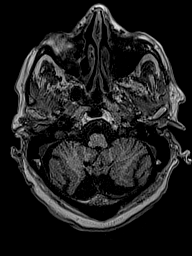
[im 54/160]
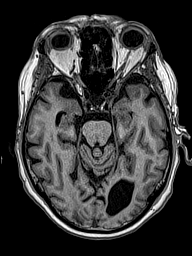
[im 80/160]
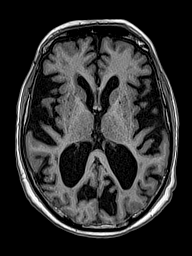
[im 107/160]
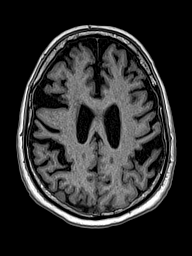
[im 133/160]
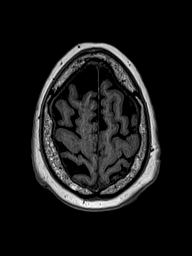
[im 160/160]
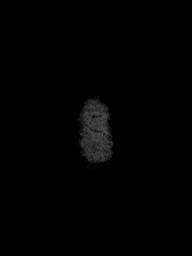

[Series 13: DWI · coronal · 5.0mm · 1.31mm/px · 3 of 80 slices shown (3 of 4)]
[im 1/80]
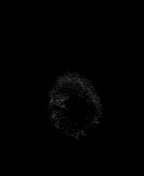
[im 40/80]
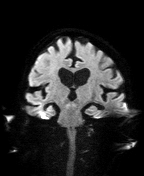
[im 80/80]
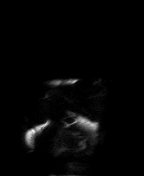

[Series 14: DWI · coronal · 5.0mm · 1.31mm/px · 2 of 40 slices shown (4 of 4)]
[im 1/40]
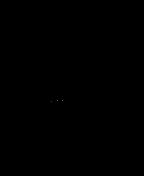
[im 40/40]
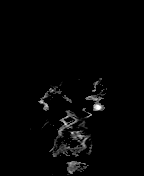

[Series 15: T2 post-contrast · coronal · 5.0mm · 0.57mm/px · 1 of 30 slices shown]
[im 1/30]
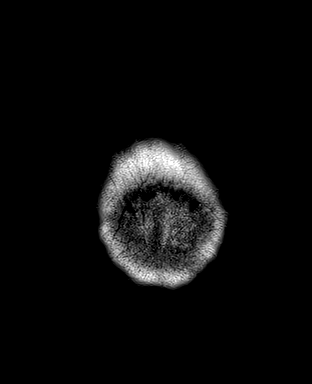

[Series 16: T1 post-contrast · axial · 1.0mm · 0.94mm/px · z∈[-13,+126]mm · 7 of 160 slices shown (1 of 3)]
[im 1/160]
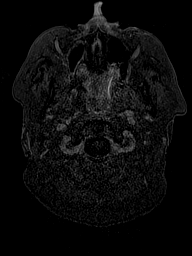
[im 27/160]
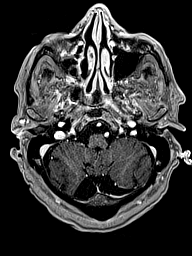
[im 54/160]
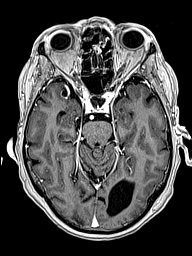
[im 80/160]
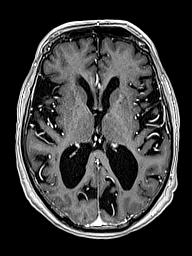
[im 107/160]
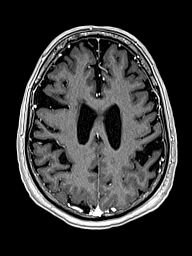
[im 133/160]
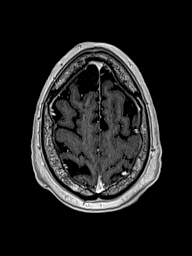
[im 160/160]
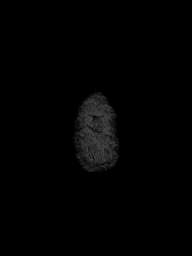

[Series 17: T1 post-contrast · coronal · 5.0mm · 0.43mm/px · 1 of 30 slices shown (2 of 3)]
[im 1/30]
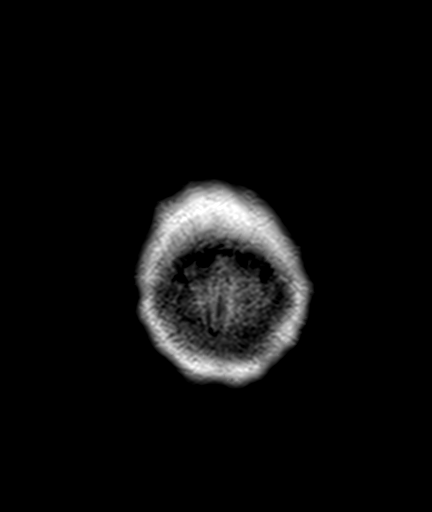

[Series 18: T1 post-contrast · sagittal · 5.0mm · 0.75mm/px · 1 of 26 slices shown (3 of 3)]
[im 1/26]
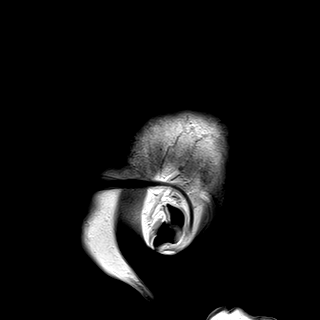

[Series 100: <mpr range> · coronal · 1.0mm · 0.94mm/px · 6 of 150 slices shown]
[im 1/150]
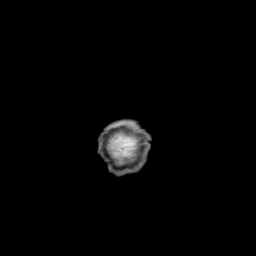
[im 30/150]
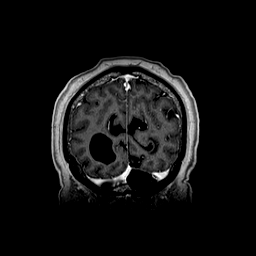
[im 60/150]
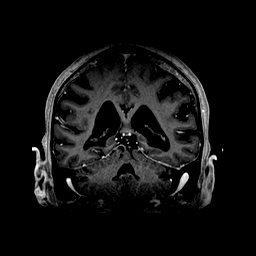
[im 90/150]
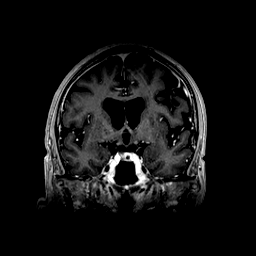
[im 120/150]
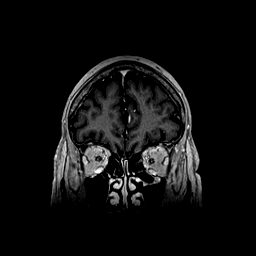
[im 150/150]
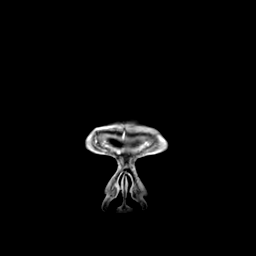

[Series 101: <mpr range(1)> · sagittal · 1.0mm · 0.94mm/px · 6 of 150 slices shown]
[im 1/150]
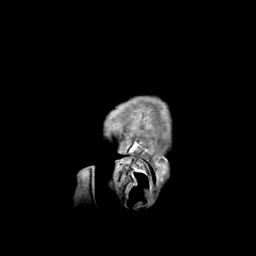
[im 30/150]
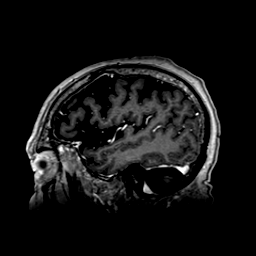
[im 60/150]
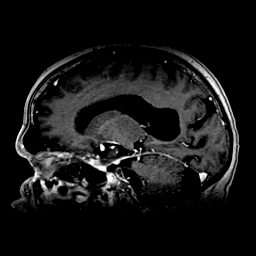
[im 90/150]
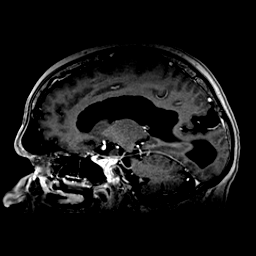
[im 120/150]
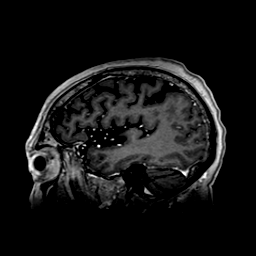
[im 150/150]
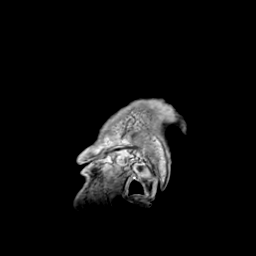

[48 of 48 positions shown; findings below may reference images not displayed]

FINDINGS: Brain:

Stable, moderate generalized parenchymal atrophy.

No abnormal enhancement is demonstrated to suggest intracranial
metastatic disease.

There is a small chronic cortically based infarct within the right
occipital lobe.

Mild scattered T2/FLAIR hyperintensity within the cerebral white
matter and pons is nonspecific, but consistent with chronic small
vessel ischemic disease.

There is no acute infarct.

No evidence of intracranial mass.

No chronic intracranial blood products.

No extra-axial fluid collection.

No midline shift.

Vascular: Expected proximal arterial flow voids.

Skull and upper cervical spine: No focal marrow lesion.

Sinuses/Orbits: Visualized orbits show no acute finding. Tiny right
maxillary sinus mucous retention cysts. No significant mastoid
effusion.
IMPRESSION: No evidence of intracranial metastatic disease.

Small chronic cortically based right occipital lobe infarct.

Moderate generalized parenchymal atrophy with mild chronic small
vessel ischemic disease.

Tiny right maxillary sinus mucous retention cysts.

## 2020-02-19 MED ORDER — CEFAZOLIN SODIUM-DEXTROSE 2-4 GM/100ML-% IV SOLN
2.0000 g | INTRAVENOUS | Status: AC
  Administered 2020-02-20: 2 g via INTRAVENOUS
  Filled 2020-02-19: qty 100

## 2020-02-19 MED ORDER — GADOBUTROL 1 MMOL/ML IV SOLN
8.0000 mL | Freq: Once | INTRAVENOUS | Status: AC | PRN
  Administered 2020-02-19: 8 mL via INTRAVENOUS

## 2020-02-19 NOTE — H&P (Signed)
Chief Complaint: Patient was seen in consultation today for  Chief Complaint  Patient presents with  . Weakness  . Shortness of Breath    Referring Physician(s): Dr. Wynelle Cleveland  Supervising Physician: Jacqulynn Cadet  Patient Status: [facility] [place] - In-pt  History of Present Illness: George Frazier is a 74 y.o. 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 He presented to the the ED with worsening back pain, weakness and poor appetite. MRI of the lumbar spine revealed extensive tumor burden. The patient underwent CT-guided core biopsy of a T5 paraspinal mass (02/16/20 in IR) with results consistent with adenocarcinoma. The patient also was identified as having a T11 compression fracture with a tumor.   MRI thoracic spine 02/12/20: IMPRESSION: 1. Abnormal marrow signal intensity throughout the thoracic spine, consistent with metastatic disease or myeloma. 2. Complete involvement of the T11 vertebral body with associated pathologic compression fracture, with 5 mm of retropulsed bone and/or tumor protruding into the spinal canal. Resultant mild spinal stenosis without frank cord compression or cord signal changes. 3. Additional prominent tumor involvement involving the right posterior elements of T5 with associated adjacent paraspinous edema. 4. No other significant epidural or extraosseous tumor identified. No significant disc pathology or stenosis.  Interventional Radiology has been asked to evaluate this patient for a T11 osteocool thermal ablation and kyphoplasty/vertebroplasty. This case has been reviewed and procedure approved by Dr. Laurence Ferrari.   Past Medical History:  Diagnosis Date  . CHF (congestive heart failure) ([place])   . COPD (chronic obstructive pulmonary disease) ([place])   . Hypertension     History reviewed. No pertinent surgical history.  Allergies: Patient has no known  allergies.  Medications: Prior to Admission medications   Medication Sig Start Date End Date Taking? Authorizing Provider  albuterol (PROVENTIL HFA;VENTOLIN HFA) 108 (90 Base) MCG/ACT inhaler Inhale 2 puffs into the lungs every 4 (four) hours as needed for wheezing or shortness of breath. 09/21/17  Yes Shahmehdi, Seyed A, MD  albuterol (PROVENTIL) (2.5 MG/3ML) 0.083% nebulizer solution Take 3 mLs (2.5 mg total) by nebulization 3 (three) times daily. Patient taking differently: Take 2.5 mg by nebulization every 6 (six) hours as needed for wheezing.  09/05/18  Yes Mannam, Praveen, MD  atorvastatin (LIPITOR) 80 MG tablet Take 1 tablet (80 mg total) by mouth daily at 6 PM. 12/16/19  Yes Abonza, Maritza, PA-C  furosemide (LASIX) 20 MG tablet Take 0.5 tablets (10 mg total) by mouth daily. 12/16/19  Yes Abonza, Maritza, PA-C  metoprolol succinate (TOPROL-XL) 50 MG 24 hr tablet Take 1 tablet (50 mg total) by mouth daily. 12/16/19  Yes Abonza, Maritza, PA-C  Olmesartan-amLODIPine-HCTZ 20-5-12.5 MG TABS TAKE 1 TABLET BY MOUTH DAILY. 12/16/19  Yes Abonza, Maritza, PA-C  TRELEGY ELLIPTA 100-62.5-25 MCG/INH AEPB INHALE 1 PUFF INTO THE LUNGS DAILY. NEED APPOINTMENT FOR FURTHER REFILLS Patient taking differently: Inhale 1 puff into the lungs daily.  12/09/19  Yes Marshell Garfinkel, MD     Family History  Problem Relation Age of Onset  . CAD Mother 38  . Hypertension Mother   . Stroke Mother   . COPD Father 70  . AAA (abdominal aortic aneurysm) Father   . COPD Brother   . COPD Brother   . Cancer Neg Hx     Social History   Socioeconomic History  . Marital status: Single    Spouse name: Not on file  . Number of children: Not on file  .  Years of education: Not on file  . Highest education level: Not on file  Occupational History  . Occupation: retired  Tobacco Use  . Smoking status: Former Smoker    Packs/day: 1.00    Years: 40.00    Pack years: 40.00    Quit date: 2008    Years since quitting: 13.6  .  Smokeless tobacco: Former Network engineer  . Vaping Use: Never used  Substance and Sexual Activity  . Alcohol use: No  . Drug use: No  . Sexual activity: Never  Other Topics Concern  . Not on file  Social History Narrative  . Not on file   Social Determinants of Health   Financial Resource Strain:   . Difficulty of Paying Living Expenses:   Food Insecurity:   . Worried About Charity fundraiser in the Last Year:   . Arboriculturist in the Last Year:   Transportation Needs:   . Film/video editor (Medical):   Marland Kitchen Lack of Transportation (Non-Medical):   Physical Activity:   . Days of Exercise per Week:   . Minutes of Exercise per Session:   Stress:   . Feeling of Stress :   Social Connections:   . Frequency of Communication with Friends and Family:   . Frequency of Social Gatherings with Friends and Family:   . Attends Religious Services:   . Active Member of Clubs or Organizations:   . Attends Archivist Meetings:   Marland Kitchen Marital Status:     Review of Systems: A 12 point ROS discussed and pertinent positives are indicated in the HPI above.  All other systems are negative.  Review of Systems  Constitutional: Positive for appetite change and fatigue.  Respiratory: Positive for shortness of breath.   Gastrointestinal: Positive for nausea. Negative for abdominal pain, diarrhea and vomiting.  Musculoskeletal: Positive for back pain.  Neurological: Negative for headaches.    Vital Signs: BP 139/87 (BP Location: Left Arm)   Pulse 92   Temp 98.7 F (37.1 C) (Oral)   Resp 17   Ht 6' (1.829 m)   Wt 173 lb (78.5 kg)   SpO2 100%   BMI 23.46 kg/m   Physical Exam Constitutional:      General: He is not in acute distress.    Appearance: He is ill-appearing.  HENT:     Mouth/Throat:     Mouth: Mucous membranes are moist.     Pharynx: Oropharynx is clear.  Cardiovascular:     Rate and Rhythm: Normal rate and regular rhythm.  Pulmonary:     Effort: Pulmonary  effort is normal.     Comments: Decreased breath sounds Abdominal:     General: Bowel sounds are normal.     Palpations: Abdomen is soft.  Skin:    General: Skin is warm and dry.  Neurological:     Mental Status: He is alert and oriented to person, place, and time.     Imaging: CT ABDOMEN PELVIS WO CONTRAST  Result Date: 02/13/2020 CLINICAL DATA:  Metastatic disease of unknown primary. EXAM: CT CHEST, ABDOMEN AND PELVIS WITHOUT CONTRAST TECHNIQUE: Multidetector CT imaging of the chest, abdomen and pelvis was performed following the standard protocol without IV contrast. COMPARISON:  CT chest 02/19/2019. FINDINGS: CT CHEST FINDINGS Cardiovascular: The heart size is normal. No substantial pericardial effusion. Coronary artery calcification is evident. Atherosclerotic calcification is noted in the wall of the thoracic aorta. Mediastinum/Nodes: No mediastinal lymphadenopathy. No evidence for  gross hilar lymphadenopathy although assessment is limited by the lack of intravenous contrast on today's study. The esophagus has normal imaging features. There is no axillary lymphadenopathy. Lungs/Pleura: Centrilobular and paraseptal emphysema evident. Bullous change noted right lung apex with biapical pleuroparenchymal scarring. 10 mm spiculated right upper lobe pulmonary nodule identified on image 43/series 4. 5 mm posterior right upper lobe nodule evident on 58/4. Scattered areas of architectural distortion and parenchymal scarring noted. Circumferential bronchial wall thickening evident bilaterally with substantial peripheral airway impaction in the posterior right lower lobe. Atelectasis noted posterior costophrenic sulci. Musculoskeletal: Lytic lesions are identified in the manubrium measuring up to 13 mm (sagittal image 96/series 6). T11 compression fracture evident. 4.4 x 3.4 cm soft tissue mass destroys the right T5 pedicle and transverse process with involvement of the posterior right fifth rib. CT  ABDOMEN PELVIS FINDINGS Hepatobiliary: Not well demonstrated on this noncontrast study, suspicion for a 1.8 cm lesion in the dome of the lateral segment left liver (narrow windows on axial 56/2). Small area of low attenuation in the anterior liver, adjacent to the falciform ligament, is in a characteristic location for focal fatty deposition. There is no evidence for gallstones, gallbladder wall thickening, or pericholecystic fluid. No intrahepatic or extrahepatic biliary dilation. Pancreas: No focal mass lesion. No dilatation of the main duct. No intraparenchymal cyst. No peripancreatic edema. Spleen: No splenomegaly. No focal mass lesion. Adrenals/Urinary Tract: No adrenal nodule or mass. Left kidney has unremarkable noncontrast appearance. Cystic lesion in the upper pole right kidney likely focally dilated upper pole calyx although central sinus cysts could have this appearance. No evidence for hydroureter. The urinary bladder appears normal for the degree of distention. Stomach/Bowel: Stomach is unremarkable. No gastric wall thickening. No evidence of outlet obstruction. Duodenum is normally positioned as is the ligament of Treitz. No small bowel wall thickening. No small bowel dilatation. The terminal ileum is normal. The appendix is normal. No gross colonic mass. No colonic wall thickening. Diverticular changes are noted in the left colon without evidence of diverticulitis. Vascular/Lymphatic: Saccular aneurysm identified infrarenal abdominal aorta measuring up to 3.2 cm in maximum diameter. There is no gastrohepatic or hepatoduodenal ligament lymphadenopathy. No retroperitoneal or mesenteric lymphadenopathy. No pelvic sidewall lymphadenopathy. Reproductive: Prostate gland is enlarged. Other: No intraperitoneal free fluid. Musculoskeletal: Right groin hernia contains distal ileal loops without obstruction or complicating features. Destructive tumor identified at S1, better characterized on recent MRI.  Additional bony metastatic disease also better assessed at MRI. IMPRESSION: 1. 10 mm spiculated right upper lobe pulmonary nodule. Primary bronchogenic neoplasm versus metastatic deposit. 2. 5 mm posterior right upper lobe pulmonary nodule. Metastatic disease not excluded. Close attention on follow-up recommended. 3. Circumferential bronchial wall thickening with substantial peripheral airway impaction in the posterior right lower lobe. Imaging features may be related to atypical infection although aspiration could have this appearance in the appropriate clinical setting. 4. 1.8 cm suspected lesion in the dome of the lateral segment left liver, not well evaluated on this noncontrast exam metastatic disease a concern. 5. Metastatic disease identified in the manubrium and bulky soft tissue metastatic deposit involves the right T5 pedicle, transverse process and posterior right fifth rib. 6. 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 7. Right groin hernia contains distal ileal loops without obstruction or complicating features. 8. Saccular infrarenal abdominal aortic aneurysm measuring up to 3.2 cm in maximum diameter. Attention on follow-up recommended. 9. Prostatomegaly. 10. Aortic Atherosclerosis (ICD10-I70.0) and Emphysema (ICD10-J43.9). Electronically Signed   By:  Misty Stanley M.D.   On: 02/13/2020 08:11   DG Chest 2 View  Result Date: 02/11/2020 CLINICAL DATA:  Shortness of breath. EXAM: CHEST - 2 VIEW COMPARISON:  August 08, 2018. FINDINGS: The heart size and mediastinal contours are within normal limits. Both lungs are clear. No pneumothorax or pleural effusion is noted. The visualized skeletal structures are unremarkable. IMPRESSION: No active cardiopulmonary disease. Aortic Atherosclerosis (ICD10-I70.0). Electronically Signed   By: Marijo Conception M.D.   On: 02/11/2020 12:24   CT CHEST WO CONTRAST  Result Date: 02/13/2020 CLINICAL DATA:  Metastatic disease of  unknown primary. EXAM: CT CHEST, ABDOMEN AND PELVIS WITHOUT CONTRAST TECHNIQUE: Multidetector CT imaging of the chest, abdomen and pelvis was performed following the standard protocol without IV contrast. COMPARISON:  CT chest 02/19/2019. FINDINGS: CT CHEST FINDINGS Cardiovascular: The heart size is normal. No substantial pericardial effusion. Coronary artery calcification is evident. Atherosclerotic calcification is noted in the wall of the thoracic aorta. Mediastinum/Nodes: No mediastinal lymphadenopathy. No evidence for gross hilar lymphadenopathy although assessment is limited by the lack of intravenous contrast on today's study. The esophagus has normal imaging features. There is no axillary lymphadenopathy. Lungs/Pleura: Centrilobular and paraseptal emphysema evident. Bullous change noted right lung apex with biapical pleuroparenchymal scarring. 10 mm spiculated right upper lobe pulmonary nodule identified on image 43/series 4. 5 mm posterior right upper lobe nodule evident on 58/4. Scattered areas of architectural distortion and parenchymal scarring noted. Circumferential bronchial wall thickening evident bilaterally with substantial peripheral airway impaction in the posterior right lower lobe. Atelectasis noted posterior costophrenic sulci. Musculoskeletal: Lytic lesions are identified in the manubrium measuring up to 13 mm (sagittal image 96/series 6). T11 compression fracture evident. 4.4 x 3.4 cm soft tissue mass destroys the right T5 pedicle and transverse process with involvement of the posterior right fifth rib. CT ABDOMEN PELVIS FINDINGS Hepatobiliary: Not well demonstrated on this noncontrast study, suspicion for a 1.8 cm lesion in the dome of the lateral segment left liver (narrow windows on axial 56/2). Small area of low attenuation in the anterior liver, adjacent to the falciform ligament, is in a characteristic location for focal fatty deposition. There is no evidence for gallstones, gallbladder  wall thickening, or pericholecystic fluid. No intrahepatic or extrahepatic biliary dilation. Pancreas: No focal mass lesion. No dilatation of the main duct. No intraparenchymal cyst. No peripancreatic edema. Spleen: No splenomegaly. No focal mass lesion. Adrenals/Urinary Tract: No adrenal nodule or mass. Left kidney has unremarkable noncontrast appearance. Cystic lesion in the upper pole right kidney likely focally dilated upper pole calyx although central sinus cysts could have this appearance. No evidence for hydroureter. The urinary bladder appears normal for the degree of distention. Stomach/Bowel: Stomach is unremarkable. No gastric wall thickening. No evidence of outlet obstruction. Duodenum is normally positioned as is the ligament of Treitz. No small bowel wall thickening. No small bowel dilatation. The terminal ileum is normal. The appendix is normal. No gross colonic mass. No colonic wall thickening. Diverticular changes are noted in the left colon without evidence of diverticulitis. Vascular/Lymphatic: Saccular aneurysm identified infrarenal abdominal aorta measuring up to 3.2 cm in maximum diameter. There is no gastrohepatic or hepatoduodenal ligament lymphadenopathy. No retroperitoneal or mesenteric lymphadenopathy. No pelvic sidewall lymphadenopathy. Reproductive: Prostate gland is enlarged. Other: No intraperitoneal free fluid. Musculoskeletal: Right groin hernia contains distal ileal loops without obstruction or complicating features. Destructive tumor identified at S1, better characterized on recent MRI. Additional bony metastatic disease also better assessed at MRI. IMPRESSION: 1. 10  mm spiculated right upper lobe pulmonary nodule. Primary bronchogenic neoplasm versus metastatic deposit. 2. 5 mm posterior right upper lobe pulmonary nodule. Metastatic disease not excluded. Close attention on follow-up recommended. 3. Circumferential bronchial wall thickening with substantial peripheral airway  impaction in the posterior right lower lobe. Imaging features may be related to atypical infection although aspiration could have this appearance in the appropriate clinical setting. 4. 1.8 cm suspected lesion in the dome of the lateral segment left liver, not well evaluated on this noncontrast exam metastatic disease a concern. 5. Metastatic disease identified in the manubrium and bulky soft tissue metastatic deposit involves the right T5 pedicle, transverse process and posterior right fifth rib. 6. 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 7. Right groin hernia contains distal ileal loops without obstruction or complicating features. 8. Saccular infrarenal abdominal aortic aneurysm measuring up to 3.2 cm in maximum diameter. Attention on follow-up recommended. 9. Prostatomegaly. 10. Aortic Atherosclerosis (ICD10-I70.0) and Emphysema (ICD10-J43.9). Electronically Signed   By: Misty Stanley M.D.   On: 02/13/2020 08:11   MR BRAIN W WO CONTRAST  Result Date: 02/19/2020 CLINICAL DATA:  Non-small cell lung cancer, staging. EXAM: MRI HEAD WITHOUT AND WITH CONTRAST TECHNIQUE: Multiplanar, multiecho pulse sequences of the brain and surrounding structures were obtained without and with intravenous contrast. CONTRAST:  57mL GADAVIST GADOBUTROL 1 MMOL/ML IV SOLN COMPARISON:  Head CT 09/17/2017. FINDINGS: Brain: Stable, moderate generalized parenchymal atrophy. No abnormal enhancement is demonstrated to suggest intracranial metastatic disease. There is a small chronic cortically based infarct within the right occipital lobe. Mild scattered T2/FLAIR hyperintensity within the cerebral white matter and pons is nonspecific, but consistent with chronic small vessel ischemic disease. There is no acute infarct. No evidence of intracranial mass. No chronic intracranial blood products. No extra-axial fluid collection. No midline shift. Vascular: Expected proximal arterial flow voids. Skull and  upper cervical spine: No focal marrow lesion. Sinuses/Orbits: Visualized orbits show no acute finding. Tiny right maxillary sinus mucous retention cysts. No significant mastoid effusion. IMPRESSION: No evidence of intracranial metastatic disease. 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 Moderate generalized parenchymal atrophy with mild chronic small vessel ischemic disease. Tiny right maxillary sinus mucous retention cysts. Electronically Signed   By: Kellie Simmering DO   On: 02/19/2020 11:48   MR THORACIC SPINE WO CONTRAST  Result Date: 02/12/2020 CLINICAL DATA:  Initial evaluation for mid back pain. EXAM: MRI THORACIC SPINE WITHOUT CONTRAST TECHNIQUE: Multiplanar, multisequence MR imaging of the thoracic spine was performed. No intravenous contrast was administered. COMPARISON:  Prior MRI of the lumbar spine from 02/11/2020. FINDINGS: Alignment: Exaggeration of the normal thoracic kyphosis with underlying mild dextroscoliosis. No listhesis. Vertebrae: Abnormal marrow signal intensity consistent with metastatic disease or possibly myeloma seen throughout the thoracic spine, with involvement of nearly all levels. Involvement most pronounced within the mid and lower thoracic spine extending from T7 inferiorly. Complete involvement of the T11 vertebral body with associated pathologic compression fracture, with height loss measuring up to 75%. 5 mm of retropulsed bone and/or tumor protrudes into the spinal canal. Resultant mild spinal stenosis with minimal flattening of the ventral spinal cord, but no frank cord compression or cord signal changes. Probable additional compression fracture noted extending through the superior endplate of L1, partially visualized, better evaluated on previous lumbar MRI. No other pathologic fracture identified. Prominent tumor involvement of the right posterior elements noted at T5 (series 25, image 8). No other significant extra osseous or epidural tumor identified.  Probable scattered rib involvement noted as well. Cord: Signal intensity within the thoracic spinal cord is within normal limits. Paraspinal and other soft tissues: Soft tissue edema noted within the right upper posterior back at the level of T4-5 related to prominent tumor burden involving the adjacent right T5 posterior elements (series 21, image 5). Paraspinous soft tissues demonstrate no other acute finding. Trace layering bilateral pleural effusions noted. Disc levels: No significant disc pathology seen within the thoracic spine. No other significant canal or foraminal stenosis at this time. IMPRESSION: 1. Abnormal marrow signal intensity throughout the thoracic spine, consistent with metastatic disease or myeloma. 2. Complete involvement of the T11 vertebral body with associated pathologic compression fracture, with 5 mm of retropulsed bone and/or tumor protruding into the spinal canal. Resultant mild spinal stenosis without frank cord compression or cord signal changes. 3. Additional prominent tumor involvement involving the right posterior elements of T5 with associated adjacent paraspinous edema. 4. No other significant epidural or extraosseous tumor identified. No significant disc pathology or stenosis. Electronically Signed   By: Jeannine Boga M.D.   On: 02/12/2020 22:52   MR LUMBAR SPINE WO CONTRAST  Result Date: 02/11/2020 CLINICAL DATA:  Low back pain with difficulty walking, persistently worsening. EXAM: MRI LUMBAR SPINE WITHOUT CONTRAST TECHNIQUE: Multiplanar, multisequence MR imaging of the lumbar spine was performed. No intravenous contrast was administered. COMPARISON:  None. FINDINGS: Segmentation:  5 lumbar type vertebral bodies. Alignment:  Normal Vertebrae: Metastatic disease or myeloma throughout the region with involvement each vertebral level from inferior T11 through S1. Extensive confluent disease replacing much of the marrow. Minor superior endplate deformity at L1 which could  be at a fracture related to the underlying tumor or could be chronic. Extraosseous tumor extension at the S1 level involving the ventral epidural space. Lesion also seen within the right iliac bone. At the barely visible inferior T11 level, it appears that tumor bulges into the anterior aspect of the spinal canal. This was not studied completely. There is some potential that this could affect the distal thoracic cord. Conus medullaris and cauda equina: Conus extends to the T12-L1 level. Conus and cauda equina appear normal. Paraspinal and other soft tissues: Negative. No visible retroperitoneal adenopathy. Question focal aneurysm of the infrarenal abdominal aorta measuring up to 3.5 cm. This was not fully evaluated. Disc levels: No disc pathology at L2-3 or above. L3-4: Mild bulging of the disc.  No compressive stenosis. L5-S1: Chronic fusion across this disc space. Sufficient patency of the canal and foramina. As noted above, there is ventral epidural bulging of tumor at the S1 level but this does not cause visible neural compression. L4-5: Disc bulge. Mild facet and ligamentous hypertrophy. Mild bilateral lateral recess stenosis. 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 Bulging of tumor into the ventral spinal canal at the inferior T11 level, barely visible on this study. Complete spinal imaging recommended. Bulging of tumor at the S1 level, encroaching upon the ventral epidural space but without gross neural compression. Electronically Signed   By: Nelson Chimes M.D.   On: 02/11/2020 19:36   NM Pulmonary Perfusion  Result Date: 02/11/2020 CLINICAL DATA:  PE suspected, high prob Shortness of breath.  Weakness. EXAM: NUCLEAR MEDICINE PERFUSION LUNG SCAN TECHNIQUE: Perfusion images were obtained in multiple projections after intravenous injection of radiopharmaceutical. Ventilation scans intentionally deferred if perfusion scan and  chest x-ray adequate for interpretation during COVID 19 epidemic. RADIOPHARMACEUTICALS:  3.8 mCi  Tc-68m MAA IV COMPARISON:  Chest radiograph earlier this day. FINDINGS: Lung perfusion is diffusely heterogeneous. There are no discrete wedge-shaped perfusion defects. IMPRESSION: Diffusely heterogeneous lung perfusion without discrete wedge-shaped perfusion defect. No scintigraphic evidence of pulmonary embolus. Electronically Signed   By: Keith Rake M.D.   On: 02/11/2020 16:50   US RENAL  Result Date: 02/11/2020 CLINICAL DATA:  Acute kidney injury EXAM: RENAL / URINARY TRACT ULTRASOUND COMPLETE COMPARISON:  Ultrasound 09/18/2017 FINDINGS: Right Kidney: Renal measurements: 9.1 by 5 x 5.2 cm = volume: 125 mL. Cortical echogenicity within normal limits. No hydronephrosis. 4.2 x 2.9 x 4.2 cm parapelvic cyst, previously 4.3 cm. Left Kidney: Renal measurements: 8.8 x 4.6 x 4.7 cm = volume: 100 mL. Echogenicity within normal limits. No mass or hydronephrosis visualized. Bladder: Appears normal for degree of bladder distention. Other: Bladder volume of 473 mL. Patient unable to void to get postvoid residual. IMPRESSION: 1. Negative for hydronephrosis. 2. Similar 4.3 cm right parapelvic cyst Electronically Signed   By: Donavan Foil M.D.   On: 02/11/2020 16:30   CT BIOPSY  Result Date: 02/16/2020 INDICATION: 74 year old male with multifocal lytic osseous lesions concerning for metastatic disease. Primary malignancy is uncertain at this time. EXAM: CT-guided bone biopsy MEDICATIONS: None. ANESTHESIA/SEDATION: Moderate (conscious) sedation was employed during this procedure. A total of Versed 2 mg and Fentanyl 100 mcg was administered intravenously. Moderate Sedation Time: 13 minutes. The patient's level of consciousness and vital signs were monitored continuously by radiology nursing throughout the procedure under my direct supervision. FLUOROSCOPY TIME:  None COMPLICATIONS: None immediate. PROCEDURE: Informed  written consent was obtained from the patient after a thorough discussion of the procedural risks, benefits and alternatives. All questions were addressed. Maximal Sterile Barrier Technique was utilized including caps, mask, sterile gowns, sterile gloves, sterile drape, hand hygiene and skin antiseptic. A timeout was performed prior to the initiation of the procedure. The patient was placed in the prone position. A planning axial CT imaging was obtained. The soft tissue mass emanating from the posterior aspect of the right fifth rib and adjacent T5 vertebral body was successfully identified. A suitable skin entry site was selected and marked. Sterile prep and drape was then performed using chlorhexidine skin prep. Local anesthesia was attained by infiltration with 1% lidocaine. A small dermatotomy was made. Under intermittent CT guidance, a 17 gauge introducer needle was advanced into the margin of the mass. Multiple 18 gauge core biopsies were then obtained using the bio Pince automated biopsy device. Biopsy specimens were placed in formalin and delivered to pathology for further analysis. The biopsy device was removed. Hemostasis was attained by gentle manual pressure. The patient tolerated the procedure well. IMPRESSION: Technically successful CT-guided core biopsy of T5 paraspinal mass. Electronically Signed   By: Jacqulynn Cadet M.D.   On: 02/16/2020 13:53   VAS [place] LOWER EXTREMITY VENOUS (DVT)  Result Date: 02/14/2020  Lower Venous DVTStudy Indications: Elevated ddimer.  Comparison Study: no prior Performing Technologist: Abram Sander RVS  Examination Guidelines: A complete evaluation includes B-mode imaging, spectral Doppler, color Doppler, and power Doppler as needed of all accessible portions of each vessel. Bilateral testing is considered an integral part of a complete examination. Limited examinations for reoccurring indications may be performed as noted. The reflux portion of the exam is performed  with the patient in reverse Trendelenburg.  +---------+---------------+---------+-----------+----------+--------------+ RIGHT    CompressibilityPhasicitySpontaneityPropertiesThrombus Aging +---------+---------------+---------+-----------+----------+--------------+ CFV      Full  Yes      Yes                                 +---------+---------------+---------+-----------+----------+--------------+ SFJ      Full                                                        +---------+---------------+---------+-----------+----------+--------------+ FV Prox  Full                                                        +---------+---------------+---------+-----------+----------+--------------+ FV Mid   Full                                                        +---------+---------------+---------+-----------+----------+--------------+ FV DistalFull                                                        +---------+---------------+---------+-----------+----------+--------------+ PFV      Full                                                        +---------+---------------+---------+-----------+----------+--------------+ POP      Full           Yes      Yes                                 +---------+---------------+---------+-----------+----------+--------------+ PTV      Full                                                        +---------+---------------+---------+-----------+----------+--------------+ PERO     Full                                                        +---------+---------------+---------+-----------+----------+--------------+   +---------+---------------+---------+-----------+----------+--------------+ LEFT     CompressibilityPhasicitySpontaneityPropertiesThrombus Aging +---------+---------------+---------+-----------+----------+--------------+ CFV      Full           Yes      Yes                                  +---------+---------------+---------+-----------+----------+--------------+ SFJ  Full                                                        +---------+---------------+---------+-----------+----------+--------------+ FV Prox  Full                                                        +---------+---------------+---------+-----------+----------+--------------+ FV Mid   Full                                                        +---------+---------------+---------+-----------+----------+--------------+ FV DistalFull                                                        +---------+---------------+---------+-----------+----------+--------------+ PFV      Full                                                        +---------+---------------+---------+-----------+----------+--------------+ POP      Full           Yes      Yes                                 +---------+---------------+---------+-----------+----------+--------------+ PTV      Full                                                        +---------+---------------+---------+-----------+----------+--------------+ PERO     Full                                                        +---------+---------------+---------+-----------+----------+--------------+     Summary: BILATERAL: - No evidence of deep vein thrombosis seen in the lower extremities, bilaterally. - No evidence of superficial venous thrombosis in the lower extremities, bilaterally. -   *See table(s) above for measurements and observations. Electronically signed by Deitra Mayo MD on 02/14/2020 at 4:18:52 AM.    Final     Labs:  CBC: Recent Labs    02/12/20 0528 02/13/20 1032 02/16/20 0534 02/16/20 1928  WBC 7.8 9.8 11.7* 11.5*  HGB 11.8* 12.2* 11.8* 12.2*  HCT 38.2* 38.9* 37.7* 39.5  PLT 286 298 351 322    COAGS: No results for input(s): INR, APTT in the last 8760 hours.  BMP: Recent Labs  02/11/20 1249  02/11/20 1249 02/12/20 0528 02/13/20 0530 02/13/20 1032 02/16/20 0534 02/18/20 0538  NA 141  --  143  --  140 143  --   K 4.6  --  5.0  --  4.5 4.2  --   CL 96*  --  105  --  100 106  --   CO2 25  --  30  --  24 26  --   GLUCOSE 150*  --  107*  --  105* 117*  --   BUN 99*  --  71*  --  32* 41*  --   CALCIUM 11.8*   < > 10.8* 10.5* 10.3 9.5  --   CREATININE 2.52*   < > 1.50*  --  1.07 1.00 0.94  GFRNONAA 24*   < > 45*  --  >60 >60 >60  GFRAA 28*   < > 52*  --  >60 >60 >60   < > = values in this interval not displayed.    LIVER FUNCTION TESTS: Recent Labs    02/11/20 1249 02/12/20 0528 02/16/20 0534  BILITOT 0.8 0.5 0.6  AST 41 37 73*  ALT 25 23 48*  ALKPHOS 115 101 89  PROT 8.0 6.8 6.2*  ALBUMIN 3.7 3.3* 3.1*    TUMOR MARKERS: No results for input(s): AFPTM, CEA, CA199, CHROMGRNA in the last 8760 hours.  Assessment and Plan:  T11 compression fracture with tumor: Nelda Severe. Hoque, 74 year old male, is scheduled for an osteocool thermal ablation and kyphoplasty/vertebroplasty tomorrow, 02/19/20 in Interventional Radiology.  Risks and benefits of osteocool thermal ablation and kyphoplasty/vertebroplasty were discussed with the patient including, but not limited to education regarding the natural healing process of compression fractures without intervention, bleeding, infection, cement migration which may cause spinal cord damage, paralysis, pulmonary embolism or even death. This interventional procedure involves the use of X-rays and because of the nature of the planned procedure, it is possible that we will have prolonged use of X-ray fluoroscopy. Potential radiation risks to you include (but are not limited to) the following: - A slightly elevated risk for cancer  several years later in life. This risk is typically less than 0.5% percent. This risk is low in comparison to the normal incidence of human cancer, which is 33% for women and 50% for men according to the [employer]. - Radiation induced injury can include skin redness, resembling a rash, tissue breakdown / ulcers and hair loss (which can be temporary or permanent).  The likelihood of either of these occurring depends on the difficulty of the procedure and whether you are sensitive to radiation due to previous procedures, disease, or genetic conditions.  IF your procedure requires a prolonged use of radiation, you will be notified and given written instructions for further action.  It is your responsibility to monitor the irradiated area for the 2 weeks following the procedure and to notify your physician if you are concerned that you have suffered a radiation induced injury.    All of the patient's questions were answered, patient is agreeable to proceed.  Patient will be NPO after midnight. Labs have been ordered.   Consent signed and in chart.  Thank you for this interesting consult.  I greatly enjoyed meeting Jaire Pinkham and look forward to participating in their care.  A copy of this report was sent to the requesting provider on this date.  Electronically Signed: Soyla Dryer, AGACNP-BC [phone] 02/19/2020, 1:30 PM   I spent a  total of 40 Minutes    in face to face in clinical consultation, greater than 50% of which was counseling/coordinating care for T11 osteocool thermal ablation and kyphoplasty/vertebroplasty

## 2020-02-19 NOTE — Progress Notes (Signed)
Physical Therapy Treatment Patient Details Name: George Frazier MRN: [id number] DOB: 1946-03-11 Today's Date: 02/19/2020    History of Present Illness 74 yo male admitted with hypotension, back pain, weakness. 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 Lung likely primary source.  Hx of COPD-O2 dep, CHF, AAA, medical noncompliance    PT Comments    Instructed pt in log roll technique to minimize strain to back with bed mobility. Mod A for sit to stand, min A to pivot to recliner with RW. Activity tolerance limited by fatigue. SaO2 99% on 3L with activity, HR 122 with activity, BP 130/74 in sitting.    Follow Up Recommendations  SNF (if pt is agreeable)     Equipment Recommendations  None recommended by PT (continuing to assess; may be determined at next venue if pt goes to SNF)    Recommendations for Other Services       Precautions / Restrictions Precautions Precautions: Fall;Back Precaution Comments: T-11 compression fx. Instructed pt in log roll.    Mobility  Bed Mobility Overal bed mobility: Needs Assistance Bed Mobility: Rolling;Sidelying to Sit Rolling: Supervision Sidelying to sit: Supervision       General bed mobility comments: VCs for log roll technique. Pt reported dizziness in sitting which did not resolve after sitting several minutes. Seated BP 130/74.  Transfers Overall transfer level: Needs assistance Equipment used: Rolling walker (2 wheeled) Transfers: Sit to/from Stand Sit to Stand: Mod assist;+2 safety/equipment;+2 physical assistance  Stand pivot transfer: min A with RW, VCs for safety         General transfer comment: Assist to power up, stabilize, control descent. VCs safety, technique, hand placement. Increased time. Shaky and dyspneic. HR 122 with activity, SaO2 99% on 3L O2 [place] with activity.  Ambulation/Gait             General Gait Details: deferred 2* fatigue   Stairs             Wheelchair Mobility     Modified Rankin (Stroke Patients Only)       Balance Overall balance assessment: Needs assistance         Standing balance support: Bilateral upper extremity supported Standing balance-Leahy Scale: Poor                              Cognition Arousal/Alertness: Awake/alert Behavior During Therapy: WFL for tasks assessed/performed Overall Cognitive Status: Within Functional Limits for tasks assessed                                        Exercises      General Comments        Pertinent Vitals/Pain Pain Score: 6  Pain Location: back Pain Descriptors / Indicators: Discomfort;Sore;Aching Pain Intervention(s): Limited activity within patient's tolerance;Monitored during session;Premedicated before session;Repositioned    Home Living                      Prior Function            PT Goals (current goals can now be found in the care plan section) Acute Rehab PT Goals Patient Stated Goal: less pain. to be able to go home PT Goal Formulation: With patient Time For Goal Achievement: 03/02/20 Potential to Achieve Goals: Fair Progress towards PT goals: Not progressing toward goals -  comment (fatigue and pain limiting progress with mobility)    Frequency    Min 3X/week      PT Plan Current plan remains appropriate    Co-evaluation              AM-PAC PT "6 Clicks" Mobility   Outcome Measure  Help needed turning from your back to your side while in a flat bed without using bedrails?: A Little Help needed moving from lying on your back to sitting on the side of a flat bed without using bedrails?: A Little Help needed moving to and from a bed to a chair (including a wheelchair)?: A Lot Help needed standing up from a chair using your arms (e.g., wheelchair or bedside chair)?: A Lot Help needed to walk in hospital room?: A Lot Help needed climbing 3-5 steps with a railing? : Total 6 Click Score: 13    End of Session  Equipment Utilized During Treatment: Gait belt;Oxygen Activity Tolerance: Patient limited by fatigue Patient left: in chair;with call bell/phone within reach;with chair alarm set Nurse Communication: Mobility status PT Visit Diagnosis: Muscle weakness (generalized) (M62.81);Difficulty in walking, not elsewhere classified (R26.2)     Time: 1400-1416 PT Time Calculation (min) (ACUTE ONLY): 16 min  Charges:  $Therapeutic Activity: 8-22 mins                     Blondell Reveal Kistler PT 02/19/2020  Acute Rehabilitation Services Pager [phone] Office [phone]

## 2020-02-19 NOTE — Progress Notes (Signed)
Daily Progress Note   George Frazier Name: George Frazier       Date: 02/19/2020 DOB: 17-Mar-1946  Age: 74 y.o. MRN#: [id number] Attending Physician: Debbe Odea, MD Primary Care Physician: George Frazier, No Pcp Per Admit Date: 02/11/2020  Reason for Consultation/Follow-up: Establishing goals of care  Subjective:  George Frazier is awake alert resting in bed Took 2 doses of Oxy IR PO PRN for his back pain.  We discussed about his meeting with med onc yesterday evening, he is considering SNF rehab.     Appetite is fair See below.   Length of Stay: 8  Current Medications: Scheduled Meds:  . amLODipine  10 mg Oral Daily  . atorvastatin  80 mg Oral q1800  . dexamethasone (DECADRON) injection  10 mg Intravenous Q12H  . enoxaparin (LOVENOX) injection  40 mg Subcutaneous Q24H  . feeding supplement (ENSURE ENLIVE)  237 mL Oral TID BM  . fluticasone furoate-vilanterol  1 puff Inhalation Daily   And  . umeclidinium bromide  1 puff Inhalation Daily    Continuous Infusions:   PRN Meds: acetaminophen **OR** acetaminophen, albuterol, hydrALAZINE, HYDROmorphone (DILAUDID) injection, ondansetron **OR** ondansetron (ZOFRAN) IV, oxyCODONE, senna-docusate  Physical Exam         Awake alert No distress mild back pain Regular work of breathing S1 S2 Abdomen not distended No edema Non focal   Vital Signs: BP (!) 153/99 (BP Location: Left Arm)   Pulse 95   Temp 98.4 F (36.9 C) (Oral)   Resp 18   Ht 6' (1.829 m)   Wt 78.5 kg   SpO2 97%   BMI 23.46 kg/m  SpO2: SpO2: 97 % O2 Device: O2 Device: Room Air O2 Flow Rate: O2 Flow Rate (L/min): 3 L/min  Intake/output summary:   Intake/Output Summary (Last 24 hours) at 02/19/2020 1110 Last data filed at 02/19/2020 8867 Gross per 24 hour  Intake 840 ml    Output 1800 ml  Net -960 ml   LBM: Last BM Date: 02/17/20 Baseline Weight: Weight: 78.5 kg Most recent weight: Weight: 78.5 kg       Palliative Assessment/Data:    Flowsheet Rows     Most Recent Value  Intake Tab  Referral Department Hospitalist  Unit at Time of Referral Med/Surg Unit  Palliative Care Primary Diagnosis Cancer  Date Notified 02/12/20  Date of Admission 02/11/20  Date first seen by Palliative Care 02/14/20  # of days Palliative referral response time 2 Day(s)  # of days IP prior to Palliative referral 1  Clinical Assessment  Palliative Performance Scale Score 40%  Psychosocial & Spiritual Assessment  Palliative Care Outcomes  George Frazier/Family meeting held? Yes  Who was at the meeting? George Frazier, daughter, nephew      George Frazier Active Problem List   Diagnosis Date Noted  . Malignant neoplasm metastatic to bone ([place])   . Dehydration   . Pressure injury of skin 02/12/2020  . AKI (acute kidney injury) ([place]) 02/11/2020  . AAA (abdominal aortic aneurysm) without rupture ([place]) 09/15/2019  . H/O noncompliance with medical treatment, presenting hazards to health 09/04/2019  . History of prediabetes-  A1c 6.1 in December 2018 12/23/2018  . Solitary pulmonary nodule- R middle lobe- followed by pulm 08/20/2018  . Accelerated hypertension 08/08/2018  . Hyperlipidemia 12/13/2017  . Vitamin D insufficiency 12/13/2017  . Physical deconditioning 11/15/2017  . Hypertension 11/06/2017  . Stopped smoking 2008 with greater than 40 pack year history 11/06/2017  . Chronic fatigue 11/06/2017  . Dyspnea on minimal exertion-likely due to COPD 11/06/2017  . Chronic respiratory failure with hypoxia ([place]) 09/17/2017  . CAP (community acquired pneumonia) 07/14/2017  . COPD, group D, by GOLD 2017 classification ([place]) 07/14/2017  . Heart failure ([place]) 07/14/2017    Palliative Care Assessment & Plan   George Frazier Profile:  74 year old male with prior history of COPD on home oxygen,  lung nodule, hypertension, chronic diastolic heart failure presents to ED for generalized weakness and decreased appetite  Assessment:  generalized weakness, back pain, decreased appetite Constipation Hyper calcemia HTN Solitary pulmonary nodule T 5 para spinal mass biopsy, results pending.   Recommendations/Plan: Med history noted, discussed with George Frazier. He is on Decadron, Tylenol and Dilaudid PRN. Continue PO Oxy IR PRN.   Continue to monitor.   PT eval, likely SNF rehab with palliative on discharge.  PPS 40%  Code Status:    Code Status Orders  (From admission, onward)         Start     Ordered   02/11/20 1710  Do not attempt resuscitation (DNR)  Continuous       Question Answer Comment  In the event of cardiac or respiratory ARREST Do not call a "code blue"   In the event of cardiac or respiratory ARREST Do not perform Intubation, CPR, defibrillation or ACLS   In the event of cardiac or respiratory ARREST Use medication by any route, position, wound care, and other measures to relive pain and suffering. May use oxygen, suction and manual treatment of airway obstruction as needed for comfort.      02/11/20 1713        Code Status History    Date Active Date Inactive Code Status Order ID Comments User Context   08/08/2018 2207 08/10/2018 1708 Full Code [id number]  Bonnielee Haff, MD Inpatient   09/17/2017 1847 09/21/2017 1626 Full Code [id number]  Evans Lance, MD ED   07/14/2017 2049 07/18/2017 1847 DNR [id number]  Karmen Bongo, MD Inpatient   Advance Care Planning Activity       Prognosis:   Unable to determine  Discharge Planning:  To Be Determined   SNF rehab with palliative, outpatient med onc follow up.   Care plan was discussed with  George Frazier.   Thank you for allowing the Palliative Medicine Team to assist in the care of this George Frazier.  Time In: 10 Time Out: 10.25 Total Time 25 Prolonged Time Billed No       Greater than 50%  of this time was spent  counseling and coordinating care related to the above assessment and plan.  Loistine Chance, MD  Please contact Palliative Medicine Team phone at [phone] for questions and concerns.

## 2020-02-19 NOTE — Progress Notes (Addendum)
PROGRESS NOTE    George Frazier   NFA:[id number]  DOB: 21-Jun-1946  DOA: 02/11/2020 PCP: Patient, No Pcp Per   Brief Narrative:  George Frazier COPD Gold stage D on 2 L home oxygen, lung nodule, hypertension, chronic diastolic heart failure presents to ED for generalized weakness and decreased appetite x 2 wks.  In ED : SBP in 60-80, tachycardic with a lactic acid of 3.3, elevated Cr of 2.52, calcium 11.8 with normal albumin, dyspnea and a d dimer of > 20. Given 3 L ringer's lactate.   He also complained of back pain.   7/28 -MRI> 1. Abnormal marrow signal intensity throughout the thoracic spine, consistent with metastatic disease or myeloma. 2. Complete involvement of the T11 vertebral body with associated pathologic compression fracture, with 5 mm of retropulsed bone and/or tumor protruding into the spinal canal. Resultant mild spinal stenosis without frank cord compression or cord signal changes. 3. Additional prominent tumor involvement involving the right posterior elements of T5 with associated adjacent paraspinous edema.  VQ scan: : Diffusely heterogeneous lung perfusion without discrete wedge-shaped perfusion defect.  7/29 CT chest abdomen pelvis:  - 10 mm spiculated right upper lobe pulmonary nodule - 5 mm posterior right upper lobe pulmonary nodule. Metastatic disease not excluded. Close attention on follow-up recommended. - 1.8 cm suspected lesion in the dome of the lateral segment left liver, not well evaluated on this noncontrast exam metastatic disease a concern. -Metastatic disease identified in the manubrium and bulky soft tissue metastatic deposit involves the right T5 pedicle, transverse process and posterior right fifth rib.  Subjective: He states the Oxycodone is helping his back pain. He is considering going to SNF now. He will follow up with Dr Lorenso Courier as outpatient.     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 5 mm of retropulsed bone and/or tumor protrudes into the spinal canal  - back pain well controlled at rest with Oxycodone today but oncology feels that the only way to stabilize the spine is by doing a kyphoplasty- d/w IR who will see if it can be done tomorrow - he is unable to ambulate and has chosen to go to a SNF- SW working on this plan  Hypercalcemia - likely due to dehydration- improved to normal  Anemia  - check anemia panel  HTN - cont Amlodipine and follow    COPD, group D, by GOLD 2017 classification    Chronic respiratory failure with hypoxia  - this is stable    Time spent in minutes: 35 DVT prophylaxis: Lovenox Code Status: DNR Family Communication:   Disposition Plan:  Status is: Inpatient  Remains inpatient appropriate because not safe d/c until SNF bed is found- also  considering radiation to met in spine vs kyphoplasty   Dispo: The patient is from: Home              Anticipated d/c is to: SNF              Anticipated d/c date is: 1 day              Patient currently is not medically stable to d/c.      Consultants:   Palliative care  oncology Procedures:   Biopsy of thoracic  mass Antimicrobials:  Anti-infectives (From admission, onward)   None       Objective: Vitals:   02/18/20 1312 02/18/20 2039 02/19/20 0640 02/19/20 0806  BP: (!) 143/84 125/81 (!) 153/99   Pulse: 87 81 95   Resp: '17 18 18   ' Temp: 98 F (36.7 C) (!) 97.3 F (36.3 C) 98.4 F (36.9 C)   TempSrc: Oral Oral Oral   SpO2: 99% 97% 96% 97%  Weight:      Height:        Intake/Output Summary (Last 24 hours) at 02/19/2020 1216 Last data filed at 02/19/2020 0916 Gross per 24 hour  Intake 840 ml  Output 1800 ml  Net -960 ml   Filed Weights   02/11/20 1151  Weight: 78.5 kg    Examination: General exam: Appears comfortable  HEENT: PERRLA, oral mucosa moist, no sclera icterus or thrush Respiratory system: Clear to auscultation. Respiratory effort normal. Cardiovascular system: S1 & S2 heard,  No murmurs  Gastrointestinal system: Abdomen soft, non-tender, nondistended. Normal bowel sounds   Central nervous system: Alert and oriented. No focal neurological deficits. Extremities: No cyanosis, clubbing or edema Skin: No rashes or ulcers Psychiatry:  Mood & affect appropriate.     Data Reviewed: I have personally reviewed following labs and imaging studies  CBC: Recent Labs  Lab 02/13/20 1032 02/16/20 0534 02/16/20 1928  WBC 9.8 11.7* 11.5*  HGB 12.2* 11.8* 12.2*  HCT 38.9* 37.7* 39.5  MCV 95.6 94.3 96.1  PLT 298 351 595   Basic Metabolic Panel: Recent Labs  Lab 02/13/20 0530 02/13/20 1032 02/16/20 0534 02/18/20 0538  NA  --  140 143  --   K  --  4.5 4.2  --   CL  --  100 106  --   CO2  --  24 26  --   GLUCOSE  --  105* 117*  --   BUN  --  32* 41*  --   CREATININE  --  1.07 1.00 0.94  CALCIUM 10.5* 10.3 9.5  --    GFR: Estimated Creatinine Clearance: 75.7 mL/min (by C-G formula based on SCr of 0.94 mg/dL). Liver Function Tests: Recent Labs  Lab 02/16/20 0534  AST 73*  ALT 48*  ALKPHOS 89  BILITOT 0.6  PROT 6.2*  ALBUMIN 3.1*   No results for input(s):  LIPASE, AMYLASE in the last 168 hours. No results for input(s): AMMONIA in the last 168 hours. Coagulation Profile: No results for input(s): INR, PROTIME in the last 168 hours. Cardiac Enzymes: No results for input(s): CKTOTAL, CKMB, CKMBINDEX, TROPONINI in the last 168 hours. BNP (last 3 results) No results for input(s): PROBNP in the last 8760 hours. HbA1C: No results for input(s): HGBA1C in the last 72 hours. CBG: No results for input(s): GLUCAP in the last 168 hours. Lipid Profile: No results for input(s): CHOL, HDL, LDLCALC, TRIG, CHOLHDL, LDLDIRECT in the last 72 hours. Thyroid Function Tests: No results  for input(s): TSH, T4TOTAL, FREET4, T3FREE, THYROIDAB in the last 72 hours. Anemia Panel: No results for input(s): VITAMINB12, FOLATE, FERRITIN, TIBC, IRON, RETICCTPCT in the last 72 hours. Urine analysis:    Component Value Date/Time   COLORURINE STRAW (A) 02/11/2020 2030   APPEARANCEUR CLEAR 02/11/2020 2030   [place] 1.012 02/11/2020 2030   [place] 5.0 02/11/2020 2030   GLUCOSEU NEGATIVE 02/11/2020 2030   [place] (A) 02/11/2020 2030   [place] NEGATIVE 02/11/2020 2030   [place] 02/11/2020 2030   PROTEINUR NEGATIVE 02/11/2020 2030   NITRITE NEGATIVE 02/11/2020 2030   LEUKOCYTESUR NEGATIVE 02/11/2020 2030   Sepsis Labs: '@LABRCNTIP' (procalcitonin:4,lacticidven:4) ) Recent Results (from the past 240 hour(s))  SARS Coronavirus 2 by RT PCR (hospital order, performed in [place] hospital lab) Nasopharyngeal Nasopharyngeal Swab     Status: None   Collection Time: 02/11/20  1:24 PM   Specimen: Nasopharyngeal Swab  Result Value Ref Range Status   SARS Coronavirus 2 NEGATIVE NEGATIVE Final    Comment: (NOTE) SARS-CoV-2 target nucleic acids are NOT DETECTED.  The SARS-CoV-2 RNA is generally detectable in upper and lower respiratory specimens during the acute phase of infection. The lowest concentration of SARS-CoV-2 viral copies this assay can detect is  250 copies / mL. A negative result does not preclude SARS-CoV-2 infection and should not be used as the sole basis for treatment or other patient management decisions.  A negative result may occur with improper specimen collection / handling, submission of specimen other than nasopharyngeal swab, presence of viral mutation(s) within the areas targeted by this assay, and inadequate number of viral copies (<250 copies / mL). A negative result must be combined with clinical observations, patient history, and epidemiological information.  Fact Sheet for Patients:   StrictlyIdeas.no  Fact Sheet for Healthcare Providers: BankingDealers.co.za  This test is not yet approved or  cleared by the [place] FDA and has been authorized for detection and/or diagnosis of SARS-CoV-2 by FDA under an Emergency Use Authorization (EUA).  This EUA will remain in effect (meaning this test can be used) for the duration of the COVID-19 declaration under Section 564(b)(1) of the Act, 21 U.S.C. section 360bbb-3(b)(1), unless the authorization is terminated or revoked sooner.  Performed at [facility], [place] [address], [place], [place] [address]   Urine culture     Status: Abnormal   Collection Time: 02/11/20  8:30 PM   Specimen: Urine, Clean Catch  Result Value Ref Range Status   Specimen Description   Final    URINE, CLEAN CATCH Performed at [facility], [place] [address], [place], [place] [address]    Special Requests   Final    NONE Performed at [facility], [place] [address] [place], [place] [address]    Culture (A)  Final    <10,000 COLONIES/mL INSIGNIFICANT GROWTH Performed at [facility] [address] [place], [place] [address]    Report Status 02/12/2020 FINAL  Final      Radiology Studies: MR BRAIN W WO CONTRAST  Result Date: 02/19/2020 CLINICAL DATA:  Non-small cell lung  cancer, staging. EXAM: MRI HEAD WITHOUT AND WITH CONTRAST TECHNIQUE: Multiplanar, multiecho pulse sequences of the brain and surrounding structures were obtained without and with intravenous contrast. CONTRAST:  54m GADAVIST GADOBUTROL 1 MMOL/ML IV SOLN COMPARISON:  Head CT 09/17/2017. FINDINGS: Brain: Stable, moderate generalized parenchymal atrophy. No abnormal enhancement is demonstrated to suggest intracranial metastatic disease. There is a small chronic cortically based  infarct within the right occipital lobe. Mild scattered T2/FLAIR hyperintensity within the cerebral white matter and pons is nonspecific, but consistent with chronic small vessel ischemic disease. There is no acute infarct. No evidence of intracranial mass. No chronic intracranial blood products. No extra-axial fluid collection. No midline shift. Vascular: Expected proximal arterial flow voids. Skull and upper cervical spine: No focal marrow lesion. Sinuses/Orbits: Visualized orbits show no acute finding. Tiny right maxillary sinus mucous retention cysts. No significant mastoid effusion. IMPRESSION: No evidence of intracranial metastatic disease. 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 Moderate generalized parenchymal atrophy with mild chronic small vessel ischemic disease. Tiny right maxillary sinus mucous retention cysts. Electronically Signed   By: Kellie Simmering DO   On: 02/19/2020 11:48      Scheduled Meds: . amLODipine  10 mg Oral Daily  . atorvastatin  80 mg Oral q1800  . dexamethasone (DECADRON) injection  10 mg Intravenous Q12H  . enoxaparin (LOVENOX) injection  40 mg Subcutaneous Q24H  . feeding supplement (ENSURE ENLIVE)  237 mL Oral TID BM  . fluticasone furoate-vilanterol  1 puff Inhalation Daily   And  . umeclidinium bromide  1 puff Inhalation Daily   Continuous Infusions:   LOS: 8 days      Debbe Odea, MD [facility] Pager: [email or web address] 02/19/2020, 12:16 PM

## 2020-02-19 NOTE — Plan of Care (Signed)
  Problem: Skin Integrity: Goal: Risk for impaired skin integrity will decrease Outcome: Progressing   Problem: Health Behavior/Discharge Planning: Goal: Ability to manage health-related needs will improve Outcome: Progressing

## 2020-02-20 ENCOUNTER — Inpatient Hospital Stay (HOSPITAL_COMMUNITY): Payer: Medicare Other

## 2020-02-20 HISTORY — PX: IR BONE TUMOR(S)RF ABLATION: IMG2284

## 2020-02-20 HISTORY — PX: IR KYPHO EA ADDL LEVEL THORACIC OR LUMBAR: IMG5520

## 2020-02-20 LAB — PROTIME-INR
INR: 1.1 (ref 0.8–1.2)
Prothrombin Time: 13.8 seconds (ref 11.4–15.2)

## 2020-02-20 LAB — CBC
HCT: 36.2 % — ABNORMAL LOW (ref 39.0–52.0)
Hemoglobin: 11.2 g/dL — ABNORMAL LOW (ref 13.0–17.0)
MCH: 29.6 pg (ref 26.0–34.0)
MCHC: 30.9 g/dL (ref 30.0–36.0)
MCV: 95.8 fL (ref 80.0–100.0)
Platelets: 249 10*3/uL (ref 150–400)
RBC: 3.78 MIL/uL — ABNORMAL LOW (ref 4.22–5.81)
RDW: 13.2 % (ref 11.5–15.5)
WBC: 13.9 10*3/uL — ABNORMAL HIGH (ref 4.0–10.5)
nRBC: 0 % (ref 0.0–0.2)

## 2020-02-20 IMAGING — XA IR KYPHO VERTEBRAL ADDL AGMENTATION
10 of 14 series · 13 of 20 positions shown · non-contrast
Comparison: none

INDICATION: 74-year-old male with a history of metastatic adenocarcinoma,
symptomatic pathologic fracture of T11, presents 4 radiofrequency
ablation and vertebral augmentation

[Series 2: kp/vp · 1 of 1 slices shown (1 of 8)]
[im 1/1]
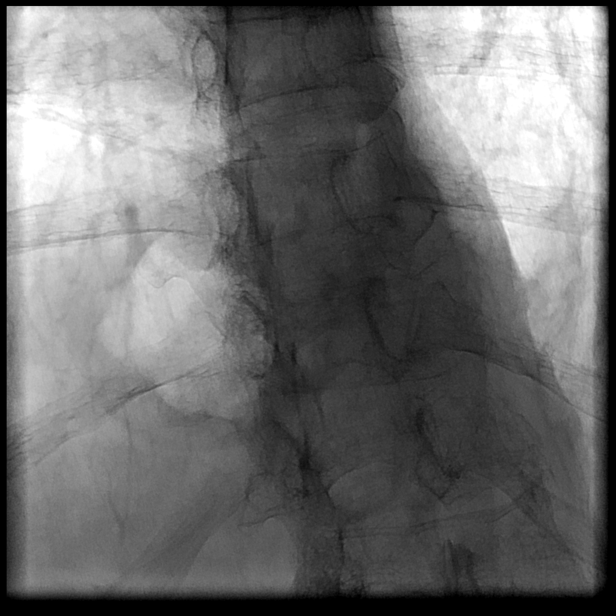

[Series 5: kp/vp · 1 of 1 slices shown (2 of 8)]
[im 1/1]
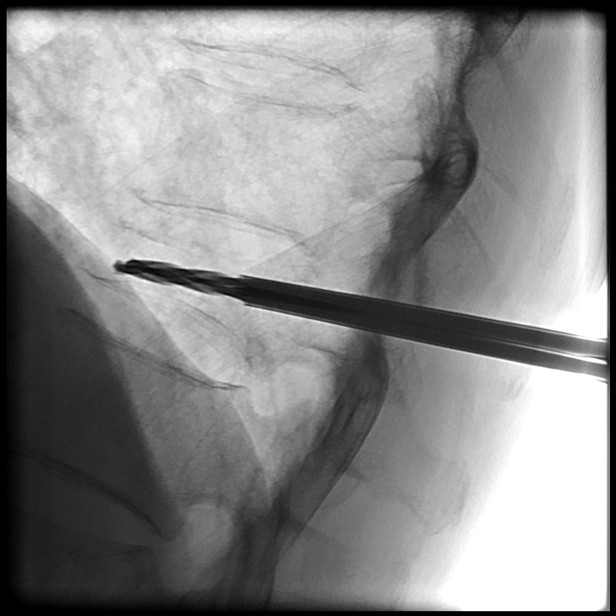

[Series 8: kp/vp · 1 of 1 slices shown (3 of 8)]
[im 1/1]
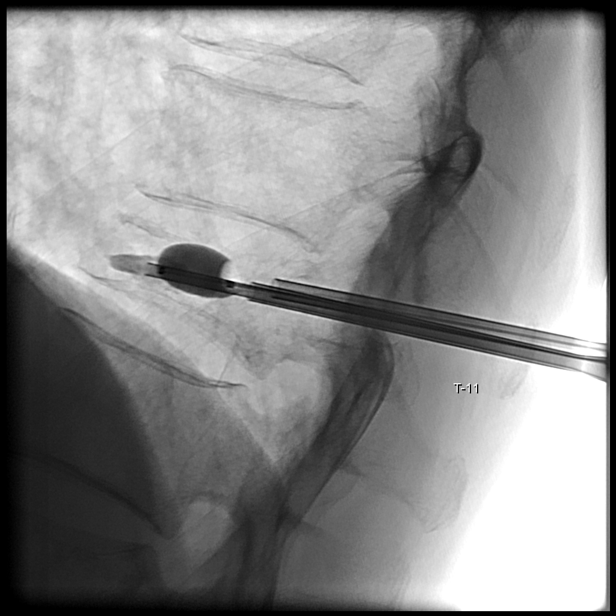

[Series 10: kp/vp · 1 of 1 slices shown (4 of 8)]
[im 1/1]
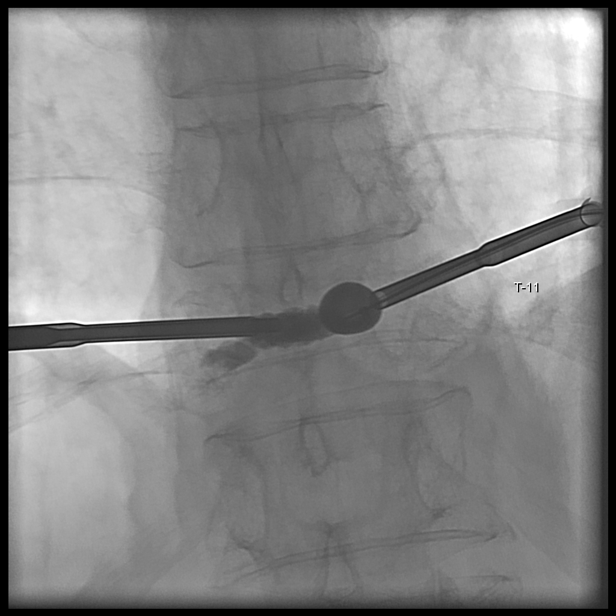

[Series 11: kp/vp · 1 of 1 slices shown (5 of 8)]
[im 1/1]
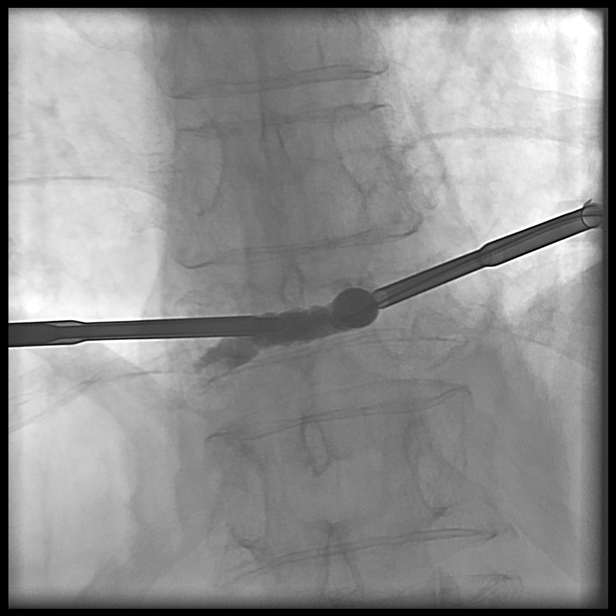

[Series 13: kp/vp · 1 of 1 slices shown (6 of 8)]
[im 1/1]
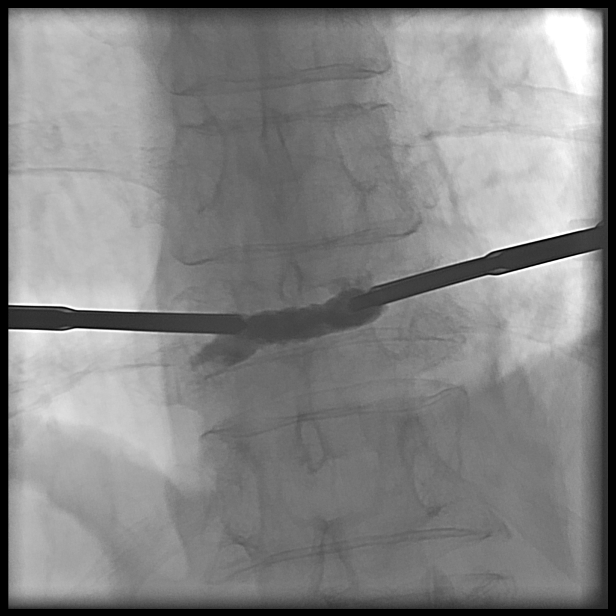

[Series 16: kp/vp · 1 of 1 slices shown (7 of 8)]
[im 1/1]
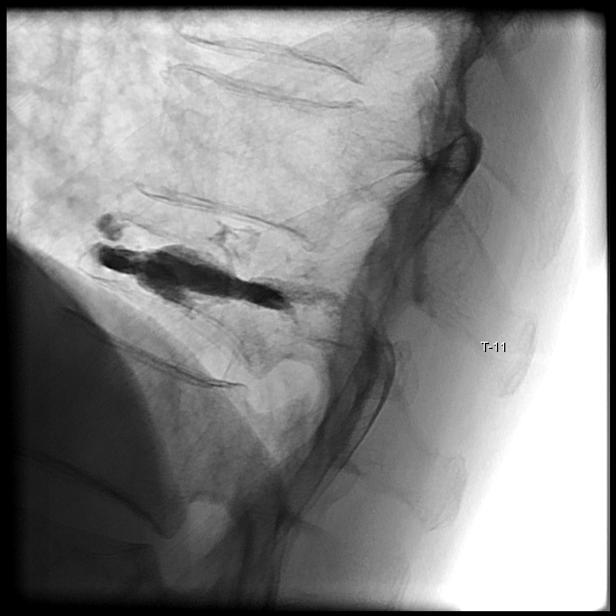

[Series 17: kp/vp · 1 of 1 slices shown (8 of 8)]
[im 1/1]
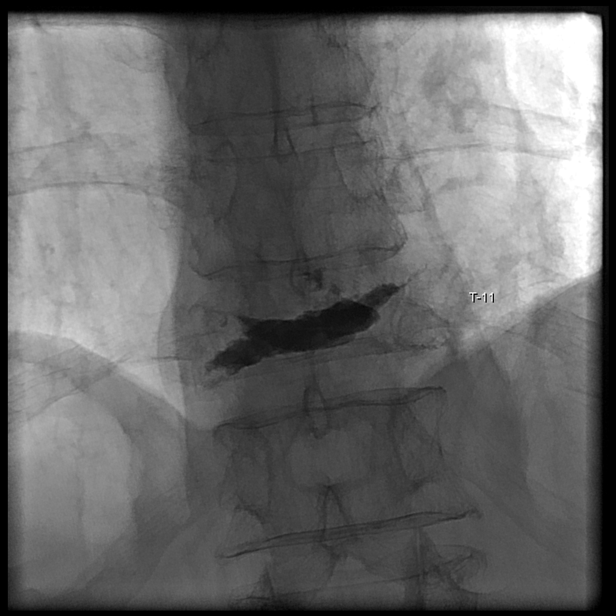

[Series 100: spine · 2 of 3 slices shown (1 of 2)]
[im 2/3]
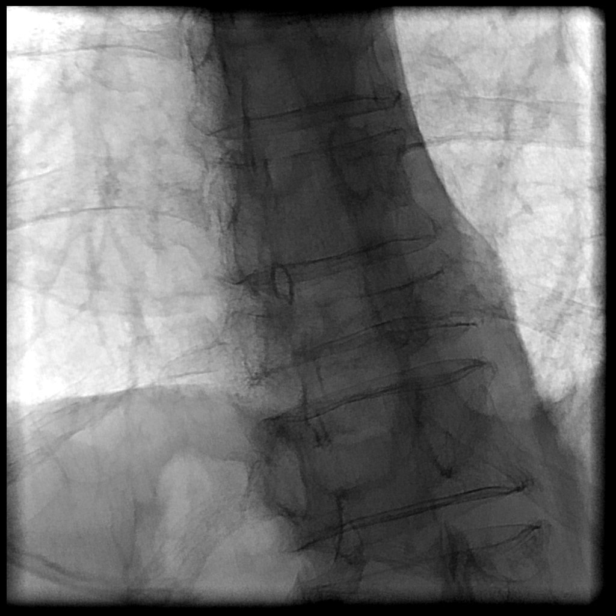
[im 3/3]
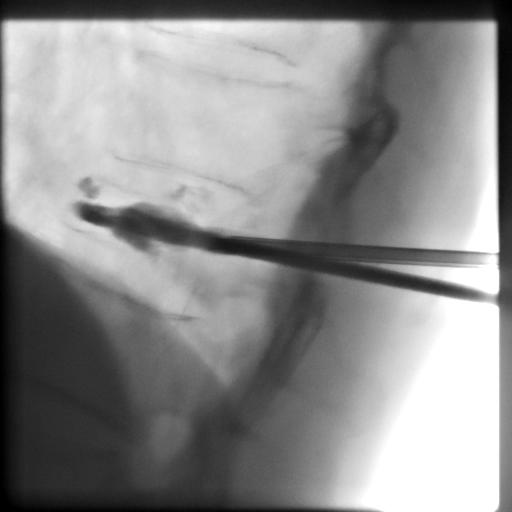

[Series 300: spine · 3 of 5 slices shown (2 of 2)]
[im 2/5]
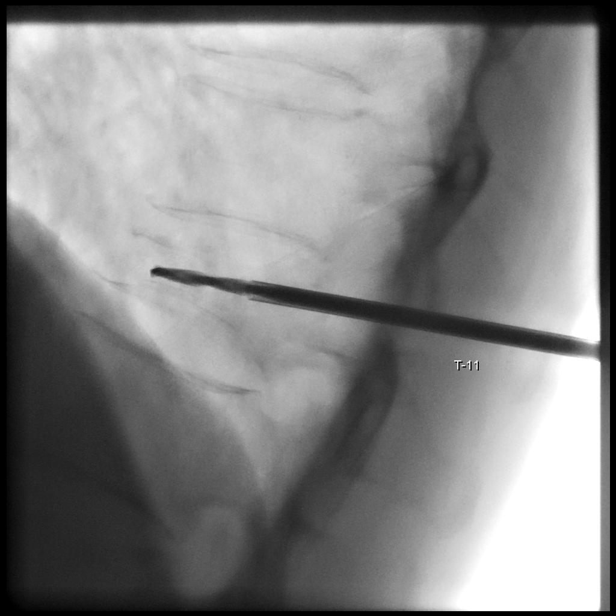
[im 3/5]
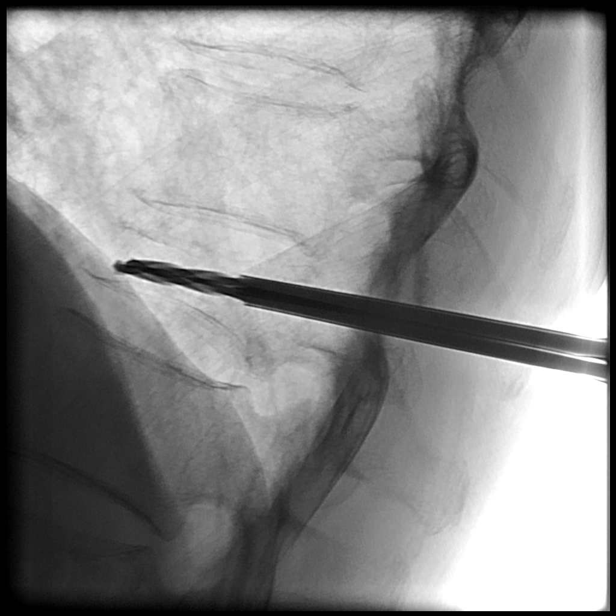
[im 5/5]
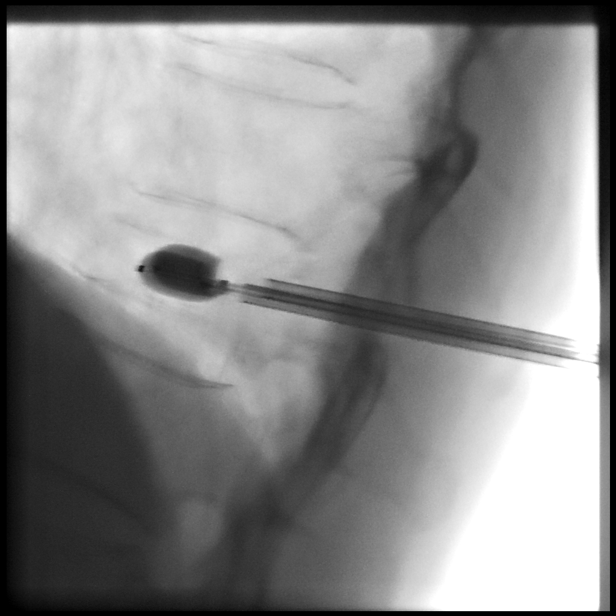

[13 of 20 positions shown; findings below may reference images not displayed]

EXAM:
IMAGE GUIDED RADIOFREQUENCY ABLATION OF T11 PATHOLOGIC FRACTURE

BIPEDICULAR APPROACH KYPHOPLASTY T11

MEDICATIONS:
As antibiotic prophylaxis, 2 G ANCEF was ordered pre-procedure and
administered intravenously within 1 hour of incision.

ANESTHESIA/SEDATION:
Moderate (conscious) sedation was employed during this procedure. A
total of Versed 3.0 mg and Fentanyl 200 mcg was administered
intravenously.

Moderate Sedation Time: 47 minutes. The patient's level of
consciousness and vital signs were monitored continuously by
radiology nursing throughout the procedure under my direct
supervision.

FLUOROSCOPY TIME:  Fluoroscopy Time: 6 minutes 30 seconds (119 mGy)

COMPLICATIONS:
NONE

PROCEDURE:
Following a full explanation of the procedure along with the
potentially associated complications, a witnessed informed consent
was obtained. Specific risks that were discussed included bleeding,
infection, injury to adjacent structures, neurologic injury,
embolization of cement within the veins, failure of the procedure to
improve pain, need for further procedure/ surgery, cardiopulmonary
collapse, death. The patient understands the risks and wishes to
proceed.

The patient was placed prone on the fluoroscopic table. Nasal oxygen
was administered. Physiologic monitoring was performed throughout
the duration of the procedure. The skin overlying the T11 region was
prepped and draped in the usual sterile fashion. The T11 vertebral
body was identified and the RIGHT pedicle was infiltrated with 1%
lidocaine. This was then followed by the advancement of [REDACTED]
Osteocool Trocar needle through the RIGHT pedicle into the posterior
one-third.

The left pedicle was then infiltrated with 1% lidocaine. A small
incision was made with 11 blade scalpel, and then [REDACTED]
Osteocool Trocar needle was advanced through the left pedicle into
the posterior [DATE] of the vertebral body.

The bone drill/measuring tool was then advanced through the
bilateral pedicles to estimate length of the required Osteocool
ablation device. 15 mm device selected both pedicle.

Once position was confirmed, we initiated the treatment. Upon the
first initiation, the left-sided pedicle RF antenna demonstrated a
malfunction. After troubleshooting with the [REDACTED]
representative, we replaced the left probe. Successful initiation
was then achieved, and ablation was performed for total of 11
minutes 30 second, bilateral.

The RFA ablation devices were then removed. Balloon cannula were
advanced bilaterally.

Simultaneous inflation of the left and right pedicular cannula
balloons was performed under fluoroscopic observation.

Methylmethacrylate mixture was then reconstituted. Under biplane
intermittent fluoroscopy, the methylmethacrylate was then injected
into both the cavity and the interstices of vertebral body with
filling of the space.

No extravasation was noted posteriorly into the spinal canal. No
epidural venous contamination was seen. Trace contamination of the
T10-T11 disc space.

The needles were then removed. Hemostasis was achieved at the skin
entry site.

There were no acute complications. Patient tolerated the procedure
well. The patient was observed for 30 minutes and returned to room
in good condition.
IMPRESSION: Status post image guided T11 RF ablation and bipedicular approach
kyphoplasty for symptomatic pathologic fracture.

## 2020-02-20 MED ORDER — CEFAZOLIN SODIUM-DEXTROSE 2-4 GM/100ML-% IV SOLN
INTRAVENOUS | Status: AC
  Administered 2020-02-20: 2000 mg
  Filled 2020-02-20: qty 100

## 2020-02-20 MED ORDER — MIDAZOLAM HCL 2 MG/2ML IJ SOLN
INTRAMUSCULAR | Status: AC | PRN
  Administered 2020-02-20 (×3): 1 mg via INTRAVENOUS

## 2020-02-20 MED ORDER — FENTANYL CITRATE (PF) 100 MCG/2ML IJ SOLN
INTRAMUSCULAR | Status: AC | PRN
  Administered 2020-02-20 (×4): 50 ug via INTRAVENOUS

## 2020-02-20 MED ORDER — LIDOCAINE HCL (PF) 1 % IJ SOLN
INTRAMUSCULAR | Status: AC | PRN
  Administered 2020-02-20: 10 mL

## 2020-02-20 MED ORDER — MIDAZOLAM HCL 2 MG/2ML IJ SOLN
INTRAMUSCULAR | Status: AC
  Filled 2020-02-20: qty 6

## 2020-02-20 MED ORDER — FENTANYL CITRATE (PF) 100 MCG/2ML IJ SOLN
INTRAMUSCULAR | Status: AC
  Filled 2020-02-20: qty 4

## 2020-02-20 MED ORDER — IOHEXOL 300 MG/ML  SOLN: 50.0000 mL | Freq: Once | INTRAMUSCULAR | Status: DC | PRN

## 2020-02-20 MED ORDER — LIDOCAINE HCL (PF) 1 % IJ SOLN
INTRAMUSCULAR | Status: AC
  Filled 2020-02-20: qty 30

## 2020-02-20 NOTE — TOC Progression Note (Signed)
Transition of Care [facility]) - Progression Note    Patient Details  Name: George Frazier MRN: [id number] Date of Birth: [birth year]  [facility] of [facility]) CM/SW Contact  Lennart Pall, LCSW Phone Number: 02/20/2020, 3:57 PM  Clinical Narrative:    Met with pt and spoke with daughter via phone following IR procedure.  Discussed recommendation still for SNF and have sent information out to [facility] (preferred by daughter), however, that facility is unable to offer a bed.  Pt is, also, still non-committal to the SNF plan and daughter to discuss with him further today. Daughter is aware that MD feels he may be medically ready for dc over the weekend.  Daughter agreeable for this SW to send information out to other local SNFs.  Will ask weekend TOC coverage to follow up tomorrow with pt and daughter to confirm agreement to SNF and to review facility offers.   Expected Discharge Plan: [place] Barriers to Discharge: Continued Medical Work up  Expected Discharge Plan and Services Expected Discharge Plan: [place] In-house Referral: Clinical Social Work     Living arrangements for the past 2 months: Single Family Home                                       Social Determinants of Health (SDOH) Interventions    Readmission Risk Interventions No flowsheet data found.

## 2020-02-20 NOTE — NC FL2 (Signed)
[place] LEVEL OF CARE SCREENING TOOL     IDENTIFICATION  Patient Name: George Frazier Birthdate: 05-19-1946 Sex: male Admission Date (Current Location): 02/11/2020  [facility] and [place] Number:  Herbalist and Address:  [facility],  [place], [place]      Provider Number: [id number]  Attending Physician Name and Address:  Debbe Odea, MD  Relative Name and Phone Number:  daughter, Brooke Bonito @ [phone]    Current Level of Care: Hospital Recommended Level of Care: [place] Prior Approval Number:    Date Approved/Denied:   PASRR Number: [id number]  Discharge Plan: SNF    Current Diagnoses: Patient Active Problem List   Diagnosis Date Noted  . Malignant neoplasm metastatic to bone ([place])   . Dehydration   . Pressure injury of skin 02/12/2020  . AKI (acute kidney injury) ([place]) 02/11/2020  . AAA (abdominal aortic aneurysm) without rupture ([place]) 09/15/2019  . H/O noncompliance with medical treatment, presenting hazards to health 09/04/2019  . History of prediabetes-  A1c 6.1 in December 2018 12/23/2018  . Solitary pulmonary nodule- R middle lobe- followed by pulm 08/20/2018  . Accelerated hypertension 08/08/2018  . Hyperlipidemia 12/13/2017  . Vitamin D insufficiency 12/13/2017  . Physical deconditioning 11/15/2017  . Hypertension 11/06/2017  . Stopped smoking 2008 with greater than 40 pack year history 11/06/2017  . Chronic fatigue 11/06/2017  . Dyspnea on minimal exertion-likely due to COPD 11/06/2017  . Chronic respiratory failure with hypoxia ([place]) 09/17/2017  . CAP (community acquired pneumonia) 07/14/2017  . COPD, group D, by GOLD 2017 classification ([place]) 07/14/2017  . Heart failure ([place]) 07/14/2017    Orientation RESPIRATION BLADDER Height & Weight     Self, Time, Situation, Place  O2 Continent Weight: 173 lb (78.5 kg) Height:  6' (182.9 cm)  BEHAVIORAL SYMPTOMS/MOOD  NEUROLOGICAL BOWEL NUTRITION STATUS      Incontinent, Continent    AMBULATORY STATUS COMMUNICATION OF NEEDS Skin   Extensive Assist Verbally Normal                       Personal Care Assistance Level of Assistance  Bathing, Dressing Bathing Assistance: Limited assistance   Dressing Assistance: Limited assistance     Functional Limitations Info             SPECIAL CARE FACTORS FREQUENCY  PT (By licensed PT), OT (By licensed OT)     PT Frequency: 5x/wk OT Frequency: 5x/wk            Contractures      Additional Factors Info  Code Status, Allergies Code Status Info: DNR Allergies Info: NKDA           Current Medications (02/20/2020):  This is the current hospital active medication list Current Facility-Administered Medications  Medication Dose Route Frequency Provider Last Rate Last Admin  . acetaminophen (TYLENOL) tablet 650 mg  650 mg Oral Q6H PRN Antonieta Pert, MD   650 mg at 02/19/20 2128   Or  . acetaminophen (TYLENOL) suppository 650 mg  650 mg Rectal Q6H PRN Kc, Ramesh, MD      . albuterol (PROVENTIL) (2.5 MG/3ML) 0.083% nebulizer solution 2.5 mg  2.5 mg Nebulization Q2H PRN Kc, Ramesh, MD   2.5 mg at 02/14/20 2204  . amLODipine (NORVASC) tablet 10 mg  10 mg Oral Daily Hosie Poisson, MD   10 mg at 02/19/20 1141  . atorvastatin (LIPITOR) tablet 80 mg  80  mg Oral [id number] Antonieta Pert, MD   80 mg at 02/19/20 2127  . dexamethasone (DECADRON) injection 10 mg  10 mg Intravenous Q12H Hosie Poisson, MD   10 mg at 02/19/20 2128  . enoxaparin (LOVENOX) injection 40 mg  40 mg Subcutaneous Q24H Hosie Poisson, MD   40 mg at 02/19/20 2128  . feeding supplement (ENSURE ENLIVE) (ENSURE ENLIVE) liquid 237 mL  237 mL Oral TID BM Domingo Cocking, Gene, MD   237 mL at 02/19/20 1142  . fentaNYL (SUBLIMAZE) 100 MCG/2ML injection           . fluticasone furoate-vilanterol (BREO ELLIPTA) 100-25 MCG/INH 1 puff  1 puff Inhalation Daily Kc, Ramesh, MD   1 puff at 02/20/20 0712   And  .  umeclidinium bromide (INCRUSE ELLIPTA) 62.5 MCG/INH 1 puff  1 puff Inhalation Daily Kc, Ramesh, MD   1 puff at 02/20/20 0712  . hydrALAZINE (APRESOLINE) tablet 25 mg  25 mg Oral Q8H PRN Antonieta Pert, MD   25 mg at 02/17/20 0559  . HYDROmorphone (DILAUDID) injection 0.5 mg  0.5 mg Intravenous Q4H PRN Domingo Cocking, Gene, MD      . iohexol (OMNIPAQUE) 300 MG/ML solution 50 mL  50 mL Other Once PRN Corrie Mckusick, DO      . lidocaine (PF) (XYLOCAINE) 1 % injection           . midazolam (VERSED) 2 MG/2ML injection           . ondansetron (ZOFRAN) tablet 4 mg  4 mg Oral Q6H PRN Kc, Ramesh, MD       Or  . ondansetron (ZOFRAN) injection 4 mg  4 mg Intravenous Q6H PRN Kc, Ramesh, MD   4 mg at 02/19/20 1631  . oxyCODONE (Oxy IR/ROXICODONE) immediate release tablet 5 mg  5 mg Oral Q3H PRN Loistine Chance, MD   5 mg at 02/19/20 0827  . senna-docusate (Senokot-S) tablet 1 tablet  1 tablet Oral QHS PRN Antonieta Pert, MD         Discharge Medications: Please see discharge summary for a list of discharge medications.  Relevant Imaging Results:  Relevant Lab Results:   Additional Information SS# [id number]  Lennart Pall, LCSW

## 2020-02-20 NOTE — Procedures (Signed)
Interventional Radiology Procedure Note  Procedure:   Treatment of symptomatic T11 pathologic fracture, with osteocool/RFA, and KP, via bipedicular approach.   Complications: None Recommendations:  - 3 hrs at least bedrest, may raise HOB as needed - advance diet ok, per primary order - Do not submerge for 7 days - Routine wound care   Signed,  Dulcy Fanny. Earleen Newport, DO

## 2020-02-20 NOTE — Progress Notes (Signed)
MEDICATION-RELATED CONSULT NOTE   IR Procedure Consult - Anticoagulant/Antiplatelet PTA/Inpatient Med List Review by Pharmacist    Procedure: kyphoplasty    Completed: 8/6 at 0954  Post-Procedural bleeding risk per IR MD assessment: LOW  Antithrombotic medications on inpatient or PTA profile prior to procedure:  Lovenox 40 mg SQ daily    Recommended restart time per IR Post-Procedure Guidelines:  At least 4 hr or next standard dosing interval   Other considerations:  Lovenox was never held, last dose last night, an next dose timed for 2200 tonight   Plan:     Continue Lovenox 40 mg SQ daily as ordered; next dose will be > 4h hr post-procedure  Reuel Boom, PharmD, BCPS [phone] 02/20/2020, 10:16 AM

## 2020-02-20 NOTE — Progress Notes (Signed)
PIV consult: pt in procedure. Please re-enter consult if needed upon return to unit.

## 2020-02-20 NOTE — Progress Notes (Signed)
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  In ED : SBP in 60-80, tachycardic with a lactic acid of 3.3, elevated Cr of 2.52, calcium 11.8 with normal albumin, dyspnea and a d dimer of > 20. Given 3 L ringer's lactate.      7/28 -MRI> 1. Abnormal marrow signal intensity throughout the thoracic spine, consistent with metastatic disease or myeloma. 2. Complete involvement of the T11 vertebral body with associated pathologic compression fracture, with 5 mm of retropulsed bone and/or tumor protruding into the spinal canal. Resultant mild spinal stenosis without frank cord compression or cord signal changes. 3. Additional prominent tumor involvement involving the right posterior elements of T5 with associated adjacent paraspinous edema.  VQ scan: : Diffusely heterogeneous lung perfusion without discrete wedge-shaped perfusion defect.  7/29 CT chest abdomen pelvis:  - 10 mm spiculated right upper lobe pulmonary nodule - 5 mm posterior right upper lobe pulmonary nodule. Metastatic disease not excluded. Close attention on follow-up recommended. - 1.8 cm suspected lesion in the dome of the lateral segment left liver, not well evaluated on this noncontrast exam metastatic disease a concern. -Metastatic disease identified in the manubrium and bulky soft tissue metastatic deposit involves the right T5 pedicle, transverse process and posterior right fifth rib.  Subjective: He states the Oxycodone is helping his back pain. He is considering going to SNF now. He will follow up with Dr Lorenso Courier as outpatient.     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 5 mm of retropulsed bone and/or tumor protrudes into the spinal canal  - s/p IR kypkoplasty today- doing well after procedure - he is unable to ambulate and has chosen to go to a SNF- he will d/c tomorrow if he remains stable  Hypercalcemia - likely due to dehydration- improved to normal  Anemia  - check anemia panel show a folic acid of 5.3, normal Iron saturation, elevated Ferritin (due to cancer) and B12 of 1218 - start folate replacment   HTN - cont Amlodipine and follow    COPD, group D, by GOLD 2017 classification    Chronic respiratory failure with hypoxia  - this is stable    Time spent in minutes: 35 DVT prophylaxis: Lovenox Code Status: DNR Family Communication:   Disposition Plan:  Status is: Inpatient  Remains inpatient appropriate because not safe d/c until SNF bed is found- s/p kyphoplasty today, follow  overnight and then dc to SNF tomorrow if stable  Dispo: The patient is from: Home              Anticipated d/c is to: SNF              Anticipated d/c date is: 1 day              Patient currently is not medically stable to d/c.      Consultants:   Palliative care  oncology Procedures:   Biopsy  of thoracic mass Antimicrobials:  Anti-infectives (From admission, onward)   Start     Dose/Rate Route Frequency Ordered Stop   02/20/20 0845  ceFAZolin (ANCEF) 2-4 GM/100ML-% IVPB       Note to Pharmacy: Hilma Favors   : cabinet override      02/20/20 0845 02/20/20 0855   02/20/20 0600  ceFAZolin (ANCEF) IVPB 2g/100 mL premix        2 g 200 mL/hr over 30 Minutes Intravenous To Radiology 02/19/20 1623 02/20/20 0630       Objective: Vitals:   02/20/20 0935 02/20/20 0940 02/20/20 0945 02/20/20 1013  BP: 132/89 139/86 (!) 135/96 (!) 142/93  Pulse: 94 96 97 80  Resp: 17 18 18 16   Temp:    98.1 F (36.7 C)  TempSrc:    Oral  SpO2: 100% 100% 100% 93%  Weight:      Height:        Intake/Output Summary (Last 24 hours) at 02/20/2020 1101 Last data filed at 02/20/2020 0824 Gross per 24 hour  Intake 480 ml  Output 600 ml  Net -120 ml   Filed Weights   02/11/20 1151  Weight: 78.5 kg    Examination: General exam: Appears comfortable  HEENT: PERRLA, oral mucosa moist, no sclera icterus or thrush Respiratory system: Clear to auscultation. Respiratory effort normal. Cardiovascular system: S1 & S2 heard,  No murmurs  Gastrointestinal system: Abdomen soft, non-tender, nondistended. Normal bowel sounds   Central nervous system: Alert and oriented. No focal neurological deficits. Extremities: No cyanosis, clubbing or edema Skin: No rashes or ulcers Psychiatry:  Mood & affect appropriate.     Data Reviewed: I have personally reviewed following labs and imaging studies  CBC: Recent Labs  Lab 02/16/20 0534 02/16/20 1928 02/20/20 0446  WBC 11.7* 11.5* 13.9*  HGB 11.8* 12.2* 11.2*  HCT 37.7* 39.5 36.2*  MCV 94.3 96.1 95.8  PLT 351 322 016   Basic Metabolic Panel: Recent Labs  Lab 02/16/20 0534 02/18/20 0538  NA 143  --   K 4.2  --   CL 106  --   CO2 26  --   GLUCOSE 117*  --   BUN 41*  --   CREATININE 1.00 0.94  CALCIUM 9.5  --    GFR: Estimated Creatinine  Clearance: 75.7 mL/min (by C-G formula based on SCr of 0.94 mg/dL). Liver Function Tests: Recent Labs  Lab 02/16/20 0534  AST 73*  ALT 48*  ALKPHOS 89  BILITOT 0.6  PROT 6.2*  ALBUMIN 3.1*   No results for input(s): LIPASE, AMYLASE in the last 168 hours. No results for input(s): AMMONIA in the last 168 hours. Coagulation Profile: Recent Labs  Lab 02/20/20 0446  INR 1.1   Cardiac Enzymes: No results for input(s): CKTOTAL, CKMB, CKMBINDEX, TROPONINI in the last 168 hours. BNP (last 3 results) No results for input(s): PROBNP in the last 8760 hours. HbA1C: No results for input(s): HGBA1C in the last  72 hours. CBG: No results for input(s): GLUCAP in the last 168 hours. Lipid Profile: No results for input(s): CHOL, HDL, LDLCALC, TRIG, CHOLHDL, LDLDIRECT in the last 72 hours. Thyroid Function Tests: No results for input(s): TSH, T4TOTAL, FREET4, T3FREE, THYROIDAB in the last 72 hours. Anemia Panel: Recent Labs    02/19/20 1343  VITAMINB12 1,218*  FOLATE 5.3*  FERRITIN 1,866*  TIBC 250  IRON 54  RETICCTPCT 1.5   Urine analysis:    Component Value Date/Time   COLORURINE STRAW (A) 02/11/2020 2030   APPEARANCEUR CLEAR 02/11/2020 2030   LABSPEC 1.012 02/11/2020 2030   PHURINE 5.0 02/11/2020 2030   GLUCOSEU NEGATIVE 02/11/2020 2030   [place] (A) 02/11/2020 2030   [place] NEGATIVE 02/11/2020 2030   [place] 02/11/2020 2030   PROTEINUR NEGATIVE 02/11/2020 2030   NITRITE NEGATIVE 02/11/2020 2030   LEUKOCYTESUR NEGATIVE 02/11/2020 2030   Sepsis Labs: @LABRCNTIP (procalcitonin:4,lacticidven:4) ) Recent Results (from the past 240 hour(s))  SARS Coronavirus 2 by RT PCR (hospital order, performed in [place] hospital lab) Nasopharyngeal Nasopharyngeal Swab     Status: None   Collection Time: 02/11/20  1:24 PM   Specimen: Nasopharyngeal Swab  Result Value Ref Range Status   SARS Coronavirus 2 NEGATIVE NEGATIVE Final    Comment: (NOTE) SARS-CoV-2  target nucleic acids are NOT DETECTED.  The SARS-CoV-2 RNA is generally detectable in upper and lower respiratory specimens during the acute phase of infection. The lowest concentration of SARS-CoV-2 viral copies this assay can detect is 250 copies / mL. A negative result does not preclude SARS-CoV-2 infection and should not be used as the sole basis for treatment or other patient management decisions.  A negative result may occur with improper specimen collection / handling, submission of specimen other than nasopharyngeal swab, presence of viral mutation(s) within the areas targeted by this assay, and inadequate number of viral copies (<250 copies / mL). A negative result must be combined with clinical observations, patient history, and epidemiological information.  Fact Sheet for Patients:   StrictlyIdeas.no  Fact Sheet for Healthcare Providers: BankingDealers.co.za  This test is not yet approved or  cleared by the [place] FDA and has been authorized for detection and/or diagnosis of SARS-CoV-2 by FDA under an Emergency Use Authorization (EUA).  This EUA will remain in effect (meaning this test can be used) for the duration of the COVID-19 declaration under Section 564(b)(1) of the Act, 21 U.S.C. section 360bbb-3(b)(1), unless the authorization is terminated or revoked sooner.  Performed at [facility], [place] [address], [place], [place] [address]   Urine culture     Status: Abnormal   Collection Time: 02/11/20  8:30 PM   Specimen: Urine, Clean Catch  Result Value Ref Range Status   Specimen Description   Final    URINE, CLEAN CATCH Performed at [facility], [place] [address], [place], [place] [address]    Special Requests   Final    NONE Performed at [facility], [place] [address], [place], [place] [address]    Culture (A)  Final    <10,000 COLONIES/mL INSIGNIFICANT  GROWTH Performed at [facility] [address] [place], [place] [address]    Report Status 02/12/2020 FINAL  Final      Radiology Studies: MR BRAIN W WO CONTRAST  Result Date: 02/19/2020 CLINICAL DATA:  Non-small cell lung cancer, staging. EXAM: MRI HEAD WITHOUT AND WITH CONTRAST TECHNIQUE: Multiplanar, multiecho pulse sequences of the brain and  surrounding structures were obtained without and with intravenous contrast. CONTRAST:  57mL GADAVIST GADOBUTROL 1 MMOL/ML IV SOLN COMPARISON:  Head CT 09/17/2017. FINDINGS: Brain: Stable, moderate generalized parenchymal atrophy. No abnormal enhancement is demonstrated to suggest intracranial metastatic disease. There is a small chronic cortically based infarct within the right occipital lobe. Mild scattered T2/FLAIR hyperintensity within the cerebral white matter and pons is nonspecific, but consistent with chronic small vessel ischemic disease. There is no acute infarct. No evidence of intracranial mass. No chronic intracranial blood products. No extra-axial fluid collection. No midline shift. Vascular: Expected proximal arterial flow voids. Skull and upper cervical spine: No focal marrow lesion. Sinuses/Orbits: Visualized orbits show no acute finding. Tiny right maxillary sinus mucous retention cysts. No significant mastoid effusion. IMPRESSION: No evidence of intracranial metastatic disease. 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 Moderate generalized parenchymal atrophy with mild chronic small vessel ischemic disease. Tiny right maxillary sinus mucous retention cysts. Electronically Signed   By: Kellie Simmering DO   On: 02/19/2020 11:48   IR Bone Tumor(s)RF Ablation  Result Date: 02/20/2020 INDICATION: 74 year old male with a history of metastatic adenocarcinoma, symptomatic pathologic fracture of T11, presents 4 radiofrequency ablation and vertebral augmentation EXAM: IMAGE GUIDED RADIOFREQUENCY ABLATION OF T11 PATHOLOGIC FRACTURE  BIPEDICULAR APPROACH KYPHOPLASTY T11 MEDICATIONS: As antibiotic prophylaxis, 2 G ANCEF was ordered pre-procedure and administered intravenously within 1 hour of incision. ANESTHESIA/SEDATION: Moderate (conscious) sedation was employed during this procedure. A total of Versed 3.0 mg and Fentanyl 200 mcg was administered intravenously. Moderate Sedation Time: 47 minutes. The patient's level of consciousness and vital signs were monitored continuously by radiology nursing throughout the procedure under my direct supervision. FLUOROSCOPY TIME:  Fluoroscopy Time: 6 minutes 30 seconds (253 mGy) COMPLICATIONS: NONE PROCEDURE: Following a full explanation of the procedure along with the potentially associated complications, a witnessed informed consent was obtained. Specific risks that were discussed included bleeding, infection, injury to adjacent structures, neurologic injury, embolization of cement within the veins, failure of the procedure to improve pain, need for further procedure/ surgery, cardiopulmonary collapse, death. The patient understands the risks and wishes to proceed. The patient was placed prone on the fluoroscopic table. Nasal oxygen was administered. Physiologic monitoring was performed throughout the duration of the procedure. The skin overlying the T11 region was prepped and draped in the usual sterile fashion. The T11 vertebral body was identified and the RIGHT pedicle was infiltrated with 1% lidocaine. This was then followed by the advancement of Medtronic Osteocool Trocar needle through the RIGHT pedicle into the posterior one-third. The left pedicle was then infiltrated with 1% lidocaine. A small incision was made with 11 blade scalpel, and then a Medtronic Osteocool Trocar needle was advanced through the left pedicle into the posterior 1/3 of the vertebral body. The bone drill/measuring tool was then advanced through the bilateral pedicles to estimate length of the required Osteocool ablation  device. 15 mm device selected both pedicle. Once position was confirmed, we initiated the treatment. Upon the first initiation, the left-sided pedicle RF antenna demonstrated a malfunction. After troubleshooting with the Medtronic representative, we replaced the left probe. Successful initiation was then achieved, and ablation was performed for total of 11 minutes 30 second, bilateral. The RFA ablation devices were then removed. Balloon cannula were advanced bilaterally. Simultaneous inflation of the left and right pedicular cannula balloons was performed under fluoroscopic observation. Methylmethacrylate mixture was then reconstituted. Under biplane intermittent fluoroscopy, the methylmethacrylate was then injected into both the cavity and the interstices of  vertebral body with filling of the space. No extravasation was noted posteriorly into the spinal canal. No epidural venous contamination was seen. Trace contamination of the T10-T11 disc space. The needles were then removed. Hemostasis was achieved at the skin entry site. There were no acute complications. Patient tolerated the procedure well. The patient was observed for 30 minutes and returned to room in good condition. IMPRESSION: Status post image guided T11 RF ablation and bipedicular approach kyphoplasty for symptomatic pathologic fracture. Signed, Dulcy Fanny. Dellia Nims, RPVI [facility] Radiology Electronically Signed   By: Corrie Mckusick D.O.   On: 02/20/2020 10:16   IR KYPHO EA ADDL LEVEL THORACIC OR LUMBAR  Result Date: 02/20/2020 INDICATION: 74 year old male with a history of metastatic adenocarcinoma, symptomatic pathologic fracture of T11, presents 4 radiofrequency ablation and vertebral augmentation EXAM: IMAGE GUIDED RADIOFREQUENCY ABLATION OF T11 PATHOLOGIC FRACTURE BIPEDICULAR APPROACH KYPHOPLASTY T11 MEDICATIONS: As antibiotic prophylaxis, 2 G ANCEF was ordered pre-procedure and administered  intravenously within 1 hour of incision. ANESTHESIA/SEDATION: Moderate (conscious) sedation was employed during this procedure. A total of Versed 3.0 mg and Fentanyl 200 mcg was administered intravenously. Moderate Sedation Time: 47 minutes. The patient's level of consciousness and vital signs were monitored continuously by radiology nursing throughout the procedure under my direct supervision. FLUOROSCOPY TIME:  Fluoroscopy Time: 6 minutes 30 seconds (831 mGy) COMPLICATIONS: NONE PROCEDURE: Following a full explanation of the procedure along with the potentially associated complications, a witnessed informed consent was obtained. Specific risks that were discussed included bleeding, infection, injury to adjacent structures, neurologic injury, embolization of cement within the veins, failure of the procedure to improve pain, need for further procedure/ surgery, cardiopulmonary collapse, death. The patient understands the risks and wishes to proceed. The patient was placed prone on the fluoroscopic table. Nasal oxygen was administered. Physiologic monitoring was performed throughout the duration of the procedure. The skin overlying the T11 region was prepped and draped in the usual sterile fashion. The T11 vertebral body was identified and the RIGHT pedicle was infiltrated with 1% lidocaine. This was then followed by the advancement of Medtronic Osteocool Trocar needle through the RIGHT pedicle into the posterior one-third. The left pedicle was then infiltrated with 1% lidocaine. A small incision was made with 11 blade scalpel, and then a Medtronic Osteocool Trocar needle was advanced through the left pedicle into the posterior 1/3 of the vertebral body. The bone drill/measuring tool was then advanced through the bilateral pedicles to estimate length of the required Osteocool ablation device. 15 mm device selected both pedicle. Once position was confirmed, we initiated the treatment. Upon the first initiation, the  left-sided pedicle RF antenna demonstrated a malfunction. After troubleshooting with the Medtronic representative, we replaced the left probe. Successful initiation was then achieved, and ablation was performed for total of 11 minutes 30 second, bilateral. The RFA ablation devices were then removed. Balloon cannula were advanced bilaterally. Simultaneous inflation of the left and right pedicular cannula balloons was performed under fluoroscopic observation. Methylmethacrylate mixture was then reconstituted. Under biplane intermittent fluoroscopy, the methylmethacrylate was then injected into both the cavity and the interstices of vertebral body with filling of the space. No extravasation was noted posteriorly into the spinal canal. No epidural venous contamination was seen. Trace contamination of the T10-T11 disc space. The needles were then removed. Hemostasis was achieved at the skin entry site. There were no acute complications. Patient tolerated the procedure well. The patient was observed for 30 minutes and returned to room  in good condition. IMPRESSION: Status post image guided T11 RF ablation and bipedicular approach kyphoplasty for symptomatic pathologic fracture. Signed, Dulcy Fanny. Dellia Nims, RPVI [facility] Radiology Electronically Signed   By: Corrie Mckusick D.O.   On: 02/20/2020 10:16      Scheduled Meds: . amLODipine  10 mg Oral Daily  . atorvastatin  80 mg Oral q1800  . dexamethasone (DECADRON) injection  10 mg Intravenous Q12H  . enoxaparin (LOVENOX) injection  40 mg Subcutaneous Q24H  . feeding supplement (ENSURE ENLIVE)  237 mL Oral TID BM  . fentaNYL      . fluticasone furoate-vilanterol  1 puff Inhalation Daily   And  . umeclidinium bromide  1 puff Inhalation Daily  . lidocaine (PF)      . midazolam       Continuous Infusions:   LOS: 9 days      Debbe Odea, MD [facility] Pager: [email or web address] 02/20/2020, 11:01 AM

## 2020-02-20 NOTE — Progress Notes (Signed)
PT Cancellation Note  Patient Details Name: George Frazier MRN: [id number] DOB: March 18, 1946   Cancelled Treatment:      PT deferred this date - pt to IR for Kyphoplasty per nursing.  Will follow.  Alando Colleran 02/20/2020, 8:17 AM

## 2020-02-21 DIAGNOSIS — S22088A Other fracture of T11-T12 vertebra, initial encounter for closed fracture: Secondary | ICD-10-CM

## 2020-02-21 MED ORDER — DEXAMETHASONE 4 MG PO TABS
ORAL_TABLET | ORAL | Status: AC
Start: 2020-02-21 — End: ?

## 2020-02-21 MED ORDER — OXYCODONE HCL 5 MG PO TABS: 5.0000 mg | ORAL_TABLET | Freq: Four times a day (QID) | ORAL | 0 refills | Status: AC | PRN

## 2020-02-21 MED ORDER — ENSURE ENLIVE PO LIQD: 237.0000 mL | Freq: Three times a day (TID) | ORAL | 12 refills | Status: AC

## 2020-02-21 MED ORDER — ACETAMINOPHEN 325 MG PO TABS: 650.0000 mg | ORAL_TABLET | Freq: Four times a day (QID) | ORAL | Status: AC | PRN

## 2020-02-21 MED ORDER — AMLODIPINE BESYLATE 10 MG PO TABS: 10.0000 mg | ORAL_TABLET | Freq: Every day | ORAL | Status: AC

## 2020-02-21 MED ORDER — IBUPROFEN 200 MG PO TABS
400.0000 mg | ORAL_TABLET | Freq: Four times a day (QID) | ORAL | 2 refills | Status: DC
Start: 2020-02-21 — End: 2020-02-21

## 2020-02-21 MED ORDER — SENNOSIDES-DOCUSATE SODIUM 8.6-50 MG PO TABS: 1.0000 | ORAL_TABLET | Freq: Every evening | ORAL | Status: AC | PRN

## 2020-02-21 NOTE — Progress Notes (Signed)
Physical Therapy Treatment Patient Details Name: George Frazier MRN: [id number] DOB: 1946-05-04 Today's Date: 02/21/2020    History of Present Illness 74 yo male admitted with hypotension, back pain, weakness. Imaging showed metastases to spine and T11 compression fx s/p KP 02/20/20. Lung likely primary source.  Hx of COPD-O2 dep, CHF, AAA, medical noncompliance    PT Comments    Pt agreeable to attempt mobility however only able to sit EOB briefly.  Pt reports fatigue is limiting him at this time.  Continue to recommend SNF upon d/c.    Follow Up Recommendations  SNF     Equipment Recommendations  None recommended by PT    Recommendations for Other Services       Precautions / Restrictions Precautions Precautions: Fall;Back Precaution Comments: reviewed log roll technique and back precautions    Mobility  Bed Mobility Overal bed mobility: Needs Assistance Bed Mobility: Rolling;Sidelying to Sit;Sit to Sidelying Rolling: Min assist Sidelying to sit: Min assist     Sit to sidelying: Min assist General bed mobility comments: VCs for log roll technique. pt provided with min assist for manual cues, pt sat EOB for a few minutes and felt unable to perform further activity due to fatigue.  Transfers                    Ambulation/Gait                 Stairs             Wheelchair Mobility    Modified Rankin (Stroke Patients Only)       Balance                                            Cognition Arousal/Alertness: Awake/alert Behavior During Therapy: WFL for tasks assessed/performed Overall Cognitive Status: Within Functional Limits for tasks assessed                                        Exercises      General Comments        Pertinent Vitals/Pain Pain Assessment: 0-10 Pain Score: 5  Pain Location: back Pain Descriptors / Indicators: Discomfort;Sore;Aching Pain Intervention(s):  Repositioned;Monitored during session    Home Living                      Prior Function            PT Goals (current goals can now be found in the care plan section) Progress towards PT goals: Progressing toward goals    Frequency    Min 2X/week      PT Plan Current plan remains appropriate    Co-evaluation              AM-PAC PT "6 Clicks" Mobility   Outcome Measure  Help needed turning from your back to your side while in a flat bed without using bedrails?: A Little Help needed moving from lying on your back to sitting on the side of a flat bed without using bedrails?: A Little Help needed moving to and from a bed to a chair (including a wheelchair)?: A Lot Help needed standing up from a chair using your arms (e.g., wheelchair or bedside chair)?: A Lot Help needed  to walk in hospital room?: A Lot Help needed climbing 3-5 steps with a railing? : Total 6 Click Score: 13    End of Session Equipment Utilized During Treatment: Gait belt;Oxygen Activity Tolerance: Patient limited by fatigue Patient left: with call bell/phone within reach;in bed;with bed alarm set Nurse Communication: Mobility status PT Visit Diagnosis: Muscle weakness (generalized) (M62.81);Difficulty in walking, not elsewhere classified (R26.2)     Time: 1049-1101 PT Time Calculation (min) (ACUTE ONLY): 12 min  Charges:  $Therapeutic Activity: 8-22 mins                    Jannette Spanner PT, DPT Acute Rehabilitation Services Pager: [phone] Office: [phone]   York Ram E 02/21/2020, 12:22 PM

## 2020-02-21 NOTE — TOC Progression Note (Addendum)
Transition of Care (TOC) - Progression Note    Patient Details  Name: George Frazier MRN: [id number] Date of Birth: 12/02/1945  Transition of Care (TOC) CM/SW Contact  Rodney B North, LCSW Phone Number: 02/21/2020, 1:49 PM  Clinical Narrative:  Received message that patient is stable medically and ready for d/c.  Reviewed bed offers.  None.  Went to see patient, who is clear that he feels weak, is unable to ambulate, and needs help from facility or family.  He knows that daughter has been talking to family about putting together a plan, but that is the extent of his knowledge.  We attempted to call daughter together.  Left v/m message. Informed RN of status. TOC will continue to follow during the course of hospitalization.  Met with daughter at bedside of patient.  We spoke of options.  Patient wanted to know what would be accomplished by going to SNF.  Maintaining upper body strength and nursing care was my response.  He thought for a moment, then said he preferred to go home if he would not get significantly better.  He then wondered about pain management, which I explained would be addressed by PCP.  Because of further questions about this, I explained hospice care, and he stated he would like that so he can be assured of being comfortable.  He and daughter have no preference of providers.  Will call to arrange hospice, and ask them to talk to daughter about DME needs.  Left message for [facility].  Left another message.  Addendum:  Received call back.  [facility] will service-plan to call family later this PM or first thing in AM.      Expected Discharge Plan: Skilled Nursing Facility Barriers to Discharge: Continued Medical Work up  Expected Discharge Plan and Services Expected Discharge Plan: Skilled Nursing Facility In-house Referral: Clinical Social Work     Living arrangements for the past 2 months: Single Family Home Expected Discharge Date: 02/21/20                                      Social Determinants of Health (SDOH) Interventions    Readmission Risk Interventions No flowsheet data found.  

## 2020-02-21 NOTE — Discharge Summary (Signed)
Physician Discharge Summary  George Frazier OMB:[id number] DOB: 1945-10-08 DOA: 02/11/2020  PCP: Patient, No Pcp Per  Admit date: 02/11/2020 Discharge date: 02/21/2020  Admitted From: home Disposition:  SNF   Recommendations for Outpatient Follow-up:  1. F/u on pain control    Discharge Condition:  stable   CODE STATUS:  DNR   Diet recommendation:  Heart healthy Consultations:  Oncology  IR  Palliative care  Procedures/Studies: . kyphoplasty 8/6   Discharge Diagnoses:  Principal Problem:   AKI (acute kidney injury) -  Dehydration Active Problems:   Malignant neoplasm metastatic to bone    COPD, group D, by GOLD 2017 classification ([place])   Chronic respiratory failure with hypoxia (HCC)   Hypertension   Hyperlipidemia   Solitary pulmonary nodule- R middle lobe- followed by pulm  Brief Summary:  George Frazier COPD Gold stage D on 2 L home oxygen, lung nodule, hypertension, chronic diastolic heart failure presents to ED for generalized weakness and decreased appetite x 2 wks and new back pain.  In ED : SBP in 60-80, tachycardic with a lactic acid of 3.3, elevated Cr of 2.52, calcium 11.8 with normal albumin, dyspnea and a d dimer of > 20. Given 3 L ringer's lactate.      7/28 -MRI> 1. Abnormal marrow signal intensity throughout the thoracic spine, consistent with metastatic disease or myeloma. 2. Complete involvement of the T11 vertebral body with associated pathologic compression fracture, with 5 mm of retropulsed bone and/or tumor protruding into the spinal canal. Resultant mild spinal stenosis without frank cord compression or cord signal changes. 3. Additional prominent tumor involvement involving the right posterior elements of T5 with associated adjacent paraspinous edema.  VQ scan: : Diffusely heterogeneous lung perfusion without discrete wedge-shaped perfusion defect.  7/29 CT chest abdomen pelvis:  - 10 mm spiculated right upper lobe pulmonary  nodule - 5 mm posterior right upper lobe pulmonary nodule. Metastatic disease not excluded. Close attention on follow-up recommended. - 1.8 cm suspected lesion in the dome of the lateral segment left liver, not well evaluated on this noncontrast exam metastatic disease a concern. -Metastatic disease identified in the manubrium and bulky soft tissue metastatic deposit involves the right T5 pedicle, transverse process and posterior right fifth rib.    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 5 mm of retropulsed bone and/or tumor protrudes into the spinal canal  - s/p IR kypkoplasty 8/6- doing well after procedure - Tylenol, Decadron and Oxycodone being used for pain control - the patient plans to follow with oncology to obtain final biopsy results and treatment options - he is unable to ambulate, lives alone and has chosen to go  to a SNF   Hypercalcemia - likely due to dehydration- improved to normal  Anemia  - check anemia panel show a folic acid of 5.3, normal Iron saturation, elevated Ferritin (due to cancer) and B12 of 1218 - started folate  replacment   HTN - cont Amlodipine and follow    COPD, group D, by GOLD 2017 classification    Chronic respiratory failure with hypoxia  - this is stable   Discharge Exam: Vitals:   02/21/20 0756 02/21/20 0959  BP:  108/76  Pulse:  84  Resp:  18  Temp:  97.6 F (36.4 C)  SpO2: 98% 100%   Vitals:   02/21/20 0117 02/21/20 0524 02/21/20 0756 02/21/20 0959  BP: 120/73 127/85  108/76  Pulse: 78 81  84  Resp: 15 16  18   Temp: (!) 97.4 F (36.3 C) 98 F (36.7 C)  97.6 F (36.4 C)  TempSrc:    Oral  SpO2: 99% 98% 98% 100%  Weight:      Height:        General: Pt is alert, awake, not in acute distress Cardiovascular: RRR, S1/S2 +, no rubs, no gallops Respiratory: CTA bilaterally, no wheezing, no rhonchi Abdominal: Soft, NT, ND, bowel sounds + Extremities: no edema, no cyanosis   Discharge Instructions  Discharge Instructions    Diet - low sodium heart healthy   Complete by: As directed    Increase activity slowly   Complete by: As directed    No wound care   Complete by: As directed      Allergies as of 02/21/2020   No Known Allergies     Medication List    STOP taking these medications   furosemide 20 MG tablet Commonly known as: LASIX   Olmesartan-amLODIPine-HCTZ 20-5-12.5 MG Tabs     TAKE these medications   acetaminophen 325 MG tablet Commonly known as: TYLENOL Take 2 tablets (650 mg total) by mouth every 6 (six) hours as needed for mild pain (or Fever >/= 101).   albuterol 108 (90 Base) MCG/ACT inhaler Commonly known as: VENTOLIN HFA Inhale 2 puffs into the lungs every 4 (four) hours as needed for wheezing or shortness of breath. What changed: Another medication with the same name was changed. Make sure you understand how and when to take each.   albuterol (2.5 MG/3ML) 0.083% nebulizer solution Commonly known as: PROVENTIL Take 3 mLs (2.5 mg total) by nebulization 3 (three) times daily. What changed:   when to take this  reasons to take  this   amLODipine 10 MG tablet Commonly known as: NORVASC Take 1 tablet (10 mg total) by mouth daily. Start taking on: February 22, 2020   atorvastatin 80 MG tablet Commonly known as: LIPITOR Take 1 tablet (80 mg total) by mouth daily at 6 PM.   dexamethasone 4 MG tablet Commonly known as: DECADRON Take 4 mg BID x 2 days, then 4 mg daily x 2 days, then 2 mg daily x 2 days,  then 1 mg daily x 2 days,  then 0.5 mg daily x 2 days then stop   feeding supplement (ENSURE ENLIVE) Liqd Take 237 mLs by mouth 3 (three) times daily between meals.   metoprolol succinate 50 MG 24 hr tablet Commonly known as: TOPROL-XL Take 1 tablet (50 mg total) by mouth daily.   oxyCODONE 5 MG immediate release tablet Commonly known as: Roxicodone Take 1 tablet (5 mg total) by mouth every 6 (six) hours as needed for severe pain.  senna-docusate 8.6-50 MG tablet Commonly known as: Senokot-S Take 1 tablet by mouth at bedtime as needed for mild constipation.   Trelegy Ellipta 100-62.5-25 MCG/INH Aepb Generic drug: Fluticasone-Umeclidin-Vilant INHALE 1 PUFF INTO THE LUNGS DAILY. NEED APPOINTMENT FOR FURTHER REFILLS What changed: See the new instructions.       No Known Allergies    CT ABDOMEN PELVIS WO CONTRAST  Result Date: 02/13/2020 CLINICAL DATA:  Metastatic disease of unknown primary. EXAM: CT CHEST, ABDOMEN AND PELVIS WITHOUT CONTRAST TECHNIQUE: Multidetector CT imaging of the chest, abdomen and pelvis was performed following the standard protocol without IV contrast. COMPARISON:  CT chest 02/19/2019. FINDINGS: CT CHEST FINDINGS Cardiovascular: The heart size is normal. No substantial pericardial effusion. Coronary artery calcification is evident. Atherosclerotic calcification is noted in the wall of the thoracic aorta. Mediastinum/Nodes: No mediastinal lymphadenopathy. No evidence for gross hilar lymphadenopathy although assessment is limited by the lack of intravenous contrast on today's study.  The esophagus has normal imaging features. There is no axillary lymphadenopathy. Lungs/Pleura: Centrilobular and paraseptal emphysema evident. Bullous change noted right lung apex with biapical pleuroparenchymal scarring. 10 mm spiculated right upper lobe pulmonary nodule identified on image 43/series 4. 5 mm posterior right upper lobe nodule evident on 58/4. Scattered areas of architectural distortion and parenchymal scarring noted. Circumferential bronchial wall thickening evident bilaterally with substantial peripheral airway impaction in the posterior right lower lobe. Atelectasis noted posterior costophrenic sulci. Musculoskeletal: Lytic lesions are identified in the manubrium measuring up to 13 mm (sagittal image 96/series 6). T11 compression fracture evident. 4.4 x 3.4 cm soft tissue mass destroys the right T5 pedicle and transverse process with involvement of the posterior right fifth rib. CT ABDOMEN PELVIS FINDINGS Hepatobiliary: Not well demonstrated on this noncontrast study, suspicion for a 1.8 cm lesion in the dome of the lateral segment left liver (narrow windows on axial 56/2). Small area of low attenuation in the anterior liver, adjacent to the falciform ligament, is in a characteristic location for focal fatty deposition. There is no evidence for gallstones, gallbladder wall thickening, or pericholecystic fluid. No intrahepatic or extrahepatic biliary dilation. Pancreas: No focal mass lesion. No dilatation of the main duct. No intraparenchymal cyst. No peripancreatic edema. Spleen: No splenomegaly. No focal mass lesion. Adrenals/Urinary Tract: No adrenal nodule or mass. Left kidney has unremarkable noncontrast appearance. Cystic lesion in the upper pole right kidney likely focally dilated upper pole calyx although central sinus cysts could have this appearance. No evidence for hydroureter. The urinary bladder appears normal for the degree of distention. Stomach/Bowel: Stomach is unremarkable. No  gastric wall thickening. No evidence of outlet obstruction. Duodenum is normally positioned as is the ligament of Treitz. No small bowel wall thickening. No small bowel dilatation. The terminal ileum is normal. The appendix is normal. No gross colonic mass. No colonic wall thickening. Diverticular changes are noted in the left colon without evidence of diverticulitis. Vascular/Lymphatic: Saccular aneurysm identified infrarenal abdominal aorta measuring up to 3.2 cm in maximum diameter. There is no gastrohepatic or hepatoduodenal ligament lymphadenopathy. No retroperitoneal or mesenteric lymphadenopathy. No pelvic sidewall lymphadenopathy. Reproductive: Prostate gland is enlarged. Other: No intraperitoneal free fluid. Musculoskeletal: Right groin hernia contains distal ileal loops without obstruction or complicating features. Destructive tumor identified at S1, better characterized on recent MRI. Additional bony metastatic disease also better assessed at MRI. IMPRESSION: 1. 10 mm spiculated right upper lobe pulmonary nodule. Primary bronchogenic neoplasm versus metastatic deposit. 2. 5 mm posterior right upper lobe pulmonary nodule. Metastatic disease not excluded. Close  attention on follow-up recommended. 3. Circumferential bronchial wall thickening with substantial peripheral airway impaction in the posterior right lower lobe. Imaging features may be related to atypical infection although aspiration could have this appearance in the appropriate clinical setting. 4. 1.8 cm suspected lesion in the dome of the lateral segment left liver, not well evaluated on this noncontrast exam metastatic disease a concern. 5. Metastatic disease identified in the manubrium and bulky soft tissue metastatic deposit involves the right T5 pedicle, transverse process and posterior right fifth rib. 6. 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 7. Right groin hernia contains distal ileal loops without  obstruction or complicating features. 8. Saccular infrarenal abdominal aortic aneurysm measuring up to 3.2 cm in maximum diameter. Attention on follow-up recommended. 9. Prostatomegaly. 10. Aortic Atherosclerosis (ICD10-I70.0) and Emphysema (ICD10-J43.9). Electronically Signed   By: Misty Stanley M.D.   On: 02/13/2020 08:11   DG Chest 2 View  Result Date: 02/11/2020 CLINICAL DATA:  Shortness of breath. EXAM: CHEST - 2 VIEW COMPARISON:  August 08, 2018. FINDINGS: The heart size and mediastinal contours are within normal limits. Both lungs are clear. No pneumothorax or pleural effusion is noted. The visualized skeletal structures are unremarkable. IMPRESSION: No active cardiopulmonary disease. Aortic Atherosclerosis (ICD10-I70.0). Electronically Signed   By: Marijo Conception M.D.   On: 02/11/2020 12:24   CT CHEST WO CONTRAST  Result Date: 02/13/2020 CLINICAL DATA:  Metastatic disease of unknown primary. EXAM: CT CHEST, ABDOMEN AND PELVIS WITHOUT CONTRAST TECHNIQUE: Multidetector CT imaging of the chest, abdomen and pelvis was performed following the standard protocol without IV contrast. COMPARISON:  CT chest 02/19/2019. FINDINGS: CT CHEST FINDINGS Cardiovascular: The heart size is normal. No substantial pericardial effusion. Coronary artery calcification is evident. Atherosclerotic calcification is noted in the wall of the thoracic aorta. Mediastinum/Nodes: No mediastinal lymphadenopathy. No evidence for gross hilar lymphadenopathy although assessment is limited by the lack of intravenous contrast on today's study. The esophagus has normal imaging features. There is no axillary lymphadenopathy. Lungs/Pleura: Centrilobular and paraseptal emphysema evident. Bullous change noted right lung apex with biapical pleuroparenchymal scarring. 10 mm spiculated right upper lobe pulmonary nodule identified on image 43/series 4. 5 mm posterior right upper lobe nodule evident on 58/4. Scattered areas of architectural  distortion and parenchymal scarring noted. Circumferential bronchial wall thickening evident bilaterally with substantial peripheral airway impaction in the posterior right lower lobe. Atelectasis noted posterior costophrenic sulci. Musculoskeletal: Lytic lesions are identified in the manubrium measuring up to 13 mm (sagittal image 96/series 6). T11 compression fracture evident. 4.4 x 3.4 cm soft tissue mass destroys the right T5 pedicle and transverse process with involvement of the posterior right fifth rib. CT ABDOMEN PELVIS FINDINGS Hepatobiliary: Not well demonstrated on this noncontrast study, suspicion for a 1.8 cm lesion in the dome of the lateral segment left liver (narrow windows on axial 56/2). Small area of low attenuation in the anterior liver, adjacent to the falciform ligament, is in a characteristic location for focal fatty deposition. There is no evidence for gallstones, gallbladder wall thickening, or pericholecystic fluid. No intrahepatic or extrahepatic biliary dilation. Pancreas: No focal mass lesion. No dilatation of the main duct. No intraparenchymal cyst. No peripancreatic edema. Spleen: No splenomegaly. No focal mass lesion. Adrenals/Urinary Tract: No adrenal nodule or mass. Left kidney has unremarkable noncontrast appearance. Cystic lesion in the upper pole right kidney likely focally dilated upper pole calyx although central sinus cysts could have this appearance. No evidence for hydroureter. The urinary  bladder appears normal for the degree of distention. Stomach/Bowel: Stomach is unremarkable. No gastric wall thickening. No evidence of outlet obstruction. Duodenum is normally positioned as is the ligament of Treitz. No small bowel wall thickening. No small bowel dilatation. The terminal ileum is normal. The appendix is normal. No gross colonic mass. No colonic wall thickening. Diverticular changes are noted in the left colon without evidence of diverticulitis. Vascular/Lymphatic:  Saccular aneurysm identified infrarenal abdominal aorta measuring up to 3.2 cm in maximum diameter. There is no gastrohepatic or hepatoduodenal ligament lymphadenopathy. No retroperitoneal or mesenteric lymphadenopathy. No pelvic sidewall lymphadenopathy. Reproductive: Prostate gland is enlarged. Other: No intraperitoneal free fluid. Musculoskeletal: Right groin hernia contains distal ileal loops without obstruction or complicating features. Destructive tumor identified at S1, better characterized on recent MRI. Additional bony metastatic disease also better assessed at MRI. IMPRESSION: 1. 10 mm spiculated right upper lobe pulmonary nodule. Primary bronchogenic neoplasm versus metastatic deposit. 2. 5 mm posterior right upper lobe pulmonary nodule. Metastatic disease not excluded. Close attention on follow-up recommended. 3. Circumferential bronchial wall thickening with substantial peripheral airway impaction in the posterior right lower lobe. Imaging features may be related to atypical infection although aspiration could have this appearance in the appropriate clinical setting. 4. 1.8 cm suspected lesion in the dome of the lateral segment left liver, not well evaluated on this noncontrast exam metastatic disease a concern. 5. Metastatic disease identified in the manubrium and bulky soft tissue metastatic deposit involves the right T5 pedicle, transverse process and posterior right fifth rib. 6. 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 7. Right groin hernia contains distal ileal loops without obstruction or complicating features. 8. Saccular infrarenal abdominal aortic aneurysm measuring up to 3.2 cm in maximum diameter. Attention on follow-up recommended. 9. Prostatomegaly. 10. Aortic Atherosclerosis (ICD10-I70.0) and Emphysema (ICD10-J43.9). Electronically Signed   By: Misty Stanley M.D.   On: 02/13/2020 08:11   MR BRAIN W WO CONTRAST  Result Date: 02/19/2020 CLINICAL  DATA:  Non-small cell lung cancer, staging. EXAM: MRI HEAD WITHOUT AND WITH CONTRAST TECHNIQUE: Multiplanar, multiecho pulse sequences of the brain and surrounding structures were obtained without and with intravenous contrast. CONTRAST:  94mL GADAVIST GADOBUTROL 1 MMOL/ML IV SOLN COMPARISON:  Head CT 09/17/2017. FINDINGS: Brain: Stable, moderate generalized parenchymal atrophy. No abnormal enhancement is demonstrated to suggest intracranial metastatic disease. There is a small chronic cortically based infarct within the right occipital lobe. Mild scattered T2/FLAIR hyperintensity within the cerebral white matter and pons is nonspecific, but consistent with chronic small vessel ischemic disease. There is no acute infarct. No evidence of intracranial mass. No chronic intracranial blood products. No extra-axial fluid collection. No midline shift. Vascular: Expected proximal arterial flow voids. Skull and upper cervical spine: No focal marrow lesion. Sinuses/Orbits: Visualized orbits show no acute finding. Tiny right maxillary sinus mucous retention cysts. No significant mastoid effusion. IMPRESSION: No evidence of intracranial metastatic disease. 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 Moderate generalized parenchymal atrophy with mild chronic small vessel ischemic disease. Tiny right maxillary sinus mucous retention cysts. Electronically Signed   By: Kellie Simmering DO   On: 02/19/2020 11:48   MR THORACIC SPINE WO CONTRAST  Result Date: 02/12/2020 CLINICAL DATA:  Initial evaluation for mid back pain. EXAM: MRI THORACIC SPINE WITHOUT CONTRAST TECHNIQUE: Multiplanar, multisequence MR imaging of the thoracic spine was performed. No intravenous contrast was administered. COMPARISON:  Prior MRI of the lumbar spine from 02/11/2020. FINDINGS: Alignment: Exaggeration of the normal thoracic  kyphosis with underlying mild dextroscoliosis. No listhesis. Vertebrae: Abnormal marrow signal intensity consistent  with metastatic disease or possibly myeloma seen throughout the thoracic spine, with involvement of nearly all levels. Involvement most pronounced within the mid and lower thoracic spine extending from T7 inferiorly. Complete involvement of the T11 vertebral body with associated pathologic compression fracture, with height loss measuring up to 75%. 5 mm of retropulsed bone and/or tumor protrudes into the spinal canal. Resultant mild spinal stenosis with minimal flattening of the ventral spinal cord, but no frank cord compression or cord signal changes. Probable additional compression fracture noted extending through the superior endplate of L1, partially visualized, better evaluated on previous lumbar MRI. No other pathologic fracture identified. Prominent tumor involvement of the right posterior elements noted at T5 (series 25, image 8). No other significant extra osseous or epidural tumor identified. Probable scattered rib involvement noted as well. Cord: Signal intensity within the thoracic spinal cord is within normal limits. Paraspinal and other soft tissues: Soft tissue edema noted within the right upper posterior back at the level of T4-5 related to prominent tumor burden involving the adjacent right T5 posterior elements (series 21, image 5). Paraspinous soft tissues demonstrate no other acute finding. Trace layering bilateral pleural effusions noted. Disc levels: No significant disc pathology seen within the thoracic spine. No other significant canal or foraminal stenosis at this time. IMPRESSION: 1. Abnormal marrow signal intensity throughout the thoracic spine, consistent with metastatic disease or myeloma. 2. Complete involvement of the T11 vertebral body with associated pathologic compression fracture, with 5 mm of retropulsed bone and/or tumor protruding into the spinal canal. Resultant mild spinal stenosis without frank cord compression or cord signal changes. 3. Additional prominent tumor involvement  involving the right posterior elements of T5 with associated adjacent paraspinous edema. 4. No other significant epidural or extraosseous tumor identified. No significant disc pathology or stenosis. Electronically Signed   By: Jeannine Boga M.D.   On: 02/12/2020 22:52   MR LUMBAR SPINE WO CONTRAST  Result Date: 02/11/2020 CLINICAL DATA:  Low back pain with difficulty walking, persistently worsening. EXAM: MRI LUMBAR SPINE WITHOUT CONTRAST TECHNIQUE: Multiplanar, multisequence MR imaging of the lumbar spine was performed. No intravenous contrast was administered. COMPARISON:  None. FINDINGS: Segmentation:  5 lumbar type vertebral bodies. Alignment:  Normal Vertebrae: Metastatic disease or myeloma throughout the region with involvement each vertebral level from inferior T11 through S1. Extensive confluent disease replacing much of the marrow. Minor superior endplate deformity at L1 which could be at a fracture related to the underlying tumor or could be chronic. Extraosseous tumor extension at the S1 level involving the ventral epidural space. Lesion also seen within the right iliac bone. At the barely visible inferior T11 level, it appears that tumor bulges into the anterior aspect of the spinal canal. This was not studied completely. There is some potential that this could affect the distal thoracic cord. Conus medullaris and cauda equina: Conus extends to the T12-L1 level. Conus and cauda equina appear normal. Paraspinal and other soft tissues: Negative. No visible retroperitoneal adenopathy. Question focal aneurysm of the infrarenal abdominal aorta measuring up to 3.5 cm. This was not fully evaluated. Disc levels: No disc pathology at L2-3 or above. L3-4: Mild bulging of the disc.  No compressive stenosis. L5-S1: Chronic fusion across this disc space. Sufficient patency of the canal and foramina. As noted above, there is ventral epidural bulging of tumor at the S1 level but this does not cause visible  neural compression.  L4-5: Disc bulge. Mild facet and ligamentous hypertrophy. Mild bilateral lateral recess stenosis. 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 Bulging of tumor into the ventral spinal canal at the inferior T11 level, barely visible on this study. Complete spinal imaging recommended. Bulging of tumor at the S1 level, encroaching upon the ventral epidural space but without gross neural compression. Electronically Signed   By: Nelson Chimes M.D.   On: 02/11/2020 19:36   NM Pulmonary Perfusion  Result Date: 02/11/2020 CLINICAL DATA:  PE suspected, high prob Shortness of breath.  Weakness. EXAM: NUCLEAR MEDICINE PERFUSION LUNG SCAN TECHNIQUE: Perfusion images were obtained in multiple projections after intravenous injection of radiopharmaceutical. Ventilation scans intentionally deferred if perfusion scan and chest x-ray adequate for interpretation during COVID 19 epidemic. RADIOPHARMACEUTICALS:  3.8 mCi Tc-66m MAA IV COMPARISON:  Chest radiograph earlier this day. FINDINGS: Lung perfusion is diffusely heterogeneous. There are no discrete wedge-shaped perfusion defects. IMPRESSION: Diffusely heterogeneous lung perfusion without discrete wedge-shaped perfusion defect. No scintigraphic evidence of pulmonary embolus. Electronically Signed   By: Keith Rake M.D.   On: 02/11/2020 16:50   US RENAL  Result Date: 02/11/2020 CLINICAL DATA:  Acute kidney injury EXAM: RENAL / URINARY TRACT ULTRASOUND COMPLETE COMPARISON:  Ultrasound 09/18/2017 FINDINGS: Right Kidney: Renal measurements: 9.1 by 5 x 5.2 cm = volume: 125 mL. Cortical echogenicity within normal limits. No hydronephrosis. 4.2 x 2.9 x 4.2 cm parapelvic cyst, previously 4.3 cm. Left Kidney: Renal measurements: 8.8 x 4.6 x 4.7 cm = volume: 100 mL. Echogenicity within normal limits. No mass or hydronephrosis visualized. Bladder: Appears normal for degree of bladder  distention. Other: Bladder volume of 473 mL. Patient unable to void to get postvoid residual. IMPRESSION: 1. Negative for hydronephrosis. 2. Similar 4.3 cm right parapelvic cyst Electronically Signed   By: Donavan Foil M.D.   On: 02/11/2020 16:30   IR Bone Tumor(s)RF Ablation  Result Date: 02/20/2020 INDICATION: 74 year old male with a history of metastatic adenocarcinoma, symptomatic pathologic fracture of T11, presents 4 radiofrequency ablation and vertebral augmentation EXAM: IMAGE GUIDED RADIOFREQUENCY ABLATION OF T11 PATHOLOGIC FRACTURE BIPEDICULAR APPROACH KYPHOPLASTY T11 MEDICATIONS: As antibiotic prophylaxis, 2 G ANCEF was ordered pre-procedure and administered intravenously within 1 hour of incision. ANESTHESIA/SEDATION: Moderate (conscious) sedation was employed during this procedure. A total of Versed 3.0 mg and Fentanyl 200 mcg was administered intravenously. Moderate Sedation Time: 47 minutes. The patient's level of consciousness and vital signs were monitored continuously by radiology nursing throughout the procedure under my direct supervision. FLUOROSCOPY TIME:  Fluoroscopy Time: 6 minutes 30 seconds (191 mGy) COMPLICATIONS: NONE PROCEDURE: Following a full explanation of the procedure along with the potentially associated complications, a witnessed informed consent was obtained. Specific risks that were discussed included bleeding, infection, injury to adjacent structures, neurologic injury, embolization of cement within the veins, failure of the procedure to improve pain, need for further procedure/ surgery, cardiopulmonary collapse, death. The patient understands the risks and wishes to proceed. The patient was placed prone on the fluoroscopic table. Nasal oxygen was administered. Physiologic monitoring was performed throughout the duration of the procedure. The skin overlying the T11 region was prepped and draped in the usual sterile fashion. The T11 vertebral body was identified and the  RIGHT pedicle was infiltrated with 1% lidocaine. This was then followed by the advancement of Medtronic Osteocool Trocar needle through the RIGHT pedicle into the posterior one-third. The left pedicle was then infiltrated with 1% lidocaine. A  small incision was made with 11 blade scalpel, and then a Medtronic Osteocool Trocar needle was advanced through the left pedicle into the posterior 1/3 of the vertebral body. The bone drill/measuring tool was then advanced through the bilateral pedicles to estimate length of the required Osteocool ablation device. 15 mm device selected both pedicle. Once position was confirmed, we initiated the treatment. Upon the first initiation, the left-sided pedicle RF antenna demonstrated a malfunction. After troubleshooting with the Medtronic representative, we replaced the left probe. Successful initiation was then achieved, and ablation was performed for total of 11 minutes 30 second, bilateral. The RFA ablation devices were then removed. Balloon cannula were advanced bilaterally. Simultaneous inflation of the left and right pedicular cannula balloons was performed under fluoroscopic observation. Methylmethacrylate mixture was then reconstituted. Under biplane intermittent fluoroscopy, the methylmethacrylate was then injected into both the cavity and the interstices of vertebral body with filling of the space. No extravasation was noted posteriorly into the spinal canal. No epidural venous contamination was seen. Trace contamination of the T10-T11 disc space. The needles were then removed. Hemostasis was achieved at the skin entry site. There were no acute complications. Patient tolerated the procedure well. The patient was observed for 30 minutes and returned to room in good condition. IMPRESSION: Status post image guided T11 RF ablation and bipedicular approach kyphoplasty for symptomatic pathologic fracture. Signed, Dulcy Fanny. Dellia Nims, RPVI Vascular and Interventional Radiology  Specialists [facility] Radiology Electronically Signed   By: Corrie Mckusick D.O.   On: 02/20/2020 10:16   CT BIOPSY  Result Date: 02/16/2020 INDICATION: 74 year old male with multifocal lytic osseous lesions concerning for metastatic disease. Primary malignancy is uncertain at this time. EXAM: CT-guided bone biopsy MEDICATIONS: None. ANESTHESIA/SEDATION: Moderate (conscious) sedation was employed during this procedure. A total of Versed 2 mg and Fentanyl 100 mcg was administered intravenously. Moderate Sedation Time: 13 minutes. The patient's level of consciousness and vital signs were monitored continuously by radiology nursing throughout the procedure under my direct supervision. FLUOROSCOPY TIME:  None COMPLICATIONS: None immediate. PROCEDURE: Informed written consent was obtained from the patient after a thorough discussion of the procedural risks, benefits and alternatives. All questions were addressed. Maximal Sterile Barrier Technique was utilized including caps, mask, sterile gowns, sterile gloves, sterile drape, hand hygiene and skin antiseptic. A timeout was performed prior to the initiation of the procedure. The patient was placed in the prone position. A planning axial CT imaging was obtained. The soft tissue mass emanating from the posterior aspect of the right fifth rib and adjacent T5 vertebral body was successfully identified. A suitable skin entry site was selected and marked. Sterile prep and drape was then performed using chlorhexidine skin prep. Local anesthesia was attained by infiltration with 1% lidocaine. A small dermatotomy was made. Under intermittent CT guidance, a 17 gauge introducer needle was advanced into the margin of the mass. Multiple 18 gauge core biopsies were then obtained using the bio Pince automated biopsy device. Biopsy specimens were placed in formalin and delivered to pathology for further analysis. The biopsy device was removed. Hemostasis was attained by gentle manual  pressure. The patient tolerated the procedure well. IMPRESSION: Technically successful CT-guided core biopsy of T5 paraspinal mass. Electronically Signed   By: Jacqulynn Cadet M.D.   On: 02/16/2020 13:53   IR KYPHO EA ADDL LEVEL THORACIC OR LUMBAR  Result Date: 02/20/2020 INDICATION: 74 year old male with a history of metastatic adenocarcinoma, symptomatic pathologic fracture of T11, presents 4 radiofrequency ablation and vertebral augmentation EXAM:  IMAGE GUIDED RADIOFREQUENCY ABLATION OF T11 PATHOLOGIC FRACTURE BIPEDICULAR APPROACH KYPHOPLASTY T11 MEDICATIONS: As antibiotic prophylaxis, 2 G ANCEF was ordered pre-procedure and administered intravenously within 1 hour of incision. ANESTHESIA/SEDATION: Moderate (conscious) sedation was employed during this procedure. A total of Versed 3.0 mg and Fentanyl 200 mcg was administered intravenously. Moderate Sedation Time: 47 minutes. The patient's level of consciousness and vital signs were monitored continuously by radiology nursing throughout the procedure under my direct supervision. FLUOROSCOPY TIME:  Fluoroscopy Time: 6 minutes 30 seconds (412 mGy) COMPLICATIONS: NONE PROCEDURE: Following a full explanation of the procedure along with the potentially associated complications, a witnessed informed consent was obtained. Specific risks that were discussed included bleeding, infection, injury to adjacent structures, neurologic injury, embolization of cement within the veins, failure of the procedure to improve pain, need for further procedure/ surgery, cardiopulmonary collapse, death. The patient understands the risks and wishes to proceed. The patient was placed prone on the fluoroscopic table. Nasal oxygen was administered. Physiologic monitoring was performed throughout the duration of the procedure. The skin overlying the T11 region was prepped and draped in the usual sterile fashion. The T11 vertebral body was identified and the RIGHT pedicle was infiltrated  with 1% lidocaine. This was then followed by the advancement of Medtronic Osteocool Trocar needle through the RIGHT pedicle into the posterior one-third. The left pedicle was then infiltrated with 1% lidocaine. A small incision was made with 11 blade scalpel, and then a Medtronic Osteocool Trocar needle was advanced through the left pedicle into the posterior 1/3 of the vertebral body. The bone drill/measuring tool was then advanced through the bilateral pedicles to estimate length of the required Osteocool ablation device. 15 mm device selected both pedicle. Once position was confirmed, we initiated the treatment. Upon the first initiation, the left-sided pedicle RF antenna demonstrated a malfunction. After troubleshooting with the Medtronic representative, we replaced the left probe. Successful initiation was then achieved, and ablation was performed for total of 11 minutes 30 second, bilateral. The RFA ablation devices were then removed. Balloon cannula were advanced bilaterally. Simultaneous inflation of the left and right pedicular cannula balloons was performed under fluoroscopic observation. Methylmethacrylate mixture was then reconstituted. Under biplane intermittent fluoroscopy, the methylmethacrylate was then injected into both the cavity and the interstices of vertebral body with filling of the space. No extravasation was noted posteriorly into the spinal canal. No epidural venous contamination was seen. Trace contamination of the T10-T11 disc space. The needles were then removed. Hemostasis was achieved at the skin entry site. There were no acute complications. Patient tolerated the procedure well. The patient was observed for 30 minutes and returned to room in good condition. IMPRESSION: Status post image guided T11 RF ablation and bipedicular approach kyphoplasty for symptomatic pathologic fracture. Signed, Dulcy Fanny. Dellia Nims, RPVI Vascular and Interventional Radiology Specialists [facility] Radiology  Electronically Signed   By: Corrie Mckusick D.O.   On: 02/20/2020 10:16   VAS [place] LOWER EXTREMITY VENOUS (DVT)  Result Date: 02/14/2020  Lower Venous DVTStudy Indications: Elevated ddimer.  Comparison Study: no prior Performing Technologist: Abram Sander RVS  Examination Guidelines: A complete evaluation includes B-mode imaging, spectral Doppler, color Doppler, and power Doppler as needed of all accessible portions of each vessel. Bilateral testing is considered an integral part of a complete examination. Limited examinations for reoccurring indications may be performed as noted. The reflux portion of the exam is performed with the patient in reverse Trendelenburg.  +---------+---------------+---------+-----------+----------+--------------+ RIGHT    CompressibilityPhasicitySpontaneityPropertiesThrombus Aging +---------+---------------+---------+-----------+----------+--------------+ CFV  Full           Yes      Yes                                 +---------+---------------+---------+-----------+----------+--------------+ SFJ      Full                                                        +---------+---------------+---------+-----------+----------+--------------+ FV Prox  Full                                                        +---------+---------------+---------+-----------+----------+--------------+ FV Mid   Full                                                        +---------+---------------+---------+-----------+----------+--------------+ FV DistalFull                                                        +---------+---------------+---------+-----------+----------+--------------+ PFV      Full                                                        +---------+---------------+---------+-----------+----------+--------------+ POP      Full           Yes      Yes                                  +---------+---------------+---------+-----------+----------+--------------+ PTV      Full                                                        +---------+---------------+---------+-----------+----------+--------------+ PERO     Full                                                        +---------+---------------+---------+-----------+----------+--------------+   +---------+---------------+---------+-----------+----------+--------------+ LEFT     CompressibilityPhasicitySpontaneityPropertiesThrombus Aging +---------+---------------+---------+-----------+----------+--------------+ CFV      Full           Yes      Yes                                 +---------+---------------+---------+-----------+----------+--------------+  SFJ      Full                                                        +---------+---------------+---------+-----------+----------+--------------+ FV Prox  Full                                                        +---------+---------------+---------+-----------+----------+--------------+ FV Mid   Full                                                        +---------+---------------+---------+-----------+----------+--------------+ FV DistalFull                                                        +---------+---------------+---------+-----------+----------+--------------+ PFV      Full                                                        +---------+---------------+---------+-----------+----------+--------------+ POP      Full           Yes      Yes                                 +---------+---------------+---------+-----------+----------+--------------+ PTV      Full                                                        +---------+---------------+---------+-----------+----------+--------------+ PERO     Full                                                         +---------+---------------+---------+-----------+----------+--------------+     Summary: BILATERAL: - No evidence of deep vein thrombosis seen in the lower extremities, bilaterally. - No evidence of superficial venous thrombosis in the lower extremities, bilaterally. -   *See table(s) above for measurements and observations. Electronically signed by Deitra Mayo MD on 02/14/2020 at 4:18:52 AM.    Final      The results of significant diagnostics from this hospitalization (including imaging, microbiology, ancillary and laboratory) are listed below for reference.     Microbiology: Recent Results (from the past 240 hour(s))  SARS Coronavirus 2 by RT PCR (hospital order, performed in [facility] hospital lab) Nasopharyngeal Nasopharyngeal Swab  Status: None   Collection Time: 02/11/20  1:24 PM   Specimen: Nasopharyngeal Swab  Result Value Ref Range Status   SARS Coronavirus 2 NEGATIVE NEGATIVE Final    Comment: (NOTE) SARS-CoV-2 target nucleic acids are NOT DETECTED.  The SARS-CoV-2 RNA is generally detectable in upper and lower respiratory specimens during the acute phase of infection. The lowest concentration of SARS-CoV-2 viral copies this assay can detect is 250 copies / mL. A negative result does not preclude SARS-CoV-2 infection and should not be used as the sole basis for treatment or other patient management decisions.  A negative result may occur with improper specimen collection / handling, submission of specimen other than nasopharyngeal swab, presence of viral mutation(s) within the areas targeted by this assay, and inadequate number of viral copies (<250 copies / mL). A negative result must be combined with clinical observations, patient history, and epidemiological information.  Fact Sheet for Patients:   StrictlyIdeas.no  Fact Sheet for Healthcare Providers: BankingDealers.co.za  This test is not yet approved or   cleared by the [place] FDA and has been authorized for detection and/or diagnosis of SARS-CoV-2 by FDA under an Emergency Use Authorization (EUA).  This EUA will remain in effect (meaning this test can be used) for the duration of the COVID-19 declaration under Section 564(b)(1) of the Act, 21 U.S.C. section 360bbb-3(b)(1), unless the authorization is terminated or revoked sooner.  Performed at [facility], [place] [address] [place], [place] [address]   Urine culture     Status: Abnormal   Collection Time: 02/11/20  8:30 PM   Specimen: Urine, Clean Catch  Result Value Ref Range Status   Specimen Description   Final    URINE, CLEAN CATCH Performed at [facility] [place] [address], [place], [place] [address]    Special Requests   Final    NONE Performed at [facility], [place] [address], [place], [place] [address]    Culture (A)  Final    <10,000 COLONIES/mL INSIGNIFICANT GROWTH Performed at [place] [address], [place], [place] [address]    Report Status 02/12/2020 FINAL  Final     Labs: BNP (last 3 results) Recent Labs    02/11/20 1249  BNP 70.0   Basic Metabolic Panel: Recent Labs  Lab 02/16/20 0534 02/18/20 0538  NA 143  --   K 4.2  --   CL 106  --   CO2 26  --   GLUCOSE 117*  --   BUN 41*  --   CREATININE 1.00 0.94  CALCIUM 9.5  --    Liver Function Tests: Recent Labs  Lab 02/16/20 0534  AST 73*  ALT 48*  ALKPHOS 89  BILITOT 0.6  PROT 6.2*  ALBUMIN 3.1*   No results for input(s): LIPASE, AMYLASE in the last 168 hours. No results for input(s): AMMONIA in the last 168 hours. CBC: Recent Labs  Lab 02/16/20 0534 02/16/20 1928 02/20/20 0446  WBC 11.7* 11.5* 13.9*  HGB 11.8* 12.2* 11.2*  HCT 37.7* 39.5 36.2*  MCV 94.3 96.1 95.8  PLT 351 322 249   Cardiac Enzymes: No results for input(s): CKTOTAL, CKMB, CKMBINDEX, TROPONINI in the last 168 hours. BNP: Invalid input(s):  POCBNP CBG: No results for input(s): GLUCAP in the last 168 hours. D-Dimer No results for input(s): DDIMER in the last 72 hours. Hgb A1c No results for input(s): HGBA1C in the last 72 hours. Lipid Profile No results for input(s):  CHOL, HDL, LDLCALC, TRIG, CHOLHDL, LDLDIRECT in the last 72 hours. Thyroid function studies No results for input(s): TSH, T4TOTAL, T3FREE, THYROIDAB in the last 72 hours.  Invalid input(s): FREET3 Anemia work up Recent Labs    02/19/20 1343  VITAMINB12 1,218*  FOLATE 5.3*  FERRITIN 1,866*  TIBC 250  IRON 54  RETICCTPCT 1.5   Urinalysis    Component Value Date/Time   COLORURINE STRAW (A) 02/11/2020 2030   APPEARANCEUR CLEAR 02/11/2020 2030   LABSPEC 1.012 02/11/2020 2030   [place] 5.0 02/11/2020 2030   GLUCOSEU NEGATIVE 02/11/2020 2030   [place] (A) 02/11/2020 2030   [place] NEGATIVE 02/11/2020 2030   [place] 02/11/2020 2030   PROTEINUR NEGATIVE 02/11/2020 2030   NITRITE NEGATIVE 02/11/2020 2030   LEUKOCYTESUR NEGATIVE 02/11/2020 2030   Sepsis Labs Invalid input(s): PROCALCITONIN,  WBC,  LACTICIDVEN Microbiology Recent Results (from the past 240 hour(s))  SARS Coronavirus 2 by RT PCR (hospital order, performed in [place] hospital lab) Nasopharyngeal Nasopharyngeal Swab     Status: None   Collection Time: 02/11/20  1:24 PM   Specimen: Nasopharyngeal Swab  Result Value Ref Range Status   SARS Coronavirus 2 NEGATIVE NEGATIVE Final    Comment: (NOTE) SARS-CoV-2 target nucleic acids are NOT DETECTED.  The SARS-CoV-2 RNA is generally detectable in upper and lower respiratory specimens during the acute phase of infection. The lowest concentration of SARS-CoV-2 viral copies this assay can detect is 250 copies / mL. A negative result does not preclude SARS-CoV-2 infection and should not be used as the sole basis for treatment or other patient management decisions.  A negative result may occur with improper specimen  collection / handling, submission of specimen other than nasopharyngeal swab, presence of viral mutation(s) within the areas targeted by this assay, and inadequate number of viral copies (<250 copies / mL). A negative result must be combined with clinical observations, patient history, and epidemiological information.  Fact Sheet for Patients:   StrictlyIdeas.no  Fact Sheet for Healthcare Providers: BankingDealers.co.za  This test is not yet approved or  cleared by the [place] FDA and has been authorized for detection and/or diagnosis of SARS-CoV-2 by FDA under an Emergency Use Authorization (EUA).  This EUA will remain in effect (meaning this test can be used) for the duration of the COVID-19 declaration under Section 564(b)(1) of the Act, 21 U.S.C. section 360bbb-3(b)(1), unless the authorization is terminated or revoked sooner.  Performed at [facility], [place] [address] [place], [place] [address]   Urine culture     Status: Abnormal   Collection Time: 02/11/20  8:30 PM   Specimen: Urine, Clean Catch  Result Value Ref Range Status   Specimen Description   Final    URINE, CLEAN CATCH Performed at [facility], [place] [address], [place], [place] [address]    Special Requests   Final    NONE Performed at [facility], [place] [address] [place], [place] [address]    Culture (A)  Final    <10,000 COLONIES/mL INSIGNIFICANT GROWTH Performed at [place] [address], [place], [place] [address]    Report Status 02/12/2020 FINAL  Final     Time coordinating discharge in minutes: 65  SIGNED:   Debbe Odea, MD  [facility] 02/21/2020, 12:04 PM

## 2020-02-21 NOTE — Progress Notes (Signed)
Referring Physician(s): Dr. Wynelle Cleveland  Supervising Physician: Markus Daft  Patient Status:  [facility] - In-pt  Chief Complaint:  Back pain  Brief History:  74 yo male admitted with hypotension, back pain, weakness. 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 Lung likely primary source.   He underwent treatment of symptomatic T11 pathologic fracture, with osteocool/RFA, and KP, via bipedicular approach by Dr. Earleen Newport yesterday.  Subjective:  Lying in bed. States his back still hurts some.   Allergies: Patient has no known allergies.  Medications: Prior to Admission medications   Medication Sig Start Date End Date Taking? Authorizing Provider  albuterol (PROVENTIL HFA;VENTOLIN HFA) 108 (90 Base) MCG/ACT inhaler Inhale 2 puffs into the lungs every 4 (four) hours as needed for wheezing or shortness of breath. 09/21/17  Yes Shahmehdi, Seyed A, MD  albuterol (PROVENTIL) (2.5 MG/3ML) 0.083% nebulizer solution Take 3 mLs (2.5 mg total) by nebulization 3 (three) times daily. Patient taking differently: Take 2.5 mg by nebulization every 6 (six) hours as needed for wheezing.  09/05/18  Yes Mannam, Praveen, MD  atorvastatin (LIPITOR) 80 MG tablet Take 1 tablet (80 mg total) by mouth daily at 6 PM. 12/16/19  Yes Abonza, Maritza, PA-C  furosemide (LASIX) 20 MG tablet Take 0.5 tablets (10 mg total) by mouth daily. 12/16/19  Yes Abonza, Maritza, PA-C  metoprolol succinate (TOPROL-XL) 50 MG 24 hr tablet Take 1 tablet (50 mg total) by mouth daily. 12/16/19  Yes Abonza, Maritza, PA-C  Olmesartan-amLODIPine-HCTZ 20-5-12.5 MG TABS TAKE 1 TABLET BY MOUTH DAILY. 12/16/19  Yes Abonza, Maritza, PA-C  TRELEGY ELLIPTA 100-62.5-25 MCG/INH AEPB INHALE 1 PUFF INTO THE LUNGS DAILY. NEED APPOINTMENT FOR FURTHER REFILLS Patient taking differently: Inhale 1 puff into the lungs daily.  12/09/19  Yes Mannam, Praveen, MD  acetaminophen (TYLENOL) 325 MG tablet Take 2 tablets (650 mg total) by mouth every 6 (six) hours  as needed for mild pain (or Fever >/= 101). 02/21/20   Debbe Odea, MD  amLODipine (NORVASC) 10 MG tablet Take 1 tablet (10 mg total) by mouth daily. 02/22/20   Debbe Odea, MD  dexamethasone (DECADRON) 4 MG tablet Take 4 mg BID x 2 days, then 4 mg daily x 2 days, then 2 mg daily x 2 days,  then 1 mg daily x 2 days,  then 0.5 mg daily x 2 days then stop 02/21/20   Debbe Odea, MD  feeding supplement, ENSURE ENLIVE, (ENSURE ENLIVE) LIQD Take 237 mLs by mouth 3 (three) times daily between meals. 02/21/20   Debbe Odea, MD  oxyCODONE (ROXICODONE) 5 MG immediate release tablet Take 1 tablet (5 mg total) by mouth every 6 (six) hours as needed for severe pain. 02/21/20 02/20/21  Debbe Odea, MD  senna-docusate (SENOKOT-S) 8.6-50 MG tablet Take 1 tablet by mouth at bedtime as needed for mild constipation. 02/21/20   Debbe Odea, MD     Vital Signs: BP 108/76 (BP Location: Right Arm)   Pulse 84   Temp 97.6 F (36.4 C) (Oral)   Resp 18   Ht 6' (1.829 m)   Wt 78.5 kg   SpO2 100%   BMI 23.46 kg/m   Physical Exam Constitutional:      Appearance: Normal appearance.  HENT:     Head: Normocephalic and atraumatic.  Cardiovascular:     Rate and Rhythm: Normal rate.  Pulmonary:     Effort: Pulmonary effort is normal. No respiratory distress.  Abdominal:     Palpations: Abdomen is soft.  Tenderness: There is no abdominal tenderness.  Musculoskeletal:     Comments: Access sites on the back look good. No bleeding. Non-tender to palpation  Skin:    General: Skin is warm and dry.  Neurological:     General: No focal deficit present.     Mental Status: He is alert and oriented to person, place, and time.  Psychiatric:        Mood and Affect: Mood normal.        Behavior: Behavior normal.        Thought Content: Thought content normal.        Judgment: Judgment normal.     Imaging: MR BRAIN W WO CONTRAST  Result Date: 02/19/2020 CLINICAL DATA:  Non-small cell lung cancer, staging.  EXAM: MRI HEAD WITHOUT AND WITH CONTRAST TECHNIQUE: Multiplanar, multiecho pulse sequences of the brain and surrounding structures were obtained without and with intravenous contrast. CONTRAST:  38mL GADAVIST GADOBUTROL 1 MMOL/ML IV SOLN COMPARISON:  Head CT 09/17/2017. FINDINGS: Brain: Stable, moderate generalized parenchymal atrophy. No abnormal enhancement is demonstrated to suggest intracranial metastatic disease. There is a small chronic cortically based infarct within the right occipital lobe. Mild scattered T2/FLAIR hyperintensity within the cerebral white matter and pons is nonspecific, but consistent with chronic small vessel ischemic disease. There is no acute infarct. No evidence of intracranial mass. No chronic intracranial blood products. No extra-axial fluid collection. No midline shift. Vascular: Expected proximal arterial flow voids. Skull and upper cervical spine: No focal marrow lesion. Sinuses/Orbits: Visualized orbits show no acute finding. Tiny right maxillary sinus mucous retention cysts. No significant mastoid effusion. IMPRESSION: No evidence of intracranial metastatic disease. 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 Moderate generalized parenchymal atrophy with mild chronic small vessel ischemic disease. Tiny right maxillary sinus mucous retention cysts. Electronically Signed   By: Kellie Simmering DO   On: 02/19/2020 11:48   IR Bone Tumor(s)RF Ablation  Result Date: 02/20/2020 INDICATION: 74 year old male with a history of metastatic adenocarcinoma, symptomatic pathologic fracture of T11, presents 4 radiofrequency ablation and vertebral augmentation EXAM: IMAGE GUIDED RADIOFREQUENCY ABLATION OF T11 PATHOLOGIC FRACTURE BIPEDICULAR APPROACH KYPHOPLASTY T11 MEDICATIONS: As antibiotic prophylaxis, 2 G ANCEF was ordered pre-procedure and administered intravenously within 1 hour of incision. ANESTHESIA/SEDATION: Moderate (conscious) sedation was employed during this procedure.  A total of Versed 3.0 mg and Fentanyl 200 mcg was administered intravenously. Moderate Sedation Time: 47 minutes. The patient's level of consciousness and vital signs were monitored continuously by radiology nursing throughout the procedure under my direct supervision. FLUOROSCOPY TIME:  Fluoroscopy Time: 6 minutes 30 seconds (283 mGy) COMPLICATIONS: NONE PROCEDURE: Following a full explanation of the procedure along with the potentially associated complications, a witnessed informed consent was obtained. Specific risks that were discussed included bleeding, infection, injury to adjacent structures, neurologic injury, embolization of cement within the veins, failure of the procedure to improve pain, need for further procedure/ surgery, cardiopulmonary collapse, death. The patient understands the risks and wishes to proceed. The patient was placed prone on the fluoroscopic table. Nasal oxygen was administered. Physiologic monitoring was performed throughout the duration of the procedure. The skin overlying the T11 region was prepped and draped in the usual sterile fashion. The T11 vertebral body was identified and the RIGHT pedicle was infiltrated with 1% lidocaine. This was then followed by the advancement of Medtronic Osteocool Trocar needle through the RIGHT pedicle into the posterior one-third. The left pedicle was then infiltrated with 1% lidocaine. A small incision was made with  11 blade scalpel, and then a Medtronic Osteocool Trocar needle was advanced through the left pedicle into the posterior 1/3 of the vertebral body. The bone drill/measuring tool was then advanced through the bilateral pedicles to estimate length of the required Osteocool ablation device. 15 mm device selected both pedicle. Once position was confirmed, we initiated the treatment. Upon the first initiation, the left-sided pedicle RF antenna demonstrated a malfunction. After troubleshooting with the Medtronic representative, we replaced the  left probe. Successful initiation was then achieved, and ablation was performed for total of 11 minutes 30 second, bilateral. The RFA ablation devices were then removed. Balloon cannula were advanced bilaterally. Simultaneous inflation of the left and right pedicular cannula balloons was performed under fluoroscopic observation. Methylmethacrylate mixture was then reconstituted. Under biplane intermittent fluoroscopy, the methylmethacrylate was then injected into both the cavity and the interstices of vertebral body with filling of the space. No extravasation was noted posteriorly into the spinal canal. No epidural venous contamination was seen. Trace contamination of the T10-T11 disc space. The needles were then removed. Hemostasis was achieved at the skin entry site. There were no acute complications. Patient tolerated the procedure well. The patient was observed for 30 minutes and returned to room in good condition. IMPRESSION: Status post image guided T11 RF ablation and bipedicular approach kyphoplasty for symptomatic pathologic fracture. Signed, Dulcy Fanny. Dellia Nims, RPVI [facility] Electronically Signed   By: Corrie Mckusick D.O.   On: 02/20/2020 10:16   IR KYPHO EA ADDL LEVEL THORACIC OR LUMBAR  Result Date: 02/20/2020 INDICATION: 74 year old male with a history of metastatic adenocarcinoma, symptomatic pathologic fracture of T11, presents 4 radiofrequency ablation and vertebral augmentation EXAM: IMAGE GUIDED RADIOFREQUENCY ABLATION OF T11 PATHOLOGIC FRACTURE BIPEDICULAR APPROACH KYPHOPLASTY T11 MEDICATIONS: As antibiotic prophylaxis, 2 G ANCEF was ordered pre-procedure and administered intravenously within 1 hour of incision. ANESTHESIA/SEDATION: Moderate (conscious) sedation was employed during this procedure. A total of Versed 3.0 mg and Fentanyl 200 mcg was administered intravenously. Moderate Sedation Time: 47 minutes. The patient's level of  consciousness and vital signs were monitored continuously by radiology nursing throughout the procedure under my direct supervision. FLUOROSCOPY TIME:  Fluoroscopy Time: 6 minutes 30 seconds (332 mGy) COMPLICATIONS: NONE PROCEDURE: Following a full explanation of the procedure along with the potentially associated complications, a witnessed informed consent was obtained. Specific risks that were discussed included bleeding, infection, injury to adjacent structures, neurologic injury, embolization of cement within the veins, failure of the procedure to improve pain, need for further procedure/ surgery, cardiopulmonary collapse, death. The patient understands the risks and wishes to proceed. The patient was placed prone on the fluoroscopic table. Nasal oxygen was administered. Physiologic monitoring was performed throughout the duration of the procedure. The skin overlying the T11 region was prepped and draped in the usual sterile fashion. The T11 vertebral body was identified and the RIGHT pedicle was infiltrated with 1% lidocaine. This was then followed by the advancement of Medtronic Osteocool Trocar needle through the RIGHT pedicle into the posterior one-third. The left pedicle was then infiltrated with 1% lidocaine. A small incision was made with 11 blade scalpel, and then a Medtronic Osteocool Trocar needle was advanced through the left pedicle into the posterior 1/3 of the vertebral body. The bone drill/measuring tool was then advanced through the bilateral pedicles to estimate length of the required Osteocool ablation device. 15 mm device selected both pedicle. Once position was confirmed, we initiated the treatment. Upon the first initiation, the  left-sided pedicle RF antenna demonstrated a malfunction. After troubleshooting with the [employer] representative, we replaced the left probe. Successful initiation was then achieved, and ablation was performed for total of 11 minutes 30 second, bilateral. The RFA  ablation devices were then removed. Balloon cannula were advanced bilaterally. Simultaneous inflation of the left and right pedicular cannula balloons was performed under fluoroscopic observation. Methylmethacrylate mixture was then reconstituted. Under biplane intermittent fluoroscopy, the methylmethacrylate was then injected into both the cavity and the interstices of vertebral body with filling of the space. No extravasation was noted posteriorly into the spinal canal. No epidural venous contamination was seen. Trace contamination of the T10-T11 disc space. The needles were then removed. Hemostasis was achieved at the skin entry site. There were no acute complications. Patient tolerated the procedure well. The patient was observed for 30 minutes and returned to room in good condition. IMPRESSION: Status post image guided T11 RF ablation and bipedicular approach kyphoplasty for symptomatic pathologic fracture. Signed, Dulcy Fanny. Dellia Nims, RPVI [facility] Radiology Electronically Signed   By: Corrie Mckusick D.O.   On: 02/20/2020 10:16    Labs:  CBC: Recent Labs    02/13/20 1032 02/16/20 0534 02/16/20 1928 02/20/20 0446  WBC 9.8 11.7* 11.5* 13.9*  HGB 12.2* 11.8* 12.2* 11.2*  HCT 38.9* 37.7* 39.5 36.2*  PLT 298 351 322 249    COAGS: Recent Labs    02/20/20 0446  INR 1.1    BMP: Recent Labs    02/11/20 1249 02/11/20 1249 02/12/20 0528 02/13/20 0530 02/13/20 1032 02/16/20 0534 02/18/20 0538  NA 141  --  143  --  140 143  --   K 4.6  --  5.0  --  4.5 4.2  --   CL 96*  --  105  --  100 106  --   CO2 25  --  30  --  24 26  --   GLUCOSE 150*  --  107*  --  105* 117*  --   BUN 99*  --  71*  --  32* 41*  --   CALCIUM 11.8*   < > 10.8* 10.5* 10.3 9.5  --   CREATININE 2.52*   < > 1.50*  --  1.07 1.00 0.94  GFRNONAA 24*   < > 45*  --  >60 >60 >60  GFRAA 28*   < > 52*  --  >60 >60 >60   < > = values in this interval not displayed.     LIVER FUNCTION TESTS: Recent Labs    02/11/20 1249 02/12/20 0528 02/16/20 0534  BILITOT 0.8 0.5 0.6  AST 41 37 73*  ALT 25 23 48*  ALKPHOS 115 101 89  PROT 8.0 6.8 6.2*  ALBUMIN 3.7 3.3* 3.1*    Assessment and Plan:  Metastases to spine and T11 compression fx. Lung likely primary source.   S/P reatment of symptomatic T11 pathologic fracture, with osteocool/RFA, and KP, via bipedicular approach by Dr. Earleen Newport yesterday.  Ok to ambulate as tolerated.  Will arrange follow up with Dr. Earleen Newport. Office will call patient with appointment details.  Electronically Signed: Murrell Redden, PA-C 02/21/2020, 1:03 PM    I spent a total of 15 Minutes at the the patient's bedside AND on the patient's hospital floor or unit, greater than 50% of which was counseling/coordinating care for f/u KP and osteocool.

## 2020-02-22 DIAGNOSIS — R52 Pain, unspecified: Secondary | ICD-10-CM

## 2020-02-22 NOTE — Progress Notes (Signed)
Daily Progress Note   Patient Name: George Frazier       Date: 02/22/2020 DOB: 07/30/1945  Age: 74 y.o. MRN#: [id number] Attending Physician: Debbe Odea, MD Primary Care Physician: Patient, No Pcp Per Admit Date: 02/11/2020  Reason for Consultation/Follow-up: Establishing goals of care  Subjective:  patient is awake alert resting in bed Took 2 doses of Oxy IR PO PRN for his back pain.  We discussed about his overall goals of care and preferred disposition options. Appetite is fair See below.   Length of Stay: 11  Current Medications: Scheduled Meds:   amLODipine  10 mg Oral Daily   atorvastatin  80 mg Oral q1800   dexamethasone (DECADRON) injection  10 mg Intravenous Q12H   enoxaparin (LOVENOX) injection  40 mg Subcutaneous Q24H   feeding supplement (ENSURE ENLIVE)  237 mL Oral TID BM   fluticasone furoate-vilanterol  1 puff Inhalation Daily   And   umeclidinium bromide  1 puff Inhalation Daily    Continuous Infusions:   PRN Meds: acetaminophen **OR** acetaminophen, albuterol, hydrALAZINE, HYDROmorphone (DILAUDID) injection, iohexol, ondansetron **OR** ondansetron (ZOFRAN) IV, oxyCODONE, senna-docusate  Physical Exam         Awake alert No distress mild back pain Regular work of breathing S1 S2 Abdomen not distended No edema Non focal   Vital Signs: BP 138/82 (BP Location: Left Arm)    Pulse 82    Temp 98.4 F (36.9 C) (Oral)    Resp 17    Ht 6' (1.829 m)    Wt 78.5 kg    SpO2 95%    BMI 23.46 kg/m  SpO2: SpO2: 95 % O2 Device: O2 Device: Nasal Cannula O2 Flow Rate: O2 Flow Rate (L/min): 3 L/min  Intake/output summary:   Intake/Output Summary (Last 24 hours) at 02/22/2020 0052 Last data filed at 02/22/2020 0600 Gross per 24 hour  Intake 480 ml  Output  1075 ml  Net -595 ml   LBM: Last BM Date: 02/17/20 Baseline Weight: Weight: 78.5 kg Most recent weight: Weight: 78.5 kg       Palliative Assessment/Data:    Flowsheet Rows     Most Recent Value  Intake Tab  Referral Department Hospitalist  Unit at Time of Referral Med/Surg Unit  Palliative Care Primary Diagnosis Cancer  Date Notified 02/12/20  Date  of Admission 02/11/20  Date first seen by Palliative Care 02/14/20  # of days Palliative referral response time 2 Day(s)  # of days IP prior to Palliative referral 1  Clinical Assessment  Palliative Performance Scale Score 40%  Psychosocial & Spiritual Assessment  Palliative Care Outcomes  Patient/Family meeting held? Yes  Who was at the meeting? patient, daughter, nephew      Patient Active Problem List   Diagnosis Date Noted   Malignant neoplasm metastatic to bone [facility])    Dehydration    Pressure injury of skin 02/12/2020   AKI (acute kidney injury) ([place]) 02/11/2020   AAA (abdominal aortic aneurysm) without rupture ([place]) 09/15/2019   H/O noncompliance with medical treatment, presenting hazards to health 09/04/2019   History of prediabetes-  A1c 6.1 in December 2018 12/23/2018   Solitary pulmonary nodule- R middle lobe- followed by pulm 08/20/2018   Accelerated hypertension 08/08/2018   Hyperlipidemia 12/13/2017   Vitamin D insufficiency 12/13/2017   Physical deconditioning 11/15/2017   Hypertension 11/06/2017   Stopped smoking 2008 with greater than 40 pack year history 11/06/2017   Chronic fatigue 11/06/2017   Dyspnea on minimal exertion-likely due to COPD 11/06/2017   Chronic respiratory failure with hypoxia ([place]) 09/17/2017   CAP (community acquired pneumonia) 07/14/2017   COPD, group D, by GOLD 2017 classification ([place]) 07/14/2017   Heart failure ([place]) 07/14/2017    Palliative Care Assessment & Plan   Patient Profile:  74 year old male with prior history of COPD on home oxygen, lung  nodule, hypertension, chronic diastolic heart failure presents to ED for generalized weakness and decreased appetite  Assessment:  generalized weakness, back pain, decreased appetite Constipation Hyper calcemia HTN Solitary pulmonary nodule T 5 para spinal mass biopsy, results pending.   Recommendations/Plan: Med history noted, discussed with patient. He is on Decadron, Tylenol and Dilaudid PRN. Continue PO Oxy IR PRN.  Goals of care discussions: Chart was reviewed.  Events from 8-7 noted.  Met with patient at bedside.  He is doing fairly well this morning.  He has some back pain for which he is asking for by mouth oxycodone.  I did discuss with him that it is available as frequently as every 3 hours if needed.  Goals wishes and values important to the patient rediscussed.  The patient wants to be home.  The patient is accepting of hospice philosophy of care and hospice level support at home.  He met with hospice liaison yesterday and states that arrangements are being made for delivery of medical equipment.  He already has 2 L supplemental oxygen via nasal cannula available at home.  Currently he is on 3 L here. I reviewed with him about recommendations from an oncology perspective.  Additional testing is pending.  Patient states that he does not want further follow-up from oncology.  He wishes to be home and he wishes to be comfortable in his familiar surroundings.  He states that he is fully aware that his time on the start is limited and that he is okay with it. Reached out to [facility] [place] liaison who stated that arrangements are being made for delivery of medical equipment as well as enrollment in their home hospice services. Offered active listening and supportive care to the patient.  Continue with current pain and nonpain symptom management measures. PPS 40%  Code Status:    Code Status Orders  (From admission, onward)         Start     Ordered  02/11/20 1710  Do not  attempt resuscitation (DNR)  Continuous       Question Answer Comment  In the event of cardiac or respiratory ARREST Do not call a code blue   In the event of cardiac or respiratory ARREST Do not perform Intubation, CPR, defibrillation or ACLS   In the event of cardiac or respiratory ARREST Use medication by any route, position, wound care, and other measures to relive pain and suffering. May use oxygen, suction and manual treatment of airway obstruction as needed for comfort.      02/11/20 1713        Code Status History    Date Active Date Inactive Code Status Order ID Comments User Context   08/08/2018 2207 08/10/2018 1708 Full Code [id number]  Bonnielee Haff, MD Inpatient   09/17/2017 1847 09/21/2017 1626 Full Code [id number]  Evans Lance, MD ED   07/14/2017 2049 07/18/2017 1847 DNR [id number]  Karmen Bongo, MD Inpatient   Advance Care Planning Activity       Prognosis:   guarded, ? Less than 3 months.   Discharge Planning:  Home with hospice as per patient preference.      Care plan was discussed with  Patient.   Thank you for allowing the Palliative Medicine Team to assist in the care of this patient.   Time In: 8.30 Time Out: 9.05 Total Time 35 Prolonged Time Billed No       Greater than 50%  of this time was spent counseling and coordinating care related to the above assessment and plan.  Loistine Chance, MD  Please contact Palliative Medicine Team phone at [phone] for questions and concerns.

## 2020-02-23 NOTE — Progress Notes (Signed)
Discharge instructions given to patient and placed in packet and all questions were answered.

## 2020-02-23 NOTE — Progress Notes (Signed)
Daily Progress Note   Patient Name: George Frazier       Date: 02/23/2020 DOB: 1946/01/28  Age: 74 y.o. MRN#: [id number] Attending Physician: Debbe Odea, MD Primary Care Physician: Patient, No Pcp Per Admit Date: 02/11/2020  Reason for Consultation/Follow-up: Establishing goals of care  Subjective:  patient is awake alert resting in bed Back  Pain is well controlled this am, anticipating discharge home with hospice soon, discussed with hospice liaison on 8-8.    Length of Stay: 12  Current Medications: Scheduled Meds:   amLODipine  10 mg Oral Daily   atorvastatin  80 mg Oral q1800   dexamethasone (DECADRON) injection  10 mg Intravenous Q12H   enoxaparin (LOVENOX) injection  40 mg Subcutaneous Q24H   feeding supplement (ENSURE ENLIVE)  237 mL Oral TID BM   fluticasone furoate-vilanterol  1 puff Inhalation Daily   And   umeclidinium bromide  1 puff Inhalation Daily    Continuous Infusions:   PRN Meds: acetaminophen **OR** acetaminophen, albuterol, hydrALAZINE, HYDROmorphone (DILAUDID) injection, iohexol, ondansetron **OR** ondansetron (ZOFRAN) IV, oxyCODONE, senna-docusate  Physical Exam         Awake alert No distress mild back pain Regular work of breathing S1 S2 Abdomen not distended No edema Non focal   Vital Signs: BP (!) 142/94 (BP Location: Left Arm)    Pulse 88    Temp 98 F (36.7 C)    Resp 20    Ht 6' (1.829 m)    Wt 78.5 kg    SpO2 91%    BMI 23.46 kg/m  SpO2: SpO2: 91 % O2 Device: O2 Device: Nasal Cannula O2 Flow Rate: O2 Flow Rate (L/min): 3 L/min  Intake/output summary:   Intake/Output Summary (Last 24 hours) at 02/23/2020 1056 Last data filed at 02/23/2020 1053 Gross per 24 hour  Intake 420 ml  Output 1200 ml  Net -780 ml   LBM: Last BM  Date: 02/17/20 Baseline Weight: Weight: 78.5 kg Most recent weight: Weight: 78.5 kg       Palliative Assessment/Data:    Flowsheet Rows     Most Recent Value  Intake Tab  Referral Department Hospitalist  Unit at Time of Referral Med/Surg Unit  Palliative Care Primary Diagnosis Cancer  Date Notified 02/12/20  Date of Admission 02/11/20  Date first seen by Palliative  Care 02/14/20  # of days Palliative referral response time 2 Day(s)  # of days IP prior to Palliative referral 1  Clinical Assessment  Palliative Performance Scale Score 40%  Psychosocial & Spiritual Assessment  Palliative Care Outcomes  Patient/Family meeting held? Yes  Who was at the meeting? patient, daughter, nephew      Patient Active Problem List   Diagnosis Date Noted   Malignant neoplasm metastatic to bone [facility])    Dehydration    Pressure injury of skin 02/12/2020   AKI (acute kidney injury) ([place]) 02/11/2020   AAA (abdominal aortic aneurysm) without rupture ([place]) 09/15/2019   H/O noncompliance with medical treatment, presenting hazards to health 09/04/2019   History of prediabetes-  A1c 6.1 in December 2018 12/23/2018   Solitary pulmonary nodule- R middle lobe- followed by pulm 08/20/2018   Accelerated hypertension 08/08/2018   Hyperlipidemia 12/13/2017   Vitamin D insufficiency 12/13/2017   Physical deconditioning 11/15/2017   Hypertension 11/06/2017   Stopped smoking 2008 with greater than 40 pack year history 11/06/2017   Chronic fatigue 11/06/2017   Dyspnea on minimal exertion-likely due to COPD 11/06/2017   Chronic respiratory failure with hypoxia ([place]) 09/17/2017   CAP (community acquired pneumonia) 07/14/2017   COPD, group D, by GOLD 2017 classification ([place]) 07/14/2017   Heart failure ([place]) 07/14/2017    Palliative Care Assessment & Plan   Patient Profile:  74 year old male with prior history of COPD on home oxygen, lung nodule, hypertension, chronic diastolic  heart failure presents to ED for generalized weakness and decreased appetite  Assessment:  generalized weakness, back pain, decreased appetite Constipation Hyper calcemia HTN Solitary pulmonary nodule T 5 para spinal mass biopsy, results pending.   Recommendations/Plan: Home with hospice support Oxy IR PO PRN and other non pain regimen to continue Prognosis likely not more than few weeks at this point, in my opinion.  Patient wishes for symptom control and to be in his home, does not want SNF rehab attempt, does not want med onc out patient follow up. He is accepting of hospice staff coming into his home, his daughter and other family members will be able to assist in his care.   PPS 40%  Code Status:    Code Status Orders  (From admission, onward)         Start     Ordered   02/11/20 1710  Do not attempt resuscitation (DNR)  Continuous       Question Answer Comment  In the event of cardiac or respiratory ARREST Do not call a code blue   In the event of cardiac or respiratory ARREST Do not perform Intubation, CPR, defibrillation or ACLS   In the event of cardiac or respiratory ARREST Use medication by any route, position, wound care, and other measures to relive pain and suffering. May use oxygen, suction and manual treatment of airway obstruction as needed for comfort.      02/11/20 1713        Code Status History    Date Active Date Inactive Code Status Order ID Comments User Context   08/08/2018 2207 08/10/2018 1708 Full Code [id number]  Bonnielee Haff, MD Inpatient   09/17/2017 1847 09/21/2017 1626 Full Code [id number]  Evans Lance, MD ED   07/14/2017 2049 07/18/2017 1847 DNR [id number]  Karmen Bongo, MD Inpatient   Advance Care Planning Activity       Prognosis:   guarded, ? Few weeks  Discharge Planning:  Home with hospice as per patient  preference.      Care plan was discussed with  Patient.   Thank you for allowing the Palliative Medicine Team to  assist in the care of this patient.   Time In: 9 Time Out: 9.15 Total Time 15 Prolonged Time Billed No       Greater than 50%  of this time was spent counseling and coordinating care related to the above assessment and plan.  Loistine Chance, MD  Please contact Palliative Medicine Team phone at [phone] for questions and concerns.

## 2020-02-23 NOTE — Care Management Important Message (Signed)
Important Message  Patient Details IM Letter given to the Patient Name: George Frazier MRN: [id number] Date of Birth: 26-May-1946   Medicare Important Message Given:  Yes     Kerin Salen 02/23/2020, 9:04 AM

## 2020-02-23 NOTE — Progress Notes (Signed)
TRH  Patient examined today. He is stable to d/c home today with hospice.  Debbe Odea, MD

## 2020-02-23 NOTE — TOC Transition Note (Signed)
Transition of Care [facility]) - CM/SW Discharge Note   Patient Details  Name: George Frazier MRN: [id number] Date of Birth: 07-05-1946  Transition of Care [facility]) CM/SW Contact:  Lennart Pall, LCSW Phone Number: 02/23/2020, 11:09 AM   Clinical Narrative:     Pt and daughter confirm plans for d/c home today under care of [facility].  All arrangements in place.  [facility] to transport pt home.  No further TOC needs.  Final next level of care: Home w Hospice Care Barriers to Discharge: Barriers Resolved   Patient Goals and CMS Choice Patient states their goals for this hospitalization and ongoing recovery are:: Pt anxious to receive biopsy report.  He would prefer to d/c home, however, aware may need SNF      Discharge Placement                       Discharge Plan and Services In-house Referral: Clinical Social Work              DME Arranged: 3-N-1, Overbed table, Wheelchair manual, Oxygen, Hospital bed DME Agency: [employer] Date DME Agency Contacted:  (arranged via [place])     [place] Arranged: RN [facility] Agency: [place] Date [place]: 02/21/20      Social Determinants of Health ([place]) Interventions     Readmission Risk Interventions Readmission Risk Prevention Plan 02/23/2020  Transportation Screening Complete  PCP or Specialist Appt within 5-7 Days Complete  Home Care Screening Complete  Medication Review (RN CM) Complete

## 2020-02-25 ENCOUNTER — Encounter: Payer: Self-pay | Admitting: *Deleted

## 2020-02-25 NOTE — Progress Notes (Signed)
Per Dr. Lorenso Courier, he requested to cancel [facility]. I called [facility] and was updated this was not sent yet. I updated them to cancel request due to patient going to Hospice.

## 2020-03-01 ENCOUNTER — Other Ambulatory Visit: Payer: Self-pay | Admitting: *Deleted

## 2020-03-01 NOTE — Patient Outreach (Signed)
[facility]) Care Management  03/01/2020  Mayjor Ager 1945/11/18 [id number]  Telephone outreach, [place] ON EMMI CALL - no appt.  Chart review that Mr. Arvie was discharged under the care of [facility].  Called and spoke to a family member who reports the nurse is currently there, services have begun.  No further outreach needed by [place] Management.  Eulah Pont. Myrtie Neither, MSN, [facility] Gerontological Nurse Practitioner [facility] Care Management [phone]

## 2020-03-17 DEATH — deceased
